# Patient Record
Sex: Male | Born: 1980 | Race: White | Hispanic: No | Marital: Married | Smoking: Never smoker
Health system: Southern US, Community
[De-identification: ages and names within clinical notes are randomized; demographics above are authoritative.]

## PROBLEM LIST (undated history)

## (undated) DIAGNOSIS — E079 Disorder of thyroid, unspecified: Secondary | ICD-10-CM

## (undated) DIAGNOSIS — C9201 Acute myeloblastic leukemia, in remission: Secondary | ICD-10-CM

## (undated) DIAGNOSIS — F419 Anxiety disorder, unspecified: Secondary | ICD-10-CM

## (undated) DIAGNOSIS — Z5189 Encounter for other specified aftercare: Secondary | ICD-10-CM

## (undated) DIAGNOSIS — E039 Hypothyroidism, unspecified: Secondary | ICD-10-CM

## (undated) HISTORY — DX: Acute myeloblastic leukemia, in remission: C92.01

## (undated) HISTORY — DX: Anxiety disorder, unspecified: F41.9

## (undated) HISTORY — PX: PORT-A-CATH REMOVAL: SHX5289

## (undated) HISTORY — DX: Disorder of thyroid, unspecified: E07.9

## (undated) HISTORY — PX: WISDOM TOOTH EXTRACTION: SHX21

## (undated) HISTORY — DX: Encounter for other specified aftercare: Z51.89

## (undated) HISTORY — PX: CYSTECTOMY: SUR359

---

## 2008-11-25 DIAGNOSIS — Z5189 Encounter for other specified aftercare: Secondary | ICD-10-CM

## 2008-11-25 DIAGNOSIS — C9201 Acute myeloblastic leukemia, in remission: Secondary | ICD-10-CM

## 2008-11-25 HISTORY — DX: Encounter for other specified aftercare: Z51.89

## 2008-11-25 HISTORY — DX: Acute myeloblastic leukemia, in remission: C92.01

## 2011-10-14 DIAGNOSIS — E538 Deficiency of other specified B group vitamins: Secondary | ICD-10-CM | POA: Insufficient documentation

## 2015-05-16 DIAGNOSIS — E039 Hypothyroidism, unspecified: Secondary | ICD-10-CM | POA: Insufficient documentation

## 2015-05-16 DIAGNOSIS — C9201 Acute myeloblastic leukemia, in remission: Secondary | ICD-10-CM | POA: Insufficient documentation

## 2015-05-16 LAB — BASIC METABOLIC PANEL
BUN: 161 — AB (ref 4–21)
GLUCOSE: 85
POTASSIUM: 4.4 (ref 3.4–5.3)
SODIUM: 140 (ref 137–147)

## 2015-05-16 LAB — CBC AND DIFFERENTIAL
HCT: 51 (ref 41–53)
Hemoglobin: 16.7 (ref 13.5–17.5)
Neutrophils Absolute: 3
PLATELETS: 178 (ref 150–399)
WBC: 6.4

## 2015-05-16 LAB — HEPATIC FUNCTION PANEL
ALT: 18 (ref 10–40)
AST: 19 (ref 14–40)
Alkaline Phosphatase: 59 (ref 25–125)
Bilirubin, Total: 0.6

## 2015-05-16 LAB — TSH: TSH: 5.11 (ref 0.41–5.90)

## 2015-06-05 LAB — VITAMIN D 25 HYDROXY (VIT D DEFICIENCY, FRACTURES): VIT D 25 HYDROXY: 24.1

## 2016-12-02 LAB — VITAMIN B12: VITAMIN B 12: 739

## 2016-12-02 LAB — CBC AND DIFFERENTIAL
HCT: 48 (ref 41–53)
HEMOGLOBIN: 16.8 (ref 13.5–17.5)
NEUTROS ABS: 2
PLATELETS: 189 (ref 150–399)
WBC: 5

## 2016-12-02 LAB — BASIC METABOLIC PANEL
BUN: 15 (ref 4–21)
Creatinine: 0.8 (ref 0.6–1.3)
POTASSIUM: 4.4 (ref 3.4–5.3)
Sodium: 145 (ref 137–147)

## 2016-12-02 LAB — HEPATIC FUNCTION PANEL
ALT: 26 (ref 10–40)
AST: 23 (ref 14–40)
Alkaline Phosphatase: 63 (ref 25–125)
Bilirubin, Total: 0.5

## 2016-12-02 LAB — TSH: TSH: 8.06 — AB (ref 0.41–5.90)

## 2017-05-22 ENCOUNTER — Ambulatory Visit (INDEPENDENT_AMBULATORY_CARE_PROVIDER_SITE_OTHER): Payer: BLUE CROSS/BLUE SHIELD | Admitting: Physician Assistant

## 2017-05-22 ENCOUNTER — Encounter: Payer: Self-pay | Admitting: Physician Assistant

## 2017-05-22 VITALS — BP 118/80 | HR 63 | Temp 98.4°F | Ht 67.5 in | Wt 206.2 lb

## 2017-05-22 DIAGNOSIS — G8929 Other chronic pain: Secondary | ICD-10-CM | POA: Diagnosis not present

## 2017-05-22 DIAGNOSIS — R1012 Left upper quadrant pain: Secondary | ICD-10-CM | POA: Diagnosis not present

## 2017-05-22 LAB — COMPREHENSIVE METABOLIC PANEL
ALT: 16 U/L (ref 0–53)
AST: 16 U/L (ref 0–37)
Albumin: 4.7 g/dL (ref 3.5–5.2)
Alkaline Phosphatase: 59 U/L (ref 39–117)
BUN: 15 mg/dL (ref 6–23)
CHLORIDE: 102 meq/L (ref 96–112)
CO2: 29 meq/L (ref 19–32)
Calcium: 10 mg/dL (ref 8.4–10.5)
Creatinine, Ser: 0.88 mg/dL (ref 0.40–1.50)
GFR: 104.74 mL/min (ref >60.00)
Glucose, Bld: 95 mg/dL (ref 70–99)
POTASSIUM: 3.7 meq/L (ref 3.5–5.1)
Sodium: 140 mEq/L (ref 135–145)
Total Bilirubin: 0.7 mg/dL (ref 0.2–1.2)
Total Protein: 7.2 g/dL (ref 6.0–8.3)

## 2017-05-22 LAB — CBC WITH DIFFERENTIAL/PLATELET
Basophils Absolute: 0 10*3/uL (ref 0.0–0.1)
Basophils Relative: 0.8 % (ref 0.0–3.0)
EOS PCT: 0.9 % (ref 0.0–5.0)
Eosinophils Absolute: 0.1 10*3/uL (ref 0.0–0.7)
HCT: 48.2 % (ref 39.0–52.0)
Hemoglobin: 16.8 g/dL (ref 13.0–17.0)
LYMPHS ABS: 2 10*3/uL (ref 0.7–4.0)
Lymphocytes Relative: 34.6 % (ref 12.0–46.0)
MCHC: 34.9 g/dL (ref 30.0–36.0)
MCV: 91.7 fl (ref 78.0–100.0)
MONOS PCT: 12.1 % — AB (ref 3.0–12.0)
Monocytes Absolute: 0.7 10*3/uL (ref 0.1–1.0)
NEUTROS ABS: 3 10*3/uL (ref 1.4–7.7)
NEUTROS PCT: 51.6 % (ref 43.0–77.0)
PLATELETS: 207 10*3/uL (ref 150.0–400.0)
RBC: 5.25 Mil/uL (ref 4.22–5.81)
RDW: 12.7 % (ref 11.5–15.5)
WBC: 5.7 10*3/uL (ref 4.0–10.5)

## 2017-05-22 LAB — SEDIMENTATION RATE: Sed Rate: 1 mm/hr (ref 0–15)

## 2017-05-22 LAB — C-REACTIVE PROTEIN: CRP: 0.4 mg/dL — ABNORMAL LOW (ref 0.5–20.0)

## 2017-05-22 NOTE — Progress Notes (Signed)
Zachary Little is a 36 y.o. male here to [place] and complaining of pain LLQ x 4 months.  I acted as a Education administrator for [employer], PA-C Anselmo Pickler, LPN   History of Present Illness:   Chief Complaint  Patient presents with  . Establish Care    BC/BS  . Pain LUQ    radiates to the back, x 4 months    [name] is here to establish care and discuss his LUQ pain. 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  Acute Concerns: LUQ pain -- states that his pain is "purely positional", he states that he if lays on his L side or reclines to a certain degree he has pain to his lower L rib cage and in his LUQ. The pain radiates to his back at times. Normal bowel movements, denies issues with constipation or diarrhea. No history of liver or gallbladder issues. No issues with eating or severe pain after meals, denies nausea. Denies any issues with urination, fevers or or night sweats. Current wt is 206 lb, has been jogging to lose weight and has lost about 11 lb so far this year -- getting married in September. He does not use alcohol or do any illicit drugs.  Of note, he has been seen by multiple providers for this including: Dr. Kennon Holter with [facility] and Lanier Prude PA-C at [facility]. He first noticed this pain when he was driving, and of note, he is L-handed. Denies numbness, tingling, bowel/bladder incontinence, saddle anesthesia. Work-up this far includes: negative CT of the abdomen and pelvis without contrast on 01/17/17. Thoracic spine x-rays on 03/27/17 was negative for acute issue, does show scoliosis at T5. He was ultimately diagnosed with thoracic radiculopathy and started on Meloxicam. He was told to follow-up in about 4 weeks from last visit which was on 04/15/17, and given that his pain was in a primarily dermatomal patterns, his other providers were considering an MRI of the thoracic spine.    He is here for another opinion. Most recent blood work per his report was in January 2018, prior to onset of pain.  Wt Readings from Last 3 Encounters:  05/22/17 206 lb 4 oz (93.6 kg)   Chronic Issues: Meralgia paresthetica of R side -- s/p treatment with right LFC block as well as PT. Has had both a cervical and lumbar MRI in August of 2017. 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 No stenosis or disc degeneration.   Health Maintenance: Weight -- Weight: 206 lb 4 oz (93.6 kg)   No flowsheet data found.  GAD 7 : Generalized Anxiety Score 05/22/2017  Nervous, Anxious, on Edge 0  Control/stop worrying 0  Worry too much - different things 0  Trouble relaxing 0  Restless 0  Easily annoyed or irritable 0  Afraid - awful might happen 0  Total GAD 7 Score 0  Anxiety Difficulty Not difficult at all   Other providers/specialists: Dr. Park Breed and Nani Skillern, PA-C -- [facility] Dr. Butch Penny -- [facility] Dr. Lucia Gaskins -- Oncology  Past Medical History:  Diagnosis Date  . Anxiety   . Blood transfusion without reported diagnosis 2010   During Chemo Treatments  . Leukemia, acute myeloid, in remission ([place]) 2010  . Thyroid disease      Social History   Social History  .  Marital status: Single    Spouse name: N/A  . Number of children: N/A  . Years of education: N/A   Occupational History  . Not on file.   Social History Main Topics  . Smoking status: Never Smoker  . Smokeless tobacco: Never Used  . Alcohol use No  . Drug use: No  . Sexual activity: Yes    Partners: Female   Other Topics Concern  . 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 No pertinent surgical history.  Family History  Problem Relation Age of Onset  . Diabetes Maternal Grandfather   . Diabetes Paternal  Grandfather     No Known Allergies   Current Medications:   Current Outpatient Prescriptions:  .  levothyroxine (SYNTHROID, LEVOTHROID) 200 MCG tablet, Take 200 mcg by mouth daily. , Disp: , Rfl:    Review of Systems:   Review of Systems  Constitutional: Negative for chills, fever, malaise/fatigue and weight loss.  HENT: Negative for hearing loss, sinus pain and sore throat.   Eyes: Negative for blurred vision.  Respiratory: Negative for cough and shortness of breath.   Cardiovascular: Negative for chest pain, palpitations and leg swelling.  Gastrointestinal: Negative for abdominal pain, constipation, diarrhea, heartburn, nausea and vomiting.  Genitourinary: Negative for dysuria, frequency and urgency.  Musculoskeletal: Positive for myalgias. Negative for back pain and neck pain.       LUQ radiates to the back, x 4 months.  Skin: Negative for itching and rash.  Neurological: Negative for dizziness, tingling, seizures, loss of consciousness and headaches.  Endo/Heme/Allergies: Negative for polydipsia.  Psychiatric/Behavioral: Negative for depression. The patient is not nervous/anxious.     Vitals:   Vitals:   05/22/17 1337  BP: 118/80  Pulse: 63  Temp: 98.4 F (36.9 C)  TempSrc: Oral  SpO2: 98%  Weight: 206 lb 4 oz (93.6 kg)  Height: 5' 7.5" (1.715 m)     Body mass index is 31.83 kg/m.  Physical Exam:   Physical Exam  Constitutional: He appears well-developed. He is cooperative.  Non-toxic appearance. He does not have a sickly appearance. He does not appear ill. No distress.  Cardiovascular: Normal rate, regular rhythm, S1 normal, S2 normal, normal heart sounds and normal pulses.   No LE edema  Pulmonary/Chest: Effort normal and breath sounds normal.  Abdominal: Normal appearance and bowel sounds are normal. There is no splenomegaly. There is no tenderness. There is no rigidity, no rebound, no guarding, no CVA tenderness and negative Murphy's sign.  Unable to  reproduce symptoms with deep palpation of LUQ  Musculoskeletal:       Thoracic back: He exhibits normal range of motion, no tenderness, no bony tenderness, no swelling, no edema, no deformity, no laceration, no pain and no spasm.  No rashes.  Neurological: He is alert.  Nursing note and vitals reviewed.  Results for orders placed or performed in visit on 05/22/17  CBC with Differential/Platelet  Result Value Ref Range   WBC 5.7 4.0 - 10.5 K/uL   RBC 5.25 4.22 - 5.81 Mil/uL   Hemoglobin 16.8 13.0 - 17.0 g/dL   HCT 48.2 39.0 - 52.0 %   MCV 91.7 78.0 - 100.0 fl   MCHC 34.9 30.0 - 36.0 g/dL   RDW 12.7 11.5 - 15.5 %   Platelets 207.0 150.0 - 400.0 K/uL  Neutrophils Relative % 51.6 43.0 - 77.0 %   Lymphocytes Relative 34.6 12.0 - 46.0 %   Monocytes Relative 12.1 (H) 3.0 - 12.0 %   Eosinophils Relative 0.9 0.0 - 5.0 %   Basophils Relative 0.8 0.0 - 3.0 %   Neutro Abs 3.0 1.4 - 7.7 K/uL   Lymphs Abs 2.0 0.7 - 4.0 K/uL   Monocytes Absolute 0.7 0.1 - 1.0 K/uL   Eosinophils Absolute 0.1 0.0 - 0.7 K/uL   Basophils Absolute 0.0 0.0 - 0.1 K/uL  Comprehensive metabolic panel  Result Value Ref Range   Sodium 140 135 - 145 mEq/L   Potassium 3.7 3.5 - 5.1 mEq/L   Chloride 102 96 - 112 mEq/L   CO2 29 19 - 32 mEq/L   Glucose, Bld 95 70 - 99 mg/dL   BUN 15 6 - 23 mg/dL   Creatinine, Ser 0.88 0.40 - 1.50 mg/dL   Total Bilirubin 0.7 0.2 - 1.2 mg/dL   Alkaline Phosphatase 59 39 - 117 U/L   AST 16 0 - 37 U/L   ALT 16 0 - 53 U/L   Total Protein 7.2 6.0 - 8.3 g/dL   Albumin 4.7 3.5 - 5.2 g/dL   Calcium 10.0 8.4 - 10.5 mg/dL   GFR 104.74 >60.00 mL/min  Sedimentation rate  Result Value Ref Range   Sed Rate 1 0 - 15 mm/hr  C-reactive protein  Result Value Ref Range   CRP 0.4 (L) 0.5 - 20.0 mg/dL    Assessment and Plan:    Thunder was seen today for establish care and pain luq.  Diagnoses and all orders for this visit:  Chronic LUQ pain Patient has been diagnosed by prior providers with  thoracic radiculopathy, however he is seeking a second opinion. I reviewed patient's records extensively and discussed case with Dr. Teresa Coombs in sports medicine here at our office. Given all of the imaging that has been completed so far, as well as patient requesting a referral to neurology for a second opinion on his meralgia paresthetica of his R side, I am going to send him to neurology for further evaluation. Most recent provider, Dr. [place] Desanctis, recommended the next step possibly be an MRI of thoracic spine. I will hold off on this for now and defer to neurology. I called patient after he left and after I reviewed his records to discuss this plan with patient, to which he is agreeable. I also reviewed his lab results with him and told him that they were normal. Patient appreciative of information and referral has been placed. -     CBC with Differential/Platelet -     Comprehensive metabolic panel -     Sedimentation rate -     C-reactive protein -     Pathologist smear review    . Reviewed expectations re: course of current medical issues. . Discussed self-management of symptoms. . Outlined signs and symptoms indicating need for more acute intervention. . Patient verbalized understanding and all questions were answered. . See orders for this visit as documented in the electronic medical record. . Patient received an After-Visit Summary.  CMA or LPN served as scribe during this visit. History, Physical, and Plan performed by medical provider. Documentation and orders reviewed and attested to.  [name], PA-C

## 2017-05-22 NOTE — Patient Instructions (Addendum)
It was great meeting you!  You will be contacted about your referral to neurology and to schedule your ultrasound. We will also contact you about your lab results.   Abdominal Pain, Adult Abdominal pain can be caused by many things. Often, abdominal pain is not serious and it gets better with no treatment or by being treated at home. However, sometimes abdominal pain is serious. Your health care provider will do a medical history and a physical exam to try to determine the cause of your abdominal pain. Follow these instructions at home:  Take over-the-counter and prescription medicines only as told by your health care provider. Do not take a laxative unless told by your health care provider.  Drink enough fluid to keep your urine clear or pale yellow.  Watch your condition for any changes.  Keep all follow-up visits as told by your health care provider. This is important. Contact a health care provider if:  Your abdominal pain changes or gets worse.  You are not hungry or you lose weight without trying.  You are constipated or have diarrhea for more than 2-3 days.  You have pain when you urinate or have a bowel movement.  Your abdominal pain wakes you up at night.  Your pain gets worse with meals, after eating, or with certain foods.  You are throwing up and cannot keep anything down.  You have a fever. Get help right away if:  Your pain does not go away as soon as your health care provider told you to expect.  You cannot stop throwing up.  Your pain is only in areas of the abdomen, such as the right side or the left lower portion of the abdomen.  You have bloody or black stools, or stools that look like tar.  You have severe pain, cramping, or bloating in your abdomen.  You have signs of dehydration, such as: ? Dark urine, very little urine, or no urine. ? Cracked lips. ? Dry mouth. ? Sunken eyes. ? Sleepiness. ? Weakness. This information is not intended to  replace advice given to you by your health care provider. Make sure you discuss any questions you have with your health care provider. Document Released: 08/21/2005 Document Revised: 05/31/2016 Document Reviewed: 04/24/2016 Elsevier Interactive Patient Education  2017 [employer].

## 2017-05-23 ENCOUNTER — Other Ambulatory Visit: Payer: Self-pay | Admitting: Physician Assistant

## 2017-05-23 DIAGNOSIS — G5711 Meralgia paresthetica, right lower limb: Secondary | ICD-10-CM | POA: Insufficient documentation

## 2017-05-23 DIAGNOSIS — M5414 Radiculopathy, thoracic region: Secondary | ICD-10-CM | POA: Insufficient documentation

## 2017-05-23 LAB — PATHOLOGIST SMEAR REVIEW

## 2017-05-27 ENCOUNTER — Encounter: Payer: Self-pay | Admitting: Neurology

## 2017-06-04 ENCOUNTER — Telehealth: Payer: Self-pay | Admitting: Family Medicine

## 2017-06-04 NOTE — Telephone Encounter (Signed)
ROI fax to [facility]

## 2017-06-23 ENCOUNTER — Encounter: Payer: Self-pay | Admitting: Physician Assistant

## 2017-06-23 LAB — SEDIMENTATION RATE
ANA by IFA: 1:160 {titer}
Sed Rate: 7
Total CK: 75 (ref ?–195.0)

## 2017-06-23 LAB — T3, FREE
FREE T3: 2.83
Free T4: 1.45

## 2017-07-31 ENCOUNTER — Ambulatory Visit (INDEPENDENT_AMBULATORY_CARE_PROVIDER_SITE_OTHER): Payer: BLUE CROSS/BLUE SHIELD | Admitting: Neurology

## 2017-07-31 ENCOUNTER — Encounter: Payer: Self-pay | Admitting: Neurology

## 2017-07-31 VITALS — BP 100/58 | HR 83 | Ht 67.5 in | Wt 204.0 lb

## 2017-07-31 DIAGNOSIS — G8929 Other chronic pain: Secondary | ICD-10-CM | POA: Diagnosis not present

## 2017-07-31 DIAGNOSIS — M546 Pain in thoracic spine: Secondary | ICD-10-CM

## 2017-07-31 DIAGNOSIS — G5711 Meralgia paresthetica, right lower limb: Secondary | ICD-10-CM

## 2017-07-31 MED ORDER — GABAPENTIN 300 MG PO CAPS: 300.0000 mg | ORAL_CAPSULE | Freq: Three times a day (TID) | ORAL | 2 refills | Status: DC

## 2017-07-31 NOTE — Addendum Note (Signed)
Addended by: Akiah Bauch R on: 07/31/2017 04:00 PM   Modules accepted: Level of Service

## 2017-07-31 NOTE — Patient Instructions (Signed)
1.  Restart gabapentin 300mg :  Take 1 capsule at bedtime for 7 days,  Then 1 capsule twice daily for 7 days  Then 1 capsule three times daily Contact me at end of prescription and we can either increase dose or try a different medication.  We can also consider ordering MRI of thoracic spine. 2.  Follow up in 3 months.

## 2017-07-31 NOTE — Progress Notes (Signed)
NEUROLOGY CONSULTATION NOTE  Zachary Little MRN: [id number] DOB: Nov 22, 1981  Referring provider: [name], PA Primary care provider: [name], PA  Reason for consult:  Thoracic radiculopathy  HISTORY OF PRESENT ILLNESS: Zachary Little is a 36 year old left-handed male with hypothyroidism and history of AML in remission who presents for evaluation of right meralgia paresthetica and left thoracic radiculopathy.  History supplemented by PCP and prior specialists' notes.   In 2016, he developed numbness and tingling in the lateral aspect of his right thigh.  It is constant but he particularly notices it when he lays supine in bed or is standing on uneven ground.  He denies low back pain or radicular pain down the leg.  He denies weakness of the extremity.  He denies symptoms in the left leg.  He denies bladder incontinence or retention, although he has increased urgency.   X-ray of right thigh/femur from 06/05/15 was unremarkable.  Lower extremity venous doppler from 06/05/15 was negative for DVT.  Lumbar spine Xrays from 02/06/16 were negative.  Labs from 03/27/16 included TSH 4.680, free T4 1.46, B12 291, CK 66, vitamin D 56.6, A NCV-EMG from 06/24/16 was reportedly normal.  MRI of lumbar spine without contrast from 07/09/16 was normal. He was diagnosed with right sided meralgia paresthetica and underwent a right lateral femoral cutaneous nerve block on 01/30/17, which was ineffective.  He denies wearing tight fitting belts or pants at the waist.    In February, he developed sudden onset left sided thoracic pain involving the left flank and rib.  It was spontaneous and not preceded by any injury or strenuous activity.  It occurs from the medial lower border of the scapula and radiates around to above the inguinal region.  It is a moderate pain, but it is not a sharp, stabbing, electric or burning pain.  There is no associated paresthesias or rash.  It is noticeable when sitting at his  desk or driving in the car.  When he is feeling the pain, it can be intensified if he palpates below his rib cage.  Otherwise, it is not reproducible.    CT of abdomen and pelvis from 01/17/17 was unremarkable.  A thoracic spine and bilateral ribs/chest X-rays from 03/27/17 were unremarkable.    He was diagnosed with possible left sided thoracic radiculopathy.  Labs from 05/22/17 were unremarkable, including CBC, CMP, Sed rate of 1 and CRP 0.4.  PAST MEDICAL HISTORY: Past Medical History:  Diagnosis Date  . Anxiety   . Blood transfusion without reported diagnosis 2010   During Chemo Treatments  . Leukemia, acute myeloid, in remission ([place]) 2010  . Thyroid disease     PAST SURGICAL HISTORY: History reviewed. No pertinent surgical history.  MEDICATIONS: Current Outpatient Prescriptions on File Prior to Visit  Medication Sig Dispense Refill  . levothyroxine (SYNTHROID, LEVOTHROID) 200 MCG tablet Take 200 mcg by mouth daily.      No current facility-administered medications on file prior to visit.     ALLERGIES: No Known Allergies  FAMILY HISTORY: Family History  Problem Relation Age of Onset  . Diabetes Maternal Grandfather   . Diabetes Paternal Grandfather     SOCIAL HISTORY: Social History   Social History  . Marital status: Single    Spouse name: N/A  . Number of children: N/A  . Years of education: N/A   Occupational History  . Not on file.   Social History Main Topics  . Smoking status: Never Smoker  .  Smokeless tobacco: Never Used  . Alcohol use No  . Drug use: No  . Sexual activity: Yes    Partners: Female   Other Topics Concern  . 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   Married in September, no kids   No pets    REVIEW OF SYSTEMS: Constitutional: No fevers, chills, or sweats, no generalized fatigue, change in appetite Eyes: No visual changes, double vision, eye pain Ear, nose and throat: No hearing loss, ear  pain, nasal congestion, sore throat Cardiovascular: No chest pain, palpitations Respiratory:  No shortness of breath at rest or with exertion, wheezes GastrointestinaI: No nausea, vomiting, diarrhea, abdominal pain, fecal incontinence Genitourinary:  No dysuria, urinary retention or frequency Musculoskeletal:  No neck pain, back pain Integumentary: No rash, pruritus, skin lesions Neurological: as above Psychiatric: No depression, insomnia, anxiety Endocrine: No palpitations, fatigue, diaphoresis, mood swings, change in appetite, change in weight, increased thirst Hematologic/Lymphatic:  No purpura, petechiae. Allergic/Immunologic: no itchy/runny eyes, nasal congestion, recent allergic reactions, rashes  PHYSICAL EXAM: Vitals:   07/31/17 1453  BP: (!) 100/58  Pulse: 83   General: No acute distress.  Patient appears well-groomed.  Head:  Normocephalic/atraumatic Eyes:  fundi examined but not visualized Neck: supple, no paraspinal tenderness, full range of motion Back: No paraspinal tenderness Heart: regular rate and rhythm Lungs: Clear to auscultation bilaterally. Vascular: No carotid bruits. Neurological Exam: Mental status: alert and oriented to person, place, and time, recent and remote memory intact, fund of knowledge intact, attention and concentration intact, speech fluent and not dysarthric, language intact. Cranial nerves: CN I: not tested CN II: pupils equal, round and reactive to light, visual fields intact CN III, IV, VI:  full range of motion, no nystagmus, no ptosis CN V: facial sensation intact CN VII: upper and lower face symmetric CN VIII: hearing intact CN IX, X: gag intact, uvula midline CN XI: sternocleidomastoid and trapezius muscles intact CN XII: tongue midline Bulk & Tone: normal, no fasciculations. Motor:  5/5 throughout  Sensation:  Decreased pinprick sensation along the lateral aspect of the right thigh; vibration sensation intact.  . Deep Tendon  Reflexes:  2+ throughout, toes downgoing.  Finger to nose testing:  Without dysmetria.  Heel to shin:  Without dysmetria.  Gait:  Normal station and stride.  Able to turn and tandem walk. Romberg negative.  IMPRESSION: 1.  Right-sided meralgia paresthetica.  No structural cause identified on imaging.  Symptoms and imaging not suggestive of lumbar radiculopathy.  Treatment is symptomatic management as there is no correctable cause found on imaging. 2.  Left sided thoracic pain.  Pain description is vague.  T6 radiculopathy is possible, however X-ray was unremarkable.  PLAN: 1.  He tried gabapentin once before (300mg  three times daily), which caused side effects so he stopped after 2 weeks.  We will restart it at a lower dose and titrate up slowly from once at bedtime to three times daily. 2.  In 4 weeks, he will contact us with update and we can either further titrate if needed or switch to a different agent (Lyrica) and consider ordering MRI of thoracic spine to evaluate for structural etiology. 3.  Follow up in 2 to 3 months.  Thank you for allowing me to take part in the care of this patient.  Metta Clines, DO  CC:  [name], PA

## 2017-08-22 ENCOUNTER — Ambulatory Visit (INDEPENDENT_AMBULATORY_CARE_PROVIDER_SITE_OTHER): Payer: 59 | Admitting: Physician Assistant

## 2017-08-22 ENCOUNTER — Encounter: Payer: Self-pay | Admitting: Physician Assistant

## 2017-08-22 VITALS — BP 98/68 | HR 72 | Temp 98.1°F | Wt 208.0 lb

## 2017-08-22 DIAGNOSIS — S161XXA Strain of muscle, fascia and tendon at neck level, initial encounter: Secondary | ICD-10-CM

## 2017-08-22 DIAGNOSIS — Z23 Encounter for immunization: Secondary | ICD-10-CM

## 2017-08-22 MED ORDER — MELOXICAM 15 MG PO TABS: 15.0000 mg | ORAL_TABLET | Freq: Every day | ORAL | 0 refills | Status: DC

## 2017-08-22 NOTE — Patient Instructions (Signed)
Work on exercises that I have provided you.  Consider dry needling with physical therapy or an appointment with Dr. Paulla Fore here at [facility] for further evaluation if symptoms do not improve.

## 2017-08-22 NOTE — Progress Notes (Signed)
Zachary Little is a 36 y.o. male here for a new problem.   History of Present Illness:   Chief Complaint  Patient presents with  . Neck Pain    X1week     HPI   Neck pain -- patient reports that for the past 1 week he has had stiffness and soreness around his shoulders, that has been radiating up to his neck and sometimes causing headaches. The pain is most present when driving and after waking up. He works on a Psychiatric nurse day and doesn't have reproducible symptoms when at his computer. Denies changes in vision, severe headaches, weakness along one side of the body.  Has tried ibuprofen, already taking gabapentin TID.   Past Medical History:  Diagnosis Date  . Anxiety   . Blood transfusion without reported diagnosis 2010   During Chemo Treatments  . Leukemia, acute myeloid, in remission ([place]) 2010  . Thyroid disease      Social History   Social History  . Marital status: Single    Spouse name: N/A  . Number of children: N/A  . Years of education: N/A   Occupational History  . Not on file.   Social History Main Topics  . Smoking status: Never Smoker  . Smokeless tobacco: Never Used  . Alcohol use No  . Drug use: No  . Sexual activity: Yes    Partners: Female   Other Topics Concern  . 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  Family History  Problem Relation Age of Onset  . Diabetes Maternal Grandfather   . Diabetes Paternal Grandfather     No Known Allergies  Current Medications:   Current Outpatient Prescriptions:  .  gabapentin (NEURONTIN) 300 MG capsule, Take 1 capsule (300 mg total) by mouth 3 (three) times daily., Disp: 90 capsule, Rfl: 2 .  levothyroxine (SYNTHROID, LEVOTHROID) 200 MCG tablet, Take 200 mcg by mouth daily. , Disp: , Rfl:  .  meloxicam (MOBIC) 15 MG tablet, Take 1 tablet (15 mg total) by mouth daily., Disp:  30 tablet, Rfl: 0   Review of Systems:   Review of Systems  Constitutional: Negative for chills, fever, malaise/fatigue and weight loss.  Respiratory: Negative for shortness of breath.   Cardiovascular: Negative for chest pain, orthopnea, claudication and leg swelling.  Gastrointestinal: Negative for heartburn, nausea and vomiting.  Musculoskeletal: Positive for neck pain.  Neurological: Negative for dizziness, tingling and headaches.    Vitals:   Vitals:   08/22/17 1132  BP: 98/68  Pulse: 72  Temp: 98.1 F (36.7 C)  TempSrc: Oral  SpO2: 98%  Weight: 208 lb (94.3 kg)     Body mass index is 32.1 kg/m.  Physical Exam:   Physical Exam  Constitutional: He appears well-developed. He is cooperative.  Non-toxic appearance. He does not have a sickly appearance. He does not appear ill. No distress.  Cardiovascular: Normal rate, regular rhythm, S1 normal, S2 normal, normal heart sounds and normal pulses.   No LE edema  Pulmonary/Chest: Effort normal and breath sounds normal.  Musculoskeletal:  Decreased ROM with lateral neck movements 2/2 pain. No decreased ROM with rotation or flexion/extension. No rashes present. Tenderness to deep palpation to bilateral paraspinal cervical muscles. No bony tenderness.   Neurological: He is alert. He has normal strength. No cranial  nerve deficit or sensory deficit. GCS eye subscore is 4. GCS verbal subscore is 5. GCS motor subscore is 6.  Skin: Skin is warm, dry and intact.  Psychiatric: He has a normal mood and affect. His speech is normal and behavior is normal.  Pleasant  Nursing note and vitals reviewed.   Assessment and Plan:    Zachary Little was seen today for neck pain.  Diagnoses and all orders for this visit:  Strain of neck muscle, initial encounter Suspect cervical muscle strain. No red flags on exam. We discussed use of muscle relaxants, however he reports that he has been on flexeril in the past and it made him very sleepy, so he would  like to avoid this at this time. Start Mobic daily. I have also given him cervical exercises for him to do at home. Follow-up with Dr. Teresa Coombs or consider dry needling in trapezius muscles. Patient agreeable to plan.   Need for influenza vaccination -     Flu Vaccine QUAD 6+ mos PF IM (Fluarix Quad PF)  Other orders -     meloxicam (MOBIC) 15 MG tablet; Take 1 tablet (15 mg total) by mouth daily.    . Reviewed expectations re: course of current medical issues. . Discussed self-management of symptoms. . Outlined signs and symptoms indicating need for more acute intervention. . Patient verbalized understanding and all questions were answered. . See orders for this visit as documented in the electronic medical record. . Patient received an After-Visit Summary.  CMA or LPN served as scribe during this visit. History, Physical, and Plan performed by medical provider. Documentation and orders reviewed and attested to.  [name], PA-C

## 2017-08-25 ENCOUNTER — Telehealth: Payer: Self-pay | Admitting: *Deleted

## 2017-08-25 ENCOUNTER — Telehealth: Payer: Self-pay | Admitting: Physician Assistant

## 2017-08-25 NOTE — Telephone Encounter (Signed)
Patient called to schedule an appt with [name]. Patient states he was experiencing some Depression. Patient states this was something new. Transferred pt to [facility] . Awaiting response. Thank you

## 2017-08-25 NOTE — Telephone Encounter (Signed)
Patient Name: Zachary Little  DOB: July 30, 1981    Initial Comment Caller states he has depression and the office wanted him to speak to a triage nurse so he can be seen sooner.    Nurse Assessment  Nurse: Joline Salt, RN, [name] Date/Time (Eastern Time): 08/25/2017 1:00:17 PM  Confirm and document reason for call. If symptomatic, describe symptoms. ---Caller states he has depression and the office wanted him to speak to a triage nurse so he can be seen sooner.  Does the patient have any new or worsening symptoms? ---Yes  Will a triage be completed? ---Yes  Related visit to physician within the last 2 weeks? ---N/A  Does the PT have any chronic conditions? (i.e. diabetes, asthma, etc.) ---No  Is this a behavioral health or substance abuse call? ---No     Guidelines    Guideline Title Affirmed Question Affirmed Notes  Depression Requesting to talk with a counselor (mental health worker, psychiatrist, etc.)    Final Disposition User   See PCP When Office is Open (within 3 days) Joline Salt, Therapist, sports, [name]    Comments  Warm transferred to MD office for appointment withing the next 3 days.   Referrals  REFERRED TO PCP OFFICE   Caller Disagree/Comply Comply  Caller Understands Yes  PreDisposition Call Doctor

## 2017-08-25 NOTE — Telephone Encounter (Signed)
Noted, pt scheduled tomorrow to see Aldona Bar.

## 2017-08-25 NOTE — Telephone Encounter (Signed)
Pt scheduled to see [facility] tomorrow.

## 2017-08-26 ENCOUNTER — Encounter: Payer: Self-pay | Admitting: Physician Assistant

## 2017-08-26 ENCOUNTER — Ambulatory Visit (INDEPENDENT_AMBULATORY_CARE_PROVIDER_SITE_OTHER): Payer: 59 | Admitting: Physician Assistant

## 2017-08-26 VITALS — BP 110/72 | HR 73 | Temp 98.2°F | Ht 67.5 in | Wt 209.0 lb

## 2017-08-26 DIAGNOSIS — N529 Male erectile dysfunction, unspecified: Secondary | ICD-10-CM | POA: Diagnosis not present

## 2017-08-26 DIAGNOSIS — E039 Hypothyroidism, unspecified: Secondary | ICD-10-CM

## 2017-08-26 DIAGNOSIS — F320 Major depressive disorder, single episode, mild: Secondary | ICD-10-CM

## 2017-08-26 DIAGNOSIS — D229 Melanocytic nevi, unspecified: Secondary | ICD-10-CM

## 2017-08-26 DIAGNOSIS — Z114 Encounter for screening for human immunodeficiency virus [HIV]: Secondary | ICD-10-CM | POA: Diagnosis not present

## 2017-08-26 LAB — CBC WITH DIFFERENTIAL/PLATELET
BASOS PCT: 0.9 % (ref 0.0–3.0)
Basophils Absolute: 0 10*3/uL (ref 0.0–0.1)
EOS PCT: 2.1 % (ref 0.0–5.0)
Eosinophils Absolute: 0.1 10*3/uL (ref 0.0–0.7)
HCT: 47.2 % (ref 39.0–52.0)
Hemoglobin: 16.2 g/dL (ref 13.0–17.0)
LYMPHS PCT: 38.4 % (ref 12.0–46.0)
Lymphs Abs: 1.8 10*3/uL (ref 0.7–4.0)
MCHC: 34.3 g/dL (ref 30.0–36.0)
MCV: 92.9 fl (ref 78.0–100.0)
MONOS PCT: 13.6 % — AB (ref 3.0–12.0)
Monocytes Absolute: 0.6 10*3/uL (ref 0.1–1.0)
NEUTROS PCT: 45 % (ref 43.0–77.0)
Neutro Abs: 2.1 10*3/uL (ref 1.4–7.7)
PLATELETS: 190 10*3/uL (ref 150.0–400.0)
RBC: 5.07 Mil/uL (ref 4.22–5.81)
RDW: 13.4 % (ref 11.5–15.5)
WBC: 4.6 10*3/uL (ref 4.0–10.5)

## 2017-08-26 LAB — TSH: TSH: 0.03 u[IU]/mL — AB (ref 0.35–4.50)

## 2017-08-26 LAB — T4, FREE: Free T4: 1.52 ng/dL (ref 0.60–1.60)

## 2017-08-26 MED ORDER — TADALAFIL 10 MG PO TABS: 10.0000 mg | ORAL_TABLET | Freq: Every day | ORAL | 1 refills | Status: DC | PRN

## 2017-08-26 MED ORDER — TADALAFIL 10 MG PO TABS: 10.0000 mg | ORAL_TABLET | Freq: Every day | ORAL | 0 refills | Status: DC | PRN

## 2017-08-26 NOTE — Patient Instructions (Signed)
It was great to see you!  Please contact [facility] to request therapy.  Trey Paula is here in our office if you would like to see her.  We will contact you with your lab results.  You will be contacted about your referral to dermatology.  If you ever develop any thoughts of suicide, please tell someone immediately and go to the hospital.

## 2017-08-26 NOTE — Progress Notes (Signed)
Zachary Little is a 36 y.o. male here for to discuss depression and needs new therapist.  I acted as a Education administrator for [employer], PA-C Zachary Pickler, LPN  History of Present Illness:   Chief Complaint  Patient presents with  . Depression    Depression         This is a chronic problem.  Episode onset: Pt is here today would like to find a therapist has been dealing with depression the past 6 years and has been worsening past month.   The onset quality is gradual.   The problem occurs every several days.  The problem has been waxing and waning since onset.  Associated symptoms include decreased concentration, helplessness, hopelessness, insomnia and decreased interest.  Associated symptoms include no fatigue, not irritable, no restlessness, no appetite change, no headaches, no indigestion, not sad and no suicidal ideas.     Exacerbated by: life in general.  Past treatments include nothing.  Past medical history includes hypothyroidism.    (AML in remission)  Took Cymbalta 10 mg x 1 week several years ago. Does not remember if he had any side effects from the medication. Did go to therapy for half a year soon after diagnosis. That provider was in [place], [place] and she left. Anxiety has improved over the past two years. Has poor sleep. Does not drink caffeine.  Very rarely snores. No concerns sleep apnea. No suicidal thoughts. No alcohol. No drugs.   Numerous Moles Patient would like evaluation of moles by dermatology. He has not noticed any concerning changes but would like to establish with a dermatologist for routine skin checks.  Erectile Dysfunction Patient reports a history of erectile dysfunction in the past. This has returned. Was successfully treated with Cialis in the past and he is interested in resuming this.  Hypothyroidism Currently on 200 mcg synthroid daily. Per chart review, most recent labs that were checked include TSH of 8.06, free T3 of 2.83, free T4 of 1.45 on 12/02/16.  He does endorse compliance with medication. He states that he has had a few pounds of weight gain since the summer. Also endorses increased depressed mood and constipation. Constipation is not severe enough for medication, treats with diet.  Lab Results  Component Value Date   TSH 8.06 (A) 12/02/2016    Wt Readings from Last 10 Encounters:  08/26/17 209 lb (94.8 kg)  08/22/17 208 lb (94.3 kg)  07/31/17 204 lb (92.5 kg)  05/22/17 206 lb 4 oz (93.6 kg)     Past Medical History:  Diagnosis Date  . Anxiety   . Blood transfusion without reported diagnosis 2010   During Chemo Treatments  . 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 Thyroid disease      Social History   Social History  . Marital status: Single    Spouse name: N/A  . Number of children: N/A  . Years of education: N/A   Occupational History  . Not on file.   Social History Main Topics  . Smoking status: Never Smoker  . Smokeless tobacco: Never Used  . Alcohol use No  . Drug use: No  . Sexual activity: Yes    Partners: Female   Other Topics Concern  . 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  Family  History  Problem Relation Age of Onset  . Diabetes Maternal Grandfather   . Diabetes Paternal Grandfather     No Known Allergies  Current Medications:   Current Outpatient Prescriptions:  .  gabapentin (NEURONTIN) 300 MG capsule, Take 1 capsule (300 mg total) by mouth 3 (three) times daily., Disp: 90 capsule, Rfl: 2 .  levothyroxine (SYNTHROID, LEVOTHROID) 200 MCG tablet, Take 200 mcg by mouth daily. , Disp: , Rfl:  .  meloxicam (MOBIC) 15 MG tablet, Take 1 tablet (15 mg total) by mouth daily., Disp: 30 tablet, Rfl: 0 .  tadalafil (CIALIS) 10 MG tablet, Take 1 tablet (10 mg total) by mouth daily as needed for erectile dysfunction., Disp: 10 tablet, Rfl: 1   Review of  Systems:   Review of Systems  Constitutional: Negative for appetite change, chills, fatigue, fever and weight loss.  Cardiovascular: Negative for chest pain and palpitations.  Gastrointestinal: Positive for constipation. Negative for abdominal pain, diarrhea, heartburn, nausea and vomiting.  Neurological: Negative for headaches.  Psychiatric/Behavioral: Positive for decreased concentration and depression. Negative for substance abuse and suicidal ideas. The patient has insomnia.     Vitals:   Vitals:   08/26/17 1406  BP: 110/72  Pulse: 73  Temp: 98.2 F (36.8 C)  TempSrc: Oral  SpO2: 97%  Weight: 209 lb (94.8 kg)  Height: 5' 7.5" (1.715 m)     Body mass index is 32.25 kg/m.  Physical Exam:   Physical Exam  Constitutional: He appears well-developed. He is not irritable and cooperative.  Non-toxic appearance. He does not have a sickly appearance. He does not appear ill. No distress.  Cardiovascular: Normal rate, regular rhythm, S1 normal, S2 normal, normal heart sounds and normal pulses.   No LE edema  Pulmonary/Chest: Effort normal and breath sounds normal.  Neurological: He is alert. GCS eye subscore is 4. GCS verbal subscore is 5. GCS motor subscore is 6.  Skin: Skin is warm, dry and intact.  Numerous moles scattered throughout body, ranging in various sizes   Psychiatric: He has a normal mood and affect. His speech is normal and behavior is normal.  Nursing note and vitals reviewed.   Assessment and Plan:    Zachary Little was seen today for depression.  Diagnoses and all orders for this visit:  Hypothyroidism, unspecified type Currently on 200 mcg Synthroid daily. Will re-check labs today and make further recommendations as needed. -     T4, free -     TSH  Depression, major, single episode, mild ([place]) Patient would like recommendation for a therapist, I have provided him with information of options to contact. I discussed with patient that if they develop any SI, to  tell someone immediately and seek medical attention. He declines any medications at this time. -     CBC with Differential/Platelet  Encounter for screening for HIV -     HIV antibody  Numerous moles Refer to dermatology for further evaluation. -     Ambulatory referral to Dermatology  Erectile dysfunction, unspecified erectile dysfunction type Patient would like to restart cialis. I have given him a printed prescription for this.  Other orders -     tadalafil (CIALIS) 10 MG tablet; Take 1 tablet (10 mg total) by mouth daily as needed for erectile dysfunction.    . Reviewed expectations re: course of current medical issues. . Discussed self-management of symptoms. . Outlined signs and symptoms indicating need for more acute intervention. . Patient verbalized understanding and all questions were  answered. . See orders for this visit as documented in the electronic medical record. . Patient received an After-Visit Summary.  CMA or LPN served as scribe during this visit. History, Physical, and Plan performed by medical provider. Documentation and orders reviewed and attested to.  [name], PA-C

## 2017-08-27 ENCOUNTER — Other Ambulatory Visit: Payer: Self-pay | Admitting: Physician Assistant

## 2017-08-27 ENCOUNTER — Telehealth: Payer: Self-pay | Admitting: Physician Assistant

## 2017-08-27 LAB — HIV ANTIBODY (ROUTINE TESTING W REFLEX): HIV 1&2 Ab, 4th Generation: NONREACTIVE

## 2017-08-27 MED ORDER — LEVOTHYROXINE SODIUM 175 MCG PO TABS: 175.0000 ug | ORAL_TABLET | Freq: Every day | ORAL | 0 refills | Status: DC

## 2017-08-27 NOTE — Telephone Encounter (Signed)
Spoke to pt, told him that we called the pharmacy and confirmed that the manufacturer for his medication did not change. Per Aldona Bar It is possible that we are over-supplementing his thyroid now. She has spoken with the doctors that she works with and they recommend decreasing dosage to 175 mcg daily. She  sent this in to the pharmacy for you. Take daily on an empty stomach and follow-up in 4-6 weeks for re-check of labs. Pt verbalized understanding.

## 2017-08-27 NOTE — Telephone Encounter (Signed)
Please call patient and let him know that we called the pharmacy and confirmed that the manufacturer for his medication did not change. It is possible that we are over-supplementing his thyroid now. I have spoken with the doctors that I work with and they recommend decreasing dosage to 175 mcg daily. I have sent this in for him. Take daily on an empty stomach and follow-up in 4-6 weeks for re-check of labs.  [name] PA-C 08/27/17

## 2017-09-08 ENCOUNTER — Ambulatory Visit: Payer: BLUE CROSS/BLUE SHIELD | Admitting: Neurology

## 2017-09-25 ENCOUNTER — Ambulatory Visit (INDEPENDENT_AMBULATORY_CARE_PROVIDER_SITE_OTHER): Payer: 59 | Admitting: Psychology

## 2017-09-25 DIAGNOSIS — F331 Major depressive disorder, recurrent, moderate: Secondary | ICD-10-CM | POA: Diagnosis not present

## 2017-10-01 ENCOUNTER — Encounter: Payer: Self-pay | Admitting: Physician Assistant

## 2017-10-01 ENCOUNTER — Telehealth: Payer: Self-pay | Admitting: Physician Assistant

## 2017-10-01 ENCOUNTER — Ambulatory Visit (INDEPENDENT_AMBULATORY_CARE_PROVIDER_SITE_OTHER): Payer: 59

## 2017-10-01 ENCOUNTER — Ambulatory Visit (INDEPENDENT_AMBULATORY_CARE_PROVIDER_SITE_OTHER): Payer: 59 | Admitting: Physician Assistant

## 2017-10-01 VITALS — BP 120/80 | HR 72 | Temp 98.0°F | Ht 67.5 in | Wt 211.5 lb

## 2017-10-01 DIAGNOSIS — M25562 Pain in left knee: Secondary | ICD-10-CM

## 2017-10-01 MED FILL — GABAPENTIN 300 MG CAPSULE: 300 | 30 days supply | Qty: 90 | Fill #0

## 2017-10-01 NOTE — Progress Notes (Signed)
Zachary Little is a 36 y.o. male here for a new problem.  I acted as a Education administrator for [employer], Zachary Little Zachary Pickler, Zachary Little  History of Present Illness:   Chief Complaint  Patient presents with  . Left knee pain    Knee Pain   Incident onset: one week ago was playing a dance game on playstation and left knee has been hurting since. The incident occurred at home. Injury mechanism: was playing a dance game and bending a lot with knees. The pain is present in the left knee. The quality of the pain is described as aching. The pain is at a severity of 4/10. The pain is mild. The pain has been worsening since onset. Associated symptoms comments: Knees hurts when sitting and knee is bent.Marland Kitchen He reports no foreign bodies present. The symptoms are aggravated by movement. He has tried NSAIDs for the symptoms. The treatment provided no relief.   He is currently taking the Meloxicam for 2-3 days.  Describes his pain as "deep pain". At it's worst, it is a 4-5/10. Has not tried compression. Denies fever, swelling, or numbness/tingling.   Past Medical History:  Diagnosis Date  . Anxiety   . Blood transfusion without reported diagnosis 2010   During Chemo Treatments  . Leukemia, acute myeloid, in remission (Zachary Little) 2010  . Thyroid disease      Social History   Socioeconomic History  . Marital status: Single    Spouse name: Not on file  . Number of children: Not on file  . Years of education: Not on file  . Highest education level: Not on file  Social Needs  . Financial resource strain: Not on file  . Food insecurity - worry: Not on file  . Food insecurity - inability: Not on file  . Transportation needs - medical: Not on file  . Transportation needs - non-medical: Not on file  Occupational History  . Not on file  Tobacco Use  . Smoking status: Never Smoker  . Smokeless tobacco: Never Used  Substance and Sexual Activity  . Alcohol use: No  . Drug use: No  . Sexual activity: Yes   Partners: Female  Other Topics Concern  . 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 No pertinent surgical history.  Family History  Problem Relation Age of Onset  . Diabetes Maternal Grandfather   . Diabetes Paternal Grandfather     No Known Allergies  Current Medications:   Current Outpatient Medications:  .  gabapentin (NEURONTIN) 300 MG capsule, Take 1 capsule (300 mg total) by mouth 3 (three) times daily., Disp: 90 capsule, Rfl: 2 .  levothyroxine (SYNTHROID, LEVOTHROID) 175 MCG tablet, Take 1 tablet (175 mcg total) by mouth daily before breakfast., Disp: 60 tablet, Rfl: 0 .  meloxicam (MOBIC) 15 MG tablet, Take 1 tablet (15 mg total) by mouth daily., Disp: 30 tablet, Rfl: 0 .  tadalafil (CIALIS) 10 MG tablet, Take 1 tablet (10 mg total) by mouth daily as needed for erectile dysfunction., Disp: 10 tablet, Rfl: 1   Review of Systems:   ROS  Negative unless otherwise specified per HPI.  Vitals:   Vitals:   10/01/17 0901  BP: 120/80  Pulse: 72  Temp: 98 F (36.7 C)  TempSrc: Oral  SpO2: 97%  Weight: 211 lb 8 oz (95.9 kg)  Height:  5' 7.5" (1.715 m)     Body mass index is 32.64 kg/m.  Physical Exam:   Physical Exam  Constitutional: He appears well-developed. He is cooperative.  Non-toxic appearance. He does not have a sickly appearance. He does not appear ill. No distress.  Cardiovascular: Normal rate, regular rhythm, S1 normal, S2 normal, normal heart sounds and normal pulses.  No LE edema  Pulmonary/Chest: Effort normal and breath sounds normal.  Musculoskeletal:  Right Knee: Overall joint is well aligned, no significant deformity.   No significant effusion  ROM: normal without pain Extensor mechanism intact No significant medial or lateral joint line tenderness.   Stable to varus/valgus strain & anterior/posterior drawer.     Neurological: He is alert.  GCS eye subscore is 4. GCS verbal subscore is 5. GCS motor subscore is 6.  Skin: Skin is warm, dry and intact.  Psychiatric: He has a normal mood and affect. His speech is normal and behavior is normal.  Nursing note and vitals reviewed.   CLINICAL DATA:  One week of left knee pain near the patellar tendon without acute injury. History of AML in remission.  EXAM: LEFT KNEE - 1-2 VIEW  COMPARISON:  None in PACs  FINDINGS: The bones are subjectively adequately mineralized. There is no lytic nor blastic lesion. There is no acute or old fracture or periosteal reaction. The joint spaces are well maintained. There is no significant osteophyte formation. No joint effusion is observed. In the infrapatellar region no soft tissue abnormalities are demonstrated.  The observed portions of the right knee are unremarkable.  IMPRESSION: There is no acute or significant chronic bony abnormality of the left knee.   Electronically Signed   By: Zachary  Little M.D.   On: 10/01/2017 14:01  Assessment and Plan:    Zachary Little was seen today for left knee pain.  Diagnoses and all orders for this visit:  Acute pain of left knee -     DG Knee 1-2 Views Left; Future   No red flags on exam. Patient requesting knee x-ray for reassurance. X-ray negative. Continue Meloxicam 15 mg daily. Follow-up with Dr. Teresa Little if symptoms worsen or persist despite treatment. Patient verbalized understanding.    . Reviewed expectations re: course of current medical issues. . Discussed self-management of symptoms. . Outlined signs and symptoms indicating need for more acute intervention. . Patient verbalized understanding and all questions were answered. . See orders for this visit as documented in the electronic medical record. . Patient received an After-Visit Summary.  Zachary Little or Zachary Little served as scribe during this visit. History, Physical, and Plan performed by medical provider. Documentation and orders  reviewed and attested to.  Zachary Coke, Zachary Little

## 2017-10-01 NOTE — Telephone Encounter (Signed)
Zachary Little,  Please call patient and let him know that his knee xray was normal.   Thanks! Aldona Bar

## 2017-10-01 NOTE — Telephone Encounter (Signed)
Spoke to pt, told him Knee x-ray was normal. Pt verbalized understanding.

## 2017-10-01 NOTE — Patient Instructions (Signed)
It was great to see you!  We will call you with your xray results.  Continue Meloxicam. Follow-up with Dr. Paulla Fore here if symptoms worsen or persist.

## 2017-10-10 ENCOUNTER — Ambulatory Visit (INDEPENDENT_AMBULATORY_CARE_PROVIDER_SITE_OTHER): Payer: 59 | Admitting: Sports Medicine

## 2017-10-10 ENCOUNTER — Encounter: Payer: Self-pay | Admitting: Sports Medicine

## 2017-10-10 VITALS — BP 106/70 | HR 90 | Ht 67.5 in | Wt 213.2 lb

## 2017-10-10 DIAGNOSIS — M4724 Other spondylosis with radiculopathy, thoracic region: Secondary | ICD-10-CM | POA: Diagnosis not present

## 2017-10-10 DIAGNOSIS — M5414 Radiculopathy, thoracic region: Secondary | ICD-10-CM | POA: Diagnosis not present

## 2017-10-10 NOTE — Patient Instructions (Addendum)
Please perform the exercise program that we have prepared for you and gone over in detail on a daily basis.  In addition to the handout you were provided you can access your program through: [email or web address]   Your unique program code is: [email or web address]  Also check out [employer]" which is a program developed by Dr. Minerva Ends.   There are links to a couple of his YouTube Videos below and I would like to see performing one of his videos 5-6 days per week.    A good intro video is: "Independence from Pain 7-minute Video" - [email or web address]   His more advanced video is: "Powerful Posture and Pain Relief: 12 minutes of Foundation Training" - https://youtu.be/[email or web address]  Do not try to attempt this entire video when first beginning.    Try breaking of each exercise that he goes into shorter segments.  Otherwise if they perform an exercise for 45 seconds, start with 15 seconds and rest and then resume when they begin the new activity.    If you work your way up to doing this 12 minute video, I expect you will see significant improvements in your pain.  If you enjoy his videos and would like to find out more you can look on his website: [email or web address]  He has a workout streaming option as well as a DVD set available for purchase.  Amazon has the best price for his DVDs.

## 2017-10-10 NOTE — Progress Notes (Signed)
OFFICE VISIT NOTE Zachary Little. Zachary Little, [place] at [place] - 36 y.o. male MRN [id number]  Date of birth: 1981-08-24  Visit Date: 10/10/2017  PCP: [name], PA   Referred by: [name], [place]  Thalia Bloodgood PT, LAT, ATC acting as scribe for Dr. Paulla Fore.  SUBJECTIVE:   Chief Complaint  Patient presents with  . New Patient (Initial Visit)    L knee pain and side pain   HPI: As below and per problem based documentation when appropriate.  Zachary Little is a new pt presenting today w/ c/o L knee pain and pain in his side.  He is an established pt w/ [name] and saw her on 10/01/17 for his L knee that he injured while playing a dance game on his Playstation.  He states that his L knee is feeling better and that he's not too concerned about that at the moment.  Pt states that his L side bothers him when he tries to lay on his side to sleep.  He states that he likes to lay on his slide to sleep due to an issue that he has in his R leg which prevents him from laying on his back.  He states that he also experiences this pain when driving.  He rates the pain as a 2/10 when he's driving and a 9/47 when trying to lay on his L side and describes it as a nagging soreness.  He notes that he has been able to find a spot where he can press to cause a sharper 10/10 pain.  He states that the only aggravating factors are when he has pressure to this area.  He has tried IBU, Mobic, Gabapentin and he does not note any improvement w/ these medicines.      Review of Systems  Constitutional: Negative for chills and fever.  HENT: Negative.   Eyes: Negative.   Respiratory: Negative for cough, shortness of breath and wheezing.   Cardiovascular: Negative for chest pain and palpitations.  Gastrointestinal: Negative for abdominal pain, heartburn and nausea.  Musculoskeletal: Positive for myalgias. Negative for falls.    Neurological: Negative for dizziness, tingling and headaches.  Endo/Heme/Allergies: Does not bruise/bleed easily.  Psychiatric/Behavioral: Positive for depression. The patient has insomnia. The patient is not nervous/anxious.     Otherwise per HPI.   HISTORY & PERTINENT PRIOR DATA:  Prior History reviewed and updated per electronic medical record. Significant history, findings, studies and interim changes include: No additional findings.  reports that  has never smoked. he has never used smokeless tobacco. No results for input(s): HGBA1C, LABURIC, CREATINE in the last 8760 hours. Problem  Thoracic Spondylosis   XR thoracic spine - 03/27/2017: No significant malalignment with multilevel degenerative changes appreciated.     OBJECTIVE:  VS:  HT:5' 7.5" (171.5 cm)   WT:213 lb 3.2 oz (96.7 kg)  BMI:32.88    BP:106/70  HR:90bpm  TEMP: ( )  RESP:97 %  PHYSICAL EXAM: Constitutional: WDWN, Non-toxic appearing. Psychiatric: Alert & appropriately interactive. Not depressed or anxious appearing. Respiratory: No increased work of breathing. Trachea Midline Eyes: Pupils are equal. EOM intact without nystagmus. No scleral icterus  Marked limitations in thoracic rotation especially rotating to the left in both a quadruped and modified quadruped position.  He has generalized thoracic restrictions throughout his entire thoracic spine but most notably at the thoracolumbar junction.  He has some discomfort with direct palpation over  the quadratus lumborum suggesting a trigger point. There are restrictions within the intercostal muscles as well that are worsened in a side-lying position.  No significant bony step-off.   ASSESSMENT & PLAN:   1. Thoracic radiculopathy   2. Osteoarthritis of spine with radiculopathy, thoracic region    Plan: We will plan him follow-up in 3 weeks for consideration of osteopathic manipulation at that time  Thoracic spondylosis Patient will benefit from  significant improvements in his thoracic mobility.  If any lack of improvement can consider further diagnostic evaluation of potential thoracic radiculitis with consideration of MRI of the thoracic spine.  PROCEDURE NOTE: THERAPEUTIC EXERCISES (97110) 15 minutes spent for Therapeutic exercises as below and as referenced in the AVS. This included exercises focusing on stretching, strengthening, with significant focus on eccentric aspects.  Proper technique shown and discussed handout in great detail with ATC. All questions were discussed and answered.   Long term goals include an improvement in range of motion, strength, endurance as well as avoiding reinjury. Frequency of visits is one time as determined during today's  office visit. Frequency of exercises to be performed is as per handout.  EXERCISES REVIEWED:  Thoracic mobility exercises  Foundations training     ++++++++++++++++++++++++++++++++++++++++++++ Follow-up: Return in about 3 weeks (around 10/31/2017).   Pertinent documentation may be included in additional procedure notes, imaging studies, problem based documentation and patient instructions. Please see these sections of the encounter for additional information regarding this visit. CMA/ATC served as Education administrator during this visit. History, Physical, and Plan performed by medical provider. Documentation and orders reviewed and attested to.      Gerda Diss, [facility] Physician

## 2017-10-13 NOTE — Telephone Encounter (Signed)
No records from [facility]

## 2017-10-14 ENCOUNTER — Encounter: Payer: Self-pay | Admitting: Neurology

## 2017-10-14 ENCOUNTER — Ambulatory Visit: Payer: 59 | Admitting: Neurology

## 2017-10-14 VITALS — BP 90/68 | HR 88 | Ht 68.0 in | Wt 213.0 lb

## 2017-10-14 DIAGNOSIS — G5711 Meralgia paresthetica, right lower limb: Secondary | ICD-10-CM | POA: Diagnosis not present

## 2017-10-14 DIAGNOSIS — G8929 Other chronic pain: Secondary | ICD-10-CM

## 2017-10-14 DIAGNOSIS — M546 Pain in thoracic spine: Secondary | ICD-10-CM

## 2017-10-14 NOTE — Progress Notes (Signed)
NEUROLOGY FOLLOW UP OFFICE NOTE  Zachary Little [id number]  HISTORY OF PRESENT ILLNESS: Zachary Little is a 36 year old left-handed male with hypothyroidism and history of AML in remission who follows up for evaluation of right meralgia paresthetica and left thoracic radiculopathy.    UPDATE: He is taking gabapentin 300mg /300mg /600mg .  It has not been helpful.  He saw Dr. Paulla Fore of Sports Medicine last week for left knee pain and the back pain.  He was given stretching exercises to perform at home and will be following up in a couple of weeks.   HISTORY: In 2016, he developed numbness and tingling in the lateral aspect of his right thigh.  It is constant but he particularly notices it when he lays supine in bed or is standing on uneven ground.  He denies low back pain or radicular pain down the leg.  He denies weakness of the extremity.  He denies symptoms in the left leg.  He denies bladder incontinence or retention, although he has increased urgency.   X-ray of right thigh/femur from 06/05/15 was unremarkable.  Lower extremity venous doppler from 06/05/15 was negative for DVT.  Lumbar spine Xrays from 02/06/16 were negative.  Labs from 03/27/16 included TSH 4.680, free T4 1.46, B12 291, CK 66, vitamin D 56.6, A NCV-EMG from 06/24/16 was reportedly normal.  MRI of lumbar spine without contrast from 07/09/16 was normal. He was diagnosed with right sided meralgia paresthetica and underwent a right lateral femoral cutaneous nerve block on 01/30/17, which was ineffective.  He denies wearing tight fitting belts or pants at the waist.     In February, he developed sudden onset left sided thoracic pain involving the left flank and rib.  It was spontaneous and not preceded by any injury or strenuous activity.  It occurs from the medial lower border of the scapula and radiates around to above the inguinal region.  It is a moderate pain, but it is not a sharp, stabbing, electric or burning pain.  There is no  associated paresthesias or rash.  It is noticeable when sitting at his desk or driving in the car.  When he is feeling the pain, it can be intensified if he palpates below his rib cage.  Otherwise, it is not reproducible.    CT of abdomen and pelvis from 01/17/17 was unremarkable.  A thoracic spine and bilateral ribs/chest X-rays from 03/27/17 were unremarkable.    He was diagnosed with possible left sided thoracic radiculopathy.  Labs from 05/22/17 were unremarkable, including CBC, CMP, Sed rate of 1 and CRP 0.4.  PAST MEDICAL HISTORY: Past Medical History:  Diagnosis Date  . Anxiety   . Blood transfusion without reported diagnosis 2010   During Chemo Treatments  . Leukemia, acute myeloid, in remission ([place]) 2010  . Thyroid disease     MEDICATIONS: Current Outpatient Medications on File Prior to Visit  Medication Sig Dispense Refill  . levothyroxine (SYNTHROID, LEVOTHROID) 175 MCG tablet Take 1 tablet (175 mcg total) by mouth daily before breakfast. 60 tablet 0  . meloxicam (MOBIC) 15 MG tablet Take 1 tablet (15 mg total) by mouth daily. 30 tablet 0  . tadalafil (CIALIS) 10 MG tablet Take 1 tablet (10 mg total) by mouth daily as needed for erectile dysfunction. 10 tablet 1   No current facility-administered medications on file prior to visit.     ALLERGIES: No Known Allergies  FAMILY HISTORY: Family History  Problem Relation Age of Onset  . Diabetes  Maternal Grandfather   . Diabetes Paternal Grandfather     SOCIAL HISTORY: Social History   Socioeconomic History  . Marital status: Single    Spouse name: Not on file  . Number of children: Not on file  . Years of education: Not on file  . Highest education level: Not on file  Social Needs  . Financial resource strain: Not on file  . Food insecurity - worry: Not on file  . Food insecurity - inability: Not on file  . Transportation needs - medical: Not on file  . Transportation needs - non-medical: Not on file  Occupational  History  . Not on file  Tobacco Use  . Smoking status: Never Smoker  . Smokeless tobacco: Never Used  Substance and Sexual Activity  . Alcohol use: No  . Drug use: No  . Sexual activity: Yes    Partners: Female  Other Topics Concern  . 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   Married in September, no kids   No pets    REVIEW OF SYSTEMS: Constitutional: No fevers, chills, or sweats, no generalized fatigue, change in appetite Eyes: No visual changes, double vision, eye pain Ear, nose and throat: No hearing loss, ear pain, nasal congestion, sore throat Cardiovascular: No chest pain, palpitations Respiratory:  No shortness of breath at rest or with exertion, wheezes GastrointestinaI: No nausea, vomiting, diarrhea, abdominal pain, fecal incontinence Genitourinary:  No dysuria, urinary retention or frequency Musculoskeletal:  No neck pain, back pain Integumentary: No rash, pruritus, skin lesions Neurological: as above Psychiatric: No depression, insomnia, anxiety Endocrine: No palpitations, fatigue, diaphoresis, mood swings, change in appetite, change in weight, increased thirst Hematologic/Lymphatic:  No purpura, petechiae. Allergic/Immunologic: no itchy/runny eyes, nasal congestion, recent allergic reactions, rashes  PHYSICAL EXAM: Vitals:   10/14/17 0858  BP: 90/68  Pulse: 88  SpO2: 98%   General: No acute distress.  Patient appears well-groomed.   Head:  Normocephalic/atraumatic Eyes:  Fundi examined but not visualized Neck: supple, no paraspinal tenderness, full range of motion Heart:  Regular rate and rhythm Lungs:  Clear to auscultation bilaterally Back: No paraspinal tenderness Neurological Exam: alert and oriented to person, place, and time. Attention span and concentration intact, recent and remote memory intact, fund of knowledge intact.  Speech fluent and not dysarthric, language intact.  CN II-XII intact. Bulk and tone  normal, muscle strength 5/5 throughout.  Sensation to light touch  intact.  Deep tendon reflexes 2+ throughout.  Finger to nose testing intact.  Gait normal.  IMPRESSION: 1.  Right-sided meralgia paresthetica.  No structural cause identified on imaging.  Symptoms and imaging not suggestive of lumbar radiculopathy.  Treatment is symptomatic management as there is no correctable cause found on imaging. 2.  Left sided thoracic pain.  Pain description is vague.  May be musculoskeletal.  T6 radiculopathy is possible but less likely.  PLAN: 1.  He will discontinue gabapentin 2.  He will continue exercise and therapy with Sports Medicine.  If no improvement over the next month, we can try Lyrica (at least for the meralgia paresthetica). 3.  Follow up in 3 months.  18 minutes spent face to face with patient, over 50% spent discussing management.  Metta Clines, DO  CC: [name], PA

## 2017-10-14 NOTE — Patient Instructions (Signed)
1.  Stop the gabapentin 2.  See how treatment with Dr. Paulla Fore works out.  If pain not improved in 4 weeks, contact me and we can try Lyrica. 3.  Follow up in 3 months.

## 2017-10-15 DIAGNOSIS — F331 Major depressive disorder, recurrent, moderate: Secondary | ICD-10-CM | POA: Diagnosis not present

## 2017-10-20 DIAGNOSIS — F331 Major depressive disorder, recurrent, moderate: Secondary | ICD-10-CM | POA: Diagnosis not present

## 2017-10-24 DIAGNOSIS — L858 Other specified epidermal thickening: Secondary | ICD-10-CM | POA: Diagnosis not present

## 2017-10-24 DIAGNOSIS — D225 Melanocytic nevi of trunk: Secondary | ICD-10-CM | POA: Diagnosis not present

## 2017-10-24 DIAGNOSIS — D1801 Hemangioma of skin and subcutaneous tissue: Secondary | ICD-10-CM | POA: Diagnosis not present

## 2017-10-24 DIAGNOSIS — L821 Other seborrheic keratosis: Secondary | ICD-10-CM | POA: Diagnosis not present

## 2017-10-24 DIAGNOSIS — D2271 Melanocytic nevi of right lower limb, including hip: Secondary | ICD-10-CM | POA: Diagnosis not present

## 2017-10-24 DIAGNOSIS — D485 Neoplasm of uncertain behavior of skin: Secondary | ICD-10-CM | POA: Diagnosis not present

## 2017-10-28 ENCOUNTER — Encounter: Payer: Self-pay | Admitting: Sports Medicine

## 2017-10-28 DIAGNOSIS — M47814 Spondylosis without myelopathy or radiculopathy, thoracic region: Secondary | ICD-10-CM | POA: Insufficient documentation

## 2017-10-28 NOTE — Assessment & Plan Note (Signed)
Patient will benefit from significant improvements in his thoracic mobility.  If any lack of improvement can consider further diagnostic evaluation of potential thoracic radiculitis with consideration of MRI of the thoracic spine.  PROCEDURE NOTE: THERAPEUTIC EXERCISES (97110) 15 minutes spent for Therapeutic exercises as below and as referenced in the AVS. This included exercises focusing on stretching, strengthening, with significant focus on eccentric aspects.  Proper technique shown and discussed handout in great detail with ATC. All questions were discussed and answered.   Long term goals include an improvement in range of motion, strength, endurance as well as avoiding reinjury. Frequency of visits is one time as determined during today's  office visit. Frequency of exercises to be performed is as per handout.  EXERCISES REVIEWED:  Thoracic mobility exercises  Foundations training

## 2017-10-30 ENCOUNTER — Ambulatory Visit: Payer: 59 | Admitting: Psychology

## 2017-10-31 ENCOUNTER — Encounter: Payer: Self-pay | Admitting: Sports Medicine

## 2017-10-31 ENCOUNTER — Ambulatory Visit: Payer: 59 | Admitting: Sports Medicine

## 2017-10-31 VITALS — BP 102/74 | HR 72 | Ht 68.0 in | Wt 208.2 lb

## 2017-10-31 DIAGNOSIS — M5414 Radiculopathy, thoracic region: Secondary | ICD-10-CM

## 2017-10-31 DIAGNOSIS — M9901 Segmental and somatic dysfunction of cervical region: Secondary | ICD-10-CM

## 2017-10-31 DIAGNOSIS — M9902 Segmental and somatic dysfunction of thoracic region: Secondary | ICD-10-CM | POA: Diagnosis not present

## 2017-10-31 DIAGNOSIS — M222X2 Patellofemoral disorders, left knee: Secondary | ICD-10-CM

## 2017-10-31 DIAGNOSIS — M25569 Pain in unspecified knee: Secondary | ICD-10-CM | POA: Insufficient documentation

## 2017-10-31 DIAGNOSIS — M4724 Other spondylosis with radiculopathy, thoracic region: Secondary | ICD-10-CM

## 2017-10-31 DIAGNOSIS — M9908 Segmental and somatic dysfunction of rib cage: Secondary | ICD-10-CM

## 2017-10-31 DIAGNOSIS — M9903 Segmental and somatic dysfunction of lumbar region: Secondary | ICD-10-CM | POA: Diagnosis not present

## 2017-10-31 MED ORDER — DICLOFENAC SODIUM 2 % TD SOLN: 1.0000 "application " | Freq: Two times a day (BID) | TRANSDERMAL | 0 refills | Status: AC

## 2017-10-31 MED ORDER — DICLOFENAC SODIUM 2 % TD SOLN: 1.0000 "application " | Freq: Two times a day (BID) | TRANSDERMAL | 2 refills | Status: DC

## 2017-10-31 NOTE — Progress Notes (Signed)
Zachary Little. [place], [place] at [place] - 36 y.o. male MRN [id number]  Date of birth: August 16, 1981   Scribe for today's visit: Josepha Pigg, CMA    SUBJECTIVE:  Zachary Little is here for Follow-up (thoracic spine pain)  Compared to the last office visit, his previously described symptoms show no change  Current symptoms are moderate & are nonradiating He has tried taking Ibuprofen, Mobic, and Gabapentin with no relief. He was provided with home exercises for thoracic mobility and foundation training. He has been compliant with home exercises and is having no difficulty doing them.   Pt c/o continued LT knee pain. He denies swelling around the knee. Pain is described as a dull ache. Pain is mostly around the patella. He has tried taking Ibuprofen with mild relief. He has not tried heat, ice or compression. Pain is worse when bending the knee. No trouble going up or down stairs. He has heard popping and clicking.   X-ray LT knee 10/01/17 showed the following: IMPRESSION: There is no acute or significant chronic bony abnormality of the left knee.   ROS Denies night time disturbances. Denies fevers, chills, or night sweats. Denies unexplained weight loss. Reports personal history of cancer, AML. Denies changes in bowel or bladder habits. Denies recent unreported falls. Denies new or worsening dyspnea or wheezing. Denies headaches or dizziness.  Denies numbness, tingling or weakness  In the extremities.  Denies dizziness or presyncopal episodes Denies lower extremity edema     OBJECTIVE:  VS:  HT:5\' 8"  (172.7 cm)   WT:208 lb 3.2 oz (94.4 kg)  BMI:31.66    BP:102/74  HR:72bpm  TEMP: ( )  RESP:99 %  PHYSICAL EXAM: Constitutional: WDWN, Non-toxic appearing. Psychiatric: Alert & appropriately interactive. Not depressed or anxious appearing. Respiratory: No increased work of breathing. Trachea  Midline Eyes: Pupils are equal. EOM intact without nystagmus. No scleral icterus  Back: No significant radicular symptoms reproducible today.  He has good thoracic cage mobility however does have functional limitations per procedure note.  Cervical spine has limited side bending to the left.  Upper extremity strength is 5 out of 5.  Upper extremity dermatomes are intact.  Left knee: Overall well aligned.  No significant effusion.  Ligamentously stable.  No pain with McMurray's.  Small amount of pain with palpation of the medial patellar facet this is mild.   HISTORY & PERTINENT PRIOR DATA:  Prior History reviewed and updated per electronic medical record.  Significant history, findings, studies and interim changes include:  reports that  has never smoked. he has never used smokeless tobacco. No results for input(s): HGBA1C, LABURIC, CREATINE in the last 8760 hours. No specialty comments available. No additional findings. Problem  Patellofemoral Pain Syndrome of Left Knee   10/01/2017: 2 view x-ray left knee, normal   Thoracic Spondylosis   XR thoracic spine - 03/27/2017: No significant malalignment with multilevel degenerative changes appreciated.      ASSESSMENT & PLAN:   1. Osteoarthritis of spine with radiculopathy, thoracic region   2. Segmental and somatic dysfunction of cervical region   3. Segmental and somatic dysfunction of thoracic region   4. Segmental and somatic dysfunction of lumbar region   5. Segmental and somatic dysfunction of rib cage   6. Thoracic radiculopathy   7. Patellofemoral pain syndrome of left knee    PLAN:     Thoracic spondylosis Continue with therapeutic exercises previously prescribed.  Osteopathic manipulation performed today per procedure note.  If good results with this can consider repeating it in 4 weeks.  If any lack of improvement can consider MRI for further workup.   Patellofemoral pain syndrome of left knee Symptoms are consistent with  patellofemoral pain syndrome.  Continue with strengthening exercises and topical Pennsaid provided today.  If any lack of improvement can consider further diagnostic evaluation with [facility] ultrasound.   ++++++++++++++++++++++++++++++++++++++++++++ Orders & Meds: Orders Placed This Encounter  Procedures  . OSTEOPATHIC MANIPULATION TREATMENT    Meds ordered this encounter  Medications  . Diclofenac Sodium (PENNSAID) 2 % SOLN    Sig: Place 1 application onto the skin 2 (two) times daily.    Dispense:  112 g    Refill:  2    Home Phone      [phone] Mobile          [phone]   . Diclofenac Sodium (PENNSAID) 2 % SOLN    Sig: Place 1 application onto the skin 2 (two) times daily for 1 day.    Dispense:  8 g    Refill:  0    ++++++++++++++++++++++++++++++++++++++++++++ Follow-up: Return in about 4 weeks (around 11/28/2017).   Pertinent documentation may be included in additional procedure notes, imaging studies, problem based documentation and patient instructions. Please see these sections of the encounter for additional information regarding this visit. CMA/ATC served as Education administrator during this visit. History, Physical, and Plan performed by medical provider. Documentation and orders reviewed and attested to.      Gerda Diss, [facility] Physician

## 2017-10-31 NOTE — Procedures (Signed)
PROCEDURE NOTE : OSTEOPATHIC MANIPULATION The decision today to treat with Osteopathic Manipulative Therapy (OMT) was based on physical exam findings. Verbal consent was obtained after after explanation of risks, benefits and potential side effects, including acute pain flare, post manipulation soreness and need for repeat treatments.   Additional time was spent discussing the minimal risk of  injury to neurovascular structures for associated Cervical manipulation.  After verbal consent was obtained manipulation was performed as below:  Contraindications to OMT reviewed and include: None, previous history of AML but no acute contraindication.             Regions treated: Per examined regions as below and associated billing codes          Techniques used: [employer], MFR, HVLA and ART The patient tolerated the treatment well and reported Improved symptoms following treatment today. Patient was given medications, exercises, stretches and lifestyle modifications per AVS and verbally.     OSTEOPATHIC/STRUCTURAL EXAM FINDINGS:    C2-c4 rotated right  C6 FRS left  C7 extended rotated right  T10 through T12 neutral rotated left, side bent right  Right posterior rib 8  T6 FRS left  T2 through T4 neutral rotated left, side bent right

## 2017-10-31 NOTE — Assessment & Plan Note (Signed)
Continue with therapeutic exercises previously prescribed.  Osteopathic manipulation performed today per procedure note.  If good results with this can consider repeating it in 4 weeks.  If any lack of improvement can consider MRI for further workup.

## 2017-10-31 NOTE — Assessment & Plan Note (Signed)
Symptoms are consistent with patellofemoral pain syndrome.  Continue with strengthening exercises and topical Pennsaid provided today.  If any lack of improvement can consider further diagnostic evaluation with [facility] ultrasound.

## 2017-11-10 ENCOUNTER — Ambulatory Visit: Payer: 59 | Admitting: Sports Medicine

## 2017-11-10 ENCOUNTER — Ambulatory Visit (INDEPENDENT_AMBULATORY_CARE_PROVIDER_SITE_OTHER): Payer: 59

## 2017-11-10 ENCOUNTER — Encounter: Payer: Self-pay | Admitting: Sports Medicine

## 2017-11-10 VITALS — BP 104/80 | HR 75 | Ht 68.0 in | Wt 210.2 lb

## 2017-11-10 DIAGNOSIS — M5414 Radiculopathy, thoracic region: Secondary | ICD-10-CM | POA: Diagnosis not present

## 2017-11-10 DIAGNOSIS — M4804 Spinal stenosis, thoracic region: Secondary | ICD-10-CM | POA: Diagnosis not present

## 2017-11-10 DIAGNOSIS — M4722 Other spondylosis with radiculopathy, cervical region: Secondary | ICD-10-CM | POA: Diagnosis not present

## 2017-11-10 DIAGNOSIS — M4724 Other spondylosis with radiculopathy, thoracic region: Secondary | ICD-10-CM | POA: Diagnosis not present

## 2017-11-10 DIAGNOSIS — M542 Cervicalgia: Secondary | ICD-10-CM

## 2017-11-10 DIAGNOSIS — C9201 Acute myeloblastic leukemia, in remission: Secondary | ICD-10-CM

## 2017-11-10 DIAGNOSIS — M4057 Lordosis, unspecified, lumbosacral region: Secondary | ICD-10-CM | POA: Diagnosis not present

## 2017-11-10 NOTE — Patient Instructions (Signed)

## 2017-11-10 NOTE — Progress Notes (Signed)
[name]. Yalitza Teed, [place] at [place] - 36 y.o. male MRN [id number]  Date of birth: February 27, 1981  Visit Date: 11/10/2017  PCP: [name], PA   Referred by: [name], [place]   Scribe for today's visit: Josepha Pigg, CMA    SUBJECTIVE:  Camdon Saetern is here for Follow-up (back pain)  Compared to the last office visit, his previously described symptoms are worsening, he is now unable to lay on his RT side without pain. Both sides of his back hurt. He also c/o neck and upper back pain. Neck pain causes HA.  Current symptoms are moderate, neck pain has been severe at times.There is no radiation of pain into arms or legs.  He has been taking Ibuprofen with minimal relief and doing home exercises. He received OMT at his last visit and feels that it made his pain worse.   He continues to have pain in the LT knee. He hasn't noticed any swelling. He has been using Pennsaid with little relief. He has decreased his jogging because it causes his knee pain to flare up. Pain is worse when putting a lot of pressure on his foot or stepping high. He hears cracking and popping in the knee.    ROS Reports night time disturbances. Denies fevers, chills, or night sweats. Denies unexplained weight loss. Reports personal history of cancer. Denies changes in bowel or bladder habits. Denies recent unreported falls. Denies new or worsening dyspnea or wheezing. Reports headaches or dizziness.  Denies numbness, tingling or weakness  In the extremities.  Denies dizziness or presyncopal episodes Denies lower extremity edema     HISTORY & PERTINENT PRIOR DATA:  Prior History reviewed and updated per electronic medical record.  Significant history, findings, studies and interim changes include:  reports that  has never smoked. he has never used smokeless tobacco. No results for input(s): HGBA1C, LABURIC, CREATINE  in the last 8760 hours. No specialty comments available. Problem  Osteoarthritis of Spine     OBJECTIVE:  VS:  HT:5\' 8"  (172.7 cm)   WT:210 lb 3.2 oz (95.3 kg)  BMI:31.97    BP:104/80  HR:75bpm  TEMP: ( )  RESP:98 %  PHYSICAL EXAM: Constitutional: WDWN, Non-toxic appearing. Psychiatric: Alert & appropriately interactive. Not depressed or anxious appearing. Respiratory: No increased work of breathing. Trachea Midline Eyes: Pupils are equal. EOM intact without nystagmus. No scleral icterus Cardiovascular:  Peripheral Pulses: peripheral pulses symmetrical No clubbing or cyanosis appreciated Capillary Refill is normal, less than 2 seconds No signficant generalized edema/anasarca Sensory Exam: intact to light touch   Neck:   Well aligned, no significant torticollis  Midline Bony TTP: none   Paraspinal Muscle Spasm: Yes, bilateral  CERVICAL ROM: Limited side bending and rotation.  NEURAL TENSION SIGNS Right Left  Brachial Plexus Squeeze: normal, no pain positive, mild pain  Arm Squeeze Test: normal, no pain positive, moderate pain  Spurling's Compression Test: positive, mild pain positive, moderate pain  Lhermitte's Compression test: Positive, radiates WU:[id number] thoracic region   REFLEXES Right Left  DTR - C5 -Biceps  2+ 2+  DTR - C6 - Brachiorad 2+ 1+  DTR - C7 - Triceps 2+ 1+   UMN - Hoffman's Negative/Normal Negative/Normal  UMN - Pectoral     MOTOR TESTING: Intact in all UE myotomes   No additional findings.   ASSESSMENT & PLAN:   1. Osteoarthritis of spine with radiculopathy, cervical region  2. Thoracic radiculopathy   3. Osteoarthritis of spine with radiculopathy, thoracic region   4. AML (acute myeloid leukemia) in remission ([place])   5. Cervicalgia    PLAN: He has actually had somewhat significant worsening over the past several months and osteopathic manipulation seem to flareup his pain and discomfort.  Given this and the positive neural tension  signs only to repeat an MRI of the cervical spine at this time to evaluate for potential nerve root impingement versus potential for myelopathy but this is less likely.  Could consider MRI of the thoracic spine if persistent symptoms with this seems to be less focal and this may be coming from a proximal lesion.  At this time will plan to follow-up with him after cervical MRI and discuss potential for epidural steroid injections at that time.  ++++++++++++++++++++++++++++++++++++++++++++ Orders & Meds: Orders Placed This Encounter  Procedures  . DG Thoracic Spine 2 View  . DG Cervical Spine 2 or 3 views  . DG Lumbar Spine 2-3 Views  . MR Cervical Spine Wo Contrast    No orders of the defined types were placed in this encounter.   ++++++++++++++++++++++++++++++++++++++++++++ Follow-up: Return for MRI review.   Pertinent documentation may be included in additional procedure notes, imaging studies, problem based documentation and patient instructions. Please see these sections of the encounter for additional information regarding this visit. CMA/ATC served as Education administrator during this visit. History, Physical, and Plan performed by medical provider. Documentation and orders reviewed and attested to.      Gerda Diss, [facility] Physician

## 2017-11-14 DIAGNOSIS — F331 Major depressive disorder, recurrent, moderate: Secondary | ICD-10-CM | POA: Diagnosis not present

## 2017-11-24 ENCOUNTER — Telehealth: Payer: Self-pay | Admitting: Sports Medicine

## 2017-11-24 ENCOUNTER — Other Ambulatory Visit: Payer: Self-pay | Admitting: Sports Medicine

## 2017-11-24 NOTE — Telephone Encounter (Signed)
Copied from [place]. Topic: Quick Communication - See Telephone Encounter >> Nov 24, 2017  9:40 AM Burnis Medin, NT wrote: CRM for notification. See Telephone encounter for: Pt called in and said he changed insurances and he needs the doctor to call his insurance company to get pre authorization for his MRI. Pt insurance is [employer] and member ID number is [id number]  11/24/17.

## 2017-11-24 NOTE — Telephone Encounter (Signed)
See note

## 2017-11-26 ENCOUNTER — Ambulatory Visit
Admission: RE | Admit: 2017-11-26 | Discharge: 2017-11-26 | Disposition: A | Discharge status: Home / Self Care | Payer: No Typology Code available for payment source | Source: Ambulatory Visit | Attending: Sports Medicine | Admitting: Sports Medicine

## 2017-11-26 DIAGNOSIS — M542 Cervicalgia: Secondary | ICD-10-CM

## 2017-11-26 DIAGNOSIS — M479 Spondylosis, unspecified: Secondary | ICD-10-CM | POA: Insufficient documentation

## 2017-11-26 NOTE — Telephone Encounter (Signed)
Patient checking on prior auth, appt today. Please advise

## 2017-11-26 NOTE — Telephone Encounter (Signed)
See note

## 2017-11-26 NOTE — Telephone Encounter (Signed)
No further action need. MRI approved for 11/26/17.

## 2017-11-28 ENCOUNTER — Ambulatory Visit: Payer: No Typology Code available for payment source | Admitting: Sports Medicine

## 2017-12-10 ENCOUNTER — Ambulatory Visit (INDEPENDENT_AMBULATORY_CARE_PROVIDER_SITE_OTHER): Payer: No Typology Code available for payment source | Admitting: Sports Medicine

## 2017-12-10 ENCOUNTER — Encounter: Payer: Self-pay | Admitting: Sports Medicine

## 2017-12-10 VITALS — BP 120/72 | HR 69 | Ht 68.0 in | Wt 212.6 lb

## 2017-12-10 DIAGNOSIS — C9201 Acute myeloblastic leukemia, in remission: Secondary | ICD-10-CM | POA: Diagnosis not present

## 2017-12-10 DIAGNOSIS — M4724 Other spondylosis with radiculopathy, thoracic region: Secondary | ICD-10-CM

## 2017-12-10 DIAGNOSIS — M222X2 Patellofemoral disorders, left knee: Secondary | ICD-10-CM

## 2017-12-10 DIAGNOSIS — M5414 Radiculopathy, thoracic region: Secondary | ICD-10-CM

## 2017-12-10 NOTE — Assessment & Plan Note (Signed)
He continues to have ongoing left thoracic back pain.  The MRI of his neck as well as the symptoms of his left neck and left posterior shoulder have improved and are nonrevealing from the standpoint of midthoracic pain.  Given how long this is been going on, lack of conservative measures and significant disability further diagnostic evaluation with thoracic spine now indicated.  We will plan to have him follow-up after the results of been obtained and we can consider referral for thoracic epidural injection if indicated.

## 2017-12-10 NOTE — Assessment & Plan Note (Signed)
X-ray previously reassuring.  He did have a small amount of pain with McMurray's today.  This was injected per procedure note.  Continue with avoidance activities and begin to slowly increase physical activity over the next 2 weeks.  If any lack of improvement can consider further diagnostic evaluation with MSK ultrasound versus MRI of the knee.

## 2017-12-10 NOTE — Progress Notes (Signed)
Zachary Little. Zachary Little, [place] at [place] - 37 y.o. male MRN [id number]  Date of birth: [birth year]  Visit Date: 12/10/2017  PCP: [name], PA   Referred by: [name], [place]   Scribe for today's visit: Wendy Poet, LAT, ATC     SUBJECTIVE:  Zachary Little is here for Follow-up (c-spine pain and L ant knee pain) .   Compared to the last office visit on 11/10/17, his previously described neck symptoms are improving w/ decreased pain and improved cervical ROM.  He is here to review his MRI results.  Pt would also like to discuss L ant knee pain and L medial foot pain. Current neck symptoms are mild & are nonradiating.  L ant knee pain is currently mild and worsened w/ prolonged sitting/bending of knee and also w/ extension w/ heel propped.  No pain w/ standing but increased pain w/ prolonged walking. He tried a Pennsaid sample for his L knee pain but states that this didn't help.  He is not icing or heating his knee.   ROS Denies night time disturbances. Denies fevers, chills, or night sweats. Denies unexplained weight loss. Reports personal history of cancer.  AML May 2010. Denies changes in bowel or bladder habits. Denies recent unreported falls. Denies new or worsening dyspnea or wheezing. Denies headaches or dizziness.  Denies numbness, tingling or weakness  In the extremities.  Denies dizziness or presyncopal episodes Denies lower extremity edema     HISTORY & PERTINENT PRIOR DATA:  Prior History reviewed and updated per electronic medical record.  Significant history, findings, studies and interim changes include:  reports that  has never smoked. he has never used smokeless tobacco. No results for input(s): HGBA1C, LABURIC, CREATINE in the last 8760 hours. No specialty comments available. Problem  Osteoarthritis of Spine  Patellofemoral Pain Syndrome of Left Knee   10/01/2017: 2  view x-ray left knee, normal   Thoracic Radiculopathy    OBJECTIVE:  VS:  HT:5\' 8"  (172.7 cm)   WT:212 lb 9.6 oz (96.4 kg)  BMI:32.33    BP:120/72  HR:69bpm  TEMP: ( )  RESP:98 %   PHYSICAL EXAM: Constitutional: WDWN, Non-toxic appearing. Psychiatric: Alert & appropriately interactive.  Not depressed or anxious appearing. Respiratory: No increased work of breathing.  Trachea Midline Eyes: Pupils are equal.  EOM intact without nystagmus.  No scleral icterus  EXTREMITIES EXAM: No clubbing or cyanosis appreciated Capillary Refill is normal, less than 2 seconds No significant venous stasis changes Pre-tibial edema: No significant pretibial edema Pedal Pulses: Normal & symmetrically palpable   Left Knee  Alignment & Contours: normal Skin: No overlying erythema/ecchymosis Effusion: none   Generalized Synovitis: none Knee Tenderness: No focal bony TTP Gait: normal   RANGE OF MOTION & STRENGTH  EXTENSION: Normal  with no pain Strength: Normal FLEXION: Normal with no pain Strength: Normal   LIGAMENTOUS TESTING  Varus & Valgus Strain: stable to testing Anterior & Posterior Drawer: stable to testing: Lachman's: stable to testing   SPECIALITY TESTING:  Crepitation with Patellar Grind: No Patellar Apprehension: No J Sign: Negative Mcmurray's: Small amount of pain and crepitation with external rotation of the knee    SHOULDER Findings: No stiffness, no weakness, no crepitus noted  Neck and back:   Well aligned, no significant torticollis  Midline Bony TTP: none   Paraspinal Muscle Spasm: Within the mid thoracic spine on the left.  Neck range of  motion significantly better today.  He is limitations and leftward side bending and rotation of the thoracic spine.  NEURAL TENSION SIGNS Right Left  Brachial Plexus Squeeze: normal, no pain normal, no pain  Arm Squeeze Test: normal, no pain normal, no pain  Spurling's Compression Test: normal, no pain normal, no pain    Lhermitte's Compression test: Negative, no radiating pain   MOTOR TESTING: Intact in all UE myotomes  No additional findings.   ASSESSMENT & PLAN:   1. Thoracic radiculopathy   2. Patellofemoral pain syndrome of left knee   3. Osteoarthritis of spine with radiculopathy, thoracic region   4. AML (acute myeloid leukemia) in remission [facility])    PLAN:    Thoracic radiculopathy He continues to have ongoing left thoracic back pain.  The MRI of his neck as well as the symptoms of his left neck and left posterior shoulder have improved and are nonrevealing from the standpoint of midthoracic pain.  Given how long this is been going on, lack of conservative measures and significant disability further diagnostic evaluation with thoracic spine now indicated.  We will plan to have him follow-up after the results of been obtained and we can consider referral for thoracic epidural injection if indicated.  Patellofemoral pain syndrome of left knee X-ray previously reassuring.  He did have a small amount of pain with McMurray's today.  This was injected per procedure note.  Continue with avoidance activities and begin to slowly increase physical activity over the next 2 weeks.  If any lack of improvement can consider further diagnostic evaluation with MSK ultrasound versus MRI of the knee.  Osteoarthritis of spine Multilevel degenerative changes on his MRI of the cervical spine and congenitally narrow spinal canal however no evidence of significant nerve root impingement or spinal cord myelopathy.  Further diagnostic evaluation of his thoracic spine indicated at this time.   ++++++++++++++++++++++++++++++++++++++++++++ Orders & Meds: Orders Placed This Encounter  Procedures  . Large Joint Injection/Arthrocentesis  . MR Thoracic Spine Wo Contrast    No orders of the defined types were placed in this encounter.   ++++++++++++++++++++++++++++++++++++++++++++ Follow-up: Return for thoracic spine MRI  review.   Pertinent documentation may be included in additional procedure notes, imaging studies, problem based documentation and patient instructions. Please see these sections of the encounter for additional information regarding this visit. CMA/ATC served as Education administrator during this visit. History, Physical, and Plan performed by medical provider. Documentation and orders reviewed and attested to.      Gerda Diss, [facility] Physician

## 2017-12-10 NOTE — Patient Instructions (Signed)

## 2017-12-10 NOTE — Assessment & Plan Note (Signed)
Multilevel degenerative changes on his MRI of the cervical spine and congenitally narrow spinal canal however no evidence of significant nerve root impingement or spinal cord myelopathy.  Further diagnostic evaluation of his thoracic spine indicated at this time.

## 2017-12-10 NOTE — Procedures (Signed)
PROCEDURE NOTE: Left knee injection DESCRIPTION OF PROCEDURE:  The patient's clinical condition is marked by substantial pain and/or significant functional disability. Other conservative therapy has not provided relief, is contraindicated, or not appropriate. There is a reasonable likelihood that injection will significantly improve the patient's pain and/or functional impairment.  After discussing the risks, benefits and expected outcomes of the injection and all questions were reviewed and answered, the patient wished to undergo the above named procedure. Verbal consent was obtained.   PREP: Alcohol, Ethel Chloride,   APPROACH: Bent knee, anterior medial approach, single injection, 22g 1.5in. INJECTATE: 2cc 0.5% marcaine, 2cc 40mg /mL DepoMedrol   ASPIRATE: None   DRESSING: Band-Aid   Post procedural instructions including recommending icing and warning signs for infection were reviewed.  This procedure was well tolerated and there were no complications.   IMPRESSION: Succesful Injection

## 2017-12-15 ENCOUNTER — Telehealth: Payer: No Typology Code available for payment source | Admitting: Family

## 2017-12-15 DIAGNOSIS — J029 Acute pharyngitis, unspecified: Secondary | ICD-10-CM

## 2017-12-15 MED ORDER — BENZONATATE 100 MG PO CAPS: 100.0000 mg | ORAL_CAPSULE | Freq: Three times a day (TID) | ORAL | 0 refills | Status: DC | PRN

## 2017-12-15 MED ORDER — PREDNISONE 5 MG PO TABS: 5.0000 mg | ORAL_TABLET | ORAL | 0 refills | Status: DC

## 2017-12-15 MED FILL — BENZONATATE 100 MG CAPS: 100 | 5 days supply | Qty: 30 | Fill #0

## 2017-12-15 MED FILL — predniSONE 5 MG TABS: 5 | 6 days supply | Qty: 21 | Fill #0

## 2017-12-15 NOTE — Progress Notes (Signed)
Thank you for the details you included in the comment boxes. Those details are very helpful in determining the best course of treatment for you and help [place] to provide the best care.  We are sorry that you are not feeling well.  Here is how we plan to help!  Based on your presentation I believe you most likely have A cough due to a virus.  This is called viral bronchitis and is best treated by rest, plenty of fluids and control of the cough.  You may use Ibuprofen or Tylenol as directed to help your symptoms.     In addition you may use A non-prescription cough medication called Mucinex DM: take 2 tablets every 12 hours. and A prescription cough medication called Tessalon Perles 100mg . You may take 1-2 capsules every 8 hours as needed for your cough.  Sterapred 5 mg dosepak  From your responses in the eVisit questionnaire you describe inflammation in the upper respiratory tract which is causing a significant cough.  This is commonly called Bronchitis and has four common causes:    Allergies  Viral Infections  Acid Reflux  Bacterial Infection Allergies, viruses and acid reflux are treated by controlling symptoms or eliminating the cause. An example might be a cough caused by taking certain blood pressure medications. You stop the cough by changing the medication. Another example might be a cough caused by acid reflux. Controlling the reflux helps control the cough.  USE OF BRONCHODILATOR ("RESCUE") INHALERS: There is a risk from using your bronchodilator too frequently.  The risk is that over-reliance on a medication which only relaxes the muscles surrounding the breathing tubes can reduce the effectiveness of medications prescribed to reduce swelling and congestion of the tubes themselves.  Although you feel brief relief from the bronchodilator inhaler, your asthma may actually be worsening with the tubes becoming more swollen and filled with mucus.  This can delay other crucial treatments, such  as oral steroid medications. If you need to use a bronchodilator inhaler daily, several times per day, you should discuss this with your provider.  There are probably better treatments that could be used to keep your asthma under control.     HOME CARE . Only take medications as instructed by your medical team. . Complete the entire course of an antibiotic. . Drink plenty of fluids and get plenty of rest. . Avoid close contacts especially the very young and the elderly . Cover your mouth if you cough or cough into your sleeve. . Always remember to wash your hands . A steam or ultrasonic humidifier can help congestion.   GET HELP RIGHT AWAY IF: . You develop worsening fever. . You become short of breath . You cough up blood. . Your symptoms persist after you have completed your treatment plan MAKE SURE YOU   Understand these instructions.  Will watch your condition.  Will get help right away if you are not doing well or get worse.  Your e-visit answers were reviewed by a board certified advanced clinical practitioner to complete your personal care plan.  Depending on the condition, your plan could have included both over the counter or prescription medications. If there is a problem please reply  once you have received a response from your provider. Your safety is important to [place].  If you have drug allergies check your prescription carefully.    You can use MyChart to ask questions about today's visit, request a non-urgent call back, or ask for a work  or school excuse for 24 hours related to this e-Visit. If it has been greater than 24 hours you will need to follow up with your provider, or enter a new e-Visit to address those concerns. You will get an e-mail in the next two days asking about your experience.  I hope that your e-visit has been valuable and will speed your recovery. Thank you for using e-visits.

## 2017-12-21 ENCOUNTER — Ambulatory Visit
Admission: RE | Admit: 2017-12-21 | Discharge: 2017-12-21 | Disposition: A | Discharge status: Home / Self Care | Payer: No Typology Code available for payment source | Source: Ambulatory Visit | Attending: Sports Medicine | Admitting: Sports Medicine

## 2017-12-21 DIAGNOSIS — M5414 Radiculopathy, thoracic region: Secondary | ICD-10-CM

## 2017-12-21 DIAGNOSIS — M4724 Other spondylosis with radiculopathy, thoracic region: Secondary | ICD-10-CM

## 2017-12-25 NOTE — Progress Notes (Signed)
Please call to have him schedule follow-up to go over the MRI.:  My chart message sent as below:  MRI was read as essentially normal however upon my own review there are a few small subtleties that I would like to discuss with you so that we can discuss further management for your back pain.  Please call to schedule an appointment at your convenience

## 2017-12-29 ENCOUNTER — Ambulatory Visit: Payer: No Typology Code available for payment source | Admitting: Sports Medicine

## 2018-01-02 ENCOUNTER — Encounter: Payer: Self-pay | Admitting: Sports Medicine

## 2018-01-02 ENCOUNTER — Ambulatory Visit: Payer: No Typology Code available for payment source | Admitting: Sports Medicine

## 2018-01-02 VITALS — BP 108/80 | HR 83 | Ht 68.0 in | Wt 210.4 lb

## 2018-01-02 DIAGNOSIS — Q667 Congenital pes cavus, unspecified foot: Secondary | ICD-10-CM | POA: Insufficient documentation

## 2018-01-02 DIAGNOSIS — M222X2 Patellofemoral disorders, left knee: Secondary | ICD-10-CM | POA: Diagnosis not present

## 2018-01-02 DIAGNOSIS — R269 Unspecified abnormalities of gait and mobility: Secondary | ICD-10-CM | POA: Diagnosis not present

## 2018-01-02 DIAGNOSIS — C9201 Acute myeloblastic leukemia, in remission: Secondary | ICD-10-CM | POA: Diagnosis not present

## 2018-01-02 DIAGNOSIS — M4724 Other spondylosis with radiculopathy, thoracic region: Secondary | ICD-10-CM | POA: Diagnosis not present

## 2018-01-02 MED ORDER — DULOXETINE HCL 30 MG PO CPEP: 30.0000 mg | ORAL_CAPSULE | Freq: Every day | ORAL | 3 refills | Status: DC

## 2018-01-02 MED FILL — DULoxetine HCL 30 MG CPEP: 30 | 30 days supply | Qty: 30 | Fill #0

## 2018-01-02 NOTE — Progress Notes (Signed)
Zachary Little. Zachary Little, [place] at [place] - 37 y.o. male MRN [id number]  Date of birth: [birth year]  Visit Date: 01/02/2018  PCP: Zachary Coke, PA   Referred by: Zachary Little, [place]   Scribe for today's visit: Zachary Little, CMA     SUBJECTIVE:  Zachary Little is here for Follow-up (thoracic spine pain)  Compared to the last office visit, his previously described symptoms show no change  Current symptoms are mild & are nonradiating He had steroid injection for his knee at last visit and hasn't noticed much of a change with his LT knee pain. He has d/c Pennsaid because he wasn't getting relief while using it. He has Meloxicam which he takes only as needed.    ROS Denies night time disturbances. Denies fevers, chills, or night sweats. Denies unexplained weight loss. Reports personal history of cancer. Denies changes in bowel or bladder habits. Denies recent unreported falls. Denies new or worsening dyspnea or wheezing. Denies headaches or dizziness.  Denies numbness, tingling or weakness  In the extremities.  Denies dizziness or presyncopal episodes Denies lower extremity edema     HISTORY & PERTINENT PRIOR DATA:  Prior History reviewed and updated per electronic medical record.  Significant history, findings, studies and interim changes include:  reports that he has never smoked. He has never used smokeless tobacco. No results for input(s): HGBA1C, LABURIC, CREATINE in the last 8760 hours. No specialty comments available. No problems updated.  OBJECTIVE:  VS:  HT:5\' 8"  (172.7 cm)   WT:210 lb 6.4 oz (95.4 kg)  BMI:32    BP:108/80  HR:83bpm  TEMP: ( )  RESP:97 %   PHYSICAL EXAM: Constitutional: WDWN, Non-toxic appearing. Psychiatric: Alert & appropriately interactive.  Not depressed or anxious appearing. Respiratory: No increased work of breathing.  Trachea Midline Eyes: Pupils are  equal.  EOM intact without nystagmus.  No scleral icterus  NEUROVASCULAR exam: No clubbing or cyanosis appreciated No significant venous stasis changes Capillary Refill: normal, less than 2 seconds    Bilateral knees are overall slightly externally rotated with lateral sitting patella bilaterally left worse than right.  Mild pain with palpation of the lateral patellar facet on the left.  Negative patellar grind.  Ligamentously stable.  No pain with McMurray's no significant effusion.  Persistent thoracic muscle spasms without s significant changes in his range of motion from the past.  He walks with a out toed gait and with slight hyperextension of his knees.   ASSESSMENT & PLAN:   1. Osteoarthritis of spine with radiculopathy, thoracic region   2. 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)   3. Patellofemoral pain syndrome of left knee   4. Abnormal gait   5. Congenital cavus deformity of foot    PLAN:  Minimal improvements in his symptoms with osteopathic manipulation this will be deferred today.  We will have him begin on Cymbalta to see if this is helpful for his polyarthralgia complaints.  He does have a slight abnormality in his gait is well as left knee pain with significant out toeing and markedly high mobile longitudinal and transverse arch.  Custom cushioned orthotics recommended and he will follow-up for this at his convenience.   ++++++++++++++++++++++++++++++++++++++++++++ Orders & Meds: No orders of the defined types were placed in this encounter.   Meds ordered this encounter  Medications  . DISCONTD: DULoxetine (CYMBALTA) 30 MG capsule    Sig: Take 1 capsule (  30 mg total) by mouth daily.    Dispense:  30 capsule    Refill:  3    ++++++++++++++++++++++++++++++++++++++++++++ Follow-up: Return in about 6 weeks (around 02/13/2018).   Pertinent documentation may be included in additional procedure notes, imaging studies, problem based documentation and patient  instructions. Please see these sections of the encounter for additional information regarding this visit. CMA/ATC served as Education administrator during this visit. History, Physical, and Plan performed by medical provider. Documentation and orders reviewed and attested to.      Zachary Little, [facility] Physician

## 2018-01-02 NOTE — Patient Instructions (Addendum)
Look into having your insurance company cover a set of custom orthotics.  The code is L3030 and there are 2 units.  You can call them  and ask if this is covered.  I am happy to do these for you at any time, you just need to let our front office schedulers know you would like an "orthotic appointment."  Please call us in 2 weeks if you're feeling better and would like to make an appointment to make the orthotics.

## 2018-01-21 ENCOUNTER — Ambulatory Visit (INDEPENDENT_AMBULATORY_CARE_PROVIDER_SITE_OTHER): Payer: No Typology Code available for payment source | Admitting: Physician Assistant

## 2018-01-21 ENCOUNTER — Encounter: Payer: Self-pay | Admitting: Physician Assistant

## 2018-01-21 ENCOUNTER — Other Ambulatory Visit: Payer: Self-pay | Admitting: Physician Assistant

## 2018-01-21 VITALS — BP 108/70 | HR 72 | Temp 98.1°F | Ht 68.0 in | Wt 209.0 lb

## 2018-01-21 DIAGNOSIS — E039 Hypothyroidism, unspecified: Secondary | ICD-10-CM

## 2018-01-21 LAB — T4, FREE: Free T4: 1.25 ng/dL (ref 0.60–1.60)

## 2018-01-21 LAB — TSH: TSH: 0.06 u[IU]/mL — AB (ref 0.35–4.50)

## 2018-01-21 MED ORDER — LEVOTHYROXINE SODIUM 150 MCG PO TABS: 150.0000 ug | ORAL_TABLET | Freq: Every day | ORAL | 1 refills | Status: DC

## 2018-01-21 NOTE — Patient Instructions (Signed)
We will call you with your lab results and refill your medication at that time.  Time of follow-up will be dependent on whether or not we change your dosage.

## 2018-01-21 NOTE — Progress Notes (Signed)
Zachary Little is a 37 y.o. male is here to discuss: Hypothyroidism  I acted as a Education administrator for [employer], PA-C Zachary Pickler, LPN  History of Present Illness:   Chief Complaint  Patient presents with  . Hypothyroidism    Thyroid Problem  Presents for follow-up visit. Patient reports no anxiety, cold intolerance, constipation, depressed mood, diaphoresis, diarrhea, dry skin, fatigue, hair loss, heat intolerance, hoarse voice, leg swelling, nail problem, palpitations, tremors, visual change, weight gain or weight loss. (Pt here for follow up Thyroid medication decreased in October due to over supplement. Pt was put on Levothyroxine 175 mcg and was supposed to repeat labs in 4-6 weeks but did not schedule follow-up visit. Pt is tolerating medication well. No adverse affects. ) The symptoms have been stable.   Lab Results  Component Value Date   TSH 0.03 (L) 08/26/2017   Was decreased from 200 mcg to 175 mcg at last visit.  There are no preventive care reminders to display for this patient.  Past Medical History:  Diagnosis Date  . Anxiety   . Blood transfusion without reported diagnosis 2010   During Chemo Treatments  . Leukemia, acute myeloid, in remission ([place]) 2010  . Thyroid disease      Social History   Socioeconomic History  . Marital status: Single    Spouse name: Not on file  . Number of children: Not on file  . Years of education: Not on file  . Highest education level: Not on file  Social Needs  . Financial resource strain: Not on file  . Food insecurity - worry: Not on file  . Food insecurity - inability: Not on file  . Transportation needs - medical: Not on file  . Transportation needs - non-medical: Not on file  Occupational History  . Not on file  Tobacco Use  . Smoking status: Never Smoker  . Smokeless tobacco: Never Used  Substance and Sexual Activity  . Alcohol use: No  . Drug use: No  . Sexual activity: Yes    Partners: Female  Other  Topics Concern  . 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 No pertinent surgical history.  Family History  Problem Relation Age of Onset  . Diabetes Maternal Grandfather   . Diabetes Paternal Grandfather     PMHx, SurgHx, SocialHx, FamHx, Medications, and Allergies were reviewed in the Visit Navigator and updated as appropriate.   Patient Active Problem List   Diagnosis Date Noted  . Abnormal gait 01/02/2018  . 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 Osteoarthritis of spine 11/26/2017  . Patellofemoral pain syndrome of left knee 10/31/2017  . Thoracic spondylosis 10/28/2017  . Thoracic radiculopathy 05/23/2017  . Meralgia paresthetica of right side 05/23/2017  . AML (acute myeloid leukemia) in remission ([place]) 05/16/2015  . Hypothyroidism (acquired) 05/16/2015  . 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 Smoking status: Never Smoker  . Smokeless tobacco: Never Used  Substance Use Topics  . Alcohol use: No  . Drug use: No    Current Medications and Allergies:    Current Outpatient Medications:  .  levothyroxine (SYNTHROID, LEVOTHROID) 175 MCG tablet, Take 1 tablet (175 mcg total) by mouth daily before breakfast., Disp: 60 tablet, Rfl: 0 .  tadalafil (CIALIS)  10 MG tablet, Take 1 tablet (10 mg total) by mouth daily as needed for erectile dysfunction. (Patient not taking: Reported on 01/21/2018), Disp: 10 tablet, Rfl: 1  No Known Allergies  Review of Systems   Review of Systems  Constitutional: Negative for diaphoresis, fatigue, weight gain and weight loss.  HENT: Negative for hoarse voice.   Cardiovascular: Negative for palpitations.  Gastrointestinal: Negative for constipation and diarrhea.  Neurological: Negative for tremors.  Endo/Heme/Allergies: Negative for cold intolerance and heat intolerance.    Psychiatric/Behavioral: The patient is not nervous/anxious.     Vitals:   Vitals:   01/21/18 1300  BP: 108/70  Pulse: 72  Temp: 98.1 F (36.7 C)  TempSrc: Oral  SpO2: 98%  Weight: 209 lb (94.8 kg)  Height: 5\' 8"  (1.727 m)     Body mass index is 31.78 kg/m.   Physical Exam:    Physical Exam  Constitutional: He appears well-developed. He is cooperative.  Non-toxic appearance. He does not have a sickly appearance. He does not appear ill. No distress.  Cardiovascular: Normal rate, regular rhythm, S1 normal, S2 normal, normal heart sounds and normal pulses.  No LE edema  Pulmonary/Chest: Effort normal and breath sounds normal.  Neurological: He is alert. GCS eye subscore is 4. GCS verbal subscore is 5. GCS motor subscore is 6.  Skin: Skin is warm, dry and intact.  Psychiatric: He has a normal mood and affect. His speech is normal and behavior is normal.  Nursing note and vitals reviewed.      Assessment and Plan:    Zachary Little was seen today for hypothyroidism.  Diagnoses and all orders for this visit:  Hypothyroidism, unspecified type -     T4, free -     TSH   Currently on 175 mcg synthroid. Will re-check today and adjust dosage prn. Patient verbalized understanding. If stable dose, f/u in 6 months. If need to change dose, f/u in 1-2 months.    . Reviewed expectations re: course of current medical issues. . Discussed self-management of symptoms. . Outlined signs and symptoms indicating need for more acute intervention. . Patient verbalized understanding and all questions were answered. . See orders for this visit as documented in the electronic medical record. . Patient received an After Visit Summary.  CMA or LPN served as scribe during this visit. History, Physical, and Plan performed by medical provider. Documentation and orders reviewed and attested to.  Zachary Coke, PA-C [place] [place] [facility] 01/21/2018  Follow-up: No Follow-up on file.

## 2018-01-22 ENCOUNTER — Encounter: Payer: Self-pay | Admitting: Physician Assistant

## 2018-01-22 MED FILL — LEVOTHYROXINE 150 MCG TAB: 150 | 30 days supply | Qty: 30 | Fill #0

## 2018-01-26 ENCOUNTER — Other Ambulatory Visit: Payer: Self-pay | Admitting: Physician Assistant

## 2018-01-26 DIAGNOSIS — M25569 Pain in unspecified knee: Secondary | ICD-10-CM

## 2018-01-30 ENCOUNTER — Ambulatory Visit (INDEPENDENT_AMBULATORY_CARE_PROVIDER_SITE_OTHER): Payer: No Typology Code available for payment source | Admitting: Physician Assistant

## 2018-01-30 ENCOUNTER — Ambulatory Visit: Payer: No Typology Code available for payment source | Admitting: Neurology

## 2018-01-30 ENCOUNTER — Encounter: Payer: Self-pay | Admitting: Physician Assistant

## 2018-01-30 ENCOUNTER — Encounter: Payer: Self-pay | Admitting: Neurology

## 2018-01-30 VITALS — BP 104/70 | HR 87 | Temp 98.4°F | Ht 68.0 in | Wt 211.0 lb

## 2018-01-30 VITALS — BP 90/62 | HR 87 | Ht 68.0 in | Wt 205.0 lb

## 2018-01-30 DIAGNOSIS — G5711 Meralgia paresthetica, right lower limb: Secondary | ICD-10-CM | POA: Diagnosis not present

## 2018-01-30 DIAGNOSIS — M546 Pain in thoracic spine: Secondary | ICD-10-CM | POA: Diagnosis not present

## 2018-01-30 DIAGNOSIS — J069 Acute upper respiratory infection, unspecified: Secondary | ICD-10-CM

## 2018-01-30 DIAGNOSIS — G8929 Other chronic pain: Secondary | ICD-10-CM

## 2018-01-30 LAB — POC INFLUENZA A&B (BINAX/QUICKVUE)
INFLUENZA A, POC: NEGATIVE
Influenza B, POC: NEGATIVE

## 2018-01-30 NOTE — Patient Instructions (Signed)
It was great to see you!  You have a viral upper respiratory infection. Antibiotics are not needed for this.  Viral infections usually take 7-10 days to resolve.  The cough can last a few weeks to go away.   Push fluids and get plenty of rest. Please return if you are not improving as expected, or if you have high fevers (>101.5) or difficulty swallowing or worsening productive cough.  Call clinic with questions.  I hope you start feeling better soon!  

## 2018-01-30 NOTE — Patient Instructions (Signed)
Try the Lyrica 75mg  twice daily.  If effective, you can follow up with your PCP.  I would check with your PCP about other possible causes of pain in the torso.

## 2018-01-30 NOTE — Progress Notes (Signed)
Zachary Little is a 37 y.o. male here for a new problem.  I acted as a Education administrator for [employer], PA-C Zachary Pickler, LPN  History of Present Illness:   Chief Complaint  Patient presents with  . Sore Throat    Sore Throat   This is a new problem. The current episode started yesterday. The problem has been gradually worsening. Neither side of throat is experiencing more pain than the other. There has been no fever. The pain is at a severity of 5/10. The pain is moderate. Associated symptoms include coughing and headaches. Pertinent negatives include no abdominal pain, congestion, diarrhea, drooling, ear discharge, ear pain, hoarse voice, plugged ear sensation, neck pain, shortness of breath, swollen glands, trouble swallowing or vomiting. Associated symptoms comments: Body aches. Treatments tried: Dayquil. The treatment provided moderate relief.   His wife is a Marine scientist and he suspects that he was possibly exposed to something via her.  His appetite is good.   Past Medical History:  Diagnosis Date  . Anxiety   . Blood transfusion without reported diagnosis 2010   During Chemo Treatments  . 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 Thyroid disease      Social History   Socioeconomic History  . Marital status: Single    Spouse name: Not on file  . Number of children: Not on file  . Years of education: Not on file  . Highest education level: Not on file  Social Needs  . Financial resource strain: Not on file  . Food insecurity - worry: Not on file  . Food insecurity - inability: Not on file  . Transportation needs - medical: Not on file  . Transportation needs - non-medical: Not on file  Occupational History  . Not on file  Tobacco Use  . Smoking status: Never Smoker  . Smokeless tobacco: Never Used  Substance and Sexual Activity  . Alcohol use: No  . Drug use: No  . Sexual activity: Yes    Partners: Female  Other Topics Concern  . 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 No pertinent surgical history.  Family History  Problem Relation Age of Onset  . Diabetes Maternal Grandfather   . Diabetes Paternal Grandfather     No Known Allergies  Current Medications:   Current Outpatient Medications:  .  levothyroxine (SYNTHROID, LEVOTHROID) 150 MCG tablet, Take 1 tablet (150 mcg total) by mouth daily., Disp: 30 tablet, Rfl: 1 .  tadalafil (CIALIS) 10 MG tablet, Take 1 tablet (10 mg total) by mouth daily as needed for erectile dysfunction., Disp: 10 tablet, Rfl: 1   Review of Systems:   Review of Systems  HENT: Negative for congestion, drooling, ear discharge, ear pain, hoarse voice and trouble swallowing.   Respiratory: Positive for cough. Negative for shortness of breath.   Gastrointestinal: Negative for abdominal pain, diarrhea and vomiting.  Musculoskeletal: Negative for neck pain.  Neurological: Positive for headaches.    Vitals:   Vitals:   01/30/18 1423  BP: 104/70  Pulse: 87  Temp: 98.4 F (36.9 C)  TempSrc: Oral  SpO2: 97%  Weight: 211 lb (95.7 kg)  Height: 5\' 8"  (1.727 m)     Body mass index is 32.08 kg/m.  Physical Exam:   Physical Exam  Constitutional: He appears well-developed. He is cooperative.  Non-toxic appearance. He does not have a sickly appearance. He does not appear ill. No distress.  HENT:  Head: Normocephalic and atraumatic.  Right Ear: Tympanic membrane, external ear and ear canal normal. Tympanic membrane is not erythematous, not retracted and not bulging.  Left Ear: Tympanic membrane, external ear and ear canal normal. Tympanic membrane is not erythematous, not retracted and not bulging.  Nose: Nose normal. Right sinus exhibits no maxillary sinus tenderness and no frontal sinus tenderness. Left sinus exhibits no maxillary sinus tenderness and no frontal sinus tenderness.  Mouth/Throat:  Uvula is midline and mucous membranes are normal. No posterior oropharyngeal edema or posterior oropharyngeal erythema. Tonsils are 1+ on the right. Tonsils are 1+ on the left. No tonsillar exudate.  Eyes: Conjunctivae and lids are normal.  Neck: Trachea normal.  Cardiovascular: Normal rate, regular rhythm, S1 normal, S2 normal and normal heart sounds.  Pulmonary/Chest: Effort normal and breath sounds normal. He has no decreased breath sounds. He has no wheezes. He has no rhonchi. He has no rales.  Lymphadenopathy:    He has no cervical adenopathy.  Neurological: He is alert.  Skin: Skin is warm, dry and intact.  Psychiatric: He has a normal mood and affect. His speech is normal and behavior is normal.  Nursing note and vitals reviewed.   Results for orders placed or performed in visit on 01/30/18  POC Influenza A&B(BINAX/QUICKVUE)  Result Value Ref Range   Influenza A, POC Negative Negative   Influenza B, POC Negative Negative    Results for orders placed or performed in visit on 01/30/18  POC Influenza A&B(BINAX/QUICKVUE)  Result Value Ref Range   Influenza A, POC Negative Negative   Influenza B, POC Negative Negative     Assessment and Plan:    Zachary Little was seen today for sore throat.  Diagnoses and all orders for this visit:  Upper respiratory tract infection, unspecified type -     POC Influenza A&B(BINAX/QUICKVUE)   No red flags on exam.  Flu negative. No indication for antibiotics at this time. Discussed Mucinex, Delsym, and Cepacol. Discussed taking medications as prescribed. Reviewed return precautions including worsening fever, SOB, worsening cough or other concerns. Push fluids and rest. I recommend that patient follow-up if symptoms worsen or persist despite treatment x 7-10 days, sooner if needed.  . Reviewed expectations re: course of current medical issues. . Discussed self-management of symptoms. . Outlined signs and symptoms indicating need for more acute  intervention. . Patient verbalized understanding and all questions were answered. . See orders for this visit as documented in the electronic medical record. . Patient received an After-Visit Summary.  CMA or LPN served as scribe during this visit. History, Physical, and Plan performed by medical provider. Documentation and orders reviewed and attested to.  [name], PA-C

## 2018-01-30 NOTE — Progress Notes (Signed)
NEUROLOGY FOLLOW UP OFFICE NOTE  Zachary Little [id number]  HISTORY OF PRESENT ILLNESS: Zachary Little is a 37 year old left-handed male with hypothyroidism and history of AML in remission who follows up for evaluation of right meralgia paresthetica and left thoracic radiculopathy.     UPDATE: He has been treated by Dr. Paulla Fore of Sports Medicine with [facility].  MRI of the thoracic spine was performed on 12/21/17, which was personally reviewed and was normal.  He also endorsed weakness in the arms when he holds them up, such as while shaving.  MRI of cervical spine from 11/26/17 was personally reviewed and revealed small central disc bulge at C5-6 causing no significant stenosis, otherwise unremarkable.   HISTORY: In 2016, he developed numbness and tingling in the lateral aspect of his right thigh.  It is constant but he particularly notices it when he lays supine in bed or is standing on uneven ground.  He denies low back pain or radicular pain down the leg.  He denies weakness of the extremity.  He denies symptoms in the left leg.  He denies bladder incontinence or retention, although he has increased urgency.   X-ray of right thigh/femur from 06/05/15 was unremarkable.  Lower extremity venous doppler from 06/05/15 was negative for DVT.  Lumbar spine Xrays from 02/06/16 were negative.  Labs from 03/27/16 included TSH 4.680, free T4 1.46, B12 291, CK 66, vitamin D 56.6, A NCV-EMG from 06/24/16 was reportedly normal.  MRI of lumbar spine without contrast from 07/09/16 was normal. He was diagnosed with right sided meralgia paresthetica and underwent a right lateral femoral cutaneous nerve block on 01/30/17, which was ineffective.  He denies wearing tight fitting belts or pants at the waist.     In February 2018, he developed sudden onset left sided thoracic pain involving the left flank and rib.  It was spontaneous and not preceded by any injury or strenuous activity.  It occurs from the medial lower border of  the scapula and radiates around to above the inguinal region.  It is a moderate pain, but it is not a sharp, stabbing, electric or burning pain.  There is no associated paresthesias or rash.  It is noticeable when sitting at his desk or driving in the car.  When he is feeling the pain, it can be intensified if he palpates below his rib cage.  Otherwise, it is not reproducible.  He denies abdominal cramps, constipation or diarrhea.  CT of abdomen and pelvis from 01/17/17 was unremarkable.  A thoracic spine and bilateral ribs/chest X-rays from 03/27/17 were unremarkable.    He was diagnosed with possible left sided thoracic radiculopathy.  Labs from 05/22/17 were unremarkable, including CBC, CMP, Sed rate of 1 and CRP 0.4.  Gabapentin and Cymbalta were ineffective.  PAST MEDICAL HISTORY: Past Medical History:  Diagnosis Date  . Anxiety   . Blood transfusion without reported diagnosis 2010   During Chemo Treatments  . 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 Thyroid disease     MEDICATIONS: Current Outpatient Medications on File Prior to Visit  Medication Sig Dispense Refill  . levothyroxine (SYNTHROID, LEVOTHROID) 150 MCG tablet Take 1 tablet (150 mcg total) by mouth daily. 30 tablet 1  . tadalafil (CIALIS) 10 MG tablet Take 1 tablet (10 mg total) by mouth daily as needed for erectile dysfunction. 10 tablet 1   No current facility-administered medications on file prior to visit.     ALLERGIES: No Known Allergies  FAMILY HISTORY: Family History  Problem Relation Age of Onset  . Diabetes Maternal Grandfather   . Diabetes Paternal Grandfather     SOCIAL HISTORY: Social History   Socioeconomic History  . Marital status: Single    Spouse name: Not on file  . Number of children: Not on file  . Years of education: Not on file  . Highest education level: Not on file  Social Needs  . Financial resource strain: Not on file  . Food insecurity - worry: Not on file  . Food  insecurity - inability: Not on file  . Transportation needs - medical: Not on file  . Transportation needs - non-medical: Not on file  Occupational History  . Not on file  Tobacco Use  . Smoking status: Never Smoker  . Smokeless tobacco: Never Used  Substance and Sexual Activity  . Alcohol use: No  . Drug use: No  . Sexual activity: Yes    Partners: Female  Other Topics Concern  . 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   Married in September, no kids   No pets    REVIEW OF SYSTEMS: Constitutional: No fevers, chills, or sweats, no generalized fatigue, change in appetite Eyes: No visual changes, double vision, eye pain Ear, nose and throat: No hearing loss, ear pain, nasal congestion, sore throat Cardiovascular: No chest pain, palpitations Respiratory:  No shortness of breath at rest or with exertion, wheezes GastrointestinaI: No nausea, vomiting, diarrhea, abdominal pain, fecal incontinence Genitourinary:  No dysuria, urinary retention or frequency Musculoskeletal:  Pain in torso Integumentary: No rash, pruritus, skin lesions Neurological: as above Psychiatric: No depression, insomnia, anxiety Endocrine: No palpitations, fatigue, diaphoresis, mood swings, change in appetite, change in weight, increased thirst Hematologic/Lymphatic:  No purpura, petechiae. Allergic/Immunologic: no itchy/runny eyes, nasal congestion, recent allergic reactions, rashes  PHYSICAL EXAM: Vitals:   01/30/18 1120  BP: 90/62  Pulse: 87   General: No acute distress.  Patient appears well-groomed.   Head:  Normocephalic/atraumatic Eyes:  Fundi examined but not visualized Neck: supple, no paraspinal tenderness, full range of motion Heart:  Regular rate and rhythm Lungs:  Clear to auscultation bilaterally Back: No paraspinal tenderness Neurological Exam: alert and oriented to person, place, and time. Attention span and concentration intact, recent and remote  memory intact, fund of knowledge intact.  Speech fluent and not dysarthric, language intact.  CN II-XII intact. Bulk and tone normal, muscle strength 5/5 throughout.  Sensation to light touch  intact.  Deep tendon reflexes 2+ throughout.  Finger to nose testing intact.  Gait normal  IMPRESSION: 1.  Right-sided meralgia paresthetica.  No structural cause identified on imaging.  Symptoms and imaging not suggestive of lumbar radiculopathy.  Treatment is symptomatic management as there is no correctable cause found on imaging. 2.  Left sided thoracic pain.  Pain description is vague.  MRI of thoracic spine reveals no associated disc protrusion or stenosis.  I do not suspect neuralgia at this point. 3.  He mentions proximal arm weakness.  Strength is intact on exam.  MRI of cervical spine is unrevealing.  PLAN: I provided him samples of Lyrica 75mg  twice daily to see if it helps with the meralgia paresthetica discomfort (and possibly the thoracic pain).  He should follow up with his PCP.  17 minutes spent face to face with patient, over 50% spent discussing possible diagnoses and management.  Metta Clines, DO  CC:  [name],  PA-C

## 2018-02-06 ENCOUNTER — Encounter: Payer: Self-pay | Admitting: Sports Medicine

## 2018-02-06 ENCOUNTER — Ambulatory Visit: Payer: No Typology Code available for payment source | Admitting: Sports Medicine

## 2018-02-06 VITALS — BP 94/76 | HR 84 | Ht 68.0 in | Wt 208.8 lb

## 2018-02-06 DIAGNOSIS — M25562 Pain in left knee: Secondary | ICD-10-CM | POA: Diagnosis not present

## 2018-02-06 DIAGNOSIS — M4724 Other spondylosis with radiculopathy, thoracic region: Secondary | ICD-10-CM | POA: Diagnosis not present

## 2018-02-06 DIAGNOSIS — G8929 Other chronic pain: Secondary | ICD-10-CM | POA: Diagnosis not present

## 2018-02-06 DIAGNOSIS — R269 Unspecified abnormalities of gait and mobility: Secondary | ICD-10-CM

## 2018-02-06 NOTE — Assessment & Plan Note (Signed)
Medial. Likely degenerative medial meniscal tear vs PFS. Will try DJO Reaction Brace If any lack of improvement will order MRI.  Given no reproducible mechanical symptoms surgical intervention can be delayed.

## 2018-02-06 NOTE — Progress Notes (Signed)
  Zachary Bond. [place], [place] [facility] - 37 y.o. male MRN [id number]  Date of birth: 1981-03-06  Visit Date: 02/06/2018  PCP: [name], PA   Referred by: [name], [place]   Scribe for today's visit: Josepha Pigg, CMA     SUBJECTIVE:  Zachary Little is here for Follow-up (thoracic spine pain, L knee pain)  Compared to the last office visit, his previously described symptoms show no change. He is still having pain when sitting with his knee bent. He doesn't have pain with walking or running. He denies swelling around the knee. Pain is mostly medial. He denies clicking and popping.  Current symptoms are mild-moderate (4/10) & are radiating to L thigh and L foot when sitting with his knee to the side.  He has been taking Meloxicam prn, rarely, with minimal relief. He had steroid injection in the past and got little relief.    Compared to the last office visit, his previously described symptoms show no change. He was seen by a neurologist and they released him and didn't feel like they were able to help him, was told to f/u with Dr. Paulla Fore.  Current symptoms are moderate & are nonradiating. Pain is worse when lying down.  He has gotten some relief from the pain when his wife massages his back.    ROS Denies night time disturbances. Denies fevers, chills, or night sweats. Denies unexplained weight loss. Reports personal history of cancer. Denies changes in bowel or bladder habits. Denies recent unreported falls. Denies new or worsening dyspnea or wheezing. Denies headaches or dizziness.  Denies numbness, tingling or weakness  In the extremities.  Denies dizziness or presyncopal episodes Denies lower extremity edema    HISTORY & PERTINENT PRIOR DATA:  Prior History reviewed and updated per electronic medical record.  Significant history, findings, studies and interim changes include:  reports that  has never smoked. he has never used smokeless tobacco. No results for input(s): HGBA1C, LABURIC, CREATINE in the last 8760 hours. No specialty comments available. Problem  Knee Pain   10/01/2017: 2 view x-ray left knee, normal   Thoracic Spondylosis   XR thoracic spine - 03/27/2017: No significant malalignment with multilevel degenerative changes appreciated. MRI T-spine 12/21/17 - Normal examination. No abnormality seen to explain back or flank pain. MRI c-spine  11/26/17 - IMPRESSION: Newly seen central disc bulge at C5-6 that narrows the ventral subarachnoid space but does not compress the cord or show foraminal extension. Other levels are normal.         Please see additional documentation for Objective, Assessment and Plan sections. Pertinent additional documentation may be included in corresponding procedure notes, imaging studies, problem based documentation and patient instructions. Please see these sections of the encounter for additional information regarding this visit.  CMA/ATC served as Education administrator during this visit. History, Physical, and Plan performed by medical provider. Documentation and orders reviewed and attested to.      Gerda Diss, [facility] Physician

## 2018-02-06 NOTE — Assessment & Plan Note (Signed)
This is unchanged but did seem to be relieved with massage suggesting this is likely somewhat myofascial versus muscular in nature.  I agree okay to wean off of neuro modulating medications.  He is yet to take the Lyrica previously prescribed with an acute illness but is going to try this in the next several days.  Could consider Piezowave Therapy if persistent lack of improvement.

## 2018-02-06 NOTE — Progress Notes (Signed)
  OBJECTIVE:  VS:  HT:5\' 8"  (172.7 cm)   WT:208 lb 12.8 oz (94.7 kg)  BMI:31.76    BP:94/76  HR:84bpm  TEMP: ( )  RESP:98 %   PHYSICAL EXAM: WDWN, Non-toxic appearing. Psychiatric: Alert & appropriately interactive.  Not depressed or anxious appearing. Respiratory: No increased work of breathing.  Trachea Midline Eyes: Pupils are equal.  EOM intact without nystagmus.  No scleral icterus  NEUROVASCULAR exam: No clubbing or cyanosis appreciated No significant venous stasis changes normal, less than 2 seconds    Right Knee  Alignment & Contours: normal Skin: No overlying erythema/ecchymosis Effusion: none   Generalized Synovitis: none Knee Tenderness: Medial joint line Gait: normal   RANGE OF MOTION & STRENGTH  EXTENSION: Normal  with no pain Strength: Normal FLEXION: Normal with no pain Strength: Normal Recruitment pattern: TFL predominant   LIGAMENTOUS TESTING  Varus & Valgus Strain: stable to testing Anterior & Posterior Drawer: stable to testing: Lachman's: stable to testing   SPECIALITY TESTING:  Crepitation with Patellar Grind: minimal Patellar Apprehension: No J Sign: Negative Mcmurray's: Negative Thessaly: Negative       ASSESSMENT & PLAN:   1. Chronic pain of left knee   2. Osteoarthritis of spine with radiculopathy, thoracic region   3. Abnormal gait    Orders & Meds: No orders of the defined types were placed in this encounter.  No orders of the defined types were placed in this encounter.   PLAN:  Send a Therapist, music to let us know if your knee pain isn't better.  We can order an MRI at that time if you are still having severe symptoms. Knee pain Medial. Likely degenerative medial meniscal tear vs PFS. Will try DJO Reaction Brace If any lack of improvement will order MRI.  Given no reproducible mechanical symptoms surgical intervention can be delayed.  Thoracic spondylosis This is unchanged but did seem to be relieved with massage  suggesting this is likely somewhat myofascial versus muscular in nature.  I agree okay to wean off of neuro modulating medications.  He is yet to take the Lyrica previously prescribed with an acute illness but is going to try this in the next several days.  Could consider Piezowave Therapy if persistent lack of improvement.   Follow-up: Return if symptoms worsen or fail to improve.

## 2018-02-13 ENCOUNTER — Ambulatory Visit: Payer: No Typology Code available for payment source | Admitting: Sports Medicine

## 2018-02-18 MED FILL — LEVOTHYROXINE 150 MCG TAB: 150 | 30 days supply | Qty: 30 | Fill #1

## 2018-02-21 ENCOUNTER — Encounter: Payer: Self-pay | Admitting: Sports Medicine

## 2018-02-23 ENCOUNTER — Encounter: Payer: Self-pay | Admitting: Sports Medicine

## 2018-02-24 ENCOUNTER — Other Ambulatory Visit: Payer: Self-pay

## 2018-02-24 DIAGNOSIS — M25562 Pain in left knee: Secondary | ICD-10-CM

## 2018-02-25 ENCOUNTER — Telehealth: Payer: Self-pay

## 2018-02-25 NOTE — Telephone Encounter (Signed)
Called pt and LM for him to call back and schedule an appt for orthotics and noted that anyone can schedule that pt for him at his convenience.

## 2018-02-25 NOTE — Telephone Encounter (Signed)
Correction to last message - anyone can schedule that appt for him at his convenience

## 2018-02-25 NOTE — Telephone Encounter (Signed)
Copied from [place]. Topic: General - Other >> Feb 25, 2018 10:42 AM Margot Ables wrote: Reason for CRM: pt is wanting to schedule fitting for custom orthotics. Please call to schedule.

## 2018-02-25 NOTE — Telephone Encounter (Signed)
Pt has been sch for 02-26-18

## 2018-02-26 ENCOUNTER — Encounter: Payer: Self-pay | Admitting: Sports Medicine

## 2018-02-26 ENCOUNTER — Ambulatory Visit (INDEPENDENT_AMBULATORY_CARE_PROVIDER_SITE_OTHER): Payer: No Typology Code available for payment source | Admitting: Sports Medicine

## 2018-02-26 DIAGNOSIS — M222X2 Patellofemoral disorders, left knee: Secondary | ICD-10-CM | POA: Diagnosis not present

## 2018-02-26 DIAGNOSIS — G8929 Other chronic pain: Secondary | ICD-10-CM | POA: Diagnosis not present

## 2018-02-26 DIAGNOSIS — Q667 Congenital pes cavus, unspecified foot: Secondary | ICD-10-CM

## 2018-02-26 DIAGNOSIS — M25562 Pain in left knee: Secondary | ICD-10-CM | POA: Diagnosis not present

## 2018-02-26 DIAGNOSIS — R269 Unspecified abnormalities of gait and mobility: Secondary | ICD-10-CM | POA: Diagnosis not present

## 2018-02-26 NOTE — Progress Notes (Signed)
Read is pending for a procedure only visit for custom cushion orthotics.  These fabricated as below.  Please see previous office notes for further documentation.  PLAN: Custom orthotics fabricated today as below  PROCEDURE: CUSTOM ORTHOTIC FABRICATION Patient's underlying musculoskeletal conditions are directly related to poor biomechanics and will benefit from a functional custom orthotic.  There are no significant foot deformities that complicate the use of a custom orthotic.  The patient was fitted for a standard, cushioned, semi-rigid orthotic. The orthotic was heated & placed on the orthotic stand. The patient was positioned in subtalar neutral position and 10 of ankle dorsiflexion and weight bearing stance on the heated orthotic blank. After completion of the molding a base was applied to the orthotic blank. The orthotic was ground to a stable position for weightbearing. The patient ambulated in these and reported they were comfortable without pressure spots.              BLANK:  Size 8 - Standard Cushioned                 BASE:  Blue EVA      POSTINGS:  n/a   Follow-up: as previously scheduled  Pertinent documentation may be included in additional procedure notes, imaging studies, problem based documentation and patient instructions. Please see these sections of the encounter for additional information regarding this visit.     Gerda Diss, [facility] Physician

## 2018-03-02 ENCOUNTER — Encounter: Payer: Self-pay | Admitting: Sports Medicine

## 2018-03-08 ENCOUNTER — Ambulatory Visit
Admission: RE | Admit: 2018-03-08 | Discharge: 2018-03-08 | Disposition: A | Discharge status: Home / Self Care | Payer: No Typology Code available for payment source | Source: Ambulatory Visit | Attending: Sports Medicine | Admitting: Sports Medicine

## 2018-03-08 DIAGNOSIS — M25562 Pain in left knee: Secondary | ICD-10-CM

## 2018-03-09 ENCOUNTER — Ambulatory Visit (INDEPENDENT_AMBULATORY_CARE_PROVIDER_SITE_OTHER): Payer: No Typology Code available for payment source | Admitting: Physician Assistant

## 2018-03-09 ENCOUNTER — Encounter: Payer: Self-pay | Admitting: Physician Assistant

## 2018-03-09 ENCOUNTER — Other Ambulatory Visit: Payer: Self-pay

## 2018-03-09 ENCOUNTER — Other Ambulatory Visit: Payer: Self-pay | Admitting: Physician Assistant

## 2018-03-09 VITALS — BP 116/72 | HR 70 | Temp 97.6°F | Ht 68.0 in | Wt 210.6 lb

## 2018-03-09 DIAGNOSIS — E039 Hypothyroidism, unspecified: Secondary | ICD-10-CM

## 2018-03-09 DIAGNOSIS — Z13220 Encounter for screening for lipoid disorders: Secondary | ICD-10-CM | POA: Diagnosis not present

## 2018-03-09 DIAGNOSIS — C9201 Acute myeloblastic leukemia, in remission: Secondary | ICD-10-CM | POA: Diagnosis not present

## 2018-03-09 DIAGNOSIS — E669 Obesity, unspecified: Secondary | ICD-10-CM | POA: Insufficient documentation

## 2018-03-09 DIAGNOSIS — F325 Major depressive disorder, single episode, in full remission: Secondary | ICD-10-CM | POA: Diagnosis not present

## 2018-03-09 DIAGNOSIS — Z0001 Encounter for general adult medical examination with abnormal findings: Secondary | ICD-10-CM

## 2018-03-09 DIAGNOSIS — N529 Male erectile dysfunction, unspecified: Secondary | ICD-10-CM | POA: Diagnosis not present

## 2018-03-09 DIAGNOSIS — Z136 Encounter for screening for cardiovascular disorders: Secondary | ICD-10-CM | POA: Diagnosis not present

## 2018-03-09 DIAGNOSIS — E538 Deficiency of other specified B group vitamins: Secondary | ICD-10-CM

## 2018-03-09 DIAGNOSIS — M2550 Pain in unspecified joint: Secondary | ICD-10-CM

## 2018-03-09 LAB — LIPID PANEL
CHOLESTEROL: 127 mg/dL (ref 0–200)
HDL: 40.4 mg/dL (ref >39.00)
LDL Cholesterol: 59 mg/dL (ref 0–99)
NonHDL: 86.94
Total CHOL/HDL Ratio: 3
Triglycerides: 139 mg/dL (ref 0.0–149.0)
VLDL: 27.8 mg/dL (ref 0.0–40.0)

## 2018-03-09 LAB — COMPREHENSIVE METABOLIC PANEL
ALK PHOS: 57 U/L (ref 39–117)
ALT: 15 U/L (ref 0–53)
AST: 11 U/L (ref 0–37)
Albumin: 4.4 g/dL (ref 3.5–5.2)
BUN: 13 mg/dL (ref 6–23)
CO2: 27 mEq/L (ref 19–32)
Calcium: 9.6 mg/dL (ref 8.4–10.5)
Chloride: 104 mEq/L (ref 96–112)
Creatinine, Ser: 0.8 mg/dL (ref 0.40–1.50)
GFR: 116.38 mL/min (ref >60.00)
Glucose, Bld: 90 mg/dL (ref 70–99)
POTASSIUM: 4.3 meq/L (ref 3.5–5.1)
SODIUM: 140 meq/L (ref 135–145)
Total Bilirubin: 0.9 mg/dL (ref 0.2–1.2)
Total Protein: 6.8 g/dL (ref 6.0–8.3)

## 2018-03-09 LAB — CBC WITH DIFFERENTIAL/PLATELET
Basophils Absolute: 0 10*3/uL (ref 0.0–0.1)
Basophils Relative: 0.6 % (ref 0.0–3.0)
EOS PCT: 1.6 % (ref 0.0–5.0)
Eosinophils Absolute: 0.1 10*3/uL (ref 0.0–0.7)
HCT: 48.7 % (ref 39.0–52.0)
Hemoglobin: 16.9 g/dL (ref 13.0–17.0)
LYMPHS PCT: 28.1 % (ref 12.0–46.0)
Lymphs Abs: 1.8 10*3/uL (ref 0.7–4.0)
MCHC: 34.7 g/dL (ref 30.0–36.0)
MCV: 93.3 fl (ref 78.0–100.0)
MONO ABS: 0.7 10*3/uL (ref 0.1–1.0)
MONOS PCT: 11.1 % (ref 3.0–12.0)
Neutro Abs: 3.8 10*3/uL (ref 1.4–7.7)
Neutrophils Relative %: 58.6 % (ref 43.0–77.0)
Platelets: 195 10*3/uL (ref 150.0–400.0)
RBC: 5.22 Mil/uL (ref 4.22–5.81)
RDW: 13.4 % (ref 11.5–15.5)
WBC: 6.5 10*3/uL (ref 4.0–10.5)

## 2018-03-09 LAB — VITAMIN B12: VITAMIN B 12: 434 pg/mL (ref 211–911)

## 2018-03-09 LAB — T4, FREE: FREE T4: 1.23 ng/dL (ref 0.60–1.60)

## 2018-03-09 LAB — TSH: TSH: 0.67 u[IU]/mL (ref 0.35–4.50)

## 2018-03-09 MED ORDER — DIAZEPAM 5 MG PO TABS: 5.0000 mg | ORAL_TABLET | Freq: Two times a day (BID) | ORAL | 0 refills | Status: DC | PRN

## 2018-03-09 MED ORDER — LEVOTHYROXINE SODIUM 150 MCG PO TABS: 150.0000 ug | ORAL_TABLET | Freq: Every day | ORAL | 1 refills | Status: DC

## 2018-03-09 MED FILL — diazePAM 5 MG TABS: 5 | 15 days supply | Qty: 30 | Fill #0

## 2018-03-09 NOTE — Assessment & Plan Note (Signed)
Currently does not need refill. He will let us know if he needs this.

## 2018-03-09 NOTE — Assessment & Plan Note (Signed)
Currently in therapy, sees therapist q 2 weeks. Follow-up if needed.

## 2018-03-09 NOTE — Assessment & Plan Note (Signed)
Recheck today. 

## 2018-03-09 NOTE — Assessment & Plan Note (Signed)
Recheck CBC today. 

## 2018-03-09 NOTE — Progress Notes (Signed)
Subjective:    Zachary Little is a 37 y.o. male and is here for a comprehensive physical exam.  HPI  There are no preventive care reminders to display for this patient.  Acute Concerns: Arthralgias -- continues to get ongoing treatment with Dr. Teresa Coombs. Was briefly seeing Dr. Tomi Likens, but after last visit was told to trial Lyrica and follow-up with [place] for further evaluation of ongoing pain. Did try the Lyrica for two weeks but didn't have relief.  He reports because of his pain, he is not sleeping well.  He does state that he has had a positive ANA in the past.  I found this on [facility], 06/05/2015 --> +ANA with speckled pattern 1:160.  Reports that there was no further work-up of this.  Chronic Issues: Hypothyroidism -- currently on 150 mcg levothyroxine, was recently decreased from 175 mcg at end of February. Lab Results  Component Value Date   TSH 0.06 (L) 01/21/2018  Acute myeloid leukemia, in remission -- per oncologist, Dr. 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 His last bone marrow on 10/02/2010 continued to be without evidence of recurrent leukemia.  Depression -- took Cymbalta 10 mg x 1 week several years ago. Back in October 2018, he came into our office and wanted a recommendation for a therapist. Has been seeing a therapist, goes about every 2 weeks. Erectile dysfunction -- is not using the Cialis, doesn't need it at this time Vitamin B12 deficiency -- has been deficient in the past, unsure if whether or not he has had ever had injections   Health Maintenance: Immunizations -- UTD Colonoscopy -- n/a PSA -- n/a Diet -- frozen meals, soup, cereal, goes out to eat  occasionally Caffeine intake -- no caffeine Sleep habits -- terrible due to pain; takes "hours" to get to sleep Exercise -- 4 times a week, power walk and jog; weight loss meeting tonight Weight -- Weight: 210 lb 9.6 oz (95.5 kg) -- normal Mood -- good  Depression screen PHQ 2/9 03/09/2018  Decreased Interest 0  Down, Depressed, Hopeless 0  PHQ - 2 Score 0  Altered sleeping -  Tired, decreased energy -  Change in appetite -  Feeling bad or failure about yourself  -  Trouble concentrating -  Moving slowly or fidgety/restless -  Suicidal thoughts -  PHQ-9 Score -  Difficult doing work/chores -    Other providers/specialists: Dentist  Eye doctor  PMHx, SurgHx, SocialHx, Medications, and Allergies were reviewed in the Visit Navigator and updated as appropriate.   Past Medical History:  Diagnosis Date  . Anxiety   . Blood transfusion without reported diagnosis 2010   During Chemo Treatments  . Leukemia, acute myeloid, in remission ([place]) 2010  . Thyroid disease     History reviewed. No pertinent surgical history.   Family History  Problem Relation Age of Onset  . Diabetes Maternal Grandfather   . Diabetes Paternal Grandfather   . Prostate cancer Neg Hx   . Colon cancer Neg Hx     Social History   Tobacco Use  . Smoking status: Never Smoker  . Smokeless tobacco: Never Used  Substance Use Topics  . Alcohol use: No  . Drug use: No  Review of Systems:   Review of Systems  Constitutional: Negative for chills, fever, malaise/fatigue and weight loss.  HENT: Negative for hearing loss, sinus pain and sore throat.   Respiratory: Negative for cough and hemoptysis.   Cardiovascular: Negative for chest pain, palpitations, leg swelling and PND.  Gastrointestinal: Negative for abdominal pain, constipation, diarrhea, heartburn, nausea and vomiting.  Genitourinary: Negative for dysuria, frequency and urgency.  Musculoskeletal: Positive for back pain. Negative for myalgias  and neck pain.  Skin: Negative for itching and rash.  Neurological: Negative for dizziness, tingling, seizures and headaches.  Endo/Heme/Allergies: Negative for polydipsia.  Psychiatric/Behavioral: Negative for depression. The patient is not nervous/anxious.     Objective:   Vitals:   03/09/18 0856  BP: 116/72  Pulse: 70  Temp: 97.6 F (36.4 C)  SpO2: 97%   Body mass index is 32.02 kg/m.  General Appearance:  Alert, cooperative, no distress, appears stated age  Head:  Normocephalic, without obvious abnormality, atraumatic  Eyes:  PERRL, conjunctiva/corneas clear, EOM's intact, fundi benign, both eyes       Ears:  Normal TM's and external ear canals, both ears  Nose: Nares normal, septum midline, mucosa normal, no drainage    or sinus tenderness  Throat: Lips, mucosa, and tongue normal; teeth and gums normal  Neck: Supple, symmetrical, trachea midline, no adenopathy; thyroid:  No enlargement/tenderness/nodules; no carotit bruit or JVD  Back:   Symmetric, no curvature, ROM normal, no CVA tenderness; point tenderness with palpation to left lower scapular region; no bony tenderness; no decreased range of motion, no ecchymosis/erythema  Lungs:   Clear to auscultation bilaterally, respirations unlabored  Chest wall:  No tenderness or deformity  Heart:  Regular rate and rhythm, S1 and S2 normal, no murmur, rub   or gallop  Abdomen:   Soft, non-tender, bowel sounds active all four quadrants, no masses, no organomegaly  Extremities: Extremities normal, atraumatic, no cyanosis or edema  Prostate: Not done.   Skin: Skin color, texture, turgor normal, no rashes or lesions  Lymph nodes: Cervical, supraclavicular, and axillary nodes normal  Neurologic: CNII-XII grossly intact. Normal strength, sensation and reflexes throughout    Assessment/Plan:   Problem List Items Addressed This Visit      Endocrine   Hypothyroidism (acquired)    Re-check labs today, f/u based on results (if normal  see in 6 months, if abnormal, see in 1-2 months.)  Currently without any signs or symptoms of uncontrolled thyroid disease.      Relevant Orders   TSH   T4, free     Genitourinary   Erectile dysfunction    Currently does not need refill. He will let us know if he needs this.        Other   AML (acute myeloid leukemia) in remission ([place])    Re-check CBC today.      Relevant Medications   diazepam (VALIUM) 5 MG tablet   Vitamin B12 deficiency    Re-check today.      Relevant Orders   Vitamin B12   Depression, major, single episode, complete remission ([place])    Currently in therapy, sees therapist q 2 weeks. Follow-up if needed.      Relevant Medications   diazepam (VALIUM) 5 MG tablet   Other Relevant Orders   CBC with Differential/Platelet   Comprehensive metabolic panel    Other Visit Diagnoses    Encounter for general adult medical examination with abnormal findings    -  Primary Today patient counseled  on age appropriate routine health concerns for screening and prevention, each reviewed and up to date or declined. Immunizations reviewed and up to date or declined. Labs ordered and reviewed. Risk factors for depression reviewed and negative. Hearing function and visual acuity are intact. ADLs screened and addressed as needed. Functional ability and level of safety reviewed and appropriate. Education, counseling and referrals performed based on assessed risks today. Patient provided with a copy of personalized plan for preventive services.    Encounter for lipid screening for cardiovascular disease       Relevant Orders   Lipid panel   Obesity Continue diet and exercise as able. He continues to have knee pain, which he is going to pursue a second opinion about.  Arthralgia, unspecified joint     I am going to refer him to rheumatology due to his history of positive ANA in the past. Briefly entertained obtaining rheumatology labs today, however I will defer to  rheumatology at this time.  We discussed this with Dr. Teresa Coombs who was in agreement as well.   Relevant Orders   Ambulatory referral to Rheumatology       Well Adult Exam: Labs ordered: Yes. Patient counseling was done. See below for items discussed. Discussed the patient's BMI.  The BMI BMI is not in the acceptable range; BMI management plan is completed Follow up in 6 months.  Patient Counseling: [x]   Nutrition: Stressed importance of moderation in sodium/caffeine intake, saturated fat and cholesterol, caloric balance, sufficient intake of fresh fruits, vegetables, and fiber.  [x]   Stressed the importance of regular exercise.   []   Substance Abuse: Discussed cessation/primary prevention of tobacco, alcohol, or other drug use; driving or other dangerous activities under the influence; availability of treatment for abuse.   [x]   Injury prevention: Discussed safety belts, safety helmets, smoke detector, smoking near bedding or upholstery.   []   Sexuality: Discussed sexually transmitted diseases, partner selection, use of condoms, avoidance of unintended pregnancy  and contraceptive alternatives.   [x]   Dental health: Discussed importance of regular tooth brushing, flossing, and dental visits.  [x]   Health maintenance and immunizations reviewed. Please refer to Health maintenance section.     [name], PA-C [place]

## 2018-03-09 NOTE — Patient Instructions (Signed)
It was great to see you!  You will be contacted about your rheumatology referral.   Health Maintenance, Male A healthy lifestyle and preventive care is important for your health and wellness. Ask your health care provider about what schedule of regular examinations is right for you. What should I know about weight and diet? Eat a Healthy Diet  Eat plenty of vegetables, fruits, whole grains, low-fat dairy products, and lean protein.  Do not eat a lot of foods high in solid fats, added sugars, or salt.  Maintain a Healthy Weight Regular exercise can help you achieve or maintain a healthy weight. You should:  Do at least 150 minutes of exercise each week. The exercise should increase your heart rate and make you sweat (moderate-intensity exercise).  Do strength-training exercises at least twice a week.  Watch Your Levels of Cholesterol and Blood Lipids  Have your blood tested for lipids and cholesterol every 5 years starting at 36 years of age. If you are at high risk for heart disease, you should start having your blood tested when you are 36 years old. You may need to have your cholesterol levels checked more often if: ? Your lipid or cholesterol levels are high. ? You are older than 36 years of age. ? You are at high risk for heart disease.  What should I know about cancer screening? Many types of cancers can be detected early and may often be prevented. Lung Cancer  You should be screened every year for lung cancer if: ? You are a current smoker who has smoked for at least 30 years. ? You are a former smoker who has quit within the past 15 years.  Talk to your health care provider about your screening options, when you should start screening, and how often you should be screened.  Colorectal Cancer  Routine colorectal cancer screening usually begins at 36 years of age and should be repeated every 5-10 years until you are 36 years old. You may need to be screened more often if  early forms of precancerous polyps or small growths are found. Your health care provider may recommend screening at an earlier age if you have risk factors for colon cancer.  Your health care provider may recommend using home test kits to check for hidden blood in the stool.  A small camera at the end of a tube can be used to examine your colon (sigmoidoscopy or colonoscopy). This checks for the earliest forms of colorectal cancer.  Prostate and Testicular Cancer  Depending on your age and overall health, your health care provider may do certain tests to screen for prostate and testicular cancer.  Talk to your health care provider about any symptoms or concerns you have about testicular or prostate cancer.  Skin Cancer  Check your skin from head to toe regularly.  Tell your health care provider about any new moles or changes in moles, especially if: ? There is a change in a mole's size, shape, or color. ? You have a mole that is larger than a pencil eraser.  Always use sunscreen. Apply sunscreen liberally and repeat throughout the day.  Protect yourself by wearing long sleeves, pants, a wide-brimmed hat, and sunglasses when outside.  What should I know about heart disease, diabetes, and high blood pressure?  If you are 54-61 years of age, have your blood pressure checked every 3-5 years. If you are 96 years of age or older, have your blood pressure checked every year. You should  have your blood pressure measured twice-once when you are at a hospital or clinic, and once when you are not at a hospital or clinic. Record the average of the two measurements. To check your blood pressure when you are not at a hospital or clinic, you can use: ? An automated blood pressure machine at a pharmacy. ? A home blood pressure monitor.  Talk to your health care provider about your target blood pressure.  If you are between 88-24 years old, ask your health care provider if you should take aspirin to  prevent heart disease.  Have regular diabetes screenings by checking your fasting blood sugar level. ? If you are at a normal weight and have a low risk for diabetes, have this test once every three years after the age of 42. ? If you are overweight and have a high risk for diabetes, consider being tested at a younger age or more often.  A one-time screening for abdominal aortic aneurysm (AAA) by ultrasound is recommended for men aged 26-75 years who are current or former smokers. What should I know about preventing infection? Hepatitis B If you have a higher risk for hepatitis B, you should be screened for this virus. Talk with your health care provider to find out if you are at risk for hepatitis B infection. Hepatitis C Blood testing is recommended for:  Everyone born from [birth year] through 1964.  Anyone with known risk factors for hepatitis C.  Sexually Transmitted Diseases (STDs)  You should be screened each year for STDs including gonorrhea and chlamydia if: ? You are sexually active and are younger than 37 years of age. ? You are older than 36 years of age and your health care provider tells you that you are at risk for this type of infection. ? Your sexual activity has changed since you were last screened and you are at an increased risk for chlamydia or gonorrhea. Ask your health care provider if you are at risk.  Talk with your health care provider about whether you are at high risk of being infected with HIV. Your health care provider may recommend a prescription medicine to help prevent HIV infection.  What else can I do?  Schedule regular health, dental, and eye exams.  Stay current with your vaccines (immunizations).  Do not use any tobacco products, such as cigarettes, chewing tobacco, and e-cigarettes. If you need help quitting, ask your health care provider.  Limit alcohol intake to no more than 2 drinks per day. One drink equals 12 ounces of beer, 5 ounces of wine, or  1 ounces of hard liquor.  Do not use street drugs.  Do not share needles.  Ask your health care provider for help if you need support or information about quitting drugs.  Tell your health care provider if you often feel depressed.  Tell your health care provider if you have ever been abused or do not feel safe at home. This information is not intended to replace advice given to you by your health care provider. Make sure you discuss any questions you have with your health care provider. Document Released: 05/09/2008 Document Revised: 07/10/2016 Document Reviewed: 08/15/2015 Elsevier Interactive Patient Education  [employer].

## 2018-03-09 NOTE — Assessment & Plan Note (Signed)
Re-check labs today, f/u based on results (if normal see in 6 months, if abnormal, see in 1-2 months.)  Currently without any signs or symptoms of uncontrolled thyroid disease.

## 2018-03-09 NOTE — Assessment & Plan Note (Addendum)
Continue diet and exercise as able. He continues to have knee pain, which he is going to pursue a second opinion about.

## 2018-03-11 ENCOUNTER — Ambulatory Visit (INDEPENDENT_AMBULATORY_CARE_PROVIDER_SITE_OTHER): Payer: Self-pay | Admitting: Sports Medicine

## 2018-03-11 ENCOUNTER — Encounter: Payer: Self-pay | Admitting: Sports Medicine

## 2018-03-11 DIAGNOSIS — M2550 Pain in unspecified joint: Secondary | ICD-10-CM

## 2018-03-11 DIAGNOSIS — R269 Unspecified abnormalities of gait and mobility: Secondary | ICD-10-CM

## 2018-03-11 NOTE — Progress Notes (Signed)
  Zachary Little. Zachary Little, [place] [facility] [place] - 37 y.o. male MRN [id number]  Date of birth: [birth year]  Visit Date: 03/11/2018  PCP: [name], PA   Referred by: [name], [place]  Scribe for today's visit: Wendy Poet, LAT, ATC     SUBJECTIVE:  Zachary Little is here for No chief complaint on file. Marland Kitchen   01/02/18: Compared to the last office visit, his previously described symptoms show no change  Current symptoms are mild & are nonradiating He had steroid injection for his knee at last visit and hasn't noticed much of a change with his LT knee pain. He has d/c Pennsaid because he wasn't getting relief while using it. He has Meloxicam which he takes only as needed.   Compared to the last office visit, his previously described symptoms show no change. He is still having pain when sitting with his knee bent. He doesn't have pain with walking or running. He denies swelling around the knee. Pain is mostly medial. He denies clicking and popping.  Current symptoms are mild-moderate (4/10) & are radiating to L thigh and L foot when sitting with his knee to the side.  He has been taking Meloxicam prn, rarely, with minimal relief. He had steroid injection in the past and got little relief.   02/06/18: Compared to the last office visit, his previously described symptoms show no change. He was seen by a neurologist and they released him and didn't feel like they were able to help him, was told to f/u with Dr. Paulla Fore.  Current symptoms are moderate & are nonradiating. Pain is worse when lying down.  He has gotten some relief from the pain when his wife massages his back.   03/11/18: Pt is here today to have another pair of orthotics fabricated due to feeling like his last pair was too small.  ROS Denies night time disturbances. Denies fevers, chills, or night sweats. Denies unexplained weight loss. Reports  personal history of cancer - AML Denies changes in bowel or bladder habits. Denies recent unreported falls. Denies new or worsening dyspnea or wheezing. Denies headaches or dizziness.  Denies numbness, tingling or weakness  In the extremities.  Denies dizziness or presyncopal episodes Denies lower extremity edema    PROCEDURE: CUSTOM ORTHOTIC FABRICATION Patient's underlying musculoskeletal conditions are directly related to poor biomechanics and will benefit from a functional custom orthotic.  There are no significant foot deformities that complicate the use of a custom orthotic.  The patient was fitted for a standard, cushioned, semi-rigid orthotic. The orthotic was heated & placed on the orthotic stand. The patient was positioned in subtalar neutral position and 10 of ankle dorsiflexion and weight bearing stance on the heated orthotic blank. After completion of the molding a base was applied to the orthotic blank. The orthotic was ground to a stable position for weightbearing. The patient ambulated in these and reported they were comfortable without pressure spots.              BLANK:  Size 9 - Standard Cushioned                 BASE:  Blue EVA      POSTINGS:  N/a   Ultimately this was a Sales executive of orthotics given the prior fabrication was too small.  No charge office visit and no charge for orthotics.

## 2018-03-17 ENCOUNTER — Ambulatory Visit (INDEPENDENT_AMBULATORY_CARE_PROVIDER_SITE_OTHER): Payer: No Typology Code available for payment source | Admitting: Physician Assistant

## 2018-03-17 ENCOUNTER — Encounter: Payer: Self-pay | Admitting: Physician Assistant

## 2018-03-17 VITALS — BP 122/80 | HR 65 | Temp 97.8°F | Wt 210.8 lb

## 2018-03-17 DIAGNOSIS — E669 Obesity, unspecified: Secondary | ICD-10-CM

## 2018-03-17 MED ORDER — LEVOTHYROXINE SODIUM 150 MCG PO TABS: 150.0000 ug | ORAL_TABLET | Freq: Every day | ORAL | 1 refills | Status: DC

## 2018-03-17 MED FILL — LEVOTHYROXINE 150 MCG TAB: 150 | 90 days supply | Qty: 90 | Fill #0

## 2018-03-17 NOTE — Patient Instructions (Addendum)
It was great to see you!  I have re-sent in your thyroid medication.  The rheumatologist is currently reviewing your chart for further evaluation prior to scheduling your visit.  You will be contacted about your referral to see a dietitian.

## 2018-03-17 NOTE — Progress Notes (Signed)
Zachary Little is a 37 y.o. male here for a new problem.  I acted as a Education administrator for [employer], PA-C Anselmo Pickler, LPN  History of Present Illness:   Chief Complaint  Patient presents with  . Weight Loss    Pt here today to discuss weight loss. About two years ago he weighed 235 lb. Through exercise mostly, he was able to get down to his current weight of around 210 lb. He exercises 4 times a week and jogs/walks 2.5-3.5 miles at a time. He has ran 5ks in the past but would like to run a competitive 5k. He would like to weigh around 180 lb. He does not vary his exercise, he does not do any strength training. He has tried to keep track of his foods in the past however he states that he has difficulty with figuring out how to track prepared foods and foods without labels.   He does have hypothyroidism and his most recent labs have been stable.   Wt Readings from Last 15 Encounters:  03/17/18 210 lb 12.8 oz (95.6 kg)  03/09/18 210 lb 9.6 oz (95.5 kg)  02/06/18 208 lb 12.8 oz (94.7 kg)  01/30/18 211 lb (95.7 kg)  01/30/18 205 lb (93 kg)  01/21/18 209 lb (94.8 kg)  01/02/18 210 lb 6.4 oz (95.4 kg)  12/10/17 212 lb 9.6 oz (96.4 kg)  11/10/17 210 lb 3.2 oz (95.3 kg)  10/31/17 208 lb 3.2 oz (94.4 kg)  10/14/17 213 lb (96.6 kg)  10/10/17 213 lb 3.2 oz (96.7 kg)  10/01/17 211 lb 8 oz (95.9 kg)  08/26/17 209 lb (94.8 kg)  08/22/17 208 lb (94.3 kg)      Past Medical History:  Diagnosis Date  . Anxiety   . Blood transfusion without reported diagnosis 2010   During Chemo Treatments  . 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 Thyroid disease      Social History   Socioeconomic History  . Marital status: Single    Spouse name: Not on file  . Number of children: Not on file  . Years of education: Not on file  . Highest education level: Not on file  Occupational History  . Not on file  Social Needs  . Financial resource strain: Not on file  . Food  insecurity:    Worry: Not on file    Inability: Not on file  . Transportation needs:    Medical: Not on file    Non-medical: Not on file  Tobacco Use  . Smoking status: Never Smoker  . Smokeless tobacco: Never Used  Substance and Sexual Activity  . Alcohol use: No  . Drug use: No  . Sexual activity: Yes    Partners: Female  Lifestyle  . Physical activity:    Days per week: Not on file    Minutes per session: Not on file  . Stress: Not on file  Relationships  . Social connections:    Talks on phone: Not on file    Gets together: Not on file    Attends religious service: Not on file    Active member of club or organization: Not on file    Attends meetings of clubs or organizations: Not on file    Relationship status: Not on file  . Intimate partner violence:    Fear of current or ex partner: Not on file    Emotionally abused: Not on file    Physically abused: Not on file  Forced sexual activity: Not on file  Other Topics Concern  . 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 No pertinent surgical history.  Family History  Problem Relation Age of Onset  . Diabetes Maternal Grandfather   . Diabetes Paternal Grandfather   . Prostate cancer Neg Hx   . Colon cancer Neg Hx     No Known Allergies  Current Medications:   Current Outpatient Medications:  .  diazepam (VALIUM) 5 MG tablet, Take 1 tablet (5 mg total) by mouth every 12 (twelve) hours as needed for muscle spasms., Disp: 30 tablet, Rfl: 0 .  levothyroxine (SYNTHROID, LEVOTHROID) 150 MCG tablet, Take 1 tablet (150 mcg total) by mouth daily., Disp: 90 tablet, Rfl: 1   Review of Systems:   ROS  Negative unless otherwise specified per HPI.  Vitals:   Vitals:   03/17/18 1420  BP: 122/80  Pulse: 65  Temp: 97.8 F (36.6 C)  Weight: 210 lb 12.8 oz (95.6 kg)     Body mass index is 32.05  kg/m.  Physical Exam:   Physical Exam  Constitutional: He is oriented to person, place, and time. He appears well-developed and well-nourished.  HENT:  Head: Normocephalic.  Eyes: Pupils are equal, round, and reactive to light.  Neck: Normal range of motion.  Pulmonary/Chest: Effort normal.  Abdominal: Soft.  Musculoskeletal: Normal range of motion.  Neurological: He is alert and oriented to person, place, and time.  Skin: Skin is warm and dry.  Nursing note and vitals reviewed.    Assessment and Plan:    [name] was seen today for weight loss.  Diagnoses and all orders for this visit:  Obesity, unspecified classification, unspecified obesity type, unspecified whether serious comorbidity present -     Referral to Nutrition and Diabetes Services  Other orders -     levothyroxine (SYNTHROID, LEVOTHROID) 150 MCG tablet; Take 1 tablet (150 mcg total) by mouth daily.   Patient would like to see an outpatient dietitian. I did offer to review patient's diet today in our office however he declined. We also briefly discussed the utility of weight loss medications but discussed deferring them at this time, as he would like to work on diet and exercise.  . Reviewed expectations re: course of current medical issues. . Discussed self-management of symptoms. . Outlined signs and symptoms indicating need for more acute intervention. . Patient verbalized understanding and all questions were answered. . See orders for this visit as documented in the electronic medical record. . Patient received an After-Visit Summary.  CMA or LPN served as scribe during this visit. History, Physical, and Plan performed by medical provider. Documentation and orders reviewed and attested to.  [name], PA-C

## 2018-04-01 ENCOUNTER — Encounter: Payer: Self-pay | Admitting: Skilled Nursing Facility1

## 2018-04-01 ENCOUNTER — Encounter
Payer: No Typology Code available for payment source | Attending: Physician Assistant | Admitting: Skilled Nursing Facility1

## 2018-04-01 DIAGNOSIS — Z6831 Body mass index (BMI) 31.0-31.9, adult: Secondary | ICD-10-CM | POA: Diagnosis not present

## 2018-04-01 DIAGNOSIS — E669 Obesity, unspecified: Secondary | ICD-10-CM | POA: Diagnosis not present

## 2018-04-01 DIAGNOSIS — Z713 Dietary counseling and surveillance: Secondary | ICD-10-CM | POA: Insufficient documentation

## 2018-04-01 NOTE — Progress Notes (Signed)
  Assessment:  Primary concerns today: obesity.  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  Pt states he wants to be 170-180 pounds. Pt states he has Been at his current weight for about 2 years. Pt states he Was at 230 pounds about 3 years ago but them made lifestyle changes but has stayed the same weight. Pt states he is a picky eater. Pt states he sometimes eats cereal when waiting for his wife to get home and then they have dinner. Pt states he sleeps 8 hours of mostly restful sleep. Pt states his wife is also trying to lose weight so he decided to talk with a dietitian because she would not. Pt states he really came here to find a  Support group for weight loss: dietitian stated she was unaware of such a support group other than weight watchers which he did not like or finding online support that he could possible find a thread to in person groups.    MEDICATIONS: See List   DIETARY INTAKE:  Usual eating pattern includes 3 meals and 1 snacks per day.  Everyday foods include frozen.  Avoided foods include all nonstarchy vegetables.    24-hr recall: eating out 2 days a week  B ( AM): cereal and [place] sausage (400 calories) Snk ( AM):  L ( 12-1PM): bagel bites or frozen meals or potato wedges from grocery store Snk ( PM): pretzel sticks or croutons  D ( PM): cereal Snk ( PM): freezer or hot dogs or spagetti Beverages: water  Usual physical activity: 11 minute miles 4-5 days a week  Estimated energy needs: 1600 calories 180 g carbohydrates 120 g protein 44 g fat  Progress Towards Goal(s):  In progress.   Nutritional Diagnosis:  NB-1.1 Food and nutrition-related knowledge deficit As related to no prior nutrition education from a nutrition professional.  As evidenced by no consumption of nonstarchy vegetables .    Intervention:  Nutrition counseling for weight loss. Goals: -Eat 1.5-2 cups of nonstarchy vegetables 2 times a day 7 days a week -Drink 80 ounces of water -Do not over  consume calories at restaurants -consistently stay aware of food volume for dinner -Add in different cardio workouts and 2 days of resistance workouts   Teaching Method Utilized:  Visual Auditory Hands on  Handouts given during visit include:  Meal ideas  Snack ideas  Barriers to learning/adherence to lifestyle change: none stated  Demonstrated degree of understanding via:  Teach Back   Monitoring/Evaluation:  Dietary intake, exercise, and body weight prn.

## 2018-04-02 ENCOUNTER — Encounter: Payer: Self-pay | Admitting: Sports Medicine

## 2018-04-03 ENCOUNTER — Encounter: Payer: Self-pay | Admitting: Sports Medicine

## 2018-04-17 NOTE — Progress Notes (Deleted)
Office Visit Note  Patient: Zachary Little             Date of Birth: 11/05/1981           MRN: [id number]             PCP: [name], PA Referring: [name], PA Visit Date: 04/30/2018 Occupation: @GUAROCC @    Subjective:  No chief complaint on file.   History of Present Illness: Zachary Little is a 37 y.o. male ***   Activities of Daily Living:  Patient reports morning stiffness for *** {minute/hour:19697}.   Patient {ACTIONS;DENIES/REPORTS:21021675::"Denies"} nocturnal pain.  Difficulty dressing/grooming: {ACTIONS;DENIES/REPORTS:21021675::"Denies"} Difficulty climbing stairs: {ACTIONS;DENIES/REPORTS:21021675::"Denies"} Difficulty getting out of chair: {ACTIONS;DENIES/REPORTS:21021675::"Denies"} Difficulty using hands for taps, buttons, cutlery, and/or writing: {ACTIONS;DENIES/REPORTS:21021675::"Denies"}   No Rheumatology ROS completed.   PMFS History:  Patient Active Problem List   Diagnosis Date Noted  . Depression, major, single episode, complete remission ([place]) 03/09/2018  . Erectile dysfunction 03/09/2018  . Obesity 03/09/2018  . Abnormal gait 01/02/2018  . 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 Osteoarthritis of spine 11/26/2017  . Knee pain 10/31/2017  . Thoracic spondylosis 10/28/2017  . Thoracic radiculopathy 05/23/2017  . Meralgia paresthetica of right side 05/23/2017  . AML (acute myeloid leukemia) in remission ([place]) 05/16/2015  . Hypothyroidism (acquired) 05/16/2015  . Vitamin B12 deficiency 10/14/2011    Past Medical History:  Diagnosis Date  . Anxiety   . Blood transfusion without reported diagnosis 2010   During Chemo Treatments  . 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 Thyroid disease     Family History  Problem Relation Age of Onset  . Diabetes Maternal Grandfather   . Diabetes Paternal Grandfather   . Prostate cancer Neg Hx   . Colon cancer Neg Hx    No past surgical history on file. 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   Physical Exam   Musculoskeletal Exam: ***  CDAI Exam: No CDAI exam completed.    Investigation: No additional findings.  Component     Latest Ref Rng & Units 05/22/2017  Sed Rate     0 - 15 mm/hr 1  CRP     0.5 - 20.0 mg/dL 0.4 (L)   CBC Latest Ref Rng & Units 03/09/2018 08/26/2017 05/22/2017  WBC 4.0 - 10.5 K/uL 6.5 4.6 5.7  Hemoglobin 13.0 - 17.0 g/dL 16.9 16.2 16.8  Hematocrit 39.0 - 52.0 % 48.7 47.2 48.2  Platelets 150.0 - 400.0 K/uL 195.0 190.0 207.0   CMP Latest Ref Rng & Units 03/09/2018 05/22/2017 12/02/2016  Glucose 70 - 99 mg/dL 90 95 -  BUN 6 - 23 mg/dL 13 15 15   Creatinine 0.40 - 1.50 mg/dL 0.80 0.88 0.8  Sodium 135 - 145 mEq/L 140 140 145  Potassium 3.5 - 5.1 mEq/L 4.3 3.7 4.4  Chloride 96 - 112 mEq/L 104 102 -  CO2 19 - 32 mEq/L 27 29 -  Calcium 8.4 - 10.5 mg/dL 9.6 10.0 -  Total Protein 6.0 - 8.3 g/dL 6.8 7.2 -  Total Bilirubin 0.2 - 1.2 mg/dL 0.9 0.7 -  Alkaline Phos 39 - 117 U/L 57 59 63  AST 0 - 37 U/L 11 16 23   ALT 0 - 53 U/L 15 16 26  Imaging: No results found.  Speciality Comments: No specialty comments available.    Procedures:  No procedures performed Allergies: Patient has no known allergies.   Assessment / Plan:     Visit Diagnoses: Polyarthralgia  Positive ANA (antinuclear antibody)  Meralgia paresthetica of right side  Thoracic radiculopathy  Hypothyroidism (acquired)  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)  Congenital cavus deformity of foot  Vitamin B12 deficiency  Anxiety and depression    Orders: No orders of the defined types were placed in this encounter.  No orders of the defined types were placed in this encounter.   Face-to-face time spent with patient was *** minutes. 50% of time was spent in  counseling and coordination of care.  Follow-Up Instructions: No follow-ups on file.   Ofilia Neas, PA-C  Note - This record has been created using Dragon software.  Chart creation errors have been sought, but may not always  have been located. Such creation errors do not reflect on  the standard of medical care.

## 2018-04-30 ENCOUNTER — Ambulatory Visit: Payer: Self-pay | Admitting: Rheumatology

## 2018-06-08 ENCOUNTER — Encounter: Payer: Self-pay | Admitting: Physician Assistant

## 2018-06-08 ENCOUNTER — Ambulatory Visit (INDEPENDENT_AMBULATORY_CARE_PROVIDER_SITE_OTHER): Payer: No Typology Code available for payment source | Admitting: Physician Assistant

## 2018-06-08 VITALS — BP 110/80 | HR 74 | Temp 97.8°F | Ht 68.0 in | Wt 199.5 lb

## 2018-06-08 DIAGNOSIS — R229 Localized swelling, mass and lump, unspecified: Secondary | ICD-10-CM

## 2018-06-08 DIAGNOSIS — R1032 Left lower quadrant pain: Secondary | ICD-10-CM | POA: Diagnosis not present

## 2018-06-08 NOTE — Patient Instructions (Signed)
It was great to see you!  If symptoms persist, please follow-up with Dr. Paulla Fore.  Let us know if/when you would like a testicular ultrasound.

## 2018-06-08 NOTE — Progress Notes (Signed)
Zachary Little is a 37 y.o. male here for a new problem.  I acted as a Education administrator for [employer], PA-C Anselmo Pickler, LPN  History of Present Illness:   Chief Complaint  Patient presents with  . Left groin pain    Groin Pain  The patient's primary symptoms include testicular pain (Left). The patient's pertinent negatives include no genital itching, genital lesions, penile discharge, penile pain or scrotal swelling. Primary symptoms comment: Left groin pain. This is a new problem. Episode onset: Started 2 weeks ago. The problem occurs intermittently. The problem has been unchanged. The pain is mild. Pertinent negatives include no abdominal pain, chills, discolored urine, dysuria, fever, flank pain, frequency, hematuria, nausea, painful intercourse, urgency or vomiting. The testicular pain affects the left testicle. Testicle swelling: None. The color of the testicles is normal. The symptoms are aggravated by activity (sitting and lying down at night). He has tried nothing for the symptoms. He is sexually active. He never uses condoms. No, his partner does not have an STD. His past medical history is significant for erectile dysfunction. There is no history of chlamydia, gonorrhea, an inguinal hernia or kidney stones.   Denies prior history of hernia.  He does have a cyst on his left scrotum, reports that this has been there for at least 10 years, and has been seen by a urologist, but he has not been given a diagnosis.  He states that this is has increased in size over the past 10 years.  The cyst does not have any tenderness.    Past Medical History:  Diagnosis Date  . Anxiety   . Blood transfusion without reported diagnosis 2010   During Chemo Treatments  . 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 Thyroid disease      Social History   Socioeconomic History  . Marital status: Single    Spouse name: Not on file  . Number of children: Not on file  . Years of education:  Not on file  . Highest education level: Not on file  Occupational History  . Not on file  Social Needs  . Financial resource strain: Not on file  . Food insecurity:    Worry: Not on file    Inability: Not on file  . Transportation needs:    Medical: Not on file    Non-medical: Not on file  Tobacco Use  . Smoking status: Never Smoker  . Smokeless tobacco: Never Used  Substance and Sexual Activity  . Alcohol use: No  . Drug use: No  . Sexual activity: Yes    Partners: Female  Lifestyle  . Physical activity:    Days per week: Not on file    Minutes per session: Not on file  . Stress: Not on file  Relationships  . Social connections:    Talks on phone: Not on file    Gets together: Not on file    Attends religious service: Not on file    Active member of club or organization: Not on file    Attends meetings of clubs or organizations: Not on file    Relationship status: Not on file  . Intimate partner violence:    Fear of current or ex partner: Not on file    Emotionally abused: Not on file    Physically abused: Not on file    Forced sexual activity: Not on file  Other Topics Concern  . 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 No pertinent surgical history.  Family History  Problem Relation Age of Onset  . Diabetes Maternal Grandfather   . Diabetes Paternal Grandfather   . Prostate cancer Neg Hx   . Colon cancer Neg Hx     No Known Allergies  Current Medications:   Current Outpatient Medications:  .  diazepam (VALIUM) 5 MG tablet, Take 1 tablet (5 mg total) by mouth every 12 (twelve) hours as needed for muscle spasms., Disp: 30 tablet, Rfl: 0 .  levothyroxine (SYNTHROID, LEVOTHROID) 150 MCG tablet, Take 1 tablet (150 mcg total) by mouth daily., Disp: 90 tablet, Rfl: 1   Review of Systems:   Review of Systems  Constitutional: Negative for chills and fever.   Gastrointestinal: Negative for abdominal pain, nausea and vomiting.  Genitourinary: Positive for testicular pain (Left). Negative for discharge, dysuria, flank pain, frequency, penile pain, scrotal swelling and urgency.    Vitals:   Vitals:   06/08/18 1000  BP: 110/80  Pulse: 74  Temp: 97.8 F (36.6 C)  TempSrc: Oral  SpO2: 97%  Weight: 199 lb 8 oz (90.5 kg)  Height: 5\' 8"  (1.727 m)     Body mass index is 30.33 kg/m.  Physical Exam:   Physical Exam  Constitutional: He appears well-developed. He is cooperative.  Non-toxic appearance. He does not have a sickly appearance. He does not appear ill. No distress.  Cardiovascular: Normal rate, regular rhythm, S1 normal, S2 normal, normal heart sounds and normal pulses.  No LE edema  Pulmonary/Chest: Effort normal and breath sounds normal.  Abdominal: Normal appearance and bowel sounds are normal. There is no tenderness.  Genitourinary: Penis normal.     Neurological: He is alert. GCS eye subscore is 4. GCS verbal subscore is 5. GCS motor subscore is 6.  Skin: Skin is warm, dry and intact.  Psychiatric: He has a normal mood and affect. His speech is normal and behavior is normal.  Nursing note and vitals reviewed.   Assessment and Plan:    Primus was seen today for left groin pain.  Diagnoses and all orders for this visit:  Left groin pain No red flags on exam.  Patient also evaluated by Dr. Briscoe Deutscher.  Suspect groin strain versus very mild hernia.  No indication for imaging at this time.  Did discuss with patient that if symptoms do not improve or if they worsen, he may need to see Dr. Paulla Fore or may need a CT scan to further image area.  Discussed ice and anti-inflammatories.  I did provide him with a list of exercises regarding groin strain.  He is agreeable to plan.  Skin nodule Scrotum nodule has been present for at least 10 years.  It has increased in size slowly, but not ever evaluated in terms of imaging.  Discussed  imaging does not need to be performed today, but should be done at some point in the future.  Patient would like to hold off on scheduling this for for now.   . Reviewed expectations re: course of current medical issues. . Discussed self-management of symptoms. . Outlined signs and symptoms indicating need for more acute intervention. . Patient verbalized understanding and all questions were answered. . See orders for this visit as documented in the electronic medical record. . Patient received an After-Visit Summary.  CMA or LPN served as Education administrator during this  visit. History, Physical, and Plan performed by medical provider. Documentation and orders reviewed and attested to.   [name], PA-C

## 2018-06-10 ENCOUNTER — Ambulatory Visit: Payer: Self-pay | Admitting: Rheumatology

## 2018-06-19 MED FILL — LEVOTHYROXINE 150 MCG TAB: 150 | 90 days supply | Qty: 90 | Fill #1

## 2018-07-02 ENCOUNTER — Encounter: Payer: Self-pay | Admitting: Physician Assistant

## 2018-07-02 ENCOUNTER — Ambulatory Visit (INDEPENDENT_AMBULATORY_CARE_PROVIDER_SITE_OTHER): Payer: No Typology Code available for payment source | Admitting: Physician Assistant

## 2018-07-02 VITALS — BP 110/76 | HR 86 | Ht 68.0 in | Wt 204.2 lb

## 2018-07-02 DIAGNOSIS — M542 Cervicalgia: Secondary | ICD-10-CM

## 2018-07-02 DIAGNOSIS — M25511 Pain in right shoulder: Secondary | ICD-10-CM

## 2018-07-02 MED ORDER — METHYLPREDNISOLONE 4 MG PO TBPK: ORAL_TABLET | ORAL | 0 refills | Status: DC

## 2018-07-02 MED FILL — METHYLPREDNISOLONE 4 MG TAB: 4 | 6 days supply | Qty: 21 | Fill #0

## 2018-07-02 NOTE — Progress Notes (Signed)
Zachary Little is a 37 y.o. male here for a new problem.  History of Present Illness:   Chief Complaint  Patient presents with  . neck, back, R shoulder pain    pain x 2 weeks, radiating to the R shoulder and arm. He denies weakness, n/t in R arm. Pain is described as aching and throbbing. Worse first thing in the morning, improves throughout the day. He has tried Tylenol, IBU, and heat with minimal relief. Upper back pain is constant, arm pain comes and goes.     HPI   Neck, upper back and R shoulder pain Patient reports that he slept on his neck the wrong way two weeks ago and since then he has had pain x 2 weeks, radiating to the R shoulder and arm. He denies weakness, numbness/tingling in R arm. Pain is described as aching and throbbing. Worse first thing in the morning, improves throughout the day. He has tried tylenol, ibuprogren, and heat with minimal relief. Upper back pain is constant, arm pain comes and goes  Past Medical History:  Diagnosis Date  . Anxiety   . Blood transfusion without reported diagnosis 2010   During Chemo Treatments  . 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 Thyroid disease      Social History   Socioeconomic History  . Marital status: Single    Spouse name: Not on file  . Number of children: Not on file  . Years of education: Not on file  . Highest education level: Not on file  Occupational History  . Not on file  Social Needs  . Financial resource strain: Not on file  . Food insecurity:    Worry: Not on file    Inability: Not on file  . Transportation needs:    Medical: Not on file    Non-medical: Not on file  Tobacco Use  . Smoking status: Never Smoker  . Smokeless tobacco: Never Used  Substance and Sexual Activity  . Alcohol use: No  . Drug use: No  . Sexual activity: Yes    Partners: Female  Lifestyle  . Physical activity:    Days per week: Not on file    Minutes per session: Not on file  . Stress: Not on file   Relationships  . Social connections:    Talks on phone: Not on file    Gets together: Not on file    Attends religious service: Not on file    Active member of club or organization: Not on file    Attends meetings of clubs or organizations: Not on file    Relationship status: Not on file  . Intimate partner violence:    Fear of current or ex partner: Not on file    Emotionally abused: Not on file    Physically abused: Not on file    Forced sexual activity: Not on file  Other Topics Concern  . Not on file  Social History Narrative   Lives in [place]   Stay at home    Married in September, no kids   No pets    No past surgical history on file.  Family History  Problem Relation Age of Onset  . Diabetes Maternal Grandfather   . Diabetes Paternal Grandfather   . Prostate cancer Neg Hx   . Colon cancer Neg Hx     No Known Allergies  Current Medications:   Current Outpatient Medications:  .  levothyroxine (SYNTHROID, LEVOTHROID) 150 MCG tablet, Take  1 tablet (150 mcg total) by mouth daily., Disp: 90 tablet, Rfl: 1 .  methylPREDNISolone (MEDROL DOSEPAK) 4 MG TBPK tablet, 6-5-4-3-2-1 then off, Disp: 21 tablet, Rfl: 0   Review of Systems:   ROS  Negative unless otherwise specified per HPI.  Vitals:   Vitals:   07/02/18 1118  BP: 110/76  Pulse: 86  SpO2: 98%  Weight: 204 lb 3.2 oz (92.6 kg)  Height: 5\' 8"  (1.727 m)     Body mass index is 31.05 kg/m.  Physical Exam:   Physical Exam  Constitutional: He appears well-developed. He is cooperative.  Non-toxic appearance. He does not have a sickly appearance. He does not appear ill. No distress.  Cardiovascular: Normal rate, regular rhythm, S1 normal, S2 normal, normal heart sounds and normal pulses.  Pulses:      Radial pulses are 2+ on the right side, and 2+ on the left side.  No LE edema  Pulmonary/Chest: Effort normal and breath sounds normal.  Musculoskeletal:  No decreased ROM 2/2 pain with  flexion/extension, lateral side bends, or rotation. Reproducible tenderness with deep palpation to bilateral paraspinal cervical muscles. No bony tenderness. No evidence of erythema, rash or ecchymosis.   R shoulder without decreased ROM or endorsement of pain with resisted ROM.   Neurological: He is alert. GCS eye subscore is 4. GCS verbal subscore is 5. GCS motor subscore is 6.  Skin: Skin is warm, dry and intact.  Psychiatric: He has a normal mood and affect. His speech is normal and behavior is normal.  Nursing note and vitals reviewed.   Assessment and Plan:    Zachary Little was seen today for neck, back, r shoulder pain.  Diagnoses and all orders for this visit:  Neck pain and Acute pain of right shoulder Suspect strain. Symptoms not severe at this time. Discussed imaging vs treatment and wait and see. Patient opted for medrol dose pack. We also discussed possibility of dry needling and he would like to explore this -- I have put consult in with Lyndee Hensen, PT in our office. Follow-up with [place] or Paulla Fore if other symptoms change or would like pursue imaging. -     Ambulatory referral to Physical Therapy  Other orders -     methylPREDNISolone (MEDROL DOSEPAK) 4 MG TBPK tablet; 6-5-4-3-2-1 then off   . Reviewed expectations re: course of current medical issues. . Discussed self-management of symptoms. . Outlined signs and symptoms indicating need for more acute intervention. . Patient verbalized understanding and all questions were answered. . See orders for this visit as documented in the electronic medical record. . Patient received an After-Visit Summary.  CMA or LPN served as scribe during this visit. History, Physical, and Plan performed by medical provider. Documentation and orders reviewed and attested to.  [name], PA-C

## 2018-07-02 NOTE — Patient Instructions (Signed)
It was great to see you!  Start medrol dose pack, take as directed. Please follow-up with me or Paulla Fore if needed.  I have put the referral in for you to set-up an appointment with Lyndee Hensen our PT here for dry needling, schedule at your convenience.  Take care,  [name] PA-C

## 2018-07-03 ENCOUNTER — Encounter: Payer: Self-pay | Admitting: Physician Assistant

## 2018-07-09 ENCOUNTER — Ambulatory Visit (INDEPENDENT_AMBULATORY_CARE_PROVIDER_SITE_OTHER): Payer: No Typology Code available for payment source | Admitting: Physical Therapy

## 2018-07-09 ENCOUNTER — Ambulatory Visit: Payer: Self-pay | Admitting: Physical Therapy

## 2018-07-09 DIAGNOSIS — M542 Cervicalgia: Secondary | ICD-10-CM

## 2018-07-09 NOTE — Therapy (Signed)
[facility] [place], [place], [address] Phone: [phone]   Fax:  [phone]  Physical Therapy Note/ No Visit  Patient Details  Name: Zachary Little MRN: [id number] Date of Birth: [birth year] No data recorded  Encounter Date: 07/09/2018    Past Medical History:  Diagnosis Date  . Anxiety   . Blood transfusion without reported diagnosis 2010   During Chemo Treatments  . 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 Thyroid disease     No past surgical history on file.  There were no vitals filed for this visit.   Subjective Assessment - 07/09/18 1551    Subjective  Pt arrived to appt today, states that he is no longer having any neck or shoulder issues, and that the medication has helped that. He does state pain in L thoracic region. He has had several PT visits at another clinic, with no resolution of pain, and has also seen MD several times for this, as well as negative imaging. Referral for PT was placed for shoulder/neck pain, which pt is not in need of. With out discussion, it appears that the PT he had was appropriate, but pt is still having sympoms with compression of L Low/mid ribs, as well as when sleeping on L side. I discussed the need to follow up with MD again for this, if symptoms are not improving. I will discuss findings with MD and inform pt of the recommended action, of seeing MD vs continuing PT. Pt will not be seen for PT for this dx at this clinic today. No Evaluation done today/ no charge.                     Objective measurements completed on examination: See above findings.                             Patient will benefit from skilled therapeutic intervention in order to improve the following deficits and impairments:     Visit Diagnosis: Neck pain     Problem List Patient Active Problem List   Diagnosis Date Noted  . Depression, major,  single episode, complete remission ([place]) 03/09/2018  . Erectile dysfunction 03/09/2018  . Obesity 03/09/2018  . Abnormal gait 01/02/2018  . 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 Osteoarthritis of spine 11/26/2017  . Knee pain 10/31/2017  . Thoracic spondylosis 10/28/2017  . Thoracic radiculopathy 05/23/2017  . Meralgia paresthetica of right side 05/23/2017  . AML (acute myeloid leukemia) in remission ([place]) 05/16/2015  . Hypothyroidism (acquired) 05/16/2015  . Vitamin B12 deficiency 10/14/2011    Lyndee Hensen, PT, DPT 3:57 PM  07/09/18    [place], [place], [address] Phone: [phone]   Fax:  [phone]  Name: Zachary Little MRN: [id number] Date of Birth: 26-Jul-1981

## 2018-07-09 NOTE — Therapy (Deleted)
[facility] [address] [place], [place], [address] Phone: [phone]   Fax:  [phone]  Patient Details  Name: Dontavian Marchi MRN: [id number] Date of Birth: 05-24-1981 Referring Provider:  [name], PA  Encounter Date: 07/09/2018   Lyndee Hensen 07/09/2018, 3:56 PM  [facility] [address] [place], [place], [address] Phone: [phone]   Fax:  [phone]

## 2018-07-15 ENCOUNTER — Encounter: Payer: Self-pay | Admitting: Physical Therapy

## 2018-07-30 ENCOUNTER — Ambulatory Visit (INDEPENDENT_AMBULATORY_CARE_PROVIDER_SITE_OTHER): Payer: No Typology Code available for payment source | Admitting: Physical Therapy

## 2018-07-30 ENCOUNTER — Encounter: Payer: Self-pay | Admitting: Physical Therapy

## 2018-07-30 DIAGNOSIS — M542 Cervicalgia: Secondary | ICD-10-CM | POA: Diagnosis not present

## 2018-07-30 DIAGNOSIS — M62830 Muscle spasm of back: Secondary | ICD-10-CM

## 2018-07-30 NOTE — Patient Instructions (Signed)
Access Code: [email or web address]  URL: [email or web address]  Date: 07/30/2018  Prepared by: Lyndee Hensen   Exercises  Seated Cervical Retraction - 10 reps - 2 sets - 2x daily  Seated Cervical Rotation AROM - 10 reps - 2 sets - 2x daily  Seated Shoulder Rolls - 10 reps - 2 sets - 2x daily  Seated Scapular Retraction - 10 reps - 2 sets - 2x daily  Doorway Pec Stretch at 60 Degrees Abduction - 3 sets - 30 hold - 2x daily  Seated Cervical Sidebending Stretch - 3 sets - 30 hold - 2x daily  Seated Levator Scapulae Stretch - 3 reps - 30 hold - 2x daily

## 2018-07-30 NOTE — Addendum Note (Signed)
Addended by: Lyndee Hensen on: 07/30/2018 12:31 PM   Modules accepted: Orders

## 2018-07-30 NOTE — Therapy (Signed)
[facility] [address] [place], [place], [address] Phone: [phone]   Fax:  [phone]  Physical Therapy Evaluation  Patient Details  Name: Zachary Little MRN: [id number] Date of Birth: [birth year] Referring Provider: [name], PA   Encounter Date: 07/30/2018  PT End of Session - 07/30/18 1215    Visit Number  1    Number of Visits  12    Date for PT Re-Evaluation  09/10/18    Authorization Type  FOCUS    PT Start Time  0845    PT Stop Time  0923    PT Time Calculation (min)  38 min    Activity Tolerance  Patient tolerated treatment well    Behavior During Therapy  [facility] for tasks assessed/performed       Past Medical History:  Diagnosis Date  . Anxiety   . Blood transfusion without reported diagnosis 2010   During Chemo Treatments  . 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 Thyroid disease     History reviewed. No pertinent surgical history.  There were no vitals filed for this visit.   Subjective Assessment - 07/30/18 0843    Subjective  Pt states ongoing pain in bilateral upper trap and neck region, for about 2 months now. He reports stiffness in neck , worse in AM, and at end of the day. He has desk job/ stand up desk option. Denies headaches or numbness in UE.  He is L handed. Has burning pain into R shoulder at times.     Limitations  Sitting;Reading;Lifting;House hold activities    Patient Stated Goals  Decreased pain.     Currently in Pain?  Yes    Pain Score  4     Pain Orientation  Right;Left    Pain Descriptors / Indicators  Aching;Tightness    Pain Type  Acute pain    Pain Onset  More than a month ago    Pain Frequency  Intermittent    Aggravating Factors   Cervical ROM, desk work.     Pain Relieving Factors  none         [facility] PT Assessment - 07/30/18 0001      Assessment   Medical Diagnosis  Neck Pain    Referring Provider  [name], PA    Hand Dominance  Left    Prior Therapy  None      Precautions   Precautions  None      Balance Screen   Has the patient fallen in the past 6 months  No      Prior Function   Level of Independence  Independent      Cognition   Overall Cognitive Status  Within Functional Limits for tasks assessed      ROM / Strength   AROM / PROM / Strength  AROM;Strength      AROM   AROM Assessment Site  Cervical    Cervical Flexion  min limitation    Cervical Extension  mild/mod limitation    Cervical - Right Side Bend  mild limitation    Cervical - Left Side Bend  mild limitation    Cervical - Right Rotation  mild limitation    Cervical - Left Rotation  mild limitation      Strength   Overall Strength Comments  Shoulder: 4/5;  Scapular: 4-/5;       Palpation   Palpation comment  Tightness, tenderness, and trigger points in bil upper  traps and levator region.       Special Tests   Other special tests  No ULTT, negative cervical tests ; No pain in c-spine                 Objective measurements completed on examination: See above findings.      [facility] Adult PT Treatment/Exercise - 07/30/18 0001      Exercises   Exercises  Neck      Neck Exercises: Seated   Neck Retraction  20 reps    Cervical Rotation  10 reps    Shoulder Rolls  20 reps    Other Seated Exercise  Scap squeeze x20;       Neck Exercises: Stretches   Upper Trapezius Stretch  2 reps;30 seconds    Levator Stretch  2 reps;30 seconds    Corner Stretch  30 seconds;2 reps    Corner Stretch Limitations  at 60 and 90 deg;              PT Education - 07/30/18 1215    Education Details  PT POC, HEP    Person(s) Educated  Patient    Methods  Explanation;Demonstration;Verbal cues;Handout    Comprehension  Verbalized understanding;Need further instruction       PT Short Term Goals - 07/30/18 1224      PT SHORT TERM GOAL #1   Title  Pt to be independent with initial HEP     Time  2    Period  Weeks    Status  New    Target  Date  08/13/18      PT SHORT TERM GOAL #2   Title  Pt to report decreased pain in upper back/trap region to 0-3/10     Time  2    Period  Weeks    Status  New    Target Date  08/13/18        PT Long Term Goals - 07/30/18 1224      PT LONG TERM GOAL #1   Title  Pt to report decreased pain to 0-2/10 , to improve ability for work duties.     Time  6    Period  Weeks    Status  New    Target Date  09/10/18      PT LONG TERM GOAL #2   Title  Pt to demo improved cervical ROM, to be WNL and pain free, to improve ability for head turns while driving.     Time  6    Period  Weeks    Status  New    Target Date  09/10/18      PT LONG TERM GOAL #3   Title  Pt to demo imrpoved strength of Shoulders and scapular muscles, to improve stability and pain.     Time  6    Period  Weeks    Status  New    Target Date  09/10/18      PT LONG TERM GOAL #4   Title  Pt to be indepndent with final HEP for postural strengtening.     Time  6    Period  Weeks    Status  New    Target Date  09/10/18             Plan - 07/30/18 1217    Clinical Impression Statement  Pt presents with primary complaint of increased pain in neck, shoulders, and upper back. He has mild decrease  in cervical ROM. He has poor seated posture, with rounded shoulders and thoracic kyphosis, and weakness in postural and scapular muscles. He has painful trigger points in Bil upper traps, and levator region. He has decreased ability for full functoinal activities, due to pain. Pt to benefit from skilled PT to improve symptoms.     Clinical Presentation  Stable    Clinical Decision Making  Low    Rehab Potential  Good    PT Frequency  2x / week    PT Duration  6 weeks    PT Treatment/Interventions  ADLs/Self Care Home Management;Cryotherapy;Electrical Stimulation;Iontophoresis 4mg /ml Dexamethasone;Moist Heat;Therapeutic activities;Functional mobility training;Ultrasound;Therapeutic exercise;Neuromuscular  re-education;Patient/family education;Dry needling;Passive range of motion;Manual techniques;Taping    Consulted and Agree with Plan of Care  Patient       Patient will benefit from skilled therapeutic intervention in order to improve the following deficits and impairments:  Hypomobility, Decreased activity tolerance, Decreased strength, Pain, Increased muscle spasms, Decreased mobility, Decreased range of motion, Improper body mechanics, Impaired flexibility  Visit Diagnosis: Neck pain  Muscle spasm of back     Problem List Patient Active Problem List   Diagnosis Date Noted  . Depression, major, single episode, complete remission ([place]) 03/09/2018  . Erectile dysfunction 03/09/2018  . Obesity 03/09/2018  . Abnormal gait 01/02/2018  . 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 Osteoarthritis of spine 11/26/2017  . Knee pain 10/31/2017  . Thoracic spondylosis 10/28/2017  . Thoracic radiculopathy 05/23/2017  . Meralgia paresthetica of right side 05/23/2017  . AML (acute myeloid leukemia) in remission ([place]) 05/16/2015  . Hypothyroidism (acquired) 05/16/2015  . Vitamin B12 deficiency 10/14/2011   Lyndee Hensen, PT, DPT 12:29 PM  07/30/18     [facility] [place], [place], [address] Phone: [phone]   Fax:  [phone]  Name: Zachary Little MRN: [id number] Date of Birth: September 09, 1981

## 2018-08-03 ENCOUNTER — Ambulatory Visit (INDEPENDENT_AMBULATORY_CARE_PROVIDER_SITE_OTHER): Payer: No Typology Code available for payment source | Admitting: Physical Therapy

## 2018-08-03 ENCOUNTER — Encounter: Payer: Self-pay | Admitting: Physical Therapy

## 2018-08-03 DIAGNOSIS — M542 Cervicalgia: Secondary | ICD-10-CM

## 2018-08-03 DIAGNOSIS — M62830 Muscle spasm of back: Secondary | ICD-10-CM | POA: Diagnosis not present

## 2018-08-03 NOTE — Therapy (Signed)
[facility] [address] [place], [place], [address] Phone: [phone]   Fax:  [phone]  Physical Therapy Treatment  Patient Details  Name: Zachary Little MRN: [id number] Date of Birth: 21-Aug-1981 Referring Provider: [name], PA   Encounter Date: 08/03/2018  PT End of Session - 08/03/18 0933    Visit Number  2    Number of Visits  12    Date for PT Re-Evaluation  09/10/18    Authorization Type  FOCUS    PT Start Time  [phone]    PT Stop Time  0930    PT Time Calculation (min)  48 min    Activity Tolerance  Patient tolerated treatment well    Behavior During Therapy  [facility] for tasks assessed/performed       Past Medical History:  Diagnosis Date  . Anxiety   . Blood transfusion without reported diagnosis 2010   During Chemo Treatments  . 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 Thyroid disease     History reviewed. No pertinent surgical history.  There were no vitals filed for this visit.  Subjective Assessment - 08/03/18 0932    Subjective  patient wants to review pec stretch - is feeling the stretch in Biceps; otherwise good compliance with HEP    Limitations  Sitting;Reading;Lifting;House hold activities    Patient Stated Goals  Decreased pain.     Currently in Pain?  Yes    Pain Score  3     Pain Location  Shoulder   and neck   Pain Orientation  Right;Left    Pain Descriptors / Indicators  Aching;Tightness    Pain Type  Acute pain                       [facility] Adult PT Treatment/Exercise - 08/03/18 0910      Exercises   Exercises  Neck      Neck Exercises: Theraband   Shoulder Extension  15 reps;Green    Rows  15 reps;Green    Other Theraband Exercises  B horizontal abd + scap squeeze - red tband x 12 reps    Other Theraband Exercises  B ER + scap squeeze - red tband x 12 reps      Neck Exercises: Standing   Wall Push Ups Limitations  12 reps; 12 reps push up plus       Neck Exercises: Prone   Rows  15 reps;Weights    Rows Weights (lbs)  2    Rows Limitations  single UE to work on unilateral stabilization      Manual Therapy   Manual Therapy  Soft tissue mobilization;Myofascial release;Passive ROM;Manual Traction    Manual therapy comments  patient supine    Soft tissue mobilization  STM to B suboccipital mm, B UT, B LS, B infra    Myofascial Release  manual trigger point release B UT    Passive ROM  PROM C-spine into all planes with light overpressure    Manual Traction  B UT stretch 2 x 30      Neck Exercises: Stretches   Corner Stretch  30 seconds;2 reps    Corner Stretch Limitations  at 90 deg - modified to single UE to prevent pain/compensations               PT Short Term Goals - 07/30/18 1224      PT SHORT TERM GOAL #1   Title  Pt to be independent with initial HEP     Time  2    Period  Weeks    Status  New    Target Date  08/13/18      PT SHORT TERM GOAL #2   Title  Pt to report decreased pain in upper back/trap region to 0-3/10     Time  2    Period  Weeks    Status  New    Target Date  08/13/18        PT Long Term Goals - 07/30/18 1224      PT LONG TERM GOAL #1   Title  Pt to report decreased pain to 0-2/10 , to improve ability for work duties.     Time  6    Period  Weeks    Status  New    Target Date  09/10/18      PT LONG TERM GOAL #2   Title  Pt to demo improved cervical ROM, to be WNL and pain free, to improve ability for head turns while driving.     Time  6    Period  Weeks    Status  New    Target Date  09/10/18      PT LONG TERM GOAL #3   Title  Pt to demo imrpoved strength of Shoulders and scapular muscles, to improve stability and pain.     Time  6    Period  Weeks    Status  New    Target Date  09/10/18      PT LONG TERM GOAL #4   Title  Pt to be indepndent with final HEP for postural strengtening.     Time  6    Period  Weeks    Status  New    Target Date  09/10/18             Plan - 08/03/18 0933    Clinical Impression Statement  Patient doing well today - reports good compliance with HEP. Review of doorway pec stretch with needed modification to perform unilaterally to prevent discomfort/compensations with good tolerance. Progression of upperback/periscapular strengthening today with need for cueing to reduced UT involvement throughout session. Updated HEP. Will continue to progress.     Rehab Potential  Good    PT Frequency  2x / week    PT Duration  6 weeks    PT Treatment/Interventions  ADLs/Self Care Home Management;Cryotherapy;Electrical Stimulation;Iontophoresis 4mg /ml Dexamethasone;Moist Heat;Therapeutic activities;Functional mobility training;Ultrasound;Therapeutic exercise;Neuromuscular re-education;Patient/family education;Dry needling;Passive range of motion;Manual techniques;Taping    Consulted and Agree with Plan of Care  Patient       Patient will benefit from skilled therapeutic intervention in order to improve the following deficits and impairments:  Hypomobility, Decreased activity tolerance, Decreased strength, Pain, Increased muscle spasms, Decreased mobility, Decreased range of motion, Improper body mechanics, Impaired flexibility  Visit Diagnosis: Neck pain  Muscle spasm of back     Problem List Patient Active Problem List   Diagnosis Date Noted  . Depression, major, single episode, complete remission ([place]) 03/09/2018  . Erectile dysfunction 03/09/2018  . Obesity 03/09/2018  . Abnormal gait 01/02/2018  . 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 Osteoarthritis of spine 11/26/2017  . Knee pain 10/31/2017  . Thoracic spondylosis 10/28/2017  . Thoracic radiculopathy 05/23/2017  . Meralgia paresthetica of right side 05/23/2017  . AML (acute myeloid leukemia) in remission ([place]) 05/16/2015  . Hypothyroidism (acquired) 05/16/2015  . Vitamin B12 deficiency  10/14/2011   Lanney Gins, PT, DPT 08/03/18 9:36  AM Pager: [phone]  [facility] [place], [place], [address] Phone: [phone]   Fax:  [phone]  Name: Rodolfo Gaster MRN: [id number] Date of Birth: 1981/04/18

## 2018-08-06 ENCOUNTER — Encounter: Payer: Self-pay | Admitting: Physical Therapy

## 2018-08-06 ENCOUNTER — Ambulatory Visit (INDEPENDENT_AMBULATORY_CARE_PROVIDER_SITE_OTHER): Payer: No Typology Code available for payment source | Admitting: Physical Therapy

## 2018-08-06 DIAGNOSIS — M62830 Muscle spasm of back: Secondary | ICD-10-CM

## 2018-08-06 DIAGNOSIS — M542 Cervicalgia: Secondary | ICD-10-CM

## 2018-08-06 NOTE — Therapy (Signed)
[facility] [address] [place], [place], [address] Phone: [phone]   Fax:  [phone]  Physical Therapy Treatment  Patient Details  Name: Zachary Little MRN: [id number] Date of Birth: 06/24/1981 Referring Provider: [name], PA   Encounter Date: 08/06/2018  PT End of Session - 08/06/18 1453    Visit Number  3    Number of Visits  12    Date for PT Re-Evaluation  09/10/18    Authorization Type  FOCUS    PT Start Time  1216    PT Stop Time  1258    PT Time Calculation (min)  42 min    Activity Tolerance  Patient tolerated treatment well    Behavior During Therapy  [facility] for tasks assessed/performed       Past Medical History:  Diagnosis Date  . Anxiety   . Blood transfusion without reported diagnosis 2010   During Chemo Treatments  . 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 Thyroid disease     History reviewed. No pertinent surgical history.  There were no vitals filed for this visit.  Subjective Assessment - 08/06/18 1450    Subjective  Pt states that he feels he is improving, less pain, and is doing HEP.     Limitations  Sitting;Reading;House hold activities;Lifting    Patient Stated Goals  Decreased pain.     Currently in Pain?  Yes    Pain Score  2     Pain Location  Shoulder    Pain Orientation  Right;Left    Pain Descriptors / Indicators  Aching;Tightness    Pain Type  Acute pain    Pain Onset  More than a month ago    Pain Frequency  Intermittent                       [facility] Adult PT Treatment/Exercise - 08/06/18 1213      Exercises   Exercises  Neck      Neck Exercises: Theraband   Shoulder Extension  Green;20 reps    Rows  Green;20 reps    Other Theraband Exercises  B horizontal abd + scap squeeze - red tband 2x10  reps    Other Theraband Exercises  B ER + scap squeeze - small Bl tband x15;       Neck Exercises: Standing   Wall Push Ups Limitations  --      Neck Exercises: Seated   Neck Retraction  20 reps      Neck Exercises: Supine   Other Supine Exercise  AAROM with cane / shoulder x20;       Neck Exercises: Prone   Rows  --    Rows Weights (lbs)  --    Other Prone Exercise  Horizontal Abd (T's) x15;       Manual Therapy   Manual Therapy  Soft tissue mobilization;Myofascial release;Passive ROM;Manual Traction    Manual therapy comments  patient supine    Soft tissue mobilization  STM/DTM to R UT, levator, rhomboid    Myofascial Release  manual trigger point release R UT    Passive ROM  PROM C-spine into all planes with light overpressure; Manual levator stretch;     Manual Traction  --      Neck Exercises: Stretches   Levator Stretch  2 reps;30 seconds    Corner Stretch  --    Warehouse manager Limitations  --  PT Short Term Goals - 07/30/18 1224      PT SHORT TERM GOAL #1   Title  Pt to be independent with initial HEP     Time  2    Period  Weeks    Status  New    Target Date  08/13/18      PT SHORT TERM GOAL #2   Title  Pt to report decreased pain in upper back/trap region to 0-3/10     Time  2    Period  Weeks    Status  New    Target Date  08/13/18        PT Long Term Goals - 07/30/18 1224      PT LONG TERM GOAL #1   Title  Pt to report decreased pain to 0-2/10 , to improve ability for work duties.     Time  6    Period  Weeks    Status  New    Target Date  09/10/18      PT LONG TERM GOAL #2   Title  Pt to demo improved cervical ROM, to be WNL and pain free, to improve ability for head turns while driving.     Time  6    Period  Weeks    Status  New    Target Date  09/10/18      PT LONG TERM GOAL #3   Title  Pt to demo imrpoved strength of Shoulders and scapular muscles, to improve stability and pain.     Time  6    Period  Weeks    Status  New    Target Date  09/10/18      PT LONG TERM GOAL #4   Title  Pt to be indepndent with final HEP for postural strengtening.     Time  6     Period  Weeks    Status  New    Target Date  09/10/18            Plan - 08/06/18 1455    Clinical Impression Statement  Pt with decreasing pain levels. He does have tightness and trigger points present in R upper trap and rhomboid region, manual therapy done for this. Pt benefitting from ongoing education on upper body posture, at rest and with ther ex. Plan to progress as tolerated.     Rehab Potential  Good    PT Frequency  2x / week    PT Duration  6 weeks    PT Treatment/Interventions  ADLs/Self Care Home Management;Cryotherapy;Electrical Stimulation;Iontophoresis 4mg /ml Dexamethasone;Moist Heat;Therapeutic activities;Functional mobility training;Ultrasound;Therapeutic exercise;Neuromuscular re-education;Patient/family education;Dry needling;Passive range of motion;Manual techniques;Taping    Consulted and Agree with Plan of Care  Patient       Patient will benefit from skilled therapeutic intervention in order to improve the following deficits and impairments:  Hypomobility, Decreased activity tolerance, Decreased strength, Pain, Increased muscle spasms, Decreased mobility, Decreased range of motion, Improper body mechanics, Impaired flexibility  Visit Diagnosis: Neck pain  Muscle spasm of back     Problem List Patient Active Problem List   Diagnosis Date Noted  . Depression, major, single episode, complete remission ([place]) 03/09/2018  . Erectile dysfunction 03/09/2018  . Obesity 03/09/2018  . Abnormal gait 01/02/2018  . 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 Osteoarthritis of spine 11/26/2017  . Knee pain 10/31/2017  . Thoracic spondylosis 10/28/2017  . Thoracic radiculopathy 05/23/2017  . Meralgia paresthetica of right side 05/23/2017  .  AML (acute myeloid leukemia) in remission ([place]) 05/16/2015  . Hypothyroidism (acquired) 05/16/2015  . Vitamin B12 deficiency 10/14/2011    Lyndee Hensen, PT, DPT 2:57 PM  08/06/18    [facility] [place], [place], [address] Phone: [phone]   Fax:  [phone]  Name: Zachary Little MRN: [id number] Date of Birth: 1981-03-28

## 2018-08-10 ENCOUNTER — Ambulatory Visit (INDEPENDENT_AMBULATORY_CARE_PROVIDER_SITE_OTHER): Payer: No Typology Code available for payment source | Admitting: Physical Therapy

## 2018-08-10 ENCOUNTER — Encounter: Payer: Self-pay | Admitting: Physical Therapy

## 2018-08-10 DIAGNOSIS — M62830 Muscle spasm of back: Secondary | ICD-10-CM

## 2018-08-10 DIAGNOSIS — M542 Cervicalgia: Secondary | ICD-10-CM

## 2018-08-10 NOTE — Therapy (Signed)
[facility] [address] [place], [place], [address] Phone: [phone]   Fax:  [phone]  Physical Therapy Treatment  Patient Details  Name: Zachary Little MRN: [id number] Date of Birth: Apr 28, 1981 Referring Provider: [name], PA   Encounter Date: 08/10/2018  PT End of Session - 08/10/18 1022    Visit Number  4    Number of Visits  12    Date for PT Re-Evaluation  09/10/18    Authorization Type  FOCUS    PT Start Time  0847    PT Stop Time  0930    PT Time Calculation (min)  43 min    Activity Tolerance  Patient tolerated treatment well    Behavior During Therapy  [facility] for tasks assessed/performed       Past Medical History:  Diagnosis Date  . Anxiety   . Blood transfusion without reported diagnosis 2010   During Chemo Treatments  . 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 Thyroid disease     History reviewed. No pertinent surgical history.  There were no vitals filed for this visit.  Subjective Assessment - 08/10/18 1022    Subjective  Pt states that he has most pain in AM, most painful spots seem to be in bil UT/levator area.     Currently in Pain?  Yes    Pain Score  2     Pain Location  Shoulder    Pain Orientation  Right;Left    Pain Descriptors / Indicators  Aching;Tightness    Pain Type  Acute pain    Pain Onset  More than a month ago    Pain Frequency  Intermittent                       [facility] Adult PT Treatment/Exercise - 08/10/18 0850      Exercises   Exercises  Neck      Neck Exercises: Theraband   Shoulder Extension  Green;20 reps    Rows  Green;20 reps    Other Theraband Exercises  B horizontal abd + scap squeeze - red tband 2x10  reps    Other Theraband Exercises  B ER + scap squeeze - small Bl tband x15;       Neck Exercises: Standing   Other Standing Exercises  Shoulder scaption 2x10; Abd to 90 deg  2x10;  at wall for posture;       Neck Exercises:  Seated   Neck Retraction  20 reps    Cervical Rotation  5 reps    Other Seated Exercise  cervical exension x5;       Neck Exercises: Supine   Other Supine Exercise  --      Neck Exercises: Sidelying   Other Sidelying Exercise  shoulder abd x10 bil; Shoulder horizontal abd x15 bil;       Neck Exercises: Prone   Other Prone Exercise  Horizontal Abd (T's) x15;       Manual Therapy   Manual Therapy  Soft tissue mobilization;Myofascial release;Passive ROM;Manual Traction    Manual therapy comments  patient prone    Soft tissue mobilization  STM/DTM to bil UT, levator, rhomboid    Myofascial Release  manual trigger point release R UT    Passive ROM  --      Neck Exercises: Stretches   Levator Stretch  2 reps;30 seconds    Other Neck Stretches  Fwd flexion upper back stretch 30  sec x2;                PT Short Term Goals - 07/30/18 1224      PT SHORT TERM GOAL #1   Title  Pt to be independent with initial HEP     Time  2    Period  Weeks    Status  New    Target Date  08/13/18      PT SHORT TERM GOAL #2   Title  Pt to report decreased pain in upper back/trap region to 0-3/10     Time  2    Period  Weeks    Status  New    Target Date  08/13/18        PT Long Term Goals - 07/30/18 1224      PT LONG TERM GOAL #1   Title  Pt to report decreased pain to 0-2/10 , to improve ability for work duties.     Time  6    Period  Weeks    Status  New    Target Date  09/10/18      PT LONG TERM GOAL #2   Title  Pt to demo improved cervical ROM, to be WNL and pain free, to improve ability for head turns while driving.     Time  6    Period  Weeks    Status  New    Target Date  09/10/18      PT LONG TERM GOAL #3   Title  Pt to demo imrpoved strength of Shoulders and scapular muscles, to improve stability and pain.     Time  6    Period  Weeks    Status  New    Target Date  09/10/18      PT LONG TERM GOAL #4   Title  Pt to be indepndent with final HEP for postural  strengtening.     Time  6    Period  Weeks    Status  New    Target Date  09/10/18            Plan - 08/10/18 1024    Clinical Impression Statement  Pt with tightness in R upper trap and levator region with DTM and TPR. He states no pain with DTM to these areas, but does feel pain with certain UE and cervical movements. Pt seems to be improving, improving aiblity for ther ex and postural strengthening. Will benefit from continued strengthening. Pt with difficulty verbalizing if pain is better or still bothersome to him.     Rehab Potential  Good    PT Frequency  2x / week    PT Duration  6 weeks    PT Treatment/Interventions  ADLs/Self Care Home Management;Cryotherapy;Electrical Stimulation;Iontophoresis 4mg /ml Dexamethasone;Moist Heat;Therapeutic activities;Functional mobility training;Ultrasound;Therapeutic exercise;Neuromuscular re-education;Patient/family education;Dry needling;Passive range of motion;Manual techniques;Taping    Consulted and Agree with Plan of Care  Patient       Patient will benefit from skilled therapeutic intervention in order to improve the following deficits and impairments:  Hypomobility, Decreased activity tolerance, Decreased strength, Pain, Increased muscle spasms, Decreased mobility, Decreased range of motion, Improper body mechanics, Impaired flexibility  Visit Diagnosis: Neck pain  Muscle spasm of back     Problem List Patient Active Problem List   Diagnosis Date Noted  . Depression, major, single episode, complete remission ([place]) 03/09/2018  . Erectile dysfunction 03/09/2018  . Obesity 03/09/2018  . Abnormal gait 01/02/2018  . Congenital  cavus deformity of foot 01/02/2018  . Osteoarthritis of spine 11/26/2017  . Knee pain 10/31/2017  . Thoracic spondylosis 10/28/2017  . Thoracic radiculopathy 05/23/2017  . Meralgia paresthetica of right side 05/23/2017  . AML (acute myeloid leukemia) in remission ([place]) 05/16/2015  . Hypothyroidism  (acquired) 05/16/2015  . Vitamin B12 deficiency 10/14/2011    Lyndee Hensen, PT, DPT 10:26 AM  08/10/18    [facility] [place], [place], [address] Phone: [phone]   Fax:  [phone]  Name: Zachary Little MRN: [id number] Date of Birth: Feb 12, 1981

## 2018-08-13 ENCOUNTER — Encounter: Payer: Self-pay | Admitting: Physical Therapy

## 2018-08-13 ENCOUNTER — Ambulatory Visit (INDEPENDENT_AMBULATORY_CARE_PROVIDER_SITE_OTHER): Payer: No Typology Code available for payment source | Admitting: Physical Therapy

## 2018-08-13 DIAGNOSIS — M542 Cervicalgia: Secondary | ICD-10-CM

## 2018-08-13 DIAGNOSIS — M62830 Muscle spasm of back: Secondary | ICD-10-CM | POA: Diagnosis not present

## 2018-08-13 NOTE — Therapy (Signed)
[facility] [address] [place], [place], [address] Phone: [phone]   Fax:  [phone]  Physical Therapy Treatment  Patient Details  Name: Zachary Little MRN: [id number] Date of Birth: 04-Oct-1981 Referring Provider: [name], PA   Encounter Date: 08/13/2018  PT End of Session - 08/13/18 0852    Visit Number  4    Number of Visits  12    Date for PT Re-Evaluation  09/10/18    Authorization Type  FOCUS    PT Start Time  0849    PT Stop Time  0924    PT Time Calculation (min)  35 min    Activity Tolerance  Patient tolerated treatment well    Behavior During Therapy  [facility] for tasks assessed/performed       Past Medical History:  Diagnosis Date  . Anxiety   . Blood transfusion without reported diagnosis 2010   During Chemo Treatments  . 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 Thyroid disease     History reviewed. No pertinent surgical history.  There were no vitals filed for this visit.  Subjective Assessment - 08/13/18 0851    Subjective  Pt states he had soreness yesterday in R side of neck, for about an hour, but is improved today.     Patient Stated Goals  Decreased pain.     Currently in Pain?  Yes    Pain Score  2     Pain Orientation  Right    Pain Descriptors / Indicators  Aching;Tightness    Pain Type  Acute pain    Pain Onset  More than a month ago    Pain Frequency  Intermittent    Aggravating Factors   cervical flexion                       [facility] Adult PT Treatment/Exercise - 08/13/18 0852      Exercises   Exercises  Neck      Neck Exercises: Theraband   Shoulder Extension  --    Rows  Green;20 reps    Other Theraband Exercises  --    Other Theraband Exercises  Shoulder ER GTB x20 bil;      Neck Exercises: Standing   Other Standing Exercises  Shoulder flex and abd/ full ROM 2 lb x10 each;       Neck Exercises: Seated   Neck Retraction  20 reps    Cervical  Rotation  --    Other Seated Exercise  --      Neck Exercises: Sidelying   Other Sidelying Exercise  Shoulder horizontal abd x15 bil;       Neck Exercises: Prone   Other Prone Exercise  Prone "T'" x15 bil;     Other Prone Exercise  Prone 'I" x15 bil;       Manual Therapy   Manual Therapy  Soft tissue mobilization;Myofascial release;Passive ROM;Manual Traction    Manual therapy comments  patient prone    Soft tissue mobilization  DTM/ IASTM to R levator and UT    Myofascial Release  manual trigger point release R levator      Neck Exercises: Stretches   Levator Stretch  2 reps;30 seconds    Other Neck Stretches  Fwd flexion upper back stretch 30 sec x2;                PT Short Term Goals - 07/30/18 1224  PT SHORT TERM GOAL #1   Title  Pt to be independent with initial HEP     Time  2    Period  Weeks    Status  New    Target Date  08/13/18      PT SHORT TERM GOAL #2   Title  Pt to report decreased pain in upper back/trap region to 0-3/10     Time  2    Period  Weeks    Status  New    Target Date  08/13/18        PT Long Term Goals - 07/30/18 1224      PT LONG TERM GOAL #1   Title  Pt to report decreased pain to 0-2/10 , to improve ability for work duties.     Time  6    Period  Weeks    Status  New    Target Date  09/10/18      PT LONG TERM GOAL #2   Title  Pt to demo improved cervical ROM, to be WNL and pain free, to improve ability for head turns while driving.     Time  6    Period  Weeks    Status  New    Target Date  09/10/18      PT LONG TERM GOAL #3   Title  Pt to demo imrpoved strength of Shoulders and scapular muscles, to improve stability and pain.     Time  6    Period  Weeks    Status  New    Target Date  09/10/18      PT LONG TERM GOAL #4   Title  Pt to be indepndent with final HEP for postural strengtening.     Time  6    Period  Weeks    Status  New    Target Date  09/10/18            Plan - 08/13/18 1202     Clinical Impression Statement  Pt with painful trigger point at R levator, addressed with DTM, IASTM, and TPR today. Pt declines dry needling today for trigger point release. Pt improving with ability for ther ex, and with UE strength, but does still require min/mod cueing for posture and mechanics. Pt making good improvements, Plan to progress as tolerated.     Rehab Potential  Good    PT Frequency  2x / week    PT Duration  6 weeks    PT Treatment/Interventions  ADLs/Self Care Home Management;Cryotherapy;Electrical Stimulation;Iontophoresis 4mg /ml Dexamethasone;Moist Heat;Therapeutic activities;Functional mobility training;Ultrasound;Therapeutic exercise;Neuromuscular re-education;Patient/family education;Dry needling;Passive range of motion;Manual techniques;Taping    Consulted and Agree with Plan of Care  Patient       Patient will benefit from skilled therapeutic intervention in order to improve the following deficits and impairments:  Hypomobility, Decreased activity tolerance, Decreased strength, Pain, Increased muscle spasms, Decreased mobility, Decreased range of motion, Improper body mechanics, Impaired flexibility  Visit Diagnosis: Neck pain  Muscle spasm of back     Problem List Patient Active Problem List   Diagnosis Date Noted  . Depression, major, single episode, complete remission ([place]) 03/09/2018  . Erectile dysfunction 03/09/2018  . Obesity 03/09/2018  . Abnormal gait 01/02/2018  . 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 Osteoarthritis of spine 11/26/2017  . Knee pain 10/31/2017  . Thoracic spondylosis 10/28/2017  . Thoracic radiculopathy 05/23/2017  . Meralgia paresthetica of right side 05/23/2017  . AML (acute myeloid  leukemia) in remission ([place]) 05/16/2015  . Hypothyroidism (acquired) 05/16/2015  . Vitamin B12 deficiency 10/14/2011    Lyndee Hensen, PT, DPT 12:06 PM  08/13/18    [place], [place], [address] Phone: [phone]   Fax:  [phone]  Name: Earlie Schank MRN: [id number] Date of Birth: [birth year]

## 2018-08-17 ENCOUNTER — Encounter: Payer: No Typology Code available for payment source | Admitting: Physical Therapy

## 2018-08-20 ENCOUNTER — Ambulatory Visit (INDEPENDENT_AMBULATORY_CARE_PROVIDER_SITE_OTHER): Payer: No Typology Code available for payment source | Admitting: Physical Therapy

## 2018-08-20 ENCOUNTER — Encounter: Payer: Self-pay | Admitting: Physical Therapy

## 2018-08-20 DIAGNOSIS — M542 Cervicalgia: Secondary | ICD-10-CM | POA: Diagnosis not present

## 2018-08-20 DIAGNOSIS — M62830 Muscle spasm of back: Secondary | ICD-10-CM

## 2018-08-20 NOTE — Therapy (Signed)
[facility] [address] [place], [place], [address] Phone: [phone]   Fax:  [phone]  Physical Therapy Treatment  Patient Details  Name: Zachary Little MRN: [id number] Date of Birth: [birth year] Referring Provider: [name], PA   Encounter Date: 08/20/2018  PT End of Session - 08/20/18 1057    Visit Number  6    Number of Visits  12    Date for PT Re-Evaluation  09/10/18    Authorization Type  FOCUS    PT Start Time  1012    PT Stop Time  1053    PT Time Calculation (min)  41 min    Activity Tolerance  Patient tolerated treatment well    Behavior During Therapy  [facility] for tasks assessed/performed       Past Medical History:  Diagnosis Date  . Anxiety   . Blood transfusion without reported diagnosis 2010   During Chemo Treatments  . 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 Thyroid disease     History reviewed. No pertinent surgical history.  There were no vitals filed for this visit.  Subjective Assessment - 08/20/18 1055    Subjective  Pt states soreness in R levator region has continued, but other pain and tightness is improved.     Patient Stated Goals  Decreased pain.     Currently in Pain?  Yes    Pain Score  2     Pain Location  Shoulder    Pain Orientation  Right    Pain Descriptors / Indicators  Aching;Tightness    Pain Type  Acute pain    Pain Onset  More than a month ago    Pain Frequency  Intermittent                       [facility] Adult PT Treatment/Exercise - 08/20/18 1009      Exercises   Exercises  Neck      Neck Exercises: Theraband   Rows  Green;20 reps    Other Theraband Exercises  B horizontal abd + scap squeeze - GT x20;     Other Theraband Exercises  Shoulder ER GTB x20 bil;      Neck Exercises: Standing   Other Standing Exercises  Shoulder flex and abd/ full ROM x10 each;       Neck Exercises: Seated   Neck Retraction  --      Neck Exercises:  Sidelying   Other Sidelying Exercise  --      Neck Exercises: Prone   Other Prone Exercise  Prone "T'" 2x10 1 lb    Other Prone Exercise  Prone 'I" 2x10 1 lb      Manual Therapy   Manual Therapy  Soft tissue mobilization;Myofascial release;Passive ROM;Manual Traction    Manual therapy comments  skilled palpation and monitoring of soft tissue during dry neeling.     Soft tissue mobilization  DTM/ IASTM to R levator and UT    Myofascial Release  --      Neck Exercises: Stretches   Levator Stretch  2 reps;30 seconds    Other Neck Stretches  Fwd flexion upper back stretch 30 sec x2;        Trigger Point Dry Needling - 08/20/18 1055    Consent Given?  Yes    Education Handout Provided  Yes    Muscles Treated Upper Body  Upper trapezius;Levator scapulae    Upper Trapezius Response  Twitch reponse elicited;Palpable increased muscle length    Levator Scapulae Response  Twitch response elicited;Palpable increased muscle length           PT Education - 08/20/18 1056    Education Details  Dry Needling, PT Plan    Person(s) Educated  Patient    Methods  Explanation;Handout    Comprehension  Verbalized understanding       PT Short Term Goals - 07/30/18 1224      PT SHORT TERM GOAL #1   Title  Pt to be independent with initial HEP     Time  2    Period  Weeks    Status  New    Target Date  08/13/18      PT SHORT TERM GOAL #2   Title  Pt to report decreased pain in upper back/trap region to 0-3/10     Time  2    Period  Weeks    Status  New    Target Date  08/13/18        PT Long Term Goals - 07/30/18 1224      PT LONG TERM GOAL #1   Title  Pt to report decreased pain to 0-2/10 , to improve ability for work duties.     Time  6    Period  Weeks    Status  New    Target Date  09/10/18      PT LONG TERM GOAL #2   Title  Pt to demo improved cervical ROM, to be WNL and pain free, to improve ability for head turns while driving.     Time  6    Period  Weeks     Status  New    Target Date  09/10/18      PT LONG TERM GOAL #3   Title  Pt to demo imrpoved strength of Shoulders and scapular muscles, to improve stability and pain.     Time  6    Period  Weeks    Status  New    Target Date  09/10/18      PT LONG TERM GOAL #4   Title  Pt to be indepndent with final HEP for postural strengtening.     Time  6    Period  Weeks    Status  New    Target Date  09/10/18            Plan - 08/20/18 1058    Clinical Impression Statement  Pt agreable to dry needling today. Pt with good response, twitch, and reports decreased/no pain with cervical motion after treatment today. Will continue to assess effects next visit. Plan to work towards d/c to HEP as pts pain subsides. Pt requiring less cueing for posture with ther ex, but still has tendency for rounded shoulder on R > L. Patient Consent: After explanation of Trigger Point Dry Needling (TDN)  rationale, procedures, outcomes, and potential side effects, patient verbalized consent to TDN treatment. Post treatment instructions: Patient instructed to expect mild to moderate muscle soreness this evening and tomorrow. Pt instructed to continue prescribed home exercise program. If dry needling over lung field, patient instructed of signs and symptoms of pneumothorax. Although unlikely, patient educated on seeking immediate medical attention, should symptoms occur. Pt verbalized understanding of these instructions.     Rehab Potential  Good    PT Frequency  2x / week    PT Duration  6 weeks    PT Treatment/Interventions  ADLs/Self Care Home Management;Cryotherapy;Electrical Stimulation;Iontophoresis 4mg /ml Dexamethasone;Moist Heat;Therapeutic activities;Functional mobility training;Ultrasound;Therapeutic exercise;Neuromuscular re-education;Patient/family education;Dry needling;Passive range of motion;Manual techniques;Taping    Consulted and Agree with Plan of Care  Patient       Patient will benefit from skilled  therapeutic intervention in order to improve the following deficits and impairments:  Hypomobility, Decreased activity tolerance, Decreased strength, Pain, Increased muscle spasms, Decreased mobility, Decreased range of motion, Improper body mechanics, Impaired flexibility  Visit Diagnosis: Neck pain  Muscle spasm of back     Problem List Patient Active Problem List   Diagnosis Date Noted  . Depression, major, single episode, complete remission ([place]) 03/09/2018  . Erectile dysfunction 03/09/2018  . Obesity 03/09/2018  . Abnormal gait 01/02/2018  . 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 Osteoarthritis of spine 11/26/2017  . Knee pain 10/31/2017  . Thoracic spondylosis 10/28/2017  . Thoracic radiculopathy 05/23/2017  . Meralgia paresthetica of right side 05/23/2017  . AML (acute myeloid leukemia) in remission ([place]) 05/16/2015  . Hypothyroidism (acquired) 05/16/2015  . Vitamin B12 deficiency 10/14/2011    Lyndee Hensen, PT, DPT 11:00 AM  08/20/18    [facility] [place], [place], [address] Phone: [phone]   Fax:  [phone]  Name: Zachary Little MRN: [id number] Date of Birth: [birth year]

## 2018-08-25 ENCOUNTER — Ambulatory Visit (INDEPENDENT_AMBULATORY_CARE_PROVIDER_SITE_OTHER): Payer: No Typology Code available for payment source | Admitting: Physical Therapy

## 2018-08-25 ENCOUNTER — Encounter: Payer: Self-pay | Admitting: Physical Therapy

## 2018-08-25 DIAGNOSIS — M62830 Muscle spasm of back: Secondary | ICD-10-CM

## 2018-08-25 DIAGNOSIS — M542 Cervicalgia: Secondary | ICD-10-CM

## 2018-08-25 NOTE — Therapy (Signed)
[facility] [address] [place], [place], [address] Phone: [phone]   Fax:  [phone]  Physical Therapy Treatment  Patient Details  Name: Zachary Little MRN: [id number] Date of Birth: May 06, 1981 Referring Provider (PT): [name], [place]   Encounter Date: 08/25/2018  PT End of Session - 08/25/18 1330    Visit Number  7    Number of Visits  12    Date for PT Re-Evaluation  09/10/18    Authorization Type  FOCUS    PT Start Time  0931    PT Stop Time  1013    PT Time Calculation (min)  42 min    Activity Tolerance  Patient tolerated treatment well    Behavior During Therapy  [facility] for tasks assessed/performed       Past Medical History:  Diagnosis Date  . Anxiety   . Blood transfusion without reported diagnosis 2010   During Chemo Treatments  . Leukemia, acute myeloid, in remission ([place]) 2010  . Thyroid disease     History reviewed. No pertinent surgical history.  There were no vitals filed for this visit.  Subjective Assessment - 08/25/18 1322    Subjective  Pt states continued soreness at R upper back, increased with neck flexion and rotation. He states most other pain areas improved in neck, but this continues to feel painful.     Limitations  Writing;House hold activities;Reading;Sitting    Patient Stated Goals  Decreased pain.     Currently in Pain?  Yes    Pain Score  2     Pain Location  Shoulder    Pain Orientation  Right    Pain Descriptors / Indicators  Aching;Tightness    Pain Type  Acute pain    Pain Onset  More than a month ago    Pain Frequency  Intermittent                       [facility] Adult PT Treatment/Exercise - 08/25/18 0925      Exercises   Exercises  Neck      Neck Exercises: Theraband   Shoulder Extension  Green;20 reps    Rows  Green;20 reps    Other Theraband Exercises  B horizontal abd + scap squeeze - GT x20; (at tower)     Other Theraband Exercises  Shoulder ER  GTB x20 bil;      Neck Exercises: Standing   Other Standing Exercises  Shoulder flex and abd/ full ROM x10 each;     Other Standing Exercises  Overhead press 3lb x20;       Neck Exercises: Seated   Other Seated Exercise  Lat pull down BL TB x20;       Neck Exercises: Prone   Other Prone Exercise  Prone "T'" 2x10 1 lb    Other Prone Exercise  Prone 'I" 2x10 1 lb      Manual Therapy   Manual Therapy  Soft tissue mobilization;Myofascial release;Passive ROM;Manual Traction;Joint mobilization    Manual therapy comments  skilled palpation and monitoring of soft tissue during dry neeling.     Joint Mobilization  PA mobs for c-spine and t-spine;     Soft tissue mobilization  DTM/ IASTM to R levator and UT      Neck Exercises: Stretches   Levator Stretch  2 reps;30 seconds    Other Neck Stretches  --       Trigger Point Dry Needling -  08/25/18 1321    Consent Given?  Yes    Muscles Treated Upper Body  Upper trapezius;Levator scapulae    Upper Trapezius Response  Twitch reponse elicited;Palpable increased muscle length    Levator Scapulae Response  Palpable increased muscle length             PT Short Term Goals - 07/30/18 1224      PT SHORT TERM GOAL #1   Title  Pt to be independent with initial HEP     Time  2    Period  Weeks    Status  New    Target Date  08/13/18      PT SHORT TERM GOAL #2   Title  Pt to report decreased pain in upper back/trap region to 0-3/10     Time  2    Period  Weeks    Status  New    Target Date  08/13/18        PT Long Term Goals - 07/30/18 1224      PT LONG TERM GOAL #1   Title  Pt to report decreased pain to 0-2/10 , to improve ability for work duties.     Time  6    Period  Weeks    Status  New    Target Date  09/10/18      PT LONG TERM GOAL #2   Title  Pt to demo improved cervical ROM, to be WNL and pain free, to improve ability for head turns while driving.     Time  6    Period  Weeks    Status  New    Target Date   09/10/18      PT LONG TERM GOAL #3   Title  Pt to demo imrpoved strength of Shoulders and scapular muscles, to improve stability and pain.     Time  6    Period  Weeks    Status  New    Target Date  09/10/18      PT LONG TERM GOAL #4   Title  Pt to be indepndent with final HEP for postural strengtening.     Time  6    Period  Weeks    Status  New    Target Date  09/10/18            Plan - 08/25/18 1333    Clinical Impression Statement  Pt with minimal pain to palpate R levator after manual therapy and dry needling today. Pt states decreased pain after session, but continues to have pain return in AM and during the day with work duties. Pt showing improvements with posture and strengthening with ther ex today.     Rehab Potential  Good    PT Frequency  2x / week    PT Duration  6 weeks    PT Treatment/Interventions  ADLs/Self Care Home Management;Cryotherapy;Electrical Stimulation;Iontophoresis 4mg /ml Dexamethasone;Moist Heat;Therapeutic activities;Functional mobility training;Ultrasound;Therapeutic exercise;Neuromuscular re-education;Patient/family education;Dry needling;Passive range of motion;Manual techniques;Taping    Consulted and Agree with Plan of Care  Patient       Patient will benefit from skilled therapeutic intervention in order to improve the following deficits and impairments:  Hypomobility, Decreased activity tolerance, Decreased strength, Pain, Increased muscle spasms, Decreased mobility, Decreased range of motion, Improper body mechanics, Impaired flexibility  Visit Diagnosis: Neck pain  Muscle spasm of back     Problem List Patient Active Problem List   Diagnosis Date Noted  . Depression, major, single  episode, complete remission ([place]) 03/09/2018  . Erectile dysfunction 03/09/2018  . Obesity 03/09/2018  . Abnormal gait 01/02/2018  . 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 Osteoarthritis of spine 11/26/2017  . Knee pain 10/31/2017  .  Thoracic spondylosis 10/28/2017  . Thoracic radiculopathy 05/23/2017  . Meralgia paresthetica of right side 05/23/2017  . AML (acute myeloid leukemia) in remission ([place]) 05/16/2015  . Hypothyroidism (acquired) 05/16/2015  . Vitamin B12 deficiency 10/14/2011   Lyndee Hensen, PT, DPT 1:35 PM  08/25/18    [facility] [place], [place], [address] Phone: [phone]   Fax:  [phone]  Name: Zachary Little MRN: [id number] Date of Birth: Oct 18, 1981

## 2018-09-01 ENCOUNTER — Ambulatory Visit (INDEPENDENT_AMBULATORY_CARE_PROVIDER_SITE_OTHER): Payer: No Typology Code available for payment source | Admitting: Physical Therapy

## 2018-09-01 ENCOUNTER — Encounter: Payer: Self-pay | Admitting: Physical Therapy

## 2018-09-01 DIAGNOSIS — M542 Cervicalgia: Secondary | ICD-10-CM

## 2018-09-01 DIAGNOSIS — M62830 Muscle spasm of back: Secondary | ICD-10-CM | POA: Diagnosis not present

## 2018-09-01 NOTE — Therapy (Addendum)
[facility] [address] [place], [place], [address] Phone: [phone]   Fax:  [phone]  Physical Therapy Treatment/DIscharge  Patient Details  Name: Zachary Little MRN: [id number] Date of Birth: May 24, 1981 Referring Provider (PT): [name], [place]   Encounter Date: 09/01/2018  PT End of Session - 09/01/18 1231    Visit Number  8    Number of Visits  12    Date for PT Re-Evaluation  09/10/18    Authorization Type  FOCUS    PT Start Time  1220    PT Stop Time  1255    PT Time Calculation (min)  35 min    Activity Tolerance  Patient tolerated treatment well    Behavior During Therapy  [facility] for tasks assessed/performed       Past Medical History:  Diagnosis Date  . Anxiety   . Blood transfusion without reported diagnosis 2010   During Chemo Treatments  . Leukemia, acute myeloid, in remission ([place]) 2010  . Thyroid disease     History reviewed. No pertinent surgical history.  There were no vitals filed for this visit.  Subjective Assessment - 09/01/18 1230    Subjective  Pt states continued pain in upper back, he states it is "no better than when i started". He has reported improvements in posture, and improved activity level, doing HEP. He is able to get the pain to go away (with stretching, HEP and posture), but then pain returns, if he is sitting/working for 30 min- 1 hour.      Limitations  Writing;House hold activities;Reading;Sitting    Patient Stated Goals  Decreased pain.     Currently in Pain?  Yes    Pain Score  3     Pain Location  Shoulder    Pain Orientation  Right    Pain Descriptors / Indicators  Aching;Tightness    Pain Type  Chronic pain;Acute pain    Pain Onset  More than a month ago    Pain Frequency  Intermittent    Aggravating Factors   seated computer work.     Pain Relieving Factors  HEP, stretching                       [facility] Adult PT Treatment/Exercise - 09/01/18 1232       Exercises   Exercises  Neck      Neck Exercises: Theraband   Shoulder Extension  Green;20 reps    Rows  Green;20 reps    Other Theraband Exercises  B horizontal abd + scap squeeze - GT x20; (at tower)     Other Theraband Exercises  Shoulder ER GTB x20 bil;      Neck Exercises: Standing   Other Standing Exercises  Shoulder flex and abd/ full ROM x10 each;     Other Standing Exercises  Overhead press 3lb x20;       Neck Exercises: Seated   Other Seated Exercise  --      Neck Exercises: Prone   Rows  20 reps    Rows Weights (lbs)  2    Other Prone Exercise  Prone "T'" 2x10 2 lb    Other Prone Exercise  --      Manual Therapy   Manual Therapy  Soft tissue mobilization;Myofascial release;Passive ROM;Manual Traction;Joint mobilization    Manual therapy comments  --    Joint Mobilization  --    Soft tissue mobilization  --  Manual Traction  10 sec x10      Neck Exercises: Stretches   Levator Stretch  --             PT Education - 09/01/18 1341    Education Details  HEP, recommended home use of cervical traction if pain does not subside.     Person(s) Educated  Patient    Methods  Explanation;Handout    Comprehension  Verbalized understanding       PT Short Term Goals - 09/01/18 1343      PT SHORT TERM GOAL #1   Title  Pt to be independent with initial HEP     Time  2    Period  Weeks    Status  Achieved      PT SHORT TERM GOAL #2   Title  Pt to report decreased pain in upper back/trap region to 0-3/10     Time  2    Period  Weeks    Status  Achieved        PT Long Term Goals - 09/01/18 1344      PT LONG TERM GOAL #1   Title  Pt to report decreased pain to 0-2/10 , to improve ability for work duties.     Time  6    Period  Weeks    Status  Not Met      PT LONG TERM GOAL #2   Title  Pt to demo improved cervical ROM, to be WNL and pain free, to improve ability for head turns while driving.     Time  6    Period  Weeks    Status  Partially Met       PT LONG TERM GOAL #3   Title  Pt to demo imrpoved strength of Shoulders and scapular muscles, to improve stability and pain.     Time  6    Period  Weeks    Status  Achieved      PT LONG TERM GOAL #4   Title  Pt to be indepndent with final HEP for postural strengtening.     Time  6    Period  Weeks    Status  Achieved            Plan - 09/01/18 1345    Clinical Impression Statement  Pt with improved strength, and improved pain, and pain with cervcial ROM. He has had several times, at end of session, that he has demonstrated full cervical ROM without pain. But pt continues to report soreness today with full R cervical rotation. He has no pain to palpate levator region, and has had good results from DTM and dry needling. Pt reports continued, variable soreness, that comes and goes based on how long he is sitting/working for, and is able to get relief with stretching. Pt is concerned because pain has not fully resolved 100%. Pain unable to be elicited during session. Pt denies numbness, tingling, or radicular pain. Based on presentation and MRI from January, it is unlikely that pt has cervical nerve involvement, but did recommend that he try cervical traction home unit, if pain does not subside. He reports increased pain in upper back when sittingup against something with head pushed forward slightly. Also recommended that pt continue HEP and postural training, as this is likely the cause of him feeling muscle pull in levator region.  Pt has no further functional deficits that require continued care. Pt will be discharged at this time,   due to no deficits, but continued reports of unchanged pain. Also recommended pt follow up with MD if pain does not improve. Pt in agreement with plan.     Rehab Potential  Good    PT Frequency  2x / week    PT Duration  6 weeks    PT Treatment/Interventions  ADLs/Self Care Home Management;Cryotherapy;Electrical Stimulation;Iontophoresis 4mg/ml  Dexamethasone;Moist Heat;Therapeutic activities;Functional mobility training;Ultrasound;Therapeutic exercise;Neuromuscular re-education;Patient/family education;Dry needling;Passive range of motion;Manual techniques;Taping    Consulted and Agree with Plan of Care  Patient       Patient will benefit from skilled therapeutic intervention in order to improve the following deficits and impairments:  Hypomobility, Decreased activity tolerance, Decreased strength, Pain, Increased muscle spasms, Decreased mobility, Decreased range of motion, Improper body mechanics, Impaired flexibility  Visit Diagnosis: Neck pain  Muscle spasm of back     Problem List Patient Active Problem List   Diagnosis Date Noted  . Depression, major, single episode, 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  . Erectile dysfunction 03/09/2018  . Obesity 03/09/2018  . Abnormal gait 01/02/2018  . 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 Osteoarthritis of spine 11/26/2017  . Knee pain 10/31/2017  . Thoracic spondylosis 10/28/2017  . Thoracic radiculopathy 05/23/2017  . Meralgia paresthetica of right side 05/23/2017  . 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) 05/16/2015  . Hypothyroidism (acquired) 05/16/2015  . Vitamin B12 deficiency 10/14/2011      09/01/2018, 1:52 PM  [facility] [address] [place], [place], [address] Phone: [phone]   Fax:  [phone]  Name: Zachary Little MRN: [id number] Date of Birth: 08/10/1981   PHYSICAL THERAPY DISCHARGE SUMMARY  Visits from Start of Care: 8  Plan: Patient agrees to discharge.  Patient goals were partially met. Patient is being discharged due to                                                     ?????  See above assessment     , PT, DPT 1:33 PM  09/22/18   

## 2018-09-16 ENCOUNTER — Ambulatory Visit (INDEPENDENT_AMBULATORY_CARE_PROVIDER_SITE_OTHER): Payer: No Typology Code available for payment source | Admitting: Physician Assistant

## 2018-09-16 ENCOUNTER — Other Ambulatory Visit: Payer: Self-pay | Admitting: Physician Assistant

## 2018-09-16 ENCOUNTER — Encounter: Payer: Self-pay | Admitting: Physician Assistant

## 2018-09-16 VITALS — BP 114/62 | HR 90 | Temp 98.1°F | Resp 16 | Wt 198.0 lb

## 2018-09-16 DIAGNOSIS — M2550 Pain in unspecified joint: Secondary | ICD-10-CM | POA: Diagnosis not present

## 2018-09-16 DIAGNOSIS — R768 Other specified abnormal immunological findings in serum: Secondary | ICD-10-CM

## 2018-09-16 DIAGNOSIS — E039 Hypothyroidism, unspecified: Secondary | ICD-10-CM

## 2018-09-16 LAB — CBC WITH DIFFERENTIAL/PLATELET
Basophils Absolute: 0 10*3/uL (ref 0.0–0.1)
Basophils Relative: 0.7 % (ref 0.0–3.0)
EOS PCT: 1.5 % (ref 0.0–5.0)
Eosinophils Absolute: 0.1 10*3/uL (ref 0.0–0.7)
HEMATOCRIT: 48.2 % (ref 39.0–52.0)
HEMOGLOBIN: 16.8 g/dL (ref 13.0–17.0)
Lymphocytes Relative: 31.7 % (ref 12.0–46.0)
Lymphs Abs: 1.9 10*3/uL (ref 0.7–4.0)
MCHC: 34.8 g/dL (ref 30.0–36.0)
MCV: 92.7 fl (ref 78.0–100.0)
MONOS PCT: 12.3 % — AB (ref 3.0–12.0)
Monocytes Absolute: 0.7 10*3/uL (ref 0.1–1.0)
Neutro Abs: 3.2 10*3/uL (ref 1.4–7.7)
Neutrophils Relative %: 53.8 % (ref 43.0–77.0)
Platelets: 186 10*3/uL (ref 150.0–400.0)
RBC: 5.2 Mil/uL (ref 4.22–5.81)
RDW: 12.8 % (ref 11.5–15.5)
WBC: 6 10*3/uL (ref 4.0–10.5)

## 2018-09-16 LAB — TSH: TSH: 1.08 u[IU]/mL (ref 0.35–4.50)

## 2018-09-16 LAB — T4, FREE: Free T4: 1.25 ng/dL (ref 0.60–1.60)

## 2018-09-16 MED ORDER — LEVOTHYROXINE SODIUM 150 MCG PO TABS: 150.0000 ug | ORAL_TABLET | Freq: Every day | ORAL | 1 refills | Status: DC

## 2018-09-16 MED FILL — LEVOTHYROXINE 150 MCG TAB: 150 | 90 days supply | Qty: 90 | Fill #0

## 2018-09-16 NOTE — Progress Notes (Signed)
Zachary Little is a 37 y.o. male here for a new problem.  History of Present Illness:   Chief Complaint  Patient presents with  . Follow-up    HPI   Hypothyroidism -- last TSH was normal 6 months ago. At last check, was maintained on 150 mcg levothyroxine. Denies any: constipation, dry skin, weight changes, palpitations, lack of energy. He is compliant with medication.  Wt Readings from Last 5 Encounters:  09/16/18 198 lb (89.8 kg)  07/02/18 204 lb 3.2 oz (92.6 kg)  06/08/18 199 lb 8 oz (90.5 kg)  04/01/18 209 lb 14.4 oz (95.2 kg)  03/17/18 210 lb 12.8 oz (95.6 kg)   Arthralgia -- he continues to have ongoing L rib/side pain. States that he is concerned that he has a pulled muscle, inflamed nerve, or worst case scenario return of cancer. He denies significant anxiety. He has been seen by multiple providers including: PT, orthopedists, sports medicine provider, neurology. All of his imaging, including MR of thoracic spine, has been normal. Medications tried include: valium, cymbalta, pennsaid, neurontin -- all without relief. Pain does not radiate but he states it is no occurring more frequently.  We had recently put in a referral to rheumatology (April 2019) but he cancelled appointment, stating that he decided to no longer pursue that. He has a history of  Positive ANA on 06/04/18, 1:160 ratio, speckled pattern -- done through [facility].  He is now interested in pursuing work-up for this.  Past Medical History:  Diagnosis Date  . Anxiety   . Blood transfusion without reported diagnosis 2010   During Chemo Treatments  . 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 Thyroid disease      Social History   Socioeconomic History  . Marital status: Single    Spouse name: Not on file  . Number of children: Not on file  . Years of education: Not on file  . Highest education level: Not on file  Occupational History  . Not on file  Social Needs  . Financial resource  strain: Not on file  . Food insecurity:    Worry: Not on file    Inability: Not on file  . Transportation needs:    Medical: Not on file    Non-medical: Not on file  Tobacco Use  . Smoking status: Never Smoker  . Smokeless tobacco: Never Used  Substance and Sexual Activity  . Alcohol use: No  . Drug use: No  . Sexual activity: Yes    Partners: Female  Lifestyle  . Physical activity:    Days per week: Not on file    Minutes per session: Not on file  . Stress: Not on file  Relationships  . Social connections:    Talks on phone: Not on file    Gets together: Not on file    Attends religious service: Not on file    Active member of club or organization: Not on file    Attends meetings of clubs or organizations: Not on file    Relationship status: Not on file  . Intimate partner violence:    Fear of current or ex partner: Not on file    Emotionally abused: Not on file    Physically abused: Not on file    Forced sexual activity: Not on file  Other Topics Concern  . Not on file  Social History Narrative   Lives in [place]   Stay at home    Married in  September, no kids   No pets    No past surgical history on file.  Family History  Problem Relation Age of Onset  . Diabetes Maternal Grandfather   . Diabetes Paternal Grandfather   . Prostate cancer Neg Hx   . Colon cancer Neg Hx     No Known Allergies  Current Medications:   Current Outpatient Medications:  .  levothyroxine (SYNTHROID, LEVOTHROID) 150 MCG tablet, Take 1 tablet (150 mcg total) by mouth daily., Disp: 90 tablet, Rfl: 1   Review of Systems:   ROS  Negative unless otherwise specified per HPI.  Vitals:   Vitals:   09/16/18 1117  BP: 114/62  Pulse: 90  Resp: 16  Temp: 98.1 F (36.7 C)  TempSrc: Oral  SpO2: 98%  Weight: 198 lb (89.8 kg)     Body mass index is 30.11 kg/m.  Physical Exam:   Physical Exam  Constitutional: He appears well-developed. He is cooperative.  Non-toxic  appearance. He does not have a sickly appearance. He does not appear ill. No distress.  Cardiovascular: Normal rate, regular rhythm, S1 normal, S2 normal, normal heart sounds and normal pulses.  No LE edema  Pulmonary/Chest: Effort normal and breath sounds normal.  Pain with deep palpation at the posterior edge of lower L bottom rib; no deformities palpated, no ecchymosis or erythema  Neurological: He is alert. GCS eye subscore is 4. GCS verbal subscore is 5. GCS motor subscore is 6.  Skin: Skin is warm, dry and intact.  Psychiatric: He has a normal mood and affect. His speech is normal and behavior is normal.  Nursing note and vitals reviewed.   Assessment and Plan:    Zachary Little was seen today for follow-up.  Diagnoses and all orders for this visit:  Hypothyroidism (acquired) Re-check today. -     TSH -     T4, free  Arthralgia, unspecified joint; hx positive ANA Refer to rheumatology for further work-up. -     CBC with Differential/Platelet  . Reviewed expectations re: course of current medical issues. . Discussed self-management of symptoms. . Outlined signs and symptoms indicating need for more acute intervention. . Patient verbalized understanding and all questions were answered. . See orders for this visit as documented in the electronic medical record. . Patient received an After-Visit Summary.    [name], PA-C

## 2018-09-16 NOTE — Patient Instructions (Signed)
It was great to see you!  You will be contacted about your rheumatology referral as well as your lab results.   Take care,  [name] PA-C

## 2018-11-02 ENCOUNTER — Encounter: Payer: Self-pay | Admitting: Physician Assistant

## 2018-11-02 ENCOUNTER — Ambulatory Visit (INDEPENDENT_AMBULATORY_CARE_PROVIDER_SITE_OTHER): Payer: No Typology Code available for payment source | Admitting: Physician Assistant

## 2018-11-02 ENCOUNTER — Ambulatory Visit (INDEPENDENT_AMBULATORY_CARE_PROVIDER_SITE_OTHER): Payer: No Typology Code available for payment source

## 2018-11-02 VITALS — BP 110/72 | HR 80 | Temp 97.9°F | Ht 68.0 in | Wt 200.0 lb

## 2018-11-02 DIAGNOSIS — M79675 Pain in left toe(s): Secondary | ICD-10-CM

## 2018-11-02 DIAGNOSIS — Z23 Encounter for immunization: Secondary | ICD-10-CM | POA: Diagnosis not present

## 2018-11-02 NOTE — Patient Instructions (Addendum)
It was great to see you!  We will be in touch as soon as your results are in.  Take care,  [name] PA-C

## 2018-11-02 NOTE — Progress Notes (Signed)
Zachary Little is a 37 y.o. male here for a new problem.  I acted as a Education administrator for [employer], PA-C Anselmo Pickler, LPN  History of Present Illness:   Chief Complaint  Patient presents with  . Pain left pinky toe    Toe Pain   Incident onset: Pt c/o pain left pinky toe, started 2 months ago. The incident occurred at home. The injury mechanism was a direct blow (hit toe again bedpost x 2). Pain location: Left pinky toe. The quality of the pain is described as aching. The pain is at a severity of 6/10. The pain is moderate. The pain has been fluctuating since onset. Associated symptoms include an inability to bear weight. Pertinent negatives include no numbness or tingling. He reports no foreign bodies present. The symptoms are aggravated by movement and weight bearing. He has tried ice for the symptoms. The treatment provided no relief.   He has pain with walking, only after very long periods of time.  Past Medical History:  Diagnosis Date  . Anxiety   . Blood transfusion without reported diagnosis 2010   During Chemo Treatments  . 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 Thyroid disease      Social History   Socioeconomic History  . Marital status: Single    Spouse name: Not on file  . Number of children: Not on file  . Years of education: Not on file  . Highest education level: Not on file  Occupational History  . Not on file  Social Needs  . Financial resource strain: Not on file  . Food insecurity:    Worry: Not on file    Inability: Not on file  . Transportation needs:    Medical: Not on file    Non-medical: Not on file  Tobacco Use  . Smoking status: Never Smoker  . Smokeless tobacco: Never Used  Substance and Sexual Activity  . Alcohol use: No  . Drug use: No  . Sexual activity: Yes    Partners: Female  Lifestyle  . Physical activity:    Days per week: Not on file    Minutes per session: Not on file  . Stress: Not on file  Relationships   . Social connections:    Talks on phone: Not on file    Gets together: Not on file    Attends religious service: Not on file    Active member of club or organization: Not on file    Attends meetings of clubs or organizations: Not on file    Relationship status: Not on file  . Intimate partner violence:    Fear of current or ex partner: Not on file    Emotionally abused: Not on file    Physically abused: Not on file    Forced sexual activity: Not on file  Other Topics Concern  . Not on file  Social History Narrative   Lives in [place]   Stay at home    Married in September, no kids   No pets    History reviewed. No pertinent surgical history.  Family History  Problem Relation Age of Onset  . Diabetes Maternal Grandfather   . Diabetes Paternal Grandfather   . Prostate cancer Neg Hx   . Colon cancer Neg Hx     No Known Allergies  Current Medications:   Current Outpatient Medications:  .  levothyroxine (SYNTHROID, LEVOTHROID) 150 MCG tablet, Take 1 tablet (150 mcg total) by mouth daily., Disp:  90 tablet, Rfl: 1   Review of Systems:   Review of Systems  Neurological: Negative for tingling and numbness.  Negative unless otherwise specified per HPI.   Vitals:   Vitals:   11/02/18 0847  BP: 110/72  Pulse: 80  Temp: 97.9 F (36.6 C)  TempSrc: Oral  SpO2: 97%  Weight: 200 lb (90.7 kg)  Height: 5\' 8"  (1.727 m)     Body mass index is 30.41 kg/m.  Physical Exam:   Physical Exam  Constitutional: He is oriented to person, place, and time. He appears well-developed and well-nourished.  HENT:  Head: Normocephalic.  Eyes: Pupils are equal, round, and reactive to light. Conjunctivae and EOM are normal.  Neck: Normal range of motion.  Pulmonary/Chest: Effort normal.  Musculoskeletal: Normal range of motion.  Pain elicited when left fifth toe is extended and flexed.  No pain with palpation of any part of left fifth toe.  No abnormalities seen, no gross  deformities, no erythema or ecchymosis.  Neurological: He is alert and oriented to person, place, and time.  Skin: Skin is warm and dry.  Psychiatric: He has a normal mood and affect. His behavior is normal. Judgment and thought content normal.  Nursing note and vitals reviewed.  L fifth toe xray: No obvious deformities on my read however awaiting official radiology read  Assessment and Plan:   Isaih was seen today for pain left pinky toe.  Diagnoses and all orders for this visit:  Pain of toe of left foot No red flags on my exam today.  X-ray pending.  Limit activity when symptoms become severe, will make recommendations after official x-ray is back. -     DG Toe 5th Left; Future  Need for prophylactic vaccination and inoculation against influenza -     Flu Vaccine QUAD 36+ mos IM  . Reviewed expectations re: course of current medical issues. . Discussed self-management of symptoms. . Outlined signs and symptoms indicating need for more acute intervention. . Patient verbalized understanding and all questions were answered. . See orders for this visit as documented in the electronic medical record. . Patient received an After-Visit Summary.  CMA or LPN served as scribe during this visit. History, Physical, and Plan performed by medical provider. The above documentation has been reviewed and is accurate and complete.  [name], PA-C

## 2018-11-24 ENCOUNTER — Ambulatory Visit (INDEPENDENT_AMBULATORY_CARE_PROVIDER_SITE_OTHER): Payer: No Typology Code available for payment source | Admitting: Sports Medicine

## 2018-11-24 ENCOUNTER — Encounter: Payer: Self-pay | Admitting: Sports Medicine

## 2018-11-24 VITALS — BP 102/80 | HR 75 | Ht 68.0 in | Wt 206.4 lb

## 2018-11-24 DIAGNOSIS — M24552 Contracture, left hip: Secondary | ICD-10-CM

## 2018-11-24 DIAGNOSIS — M25562 Pain in left knee: Secondary | ICD-10-CM

## 2018-11-24 DIAGNOSIS — M9903 Segmental and somatic dysfunction of lumbar region: Secondary | ICD-10-CM

## 2018-11-24 DIAGNOSIS — M9901 Segmental and somatic dysfunction of cervical region: Secondary | ICD-10-CM

## 2018-11-24 DIAGNOSIS — R29898 Other symptoms and signs involving the musculoskeletal system: Secondary | ICD-10-CM

## 2018-11-24 DIAGNOSIS — M9908 Segmental and somatic dysfunction of rib cage: Secondary | ICD-10-CM

## 2018-11-24 DIAGNOSIS — M546 Pain in thoracic spine: Secondary | ICD-10-CM | POA: Diagnosis not present

## 2018-11-24 DIAGNOSIS — M5414 Radiculopathy, thoracic region: Secondary | ICD-10-CM

## 2018-11-24 DIAGNOSIS — M47814 Spondylosis without myelopathy or radiculopathy, thoracic region: Secondary | ICD-10-CM

## 2018-11-24 DIAGNOSIS — M9905 Segmental and somatic dysfunction of pelvic region: Secondary | ICD-10-CM

## 2018-11-24 DIAGNOSIS — G8929 Other chronic pain: Secondary | ICD-10-CM

## 2018-11-24 DIAGNOSIS — M9902 Segmental and somatic dysfunction of thoracic region: Secondary | ICD-10-CM

## 2018-11-24 NOTE — Progress Notes (Signed)
PROCEDURE NOTE : OSTEOPATHIC MANIPULATION The decision today to treat with Osteopathic Manipulative Therapy (OMT) was based on physical exam findings. Verbal consent was obtained following a discussion with the patient regarding the of risks, benefits and potential side effects, including an acute pain flare,post manipulation soreness and need for repeat treatments.     Contraindications to OMT: NONE  Manipulation was performed as below: [facility] Treated & Osteopathic Exam Findings  C3 FRS right (Flexed, Rotated & Sidebent) C6 FRS left (Flexed, Rotated & Sidebent) T6 - 10 Neutral, Rotated LEFT, Sidebent RIGHT Rib 10 Left  Inhalation dysfunction (depressed/exhaled) L2 FRS right (Flexed, Rotated & Sidebent) L4 FRS left (Flexed, Rotated & Sidebent) Left psoas spasm Left anterior innonimate   OMT Techniques Used  HVLA muscle energy myofascial release soft tissue facilitated positional release    The patient tolerated the treatment well and reported Improved symptoms following treatment today. Patient was given medications, exercises, stretches and lifestyle modifications per AVS and verbally.

## 2018-11-24 NOTE — Patient Instructions (Addendum)

## 2018-11-24 NOTE — Progress Notes (Signed)
Zachary Little. Zachary Little, [facility] [place] - 37 y.o. male MRN [id number]  Date of birth: [birth year]  Visit Date:   PCP: [name], PA   Referred by: [name], [place]   SUBJECTIVE:  Chief Complaint  Patient presents with  . f/u t-spine    MRI T-spine 12/21/17, MRI C-spine 11/26/17, XR C-spine, T-spine, L-spine 11/11/17. PT x 19 visits for C-spine and T-spine pain. Orthotics made 11/10/17.   . Left-sided rib pain    Pain x several years but has worsened over the past month. Aching pain, worse when back isn't straight or when sleeping on side.     HPI: Patient comes in today for persistent and now worsening left-sided lower thoracic and upper lumbar spine pain.  It is worse while sleeping on his side.  It is improved with stretching heat and ice.  He is tried taking ibuprofen and Tylenol with minimal improvements.  He has had physical therapy for his neck and thoracic spine 19 times in the past year and reports some improvement with this but has not been diligent with doing home therapeutic exercises in the past several weeks.  He has noticed that sleeping on a hard bed is beneficial.  Pain is described as a moderate aching.  Exaggerated lumbar lordosis does help alleviate his pain especially while driving.  REVIEW OF SYSTEMS: He does have pain that awakens him at night due to this issue.  He does have a personal history of cancer.  He denies any fevers, chills or night sweats.  No unexplained weight loss.  Otherwise full 12 point review of systems performed is negative.  HISTORY:  Prior history reviewed and updated per electronic medical record.  Social History   Occupational History  . Not on file  Tobacco Use  . Smoking status: Never Smoker  . Smokeless tobacco: Never Used  Substance and Sexual Activity  . Alcohol use: No  . Drug use: No  . Sexual activity: Yes    Partners: Female   Social  History   Social History Narrative   Lives in [place]   Stay at home    Married in September, no kids   No pets    Past Medical History:  Diagnosis Date  . Anxiety   . Blood transfusion without reported diagnosis 2010   During Chemo Treatments  . 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 Thyroid disease    History reviewed. No pertinent surgical history. family history includes Diabetes in his maternal grandfather and paternal grandfather. There is no history of Prostate cancer or Colon cancer.  DATA OBTAINED & REVIEWED:  Recent Labs    01/21/18 1308 03/09/18 0933 09/16/18 1139  CALCIUM  --  9.6  --   AST  --  11  --   ALT  --  15  --   TSH 0.06* 0.67 1.08   Problem  Knee Pain   10/01/2017: 2 view x-ray left knee, normal    No specialty comments available.  OBJECTIVE:  VS:  HT:5\' 8"  (172.7 cm)   WT:206 lb 6.4 oz (93.6 kg)  BMI:31.39    BP:102/80  HR:75bpm  TEMP: ( )  RESP:97 %   PHYSICAL EXAM: Adult male.  No acute distress.  Alert and appropriate.  Has a markedly tight anterior chain in 6 and a forward flexed position from the waist.  He has poor thoracic rotation that is worse  to the left than the right but is diminished bilaterally.  He does have reproduction of the pain with left-sided sidebending and rotation with the thoracic spine.  He has straight leg raise and negative femoral stretch test.  Otherwise findings per osteopathic note..   ASSESSMENT   1. Somatic dysfunction of cervical region   2. Chronic left-sided thoracic back pain   3. Somatic dysfunction of thoracic region   4. Somatic dysfunction of lumbar region   5. Weakness of left hip   6. Somatic dysfunction of pelvis region   7. Somatic dysfunction of rib cage region   8. Thoracic radiculopathy   9. Chronic pain of left knee   10. Left hip flexor tightness   11. Thoracic spondylosis     PLAN:  Pertinent additional documentation may be included in corresponding procedure  notes, imaging studies, problem based documentation and patient instructions.  Procedures:  Osteopathic manipulation was performed today based on physical exam findings.  Please see procedure note for further information including Osteopathic Exam findings  Medications:  No orders of the defined types were placed in this encounter.   Discussion/Instructions: Knee pain Ultimately his left knee pain is likely secondary to tightness and imbalance associated with the left-sided pain he has in general.  He does have tight hip flexors as we have discussed in the past and this was reiterated again today and the recommendation for daily home therapeutic exercises to be continued on an indefinite basis was discussed.  [employer] subscription service discussed as a likely good option for him to be performing on an almost daily basis as well as strengthening exercises and yoga at the gym he has recently joined.  THERAPEUTIC EXERCISE: Discussed the foundation of treatment for this condition is physical therapy and/or daily (5-6 days/week) therapeutic exercises, focusing on core strengthening, coordination, neuromuscular control/reeducation.  Continue previously prescribed home exercise program.  Links to [employer] provided today per Patient Instructions.  These exercises were developed by Minerva Ends, DC with a strong emphasis on core neuromuscular reducation and postural realignment through body-weight exercises. Discussed red flag symptoms that warrant earlier emergent evaluation and patient voices understanding. Activity modifications and the importance of avoiding exacerbating activities (limiting pain to no more than a 4 / 10 during or following activity) recommended and discussed.   At follow up will plan: to consider repeat osteopathic manipulation Return if symptoms worsen or fail to improve.          Gerda Diss, [place] [facility] Physician

## 2018-11-24 NOTE — Assessment & Plan Note (Addendum)
Ultimately his left knee pain is likely secondary to tightness and imbalance associated with the left-sided pain he has in general.  He does have tight hip flexors as we have discussed in the past and this was reiterated again today and the recommendation for daily home therapeutic exercises to be continued on an indefinite basis was discussed.  [employer] subscription service discussed as a likely good option for him to be performing on an almost daily basis as well as strengthening exercises and yoga at the gym he has recently joined.

## 2018-12-18 MED FILL — LEVOTHYROXINE 150 MCG TAB: 150 | 90 days supply | Qty: 90 | Fill #1

## 2019-02-04 ENCOUNTER — Telehealth: Payer: No Typology Code available for payment source | Admitting: Family

## 2019-02-04 DIAGNOSIS — B009 Herpesviral infection, unspecified: Secondary | ICD-10-CM

## 2019-02-04 MED ORDER — VALACYCLOVIR HCL 1 G PO TABS: 2000.0000 mg | ORAL_TABLET | Freq: Two times a day (BID) | ORAL | 0 refills | Status: DC

## 2019-02-04 MED FILL — valACYclovir HCL 1 GM TABS: 1 | 5 days supply | Qty: 20 | Fill #0

## 2019-02-04 NOTE — Progress Notes (Signed)
Greater than 5 minutes, yet less than 10 minutes of time have been spent researching, coordinating, and implementing care for this patient today.  Thank you for the details you included in the comment boxes. Those details are very helpful in determining the best course of treatment for you and help us to provide the best care.  We are sorry that you are not feeling well.  Here is how we plan to help!  Based on what you have shared with me it does look like you have a viral infection.    Most cold sores or fever blisters are small fluid filled blisters around the mouth caused by herpes simplex virus.  The most common strain of the virus causing cold sores is herpes simplex virus 1.  It can be spread by skin contact, sharing eating utensils, or even sharing towels.  Cold sores are contagious to other people until dry. (Approximately 5-7 days).  Wash your hands. You can spread the virus to your eyes through handling your contact lenses after touching the lesions.  Most people experience pain at the sight or tingling sensations in their lips that may begin before the ulcers erupt.  Herpes simplex is treatable but not curable.  It may lie dormant for a long time and then reappear due to stress or prolonged sun exposure.  Many patients have success in treating their cold sores with an over the counter topical called Abreva.  You may apply the cream up to 5 times daily (maximum 10 days) until healing occurs.  If you would like to use an oral antiviral medication to speed the healing of your cold sore, I have sent a prescription to your local pharmacy Valacyclovir 2 gm twice daily for 1 day    HOME CARE:   Wash your hands frequently.  Do not pick at or rub the sore.  Don't open the blisters.  Avoid kissing other people during this time.  Avoid sharing drinking glasses, eating utensils, or razors.  Do not handle contact lenses unless you have thoroughly washed your hands with soap and warm  water!  Avoid oral sex during this time.  Herpes from sores on your mouth can spread to your partner's genital area.  Avoid contact with anyone who has eczema or a weakened immune system.  Cold sores are often triggered by exposure to intense sunlight, use a lip balm containing a sunscreen (SPF 30 or higher).  GET HELP RIGHT AWAY IF:   Blisters look infected.  Blisters occur near or in the eye.  Symptoms last longer than 10 days.  Your symptoms become worse.  MAKE SURE YOU:   Understand these instructions.  Will watch your condition.  Will get help right away if you are not doing well or get worse.    Your e-visit answers were reviewed by a board certified advanced clinical practitioner to complete your personal care plan.  Depending upon the condition, your plan could have  Included both over the counter or prescription medications.    Please review your pharmacy choice.  Be sure that the pharmacy you have chosen is open so that you can pick up your prescription now.  If there is a problem you can message your provider in MyChart to have the prescription routed to another pharmacy.    Your safety is important to us.  If you have drug allergies check our prescription carefully.  For the next 24 hours you can use MyChart to ask questions about today's visit, request   a non-urgent call back, or ask for a work or school excuse from your e-visit provider.  You will get an email in the next two days asking about your experience.  I hope that your e-visit has been valuable and will speed your recovery.  

## 2019-02-19 MED FILL — LEVOTHYROXINE 150 MCG TAB: 150 | 90 days supply | Qty: 90 | Fill #0

## 2019-04-27 ENCOUNTER — Encounter: Payer: Self-pay | Admitting: Physician Assistant

## 2019-04-28 ENCOUNTER — Other Ambulatory Visit: Payer: Self-pay | Admitting: Physician Assistant

## 2019-04-28 DIAGNOSIS — M79675 Pain in left toe(s): Secondary | ICD-10-CM

## 2019-05-03 MED FILL — MELOXICAM 15 MG TABLET: 15 | 30 days supply | Qty: 30 | Fill #0

## 2019-06-02 ENCOUNTER — Ambulatory Visit (INDEPENDENT_AMBULATORY_CARE_PROVIDER_SITE_OTHER): Payer: No Typology Code available for payment source | Admitting: Physician Assistant

## 2019-06-02 ENCOUNTER — Other Ambulatory Visit: Payer: Self-pay

## 2019-06-02 ENCOUNTER — Encounter: Payer: Self-pay | Admitting: Physician Assistant

## 2019-06-02 VITALS — BP 102/70 | HR 63 | Temp 98.0°F | Ht 68.0 in | Wt 192.0 lb

## 2019-06-02 DIAGNOSIS — G5711 Meralgia paresthetica, right lower limb: Secondary | ICD-10-CM

## 2019-06-02 DIAGNOSIS — R42 Dizziness and giddiness: Secondary | ICD-10-CM

## 2019-06-02 DIAGNOSIS — C9201 Acute myeloblastic leukemia, in remission: Secondary | ICD-10-CM | POA: Diagnosis not present

## 2019-06-02 DIAGNOSIS — N492 Inflammatory disorders of scrotum: Secondary | ICD-10-CM | POA: Diagnosis not present

## 2019-06-02 DIAGNOSIS — E039 Hypothyroidism, unspecified: Secondary | ICD-10-CM | POA: Diagnosis not present

## 2019-06-02 DIAGNOSIS — R5383 Other fatigue: Secondary | ICD-10-CM | POA: Diagnosis not present

## 2019-06-02 LAB — CBC WITH DIFFERENTIAL/PLATELET
Basophils Absolute: 0 10*3/uL (ref 0.0–0.1)
Basophils Relative: 0.8 % (ref 0.0–3.0)
Eosinophils Absolute: 0.2 10*3/uL (ref 0.0–0.7)
Eosinophils Relative: 3.2 % (ref 0.0–5.0)
HCT: 47.9 % (ref 39.0–52.0)
Hemoglobin: 16.2 g/dL (ref 13.0–17.0)
Lymphocytes Relative: 31.3 % (ref 12.0–46.0)
Lymphs Abs: 1.6 10*3/uL (ref 0.7–4.0)
MCHC: 34 g/dL (ref 30.0–36.0)
MCV: 93.5 fl (ref 78.0–100.0)
Monocytes Absolute: 0.6 10*3/uL (ref 0.1–1.0)
Monocytes Relative: 12.3 % — ABNORMAL HIGH (ref 3.0–12.0)
Neutro Abs: 2.7 10*3/uL (ref 1.4–7.7)
Neutrophils Relative %: 52.4 % (ref 43.0–77.0)
Platelets: 174 10*3/uL (ref 150.0–400.0)
RBC: 5.12 Mil/uL (ref 4.22–5.81)
RDW: 13.3 % (ref 11.5–15.5)
WBC: 5.1 10*3/uL (ref 4.0–10.5)

## 2019-06-02 LAB — COMPREHENSIVE METABOLIC PANEL
ALT: 19 U/L (ref 0–53)
AST: 15 U/L (ref 0–37)
Albumin: 4.5 g/dL (ref 3.5–5.2)
Alkaline Phosphatase: 70 U/L (ref 39–117)
BUN: 18 mg/dL (ref 6–23)
CO2: 27 mEq/L (ref 19–32)
Calcium: 9.3 mg/dL (ref 8.4–10.5)
Chloride: 103 mEq/L (ref 96–112)
Creatinine, Ser: 0.94 mg/dL (ref 0.40–1.50)
GFR: 90.28 mL/min (ref >60.00)
Glucose, Bld: 93 mg/dL (ref 70–99)
Potassium: 4.2 mEq/L (ref 3.5–5.1)
Sodium: 138 mEq/L (ref 135–145)
Total Bilirubin: 0.8 mg/dL (ref 0.2–1.2)
Total Protein: 6.7 g/dL (ref 6.0–8.3)

## 2019-06-02 LAB — T4, FREE: Free T4: 1.22 ng/dL (ref 0.60–1.60)

## 2019-06-02 LAB — TSH: TSH: 0.47 u[IU]/mL (ref 0.35–4.50)

## 2019-06-02 NOTE — Patient Instructions (Signed)
It was great to see you!  I will be in touch with your labs and soon as they return.  Order for ultrasound of scrotum and new neurology referral placed. Someone will be in touch regarding scheduling these appointments.  Continue to push fluids and eat regularly throughout the day, including a good source of protein.  Take care,  [name] PA-C

## 2019-06-02 NOTE — Progress Notes (Signed)
Zachary Little is a 38 y.o. male here for a new problem.  I acted as a Education administrator for [employer], PA-C Anselmo Pickler, LPN  History of Present Illness:   Chief Complaint  Patient presents with  . Hypothyroidism  . Fatigue    HPI   Hypothyroidism Pt following up today on thyroid issue. last TSH was normal 8 months ago. At last check, was maintained on 150 mcg levothyroxine. Denies any: constipation, dry skin, weight changes, palpitations. He is compliant with medication. He is c/o fatigue for the past 10 days, and lightheadedness when he stands up. Denies blurred vision, chest pain, SOB, unusual headaches, weakness on one side of the body. He has intentionally lost 14 lb with exercise and eating better. He is tracking calories, eating about 1700 calories a day. 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  Scrotum nodule Has cyst on L scrotum. Has been present for at least 10 years, was seen at one point by a urologist but he was never given a diagnosis. It has very slowly increased with time. Denies tenderness. Was previously evaluated by me on 06/08/18 and declined work-up at that time. He is now interested in u/s.  Meralgia paresthetica of R side Has been dealing with this for about 3 years. Frustrated that it hasn't improved with intentional weight loss. Would like referral to neurologist for second opinion.   Past Medical History:  Diagnosis Date  . Anxiety   . Blood transfusion without reported diagnosis 2010   During Chemo Treatments  . 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 Thyroid disease      Social History   Socioeconomic History  . Marital status: Single    Spouse name: Not on file  . Number of children: Not on file  . Years of education: Not on file  . Highest education level: Not on file  Occupational History  . Not on file  Social Needs  . Financial resource strain: Not on file  . Food insecurity    Worry: Not on file    Inability: Not on file  .  Transportation needs    Medical: Not on file    Non-medical: Not on file  Tobacco Use  . Smoking status: Never Smoker  . Smokeless tobacco: Never Used  Substance and Sexual Activity  . Alcohol use: No  . Drug use: No  . Sexual activity: Yes    Partners: Female  Lifestyle  . Physical activity    Days per week: Not on file    Minutes per session: Not on file  . Stress: Not on file  Relationships  . Social Herbalist on phone: Not on file    Gets together: Not on file    Attends religious service: Not on file    Active member of club or organization: Not on file    Attends meetings of clubs or organizations: Not on file    Relationship status: Not on file  . Intimate partner violence    Fear of current or ex partner: Not on file    Emotionally abused: Not on file    Physically abused: Not on file    Forced sexual activity: Not on file  Other Topics Concern  . Not on file  Social History Narrative   Lives in [place]   Stay at home    Married in September, no kids   No pets    History reviewed. No pertinent surgical history.  Family History  Problem Relation Age of Onset  . Diabetes Maternal Grandfather   . Diabetes Paternal Grandfather   . Prostate cancer Neg Hx   . Colon cancer Neg Hx     No Known Allergies  Current Medications:   Current Outpatient Medications:  .  levothyroxine (SYNTHROID, LEVOTHROID) 150 MCG tablet, Take 1 tablet (150 mcg total) by mouth daily., Disp: 90 tablet, Rfl: 1   Review of Systems:   ROS Negative unless otherwise specified per HPI.  Vitals:   Vitals:   06/02/19 0759  BP: 102/70  Pulse: 63  Temp: 98 F (36.7 C)  TempSrc: Oral  SpO2: 97%  Weight: 192 lb (87.1 kg)  Height: 5\' 8"  (1.727 m)     Body mass index is 29.19 kg/m.  Orthostatic VS for the past 24 hrs (Last 3 readings):  BP- Lying Pulse- Lying BP- Sitting Pulse- Sitting BP- Standing at 0 minutes Pulse- Standing at 0 minutes  06/02/19 0825 110/80 61  110/78 64 108/80 66    Physical Exam:   Physical Exam Vitals signs and nursing note reviewed.  Constitutional:      General: He is not in acute distress.    Appearance: He is well-developed. He is not ill-appearing or toxic-appearing.  Cardiovascular:     Rate and Rhythm: Normal rate and regular rhythm.     Pulses: Normal pulses.     Heart sounds: Normal heart sounds, S1 normal and S2 normal.     Comments: No LE edema Pulmonary:     Effort: Pulmonary effort is normal.     Breath sounds: Normal breath sounds.  Skin:    General: Skin is warm and dry.  Neurological:     General: No focal deficit present.     Mental Status: He is alert.     GCS: GCS eye subscore is 4. GCS verbal subscore is 5. GCS motor subscore is 6.     Cranial Nerves: Cranial nerves are intact.     Sensory: Sensation is intact.     Motor: Motor function is intact.     Coordination: Coordination is intact.  Psychiatric:        Speech: Speech normal.        Behavior: Behavior normal. Behavior is cooperative.      Assessment and Plan:   Bradlee was seen today for hypothyroidism and fatigue.  Diagnoses and all orders for this visit:  Fatigue, unspecified type; Hypothyroidism (acquired); Postural lightheadedness; AML (acute myeloid leukemia) in remission ([place]) Unclear etiology. Did ask him to review any concerns for sleep apnea with wife and to let us know. May need to consider adding in electrolyte replacement throughout the day, increase water and add in more protein. Mood stable per patient. Will check labs, given health hx of AML and hypoTSH. Orthostatics, neuro and cardiac exam negative. Did offer EKG but he declined. -     Comprehensive metabolic panel -     TSH -     T4, free -     CBC with Differential/Platelet -     Pathologist smear review  Nodule of scrotum U/S ordered. -     US Scrotum; Future  Meralgia paresthetica of right side Neurology referral ordered. -     Ambulatory referral to  Neurology  . Reviewed expectations re: course of current medical issues. . Discussed self-management of symptoms. . Outlined signs and symptoms indicating need for more acute intervention. . Patient verbalized understanding and all questions were answered. . See orders for this  visit as documented in the electronic medical record. . Patient received an After-Visit Summary.  CMA or LPN served as scribe during this visit. History, Physical, and Plan performed by medical provider. The above documentation has been reviewed and is accurate and complete.   [name], PA-C

## 2019-06-03 LAB — PATHOLOGIST SMEAR REVIEW

## 2019-06-18 ENCOUNTER — Other Ambulatory Visit: Payer: Self-pay | Admitting: Physician Assistant

## 2019-06-18 DIAGNOSIS — N492 Inflammatory disorders of scrotum: Secondary | ICD-10-CM

## 2019-06-18 MED FILL — LEVOTHYROXINE 150 MCG TAB: 150 | 90 days supply | Qty: 90 | Fill #0

## 2019-06-23 ENCOUNTER — Other Ambulatory Visit: Payer: Self-pay | Admitting: Physician Assistant

## 2019-06-23 DIAGNOSIS — N492 Inflammatory disorders of scrotum: Secondary | ICD-10-CM

## 2019-06-25 ENCOUNTER — Ambulatory Visit
Admission: RE | Admit: 2019-06-25 | Discharge: 2019-06-25 | Disposition: A | Discharge status: Home / Self Care | Payer: No Typology Code available for payment source | Source: Ambulatory Visit | Attending: Physician Assistant | Admitting: Physician Assistant

## 2019-06-25 DIAGNOSIS — N492 Inflammatory disorders of scrotum: Secondary | ICD-10-CM

## 2019-06-28 ENCOUNTER — Other Ambulatory Visit: Payer: Self-pay | Admitting: Physician Assistant

## 2019-06-28 ENCOUNTER — Encounter: Payer: Self-pay | Admitting: Physician Assistant

## 2019-06-28 DIAGNOSIS — N503 Cyst of epididymis: Secondary | ICD-10-CM

## 2019-06-29 MED FILL — TRIAMCINOLONE 0.1% CREAM: 0.1 | 25 days supply | Qty: 80 | Fill #0

## 2019-07-15 ENCOUNTER — Other Ambulatory Visit: Payer: Self-pay

## 2019-07-15 ENCOUNTER — Ambulatory Visit (INDEPENDENT_AMBULATORY_CARE_PROVIDER_SITE_OTHER): Payer: No Typology Code available for payment source | Admitting: Neurology

## 2019-07-15 ENCOUNTER — Encounter: Payer: Self-pay | Admitting: Neurology

## 2019-07-15 VITALS — BP 102/69 | HR 62 | Temp 96.6°F | Ht 68.0 in | Wt 188.0 lb

## 2019-07-15 DIAGNOSIS — G5711 Meralgia paresthetica, right lower limb: Secondary | ICD-10-CM

## 2019-07-15 MED ORDER — NORTRIPTYLINE HCL 25 MG PO CAPS: 50.0000 mg | ORAL_CAPSULE | Freq: Every day | ORAL | 11 refills | Status: DC

## 2019-07-15 MED FILL — NORTRIPTYLINE HCL 25 MG CAP: 25 | 30 days supply | Qty: 60 | Fill #0

## 2019-07-15 NOTE — Progress Notes (Signed)
PATIENT: Zachary Little DOB: 01/26/1981  Chief Complaint  Patient presents with  . Right Side Meralgia    Reports numbness/burning in right thigh, worse when lying in the bed at night or with prolonged standing.  Also, discomfort in his bilateral sides (at waist level) when lying down.   Marland Kitchen PCP    [name], PA     HISTORICAL  Zachary Little is a 38 year old male, seen in request by his primary care PA [name], for evaluation of right lateral thigh numbness, initial evaluation was July 15, 2019.  I have reviewed and summarized the referring note from the referring physician.  He had a history of acute myeloid leukemia, status post chemotherapy in remission,  In 2017, he noticed intermittent numbness tingling at the right lateral thigh, most noticeable when he lies on his back, with leg external rotation, getting better if he rotated his thigh internally, over the years, he was seen by different neurologists including Dr. Forest Gleason, was diagnosed with right-sided meralgia paresthetica, I personally reviewed MRI of lumbar in March 2017 that was normal, MRI cervical spine January 2019 showed no significant abnormality, MRI of thoracic spine in January 2019 that was normal  He was treated with different medications, gabapentin, Lyrica, without significant benefit,   REVIEW OF SYSTEMS: Full 14 system review of systems performed and notable only for as above All other review of systems were negative.  ALLERGIES: No Known Allergies  HOME MEDICATIONS: Current Outpatient Medications  Medication Sig Dispense Refill  . levothyroxine (SYNTHROID) 150 MCG tablet TAKE 1 TABLET BY MOUTH DAILY. 90 tablet 1   No current facility-administered medications for this visit.     PAST MEDICAL HISTORY: Past Medical History:  Diagnosis Date  . Anxiety   . Blood transfusion without reported diagnosis 2010   During Chemo Treatments  . Leukemia, acute myeloid, in remission  ([place]) 2010  . Thyroid disease     PAST SURGICAL HISTORY: Past Surgical History:  Procedure Laterality Date  . PORT-A-CATH REMOVAL      FAMILY HISTORY: Family History  Problem Relation Age of Onset  . Healthy Mother   . Healthy Father   . Multiple sclerosis Maternal Grandmother   . Diabetes Maternal Grandfather   . Diabetes Paternal Grandfather   . Prostate cancer Neg Hx   . Colon cancer Neg Hx     SOCIAL HISTORY: Social History   Socioeconomic History  . Marital status: Single    Spouse name: Not on file  . Number of children: 0  . Years of education: college  . Highest education level: Not on file  Occupational History  . Occupation: Disabled  Social Needs  . Financial resource strain: Not on file  . Food insecurity    Worry: Not on file    Inability: Not on file  . Transportation needs    Medical: Not on file    Non-medical: Not on file  Tobacco Use  . Smoking status: Never Smoker  . Smokeless tobacco: Never Used  Substance and Sexual Activity  . Alcohol use: Yes    Comment: "maybe one drink every two years"  . Drug use: No  . Sexual activity: Yes    Partners: Female  Lifestyle  . Physical activity    Days per week: Not on file    Minutes per session: Not on file  . Stress: Not on file  Relationships  . Social connections    Talks on phone: Not on file  Gets together: Not on file    Attends religious service: Not on file    Active member of club or organization: Not on file    Attends meetings of clubs or organizations: Not on file    Relationship status: Not on file  . Intimate partner violence    Fear of current or ex partner: Not on file    Emotionally abused: Not on file    Physically abused: Not on file    Forced sexual activity: Not on file  Other Topics Concern  . Not on file  Social History Narrative   Lives at home with his wife.   Left-handed.   No daily caffeine use.      PHYSICAL EXAM   Vitals:   07/15/19 0803  BP:  102/69  Pulse: 62  Temp: (!) 96.6 F (35.9 C)  Weight: 188 lb (85.3 kg)  Height: 5\' 8"  (1.727 m)    Not recorded      Body mass index is 28.59 kg/m.  PHYSICAL EXAMNIATION:  Gen: NAD, conversant, well nourised, obese, well groomed                     Cardiovascular: Regular rate rhythm, no peripheral edema, warm, nontender. Eyes: Conjunctivae clear without exudates or hemorrhage Neck: Supple, no carotid bruits. Pulmonary: Clear to auscultation bilaterally   NEUROLOGICAL EXAM:  MENTAL STATUS: Speech:    Speech is normal; fluent and spontaneous with normal comprehension.  Cognition:     Orientation to time, place and person     Normal recent and remote memory     Normal Attention span and concentration     Normal Language, naming, repeating,spontaneous speech     Fund of knowledge   CRANIAL NERVES: CN II: Visual fields are full to confrontation pupils are round equal and briskly reactive to light. CN III, IV, VI: extraocular movement are normal. No ptosis. CN V: Facial sensation is intact to pinprick in all 3 divisions bilaterally. Corneal responses are intact.  CN VII: Face is symmetric with normal eye closure and smile. CN VIII: Hearing is normal to rubbing fingers CN IX, X: Palate elevates symmetrically. Phonation is normal. CN XI: Head turning and shoulder shrug are intact CN XII: Tongue is midline with normal movements and no atrophy.  MOTOR: There is no pronator drift of out-stretched arms. Muscle bulk and tone are normal. Muscle strength is normal.  REFLEXES: Reflexes are 2+ and symmetric at the biceps, triceps, knees, and ankles. Plantar responses are flexor.  SENSORY: Intact to light touch, pinprick, positional sensation and vibratory sensation are intact in fingers and toes.  With exception of mildly decreased light touch right lateral thigh COORDINATION: Rapid alternating movements and fine finger movements are intact. There is no dysmetria on  finger-to-nose and heel-knee-shin.    GAIT/STANCE: Posture is normal. Gait is steady with normal steps, base, arm swing, and turning. Heel and toe walking are normal. Tandem gait is normal.  Romberg is absent.   DIAGNOSTIC DATA (LABS, IMAGING, TESTING) - I reviewed patient records, labs, notes, testing and imaging myself where available.   ASSESSMENT AND PLAN  Davius Goudeau is a 38 y.o. male   Right meralgia paresthetica  Try nortriptyline 25 mg titrating to 50 mg daily  Avoid tight outfit, prolonged sitting  Marcial Pacas, M.D. Ph.D.  [facility] [address] [place], [place] [address] Ph: [phone] Fax: [phone]  CC: [name], [place]

## 2019-08-12 ENCOUNTER — Telehealth: Payer: Self-pay | Admitting: *Deleted

## 2019-08-12 DIAGNOSIS — M549 Dorsalgia, unspecified: Secondary | ICD-10-CM

## 2019-08-12 NOTE — Telephone Encounter (Signed)
Discuss with [facility] tomorrow morning.

## 2019-08-12 NOTE — Telephone Encounter (Signed)
Spoke to pt told him saw you were on the schedule tomorrow for back and wrist pain. Aldona Bar thought it be best for you to see Orthopedic for your back and wrist issue since we have seen you before for back pain issues and she would be sending you there to be treated. Pt verbalized understanding and said he was not sure if he should see Ortho due to last time he saw one they did not do anything. Pt asked if he could see a Sport Medicine provider. Told pt Aldona Bar is gone for the day but I will check with her first thing in the morning and then we can place the referral for him. Pt verbalized understanding.

## 2019-08-13 ENCOUNTER — Ambulatory Visit: Payer: No Typology Code available for payment source | Admitting: Physician Assistant

## 2019-08-13 NOTE — Addendum Note (Signed)
Addended by: Marian Sorrow on: 08/13/2019 07:52 AM   Modules accepted: Orders

## 2019-08-13 NOTE — Telephone Encounter (Signed)
Discussed pt with Aldona Bar okay to send to Dr. Junius Road[facility].    Called pt and told him we will be placing a referral for Sports Medicine with Dr. Junius Roads for him. Someone will contact you to schedule an appointment. Pt verbalized understanding. Referral placed.

## 2019-08-17 ENCOUNTER — Encounter: Payer: Self-pay | Admitting: Family Medicine

## 2019-08-17 ENCOUNTER — Ambulatory Visit (INDEPENDENT_AMBULATORY_CARE_PROVIDER_SITE_OTHER): Payer: No Typology Code available for payment source | Admitting: Family Medicine

## 2019-08-17 DIAGNOSIS — M542 Cervicalgia: Secondary | ICD-10-CM

## 2019-08-17 DIAGNOSIS — G8929 Other chronic pain: Secondary | ICD-10-CM

## 2019-08-17 DIAGNOSIS — M546 Pain in thoracic spine: Secondary | ICD-10-CM | POA: Diagnosis not present

## 2019-08-17 DIAGNOSIS — E559 Vitamin D deficiency, unspecified: Secondary | ICD-10-CM | POA: Diagnosis not present

## 2019-08-17 DIAGNOSIS — M25511 Pain in right shoulder: Secondary | ICD-10-CM

## 2019-08-17 MED ORDER — TIZANIDINE HCL 2 MG PO TABS: 2.0000 mg | ORAL_TABLET | Freq: Four times a day (QID) | ORAL | 1 refills | Status: DC | PRN

## 2019-08-17 MED ORDER — VITAMIN D-3 125 MCG (5000 UT) PO TABS: 1.0000 | ORAL_TABLET | Freq: Every day | ORAL | 3 refills | Status: DC

## 2019-08-17 MED FILL — tiZANidine HCL 2 MG TABS: 2 | 8 days supply | Qty: 60 | Fill #0

## 2019-08-17 NOTE — Progress Notes (Signed)
I saw and examined the patient with Dr. Mayer Masker and agree with assessment and plan as outlined.  Myofascial pain in the right rhomboid area.  Neurologic exam is nonfocal, Spurling's test is negative.  Also a tender area in the left lateral rib cage.  Etiology is uncertain.  I reviewed his previous cervical and thoracic MRI scans which were unrevealing.  He does have a history of vitamin D deficiency not currently treated.  We will treat vitamin D, also refer him to Dr. Owens Shark for chiropractic treatment in [place].  He will call me to let me know how he is doing.

## 2019-08-17 NOTE — Progress Notes (Signed)
Zachary Little - 38 y.o. male MRN [id number]  Date of birth: [birth year]  Office Visit Note: Visit Date: 08/17/2019 PCP: [name], PA Referred by: [name], [place]  Subjective: Chief Complaint  Patient presents with  . Middle Back - Pain    Pain in left side/rib   Right upper back pain/tightness   HPI: Zachary Little is a 38 y.o. male who comes in today with right upper back pain and left side pain for the past 1.5 years.  Upper back pain- worse when walking long distances or after jogging. He jogs 3-4 miles, 3-4 x a week.   Left side- Worse when lying on side at night - has been present 1.5 years. Has had MRI of spine and neck which were unrevealing.   Went to PT at [facility] 1 year ago. Had brief improvement in pain with exercises and dry needling.   No numbness/tingling down arms or legs. No shooting pains. No weakness.   ROS Otherwise per HPI.  Assessment & Plan: Visit Diagnoses:  1. Chronic right shoulder pain   2. Pain in thoracic spine   3. Vitamin D deficiency   4. Neck pain      Plan:  - will trial chiropractor - low vitamin D levels- will start vitamin D   Meds & Orders:  Meds ordered this encounter  Medications  . Cholecalciferol (VITAMIN D-3) 125 MCG (5000 UT) TABS    Sig: Take 1 tablet by mouth daily.    Dispense:  90 tablet    Refill:  3  . tiZANidine (ZANAFLEX) 2 MG tablet    Sig: Take 1-2 tablets (2-4 mg total) by mouth every 6 (six) hours as needed for muscle spasms.    Dispense:  60 tablet    Refill:  1    Orders Placed This Encounter  Procedures  . Ambulatory referral to Chiropractic    Follow-up: No follow-ups on file.   Procedures: No procedures performed  No notes on file   Clinical History: No specialty comments available.   He reports that he has never smoked. He has never used smokeless tobacco. No results for input(s): HGBA1C, LABURIC in the last 8760 hours.  Objective:  VS:  HT:    WT:   BMI:      BP:   HR: bpm  TEMP: ( )  RESP:  Physical Exam  PHYSICAL EXAM: Gen: NAD, alert, cooperative with exam, well-appearing HEENT: clear conjunctiva,  CV:  no edema, capillary refill brisk, normal rate Resp: non-labored Skin: no rashes, normal turgor  Neuro: no gross deficits.  Psych:  alert and oriented  Ortho Exam  Neck/Back: - Inspection: no gross deformity or asymmetry, swelling or ecchymosis - Palpation: No TTP spinous process, TTP along right rhomboid and trapezius with pressure point at mid scapula level - ROM: full active ROM of the cervical spine with neck extension, rotation, flexion  - Strength: 5/5 wrist flexion, extension, biceps flexion, triceps extension. OK sign, interosseus strength intact  - Neuro: sensation intact in the C5-C8 nerve root distribution b/l, 2+ C5-C7 reflexes - Special testing: pain in upper latissimus dorsi with spurling's maneuver  Imaging: No results found.  Past Medical/Family/Surgical/Social History: Medications & Allergies reviewed per EMR, new medications updated. Patient Active Problem List   Diagnosis Date Noted  . Depression, major, single episode, complete remission ([place]) 03/09/2018  . Erectile dysfunction 03/09/2018  . Obesity 03/09/2018  . Abnormal gait 01/02/2018  . 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 Osteoarthritis of spine 11/26/2017  . Knee pain 10/31/2017  . Thoracic spondylosis 10/28/2017  . Thoracic radiculopathy 05/23/2017  . Meralgia paresthetica of right side 05/23/2017  . AML (acute myeloid leukemia) in remission ([place]) 05/16/2015  . Hypothyroidism (acquired) 05/16/2015  . Vitamin B12 deficiency 10/14/2011   Past Medical History:  Diagnosis Date  . Anxiety   . Blood transfusion without reported diagnosis 2010   During Chemo Treatments  . 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 Thyroid disease    Family History  Problem Relation Age of Onset  . Healthy Mother   . Healthy Father   .  Multiple sclerosis Maternal Grandmother   . Diabetes Maternal Grandfather   . Diabetes Paternal Grandfather   . Prostate cancer Neg Hx   . Colon cancer Neg Hx    Past Surgical History:  Procedure Laterality Date  . PORT-A-CATH REMOVAL     Social History   Occupational History  . Occupation: Disabled  Tobacco Use  . Smoking status: Never Smoker  . Smokeless tobacco: Never Used  Substance and Sexual Activity  . Alcohol use: Yes    Comment: "maybe one drink every two years"  . Drug use: No  . Sexual activity: Yes    Partners: Female

## 2019-09-16 ENCOUNTER — Other Ambulatory Visit: Payer: Self-pay | Admitting: Physician Assistant

## 2019-09-16 MED FILL — LEVOTHYROXINE 150 MCG TAB: 150 | 90 days supply | Qty: 90 | Fill #0

## 2019-10-06 ENCOUNTER — Other Ambulatory Visit: Payer: Self-pay

## 2019-10-06 ENCOUNTER — Encounter: Payer: Self-pay | Admitting: Family Medicine

## 2019-10-06 ENCOUNTER — Ambulatory Visit (INDEPENDENT_AMBULATORY_CARE_PROVIDER_SITE_OTHER): Payer: No Typology Code available for payment source | Admitting: Family Medicine

## 2019-10-06 DIAGNOSIS — G2589 Other specified extrapyramidal and movement disorders: Secondary | ICD-10-CM

## 2019-10-06 DIAGNOSIS — S76112A Strain of left quadriceps muscle, fascia and tendon, initial encounter: Secondary | ICD-10-CM | POA: Insufficient documentation

## 2019-10-06 DIAGNOSIS — S43431S Superior glenoid labrum lesion of right shoulder, sequela: Secondary | ICD-10-CM | POA: Insufficient documentation

## 2019-10-06 DIAGNOSIS — G8929 Other chronic pain: Secondary | ICD-10-CM | POA: Insufficient documentation

## 2019-10-06 MED ORDER — TIZANIDINE HCL 4 MG PO CAPS: ORAL_CAPSULE | ORAL | 0 refills | Status: DC

## 2019-10-06 MED FILL — tiZANidine HCL 4 MG TABS: 4 | 30 days supply | Qty: 30 | Fill #0

## 2019-10-06 NOTE — Progress Notes (Signed)
[facility] [address], [place] [address] Phone: [phone] Subjective:   [name], am serving as a scribe for Zachary Little.  I'm seeing this patient by the request  of:  Zachary Little, [place]   CC: Upper back pain, left leg pain  [id number]  Zachary Little is a 38 y.o. male coming in with complaint of upper back and neck pain with rotation and with cervical flexion and extension. Patient use to wake up in the morning with a stiff neck. Patient stretches daily. Pain is exacerbated by running. Patient has been running 3x a week. Denies any radiating symptoms.   Patient is also having left quad pain. Patient has lost 40# over the past year or two. Is able to run despite the pain but has soreness afterwards that he has to try to massage out. Running outside makes his pain worse than treadmill running.     Past Medical History:  Diagnosis Date  . Anxiety   . Blood transfusion without reported diagnosis 2010   During Chemo Treatments  . 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 Thyroid disease    Past Surgical History:  Procedure Laterality Date  . PORT-A-CATH REMOVAL     Social History   Socioeconomic History  . Marital status: Single    Spouse name: Not on file  . Number of children: 0  . Years of education: college  . Highest education level: Not on file  Occupational History  . Occupation: Disabled  Social Needs  . Financial resource strain: Not on file  . Food insecurity    Worry: Not on file    Inability: Not on file  . Transportation needs    Medical: Not on file    Non-medical: Not on file  Tobacco Use  . Smoking status: Never Smoker  . Smokeless tobacco: Never Used  Substance and Sexual Activity  . Alcohol use: Yes    Comment: "maybe one drink every two years"  . Drug use: No  . Sexual activity: Yes    Partners: Female  Lifestyle  . Physical activity    Days per week: Not on file   Minutes per session: Not on file  . Stress: Not on file  Relationships  . Social Herbalist on phone: Not on file    Gets together: Not on file    Attends religious service: Not on file    Active member of club or organization: Not on file    Attends meetings of clubs or organizations: Not on file    Relationship status: Not on file  Other Topics Concern  . Not on file  Social History Narrative   Lives at home with his wife.   Left-handed.   No daily caffeine use.    No Known Allergies Family History  Problem Relation Age of Onset  . Healthy Mother   . Healthy Father   . Multiple sclerosis Maternal Grandmother   . Diabetes Maternal Grandfather   . Diabetes Paternal Grandfather   . Prostate cancer Neg Hx   . Colon cancer Neg Hx     Current Outpatient Medications (Endocrine & Metabolic):  .  levothyroxine (SYNTHROID) 150 MCG tablet, TAKE 1 TABLET BY MOUTH DAILY.      Current Outpatient Medications (Other):  Marland Kitchen  Cholecalciferol (VITAMIN D-3) 125 MCG (5000 UT) TABS, Take 1 tablet by mouth daily. Marland Kitchen  tiZANidine (ZANAFLEX) 2 MG tablet, Take 1-2  tablets (2-4 mg total) by mouth every 6 (six) hours as needed for muscle spasms. .  nortriptyline (PAMELOR) 25 MG capsule, Take 2 capsules (50 mg total) by mouth at bedtime. Marland Kitchen  tiZANidine (ZANAFLEX) 4 MG capsule, 1 tablet at night    Past medical history, social, surgical and family history all reviewed in electronic medical record.  No pertanent information unless stated regarding to the chief complaint.   Review of Systems:  No headache, visual changes, nausea, vomiting, diarrhea, constipation, dizziness, abdominal pain, skin rash, fevers, chills, night sweats, weight loss, swollen lymph nodes, body aches, joint swelling, muscle aches, chest pain, shortness of breath, mood changes.   Objective  Blood pressure 110/70, pulse 85, height 5\' 8"  (1.727 m), weight 186 lb (84.4 kg), SpO2 98 %.   General: No apparent distress  alert and oriented x3 mood and affect normal, dressed appropriately.  HEENT: Pupils equal, extraocular movements intact  Respiratory: Patient's speak in full sentences and does not appear short of breath  Cardiovascular: No lower extremity edema, non tender, no erythema  Skin: Warm dry intact with no signs of infection or rash on extremities or on axial skeleton.  Abdomen: Soft nontender  Neuro: Cranial nerves II through XII are intact, neurovascularly intact in all extremities with 2+ DTRs and 2+ pulses.  Lymph: No lymphadenopathy of posterior or anterior cervical chain or axillae bilaterally.  Gait normal with good balance and coordination.  MSK:  Non tender with full range of motion and good stability and symmetric strength and tone of , elbows, wrist, hip, and ankles bilaterally.  Patient does have right-sided scapular dyskinesis noted with some mild winging of the right scapula.  Patient does have some tenderness to palpation in the parascapular region.  Left leg has some mild tenderness to the quadricep but no mass palpated.  Patient does have tender to palpation in this area.  Mild pain with resisted extension of the knee in the quadricep.  Worsening pain with external rotation and elevation against source with pain near the sartorius.  [id number]; 15 additional minutes spent for Therapeutic exercises as stated in above notes.  This included exercises focusing on stretching, strengthening, with significant focus on eccentric aspects.   Long term goals include an improvement in range of motion, strength, endurance as well as avoiding reinjury. Patient's frequency would include in 1-2 times a day, 3-5 times a week for a duration of 6-12 weeks.  Shoulder Exercises that included:  Basic scapular stabilization to include adduction and depression of scapula Scaption, focusing on proper movement and good control Internal and External rotation utilizing a theraband, with elbow tucked at side entire time  Rows with theraband which was given   Proper technique shown and discussed handout in great detail with ATC.  All questions were discussed and answered.     Impression and Recommendations:     This case required medical decision making of moderate complexity. The above documentation has been reviewed and is accurate and complete Lyndal Pulley, DO       Note: This dictation was prepared with Dragon dictation along with smaller phrase technology. Any transcriptional errors that result from this process are unintentional.

## 2019-10-06 NOTE — Assessment & Plan Note (Signed)
Quadricep strain on the left side.  Discussed with patient.  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  Follow-up with me again in 4 to 6 weeks.  If no improvement consider physical therapy

## 2019-10-06 NOTE — Patient Instructions (Addendum)
Scapular and quad exercises Thigh compression sleeve Vitamin D 2000 IU daily  Turmeric 500mg  daily  Coop pillow zanaflex at night See me again in 4-5 weeks

## 2019-10-06 NOTE — Assessment & Plan Note (Signed)
Scapular dyskinesis.  Discussed with patient in great length.  Discussed posture and ergonomics, patient declined osteopathic manipulation, discussed and declined formal physical therapy.  Muscle relaxer prescribed today and see if this will respond.  Discussed icing regimen.  Patient will follow-up again in 4 to 6 weeks.

## 2019-10-17 NOTE — Progress Notes (Signed)
PATIENT: Zachary Little DOB: [birth year]  REASON FOR VISIT: follow up HISTORY FROM: patient  HISTORY OF PRESENT ILLNESS: Today 10/18/19  HISTORY  Zachary Little is a 38 year old male, seen in request by his primary care PA [name], for evaluation of right lateral thigh numbness, initial evaluation was July 15, 2019.  I have reviewed and summarized the referring note from the referring physician.  He had a history of acute myeloid leukemia, status post chemotherapy in remission,  In 2017, he noticed intermittent numbness tingling at the right lateral thigh, most noticeable when he lies on his back, with leg external rotation, getting better if he rotated his thigh internally, over the years, he was seen by different neurologists including Dr. Forest Gleason, was diagnosed with right-sided meralgia paresthetica, I personally reviewed MRI of lumbar in March 2017 that was normal, MRI cervical spine January 2019 showed no significant abnormality, MRI of thoracic spine in January 2019 that was normal  He was treated with different medications, gabapentin, Lyrica, without significant benefit,  Update October 18, 2019 SS: He reports continued pain/numbness/tingling to his right lateral thigh, worsened when standing, or lying down.  The nortriptyline was not helpful, made him feel anxious.  At night, he is able to relieve the pain by repositioning a pillow under his thighs.  He indicates the pain has been consistent since 2017, has not worsened.  The only relief he is able to obtain is positional.  He currently is not employed.  He denies any changes to his bowels or bladder.  He is not diabetic, does not wear tight waistband or belt, he exercises and is active.  He says overall in regards to life significance of this problem about 3 or 4/10.  He is not interested in pills.  He would like to discuss options for injection or surgery, if nothing else, would at least like to have  consultation. It isn't so much pain, but numbness.   REVIEW OF SYSTEMS: Out of a complete 14 system review of symptoms, the patient complains only of the following symptoms, and all other reviewed systems are negative.  Numbness  ALLERGIES: No Known Allergies  HOME MEDICATIONS: Outpatient Medications Prior to Visit  Medication Sig Dispense Refill  . Cholecalciferol (VITAMIN D-3) 125 MCG (5000 UT) TABS Take 1 tablet by mouth daily. 90 tablet 3  . levothyroxine (SYNTHROID) 150 MCG tablet TAKE 1 TABLET BY MOUTH DAILY. 90 tablet 1  . tiZANidine (ZANAFLEX) 2 MG tablet Take 1-2 tablets (2-4 mg total) by mouth every 6 (six) hours as needed for muscle spasms. 60 tablet 1  . nortriptyline (PAMELOR) 25 MG capsule Take 2 capsules (50 mg total) by mouth at bedtime. 60 capsule 11  . tiZANidine (ZANAFLEX) 4 MG capsule 1 tablet at night 30 capsule 0   No facility-administered medications prior to visit.     PAST MEDICAL HISTORY: Past Medical History:  Diagnosis Date  . Anxiety   . Blood transfusion without reported diagnosis 2010   During Chemo Treatments  . 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 Thyroid disease     PAST SURGICAL HISTORY: Past Surgical History:  Procedure Laterality Date  . PORT-A-CATH REMOVAL      FAMILY HISTORY: Family History  Problem Relation Age of Onset  . Healthy Mother   . Healthy Father   . Multiple sclerosis Maternal Grandmother   . Diabetes Maternal Grandfather   . Diabetes Paternal Grandfather   . Prostate cancer Neg Hx   .  Colon cancer Neg Hx     SOCIAL HISTORY: Social History   Socioeconomic History  . Marital status: Single    Spouse name: Not on file  . Number of children: 0  . Years of education: college  . Highest education level: Not on file  Occupational History  . Occupation: Disabled  Social Needs  . Financial resource strain: Not on file  . Food insecurity    Worry: Not on file    Inability: Not on file  .  Transportation needs    Medical: Not on file    Non-medical: Not on file  Tobacco Use  . Smoking status: Never Smoker  . Smokeless tobacco: Never Used  Substance and Sexual Activity  . Alcohol use: Yes    Comment: "maybe one drink every two years"  . Drug use: No  . Sexual activity: Yes    Partners: Female  Lifestyle  . Physical activity    Days per week: Not on file    Minutes per session: Not on file  . Stress: Not on file  Relationships  . Social Herbalist on phone: Not on file    Gets together: Not on file    Attends religious service: Not on file    Active member of club or organization: Not on file    Attends meetings of clubs or organizations: Not on file    Relationship status: Not on file  . Intimate partner violence    Fear of current or ex partner: Not on file    Emotionally abused: Not on file    Physically abused: Not on file    Forced sexual activity: Not on file  Other Topics Concern  . Not on file  Social History Narrative   Lives at home with his wife.   Left-handed.   No daily caffeine use.       PHYSICAL EXAM  Vitals:   10/18/19 0758  BP: 99/68  Pulse: 77  Temp: (!) 97 F (36.1 C)  Weight: 188 lb (85.3 kg)  Height: 5\' 8"  (1.727 m)   Body mass index is 28.59 kg/m.  Generalized: Well developed, in no acute distress   Neurological examination  Mentation: Alert oriented to time, place, history taking. Follows all commands speech and language fluent Cranial nerve II-XII: Pupils were equal round reactive to light. Extraocular movements were full, visual field were full on confrontational test. Facial sensation and strength were normal.  Head turning and shoulder shrug were normal and symmetric. Motor: The motor testing reveals 5 over 5 strength of all 4 extremities. Good symmetric motor tone is noted throughout.  Sensory: Sensory testing is intact to soft touch on all 4 extremities. No evidence of extinction is noted.  Coordination:  Cerebellar testing reveals good finger-nose-finger and heel-to-shin bilaterally.  Gait and station: Gait is normal. Tandem gait is normal. Romberg is negative. No drift is seen.  Reflexes: Deep tendon reflexes are symmetric and normal bilaterally.   DIAGNOSTIC DATA (LABS, IMAGING, TESTING) - I reviewed patient records, labs, notes, testing and imaging myself where available.  Lab Results  Component Value Date   WBC 5.1 06/02/2019   HGB 16.2 06/02/2019   HCT 47.9 06/02/2019   MCV 93.5 06/02/2019   PLT 174.0 06/02/2019      Component Value Date/Time   NA 138 06/02/2019 0834   NA 145 12/02/2016   K 4.2 06/02/2019 0834   CL 103 06/02/2019 0834   CO2 27 06/02/2019 0834  GLUCOSE 93 06/02/2019 0834   BUN 18 06/02/2019 0834   BUN 15 12/02/2016   CREATININE 0.94 06/02/2019 0834   CALCIUM 9.3 06/02/2019 0834   PROT 6.7 06/02/2019 0834   ALBUMIN 4.5 06/02/2019 0834   AST 15 06/02/2019 0834   ALT 19 06/02/2019 0834   ALKPHOS 70 06/02/2019 0834   BILITOT 0.8 06/02/2019 0834   Lab Results  Component Value Date   CHOL 127 03/09/2018   HDL 40.40 03/09/2018   LDLCALC 59 03/09/2018   TRIG 139.0 03/09/2018   CHOLHDL 3 03/09/2018   No results found for: HGBA1C Lab Results  Component Value Date   VITAMINB12 434 03/09/2018   Lab Results  Component Value Date   TSH 0.47 06/02/2019   ASSESSMENT AND PLAN 38 y.o. year old male  has a past medical history of Anxiety, Blood transfusion without reported diagnosis (2010), Leukemia, acute myeloid, in remission ([facility]) (2010), and Thyroid disease. here with:  1.  Right meralgia paresthetica -Continue chronic problems since 2017, has remained stable, exacerbated by lying down, or standing, described as numbness -Has not benefited from nortriptyline, Lyrica, Cymbalta, or gabapentin -MRI of lumbar spine in March 2017 was normal, MRI cervical spine January 2019 showed no significant abnormality, MRI of thoracic spine in January 2019 was normal  -He has previously been evaluated by Dr. Tomi Likens, no structural cause was identified on imaging, not suggestive of lumbar radiculopathy, treatment has been symptomatic as there has been no correctable cause found on imaging -He is interested in injection for his discomfort, will refer to pain clinic, [facility] in [place] -He will follow-up in 6 months or sooner if needed  I spent 15 minutes with the patient. 50% of this time was spent to this plan of care.  Butler Denmark, AGNP-C, DNP 10/18/2019, 8:07 AM [facility] [address], [place], [place] [address] [phone]

## 2019-10-18 ENCOUNTER — Encounter: Payer: Self-pay | Admitting: Neurology

## 2019-10-18 ENCOUNTER — Other Ambulatory Visit: Payer: Self-pay | Admitting: *Deleted

## 2019-10-18 ENCOUNTER — Other Ambulatory Visit: Payer: Self-pay

## 2019-10-18 ENCOUNTER — Ambulatory Visit: Payer: No Typology Code available for payment source | Admitting: Neurology

## 2019-10-18 VITALS — BP 99/68 | HR 77 | Temp 97.0°F | Ht 68.0 in | Wt 188.0 lb

## 2019-10-18 DIAGNOSIS — G5711 Meralgia paresthetica, right lower limb: Secondary | ICD-10-CM

## 2019-10-18 NOTE — Progress Notes (Signed)
Reordered per pts insurance network [facility].

## 2019-10-18 NOTE — Patient Instructions (Signed)
I will determine options available for you in regards to nerve block and let you know.

## 2019-11-16 ENCOUNTER — Ambulatory Visit: Payer: No Typology Code available for payment source | Admitting: Family Medicine

## 2019-11-16 ENCOUNTER — Other Ambulatory Visit: Payer: Self-pay

## 2019-11-16 ENCOUNTER — Encounter: Payer: Self-pay | Admitting: Family Medicine

## 2019-11-16 VITALS — BP 110/78 | HR 80 | Ht 68.0 in | Wt 189.0 lb

## 2019-11-16 DIAGNOSIS — G2589 Other specified extrapyramidal and movement disorders: Secondary | ICD-10-CM

## 2019-11-16 DIAGNOSIS — M999 Biomechanical lesion, unspecified: Secondary | ICD-10-CM | POA: Diagnosis not present

## 2019-11-16 DIAGNOSIS — M2550 Pain in unspecified joint: Secondary | ICD-10-CM | POA: Diagnosis not present

## 2019-11-16 DIAGNOSIS — M25511 Pain in right shoulder: Secondary | ICD-10-CM | POA: Insufficient documentation

## 2019-11-16 LAB — SEDIMENTATION RATE: Sed Rate: 2 mm/hr (ref 0–15)

## 2019-11-16 LAB — IBC PANEL
Iron: 142 ug/dL (ref 42–165)
Saturation Ratios: 49.5 % (ref 20.0–50.0)
Transferrin: 205 mg/dL — ABNORMAL LOW (ref 212.0–360.0)

## 2019-11-16 LAB — FERRITIN: Ferritin: 594.3 ng/mL — ABNORMAL HIGH (ref 22.0–322.0)

## 2019-11-16 NOTE — Assessment & Plan Note (Signed)
Attempted home exercises, which activities to do which wants to avoid.  Discussed posture and ergonomics, discussed avoiding certain activities.  Patient is to increase activity slowly.  Patient will get some laboratory work-up to make sure nothing else is contributing.  Follow-up with me again in 4 to 6 weeks

## 2019-11-16 NOTE — Progress Notes (Signed)
[facility] [address] [place], [place] [address] Phone: [phone] Subjective:   I Kandace Blitz am serving as a Education administrator for Dr. Hulan Saas.  This visit occurred during the SARS-CoV-2 public health emergency.  Safety protocols were in place, including screening questions prior to the visit, additional usage of staff PPE, and extensive cleaning of exam room while observing appropriate contact time as indicated for disinfecting solutions.    CC: Back pain, thigh pain  [id number]  Zachary Little is a 38 y.o. male coming in with complaint of neck and thigh pain. Patient states he feels about the same. Has tried the exercises.  Patient continues up on a chronic basis.  Patient unfortunately does not feel that anything has improved too much at the moment.  States that he can start the day and feels like his neck is relatively well and then seems to get much worse again.    Past Medical History:  Diagnosis Date  . Anxiety   . Blood transfusion without reported diagnosis 2010   During Chemo Treatments  . Leukemia, acute myeloid, in remission ([facility]) 2010  . Thyroid disease    Past Surgical History:  Procedure Laterality Date  . PORT-A-CATH REMOVAL     Social History   Socioeconomic History  . Marital status: Single    Spouse name: Not on file  . Number of children: 0  . Years of education: college  . Highest education level: Not on file  Occupational History  . Occupation: Disabled  Tobacco Use  . Smoking status: Never Smoker  . Smokeless tobacco: Never Used  Substance and Sexual Activity  . Alcohol use: Yes    Comment: "maybe one drink every two years"  . Drug use: No  . Sexual activity: Yes    Partners: Female  Other Topics Concern  . Not on file  Social History Narrative   Lives at home with his wife.   Left-handed.   No daily caffeine use.    Social Determinants of Health   Financial Resource Strain:   . Difficulty of  Paying Living Expenses: Not on file  Food Insecurity:   . Worried About Charity fundraiser in the Last Year: Not on file  . Ran Out of Food in the Last Year: Not on file  Transportation Needs:   . Lack of Transportation (Medical): Not on file  . Lack of Transportation (Non-Medical): Not on file  Physical Activity:   . Days of Exercise per Week: Not on file  . Minutes of Exercise per Session: Not on file  Stress:   . Feeling of Stress : Not on file  Social Connections:   . Frequency of Communication with Friends and Family: Not on file  . Frequency of Social Gatherings with Friends and Family: Not on file  . Attends Religious Services: Not on file  . Active Member of Clubs or Organizations: Not on file  . Attends Archivist Meetings: Not on file  . Marital Status: Not on file   No Known Allergies Family History  Problem Relation Age of Onset  . Healthy Mother   . Healthy Father   . Multiple sclerosis Maternal Grandmother   . Diabetes Maternal Grandfather   . Diabetes Paternal Grandfather   . Prostate cancer Neg Hx   . Colon cancer Neg Hx     Current Outpatient Medications (Endocrine & Metabolic):  .  levothyroxine (SYNTHROID) 150 MCG tablet, TAKE 1 TABLET BY  MOUTH DAILY.      Current Outpatient Medications (Other):  Marland Kitchen  Cholecalciferol (VITAMIN D-3) 125 MCG (5000 UT) TABS, Take 1 tablet by mouth daily. Marland Kitchen  tiZANidine (ZANAFLEX) 2 MG tablet, Take 1-2 tablets (2-4 mg total) by mouth every 6 (six) hours as needed for muscle spasms.    Past medical history, social, surgical and family history all reviewed in electronic medical record.  No pertanent information unless stated regarding to the chief complaint.   Review of Systems:  No headache, visual changes, nausea, vomiting, diarrhea, constipation, dizziness, abdominal pain, skin rash, fevers, chills, night sweats, weight loss, swollen lymph nodes, body aches, joint swelling, muscle aches, chest pain, shortness of  breath, mood changes.   Objective  There were no vitals taken for this visit. Systems examined below as of    General: No apparent distress alert and oriented x3 mood and affect normal, dressed appropriately.  HEENT: Pupils equal, extraocular movements intact  Respiratory: Patient's speak in full sentences and does not appear short of breath  Cardiovascular: No lower extremity edema, non tender, no erythema  Skin: Warm dry intact with no signs of infection or rash on extremities or on axial skeleton.  Abdomen: Soft nontender  Neuro: Cranial nerves II through XII are intact, neurovascularly intact in all extremities with 2+ DTRs and 2+ pulses.  Lymph: No lymphadenopathy of posterior or anterior cervical chain or axillae bilaterally.  Gait normal with good balance and coordination.  MSK:  Non tender with full range of motion and good stability and symmetric strength and tone of shoulders, elbows, wrist, hip, knee and ankles bilaterally.  Back exam shows the patient does have some tenderness noted. Neck exam does show some tightness in the parascapular region right greater than left.  Multiple trigger points noted in the area mostly in the rhomboid, trapezius, and scalenes.  Negative Spurling's of the neck noted.  5 out of 5 strength in the upper extremity in the lower extremities.  Osteopathic findings C2 flexed rotated and side bent right C4 flexed rotated and side bent left C6 flexed rotated and side bent left T3 extended rotated and side bent right inhaled third rib  After verbal consent patient was prepped with alcohol swabs and with a 25-gauge half inch needle injected in 3 distinct trigger points in 3 distinct muscles of the right shoulder region.  Total of 3 cc of 0.5% Marcaine was used in the rhomboid, trapezius, and scalene.  1 cc of Kenalog 40 mg/mL used.  No blood loss.  Band-Aid placed.  Postinjection instructions given.  Impression and Recommendations:     This case required  medical decision making of moderate complexity. The above documentation has been reviewed and is accurate and complete Lyndal Pulley, DO       Note: This dictation was prepared with Dragon dictation along with smaller phrase technology. Any transcriptional errors that result from this process are unintentional.

## 2019-11-16 NOTE — Patient Instructions (Addendum)
Good to see you See me again in 4-6 weeks 

## 2019-11-16 NOTE — Assessment & Plan Note (Signed)
Decision today to treat with OMT was based on Physical Exam  After verbal consent patient was treated with HVLA, ME, FPR techniques in cervical, thoracic, rib areas  Patient tolerated the procedure well with improvement in symptoms  Patient given exercises, stretches and lifestyle modifications  See medications in patient instructions if given  Patient will follow up in 4-6 weeks 

## 2019-11-16 NOTE — Assessment & Plan Note (Signed)
Injection given today, tolerated the procedure well, discussed which activities to do which wants to avoid.  Discussed home exercises and icing regimen, follow-up again in 4 to 8 weeks

## 2019-11-17 ENCOUNTER — Other Ambulatory Visit: Payer: Self-pay

## 2019-11-17 ENCOUNTER — Other Ambulatory Visit (INDEPENDENT_AMBULATORY_CARE_PROVIDER_SITE_OTHER): Payer: No Typology Code available for payment source

## 2019-11-17 DIAGNOSIS — M2550 Pain in unspecified joint: Secondary | ICD-10-CM

## 2019-11-17 LAB — COMPREHENSIVE METABOLIC PANEL
ALT: 22 U/L (ref 0–53)
AST: 17 U/L (ref 0–37)
Albumin: 4.5 g/dL (ref 3.5–5.2)
Alkaline Phosphatase: 61 U/L (ref 39–117)
BUN: 18 mg/dL (ref 6–23)
CO2: 24 mEq/L (ref 19–32)
Calcium: 9.7 mg/dL (ref 8.4–10.5)
Chloride: 103 mEq/L (ref 96–112)
Creatinine, Ser: 0.89 mg/dL (ref 0.40–1.50)
GFR: 95.92 mL/min (ref >60.00)
Glucose, Bld: 67 mg/dL — ABNORMAL LOW (ref 70–99)
Potassium: 4.3 mEq/L (ref 3.5–5.1)
Sodium: 142 mEq/L (ref 135–145)
Total Bilirubin: 0.6 mg/dL (ref 0.2–1.2)
Total Protein: 6.7 g/dL (ref 6.0–8.3)

## 2019-11-17 LAB — CBC WITH DIFFERENTIAL/PLATELET
Basophils Absolute: 0 10*3/uL (ref 0.0–0.1)
Basophils Relative: 0.9 % (ref 0.0–3.0)
Eosinophils Absolute: 0.2 10*3/uL (ref 0.0–0.7)
Eosinophils Relative: 3 % (ref 0.0–5.0)
HCT: 48.2 % (ref 39.0–52.0)
Hemoglobin: 16.1 g/dL (ref 13.0–17.0)
Lymphocytes Relative: 33 % (ref 12.0–46.0)
Lymphs Abs: 1.6 10*3/uL (ref 0.7–4.0)
MCHC: 33.5 g/dL (ref 30.0–36.0)
MCV: 94.7 fl (ref 78.0–100.0)
Monocytes Absolute: 0.6 10*3/uL (ref 0.1–1.0)
Monocytes Relative: 12.1 % — ABNORMAL HIGH (ref 3.0–12.0)
Neutro Abs: 2.5 10*3/uL (ref 1.4–7.7)
Neutrophils Relative %: 51 % (ref 43.0–77.0)
Platelets: 183 10*3/uL (ref 150.0–400.0)
RBC: 5.09 Mil/uL (ref 4.22–5.81)
RDW: 13.5 % (ref 11.5–15.5)
WBC: 4.9 10*3/uL (ref 4.0–10.5)

## 2019-11-18 LAB — TESTOSTERONE, FREE, TOTAL, SHBG
Sex Hormone Binding: 28.9 nmol/L (ref 16.5–55.9)
Testosterone, Free: 19 pg/mL (ref 8.7–25.1)
Testosterone: 382 ng/dL (ref 264–916)

## 2019-11-24 LAB — VITAMIN D 1,25 DIHYDROXY
Vitamin D 1, 25 (OH)2 Total: 69 pg/mL (ref 18–72)
Vitamin D2 1, 25 (OH)2: <8 pg/mL
Vitamin D3 1, 25 (OH)2: 69 pg/mL

## 2019-12-06 ENCOUNTER — Other Ambulatory Visit (INDEPENDENT_AMBULATORY_CARE_PROVIDER_SITE_OTHER): Payer: No Typology Code available for payment source

## 2019-12-06 ENCOUNTER — Other Ambulatory Visit: Payer: Self-pay

## 2019-12-06 DIAGNOSIS — M2550 Pain in unspecified joint: Secondary | ICD-10-CM

## 2019-12-07 LAB — FERRITIN: Ferritin: 481.1 ng/mL — ABNORMAL HIGH (ref 22.0–322.0)

## 2019-12-09 MED FILL — LEVOTHYROXINE SODIUM 150 MC: 150 | 90 days supply | Qty: 90 | Fill #1

## 2019-12-15 ENCOUNTER — Encounter: Payer: Self-pay | Admitting: Family Medicine

## 2019-12-15 ENCOUNTER — Ambulatory Visit: Payer: No Typology Code available for payment source | Admitting: Family Medicine

## 2019-12-15 ENCOUNTER — Ambulatory Visit (INDEPENDENT_AMBULATORY_CARE_PROVIDER_SITE_OTHER): Payer: No Typology Code available for payment source

## 2019-12-15 ENCOUNTER — Other Ambulatory Visit: Payer: Self-pay

## 2019-12-15 VITALS — BP 104/70 | HR 76 | Ht 68.0 in | Wt 187.0 lb

## 2019-12-15 DIAGNOSIS — M25552 Pain in left hip: Secondary | ICD-10-CM

## 2019-12-15 DIAGNOSIS — S76112D Strain of left quadriceps muscle, fascia and tendon, subsequent encounter: Secondary | ICD-10-CM

## 2019-12-15 DIAGNOSIS — M999 Biomechanical lesion, unspecified: Secondary | ICD-10-CM

## 2019-12-15 DIAGNOSIS — M47814 Spondylosis without myelopathy or radiculopathy, thoracic region: Secondary | ICD-10-CM | POA: Diagnosis not present

## 2019-12-15 DIAGNOSIS — S76112A Strain of left quadriceps muscle, fascia and tendon, initial encounter: Secondary | ICD-10-CM

## 2019-12-15 DIAGNOSIS — M542 Cervicalgia: Secondary | ICD-10-CM

## 2019-12-15 NOTE — Assessment & Plan Note (Signed)
Overall relatively well.  Discussed with patient that more like him to start with formal physical therapy for this and scapular dyskinesis.  Attempted osteopathic manipulation with minimal benefit again.  Discussed icing regimen.  Discussed ergonomics throughout the day that make a big difference.  Follow-up with me again in 6 weeks

## 2019-12-15 NOTE — Progress Notes (Signed)
[facility] Phone: [phone] Subjective:   [name], am serving as a scribe for Dr. Hulan Saas. This visit occurred during the SARS-CoV-2 public health emergency.  Safety protocols were in place, including screening questions prior to the visit, additional usage of staff PPE, and extensive cleaning of exam room while observing appropriate contact time as indicated for disinfecting solutions.   I'm seeing this patient by the request  of:  [name], [place]  CC: Neck pain, leg pain  [id number]  Zachary Little is a 39 y.o. male coming in with complaint of neck pain. Last seen on 11/16/2019 for OMT. Patient states that he is having neck tightness with rotation. Has been doing HEP but feels that the exercises do not help. Feels the best in the morning.   States that he has had quad pain since November 2020. Insidious onset. Pain occurs with standing, sitting and after exercising.     Past Medical History:  Diagnosis Date  . Anxiety   . Blood transfusion without reported diagnosis 2010   During Chemo Treatments  . 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 Thyroid disease    Past Surgical History:  Procedure Laterality Date  . PORT-A-CATH REMOVAL     Social History   Socioeconomic History  . Marital status: Single    Spouse name: Not on file  . Number of children: 0  . Years of education: college  . Highest education level: Not on file  Occupational History  . Occupation: Disabled  Tobacco Use  . Smoking status: Never Smoker  . Smokeless tobacco: Never Used  Substance and Sexual Activity  . Alcohol use: Yes    Comment: "maybe one drink every two years"  . Drug use: No  . Sexual activity: Yes    Partners: Female  Other Topics Concern  . Not on file  Social History Narrative   Lives at home with his wife.   Left-handed.   No daily caffeine use.    Social Determinants of  Health   Financial Resource Strain:   . Difficulty of Paying Living Expenses: Not on file  Food Insecurity:   . Worried About Charity fundraiser in the Last Year: Not on file  . Ran Out of Food in the Last Year: Not on file  Transportation Needs:   . Lack of Transportation (Medical): Not on file  . Lack of Transportation (Non-Medical): Not on file  Physical Activity:   . Days of Exercise per Week: Not on file  . Minutes of Exercise per Session: Not on file  Stress:   . Feeling of Stress : Not on file  Social Connections:   . Frequency of Communication with Friends and Family: Not on file  . Frequency of Social Gatherings with Friends and Family: Not on file  . Attends Religious Services: Not on file  . Active Member of Clubs or Organizations: Not on file  . Attends Archivist Meetings: Not on file  . Marital Status: Not on file   No Known Allergies Family History  Problem Relation Age of Onset  . Healthy Mother   . Healthy Father   . Multiple sclerosis Maternal Grandmother   . Diabetes Maternal Grandfather   . Diabetes Paternal Grandfather   . Prostate cancer Neg Hx   . Colon cancer Neg Hx     Current Outpatient Medications (Endocrine & Metabolic):  .  levothyroxine (  SYNTHROID) 150 MCG tablet, TAKE 1 TABLET BY MOUTH DAILY.      Current Outpatient Medications (Other):  Marland Kitchen  Cholecalciferol (VITAMIN D-3) 125 MCG (5000 UT) TABS, Take 1 tablet by mouth daily. Marland Kitchen  tiZANidine (ZANAFLEX) 2 MG tablet, Take 1-2 tablets (2-4 mg total) by mouth every 6 (six) hours as needed for muscle spasms.    Past medical history, social, surgical and family history all reviewed in electronic medical record.  No pertanent information unless stated regarding to the chief complaint.   Review of Systems:  No headache, visual changes, nausea, vomiting, diarrhea, constipation, dizziness, abdominal pain, skin rash, fevers, chills, night sweats, weight loss, swollen lymph nodes, body  aches, joint swelling, chest pain, shortness of breath, mood changes. POSITIVE muscle aches  Objective  Blood pressure 104/70, pulse 76, height 5\' 8"  (1.727 m), weight 187 lb (84.8 kg), SpO2 99 %.   General: No apparent distress alert and oriented x3 mood and affect normal, dressed appropriately.  HEENT: Pupils equal, extraocular movements intact  Respiratory: Patient's speak in full sentences and does not appear short of breath  Cardiovascular: No lower extremity edema, non tender, no erythema  Skin: Warm dry intact with no signs of infection or rash on extremities or on axial skeleton.  Abdomen: Soft nontender  Neuro: Cranial nerves II through XII are intact, neurovascularly intact in all extremities with 2+ DTRs and 2+ pulses.  Lymph: No lymphadenopathy of posterior or anterior cervical chain or axillae bilaterally.  Gait normal with good balance and coordination.  MSK:  tender with full range of motion and good stability and symmetric strength and tone of shoulders, elbows, wrist, hip and ankles bilaterally.  Neck exam does have some loss of lordosis.  Tender to palpation still in the scapular region bilaterally.  Patient still has some scapular dyskinesis noted.  Out of 5 strength of the upper extremities. Left quad does have some mild tenderness to palpation.  No bogginess no masses appreciated.  Mild pain with resisted extension but no pain with resisted flexion of the knee.  No pain with resisted flexion of the hip  Osteopathic findings C2 flexed rotated and side bent right C6 flexed rotated and side bent left T3 extended rotated and side bent right inhaled third rib T5 extended rotated and side bent left     Impression and Recommendations:     This case required medical decision making of moderate complexity. The above documentation has been reviewed and is accurate and complete Lyndal Pulley, DO       Note: This dictation was prepared with Dragon dictation along with  smaller phrase technology. Any transcriptional errors that result from this process are unintentional.

## 2019-12-15 NOTE — Assessment & Plan Note (Signed)
Potential quadricep strain.  Patient has some mild discomfort with resisted extension of the knee.  Patient has good range of motion but will get x-ray of the hip to make sure there is no impingement that is contributing.  Has muscle relaxers if needed.  Discussed thigh compression sleeve.

## 2019-12-15 NOTE — Patient Instructions (Signed)
PT HPC neck and quad strain Thigh compression sleeve Recovery sandals in house HOKA or OOFOS See me in 6 weeks

## 2019-12-15 NOTE — Assessment & Plan Note (Signed)
Decision today to treat with OMT was based on Physical Exam  After verbal consent patient was treated with HVLA, ME, FPR techniques in cervical, rib, thoracic, rib, areas  Patient tolerated the procedure well with improvement in symptoms  Patient given exercises, stretches and lifestyle modifications  See medications in patient instructions if given  Patient will follow up in 4-8 weeks

## 2019-12-22 ENCOUNTER — Ambulatory Visit: Payer: No Typology Code available for payment source | Admitting: Physical Therapy

## 2019-12-22 ENCOUNTER — Encounter: Payer: Self-pay | Admitting: Physical Therapy

## 2019-12-22 ENCOUNTER — Other Ambulatory Visit: Payer: Self-pay

## 2019-12-22 DIAGNOSIS — M542 Cervicalgia: Secondary | ICD-10-CM

## 2019-12-22 DIAGNOSIS — M79652 Pain in left thigh: Secondary | ICD-10-CM | POA: Diagnosis not present

## 2019-12-22 DIAGNOSIS — M62838 Other muscle spasm: Secondary | ICD-10-CM | POA: Diagnosis not present

## 2019-12-22 NOTE — Patient Instructions (Signed)
Access Code: [email or web address]  URL: [email or web address]  Date: 12/22/2019  Prepared by: Lyndee Hensen   Exercises Supine Single Knee to Chest - 3 reps - 30 hold - 2x daily Prone Quadriceps Stretch with Strap - 3 reps - 30 hold - 2x daily Seated Hamstring Stretch - 3 reps - 30 hold - 2x daily

## 2019-12-22 NOTE — Therapy (Signed)
[facility] [address] [place], [place], [address] Phone: [phone]   Fax:  [phone]  Physical Therapy Evaluation  Patient Details  Name: Zachary Little MRN: [id number] Date of Birth: 1981-09-22 Referring Provider (PT): Charlann Boxer   Encounter Date: 12/22/2019  PT End of Session - 12/22/19 1040    Visit Number  1    Number of Visits  12    Date for PT Re-Evaluation  02/02/20    Authorization Type  [facility]    PT Start Time  0936    PT Stop Time  1013    PT Time Calculation (min)  37 min    Activity Tolerance  Patient tolerated treatment well    Behavior During Therapy  [facility] for tasks assessed/performed       Past Medical History:  Diagnosis Date  . Anxiety   . Blood transfusion without reported diagnosis 2010   During Chemo Treatments  . 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 Thyroid disease     Past Surgical History:  Procedure Laterality Date  . PORT-A-CATH REMOVAL      There were no vitals filed for this visit.   Subjective Assessment - 12/22/19 0939    Subjective  Pt states pain in L quad and R side of neck. He has been increasing activity level in last several months, thinks quad started getting sore with increased walking and running. Most pain with increased activity, but also with sitting in chair for prolonged periods with work. Works from home. He also has R sided neck pain, mostly muscular into R rhomboid region. Increased with L and R cervical rotation, also states pain into R deltoid region at end range for rotation.    Limitations  Sitting;Lifting;House hold activities    Patient Stated Goals  Decreased pain,  return to running.    Currently in Pain?  Yes    Pain Score  4     Pain Location  Leg    Pain Orientation  Left    Pain Descriptors / Indicators  Tightness    Pain Type  Acute pain    Pain Radiating Towards  medial knee    Pain Onset  More than a month ago    Pain Frequency   Intermittent    Aggravating Factors   sitting in work chair, walking, exercising.    Multiple Pain Sites  Yes    Pain Score  4    Pain Location  Neck    Pain Orientation  Right    Pain Descriptors / Indicators  Tightness;Aching    Pain Radiating Towards  into R deltoid.    Pain Onset  More than a month ago    Pain Frequency  Intermittent    Aggravating Factors   L and R rotation         [facility] PT Assessment - 12/22/19 0001      Assessment   Medical Diagnosis  R Thigh pain, Neck Pain    Referring Provider (PT)  Charlann Boxer    Hand Dominance  Left    Prior Therapy  yes, few visits for neck      Balance Screen   Has the patient fallen in the past 6 months  No      Prior Function   Level of Independence  Independent      Cognition   Overall Cognitive Status  Within Functional Limits for tasks assessed      Posture/Postural  Control   Posture Comments  Flexible posture, rounded shoulders.       ROM / Strength   AROM / PROM / Strength  AROM;Strength      AROM   Overall AROM Comments  UE: WNL, LE: mild limitation for hip flexion and IR.  Cervical: WNL, pain at end range of L and R rotation      Strength   Overall Strength Comments  UE: Shoulder: 4/5, Scapular: 4-/5,  Hips: 4/5, Knee: 4/5.       Palpation   Palpation comment  Mild soreness to palpate L mid quad;  Tight HS bil;  Mild joint stiffness with hip flex and IR bil;  Pain in R rhomboid and levator region;       Special Tests   Other special tests  Neg cervical compression;  Neg Knee testing.                 Objective measurements completed on examination: See above findings.      [facility] PT Treatment/Exercise - 12/22/19 0001      Exercises   Exercises  Knee/Hip      Knee/Hip Exercises: Stretches   Active Hamstring Stretch  2 reps;30 seconds    Active Hamstring Stretch Limitations  seated    Quad Stretch  2 reps;30 seconds;Left    Quad Stretch Limitations  Prone with strap    Other Knee/Hip  Stretches  SKTC 30 sec x2 on L;              PT Education - 12/22/19 1038    Education Details  PT POC, HEP, Exam findings.    Person(s) Educated  Patient    Methods  Explanation;Demonstration;Verbal cues;Handout    Comprehension  Verbalized understanding;Returned demonstration;Need further instruction       PT Short Term Goals - 12/22/19 1042      PT SHORT TERM GOAL #1   Title  Pt to be independent with initial HEP     Time  2    Period  Weeks    Status  New    Target Date  01/05/20      PT SHORT TERM GOAL #2   Title  Pt to report decreased pain in neck region to 0-3/10    Time  2    Period  Weeks    Status  New    Target Date  01/05/20        PT Long Term Goals - 12/22/19 1043      PT LONG TERM GOAL #1   Title  Pt to report decreased pain to 0-1/10 in neck and quad    Time  6    Period  Weeks    Status  New    Target Date  02/02/20      PT LONG TERM GOAL #2   Title  Pt to demo improved cervical ROM, to be WNL and pain free, to improve ability for head turns while driving.     Time  6    Period  Weeks    Status  New    Target Date  02/02/20      PT LONG TERM GOAL #3   Title  Pt to demo ability for walking up to 1 mi, stairs, and squats without pain in L quad, to incease ability for exercise.    Time  6    Period  Weeks    Status  New    Target Date  02/02/20  PT LONG TERM GOAL #4   Title  Pt to be independent with final HEP , for postural strengthening and LE strenthenign.    Time  6    Period  Weeks    Status  New    Target Date  02/02/20             Plan - 12/22/19 1047    Clinical Impression Statement  Pt presents with primary complaint of pain in L quad and R side of neck. Pt with muscle pain in L quad, increased pain with activity, and lack of effective HEP for Dx. He also has increased muscle tightness and pain at R rhomboid and levator region, pain with L and R cervical rotation. Pt with decreased strength in hips and UE/postural  muscles, and will benefit from education on HEP for both. Pt will benefit from DN and  manual therapy for Muscle spasms and pain. PT to benefit from skillled PT to improve deficits and pain.    Examination-Activity Limitations  Locomotion Level;Sit;Squat;Stairs    Examination-Participation Restrictions  Meal Prep;Cleaning;Community Activity    Stability/Clinical Decision Making  Stable/Uncomplicated    Clinical Decision Making  Low    Rehab Potential  Good    PT Frequency  2x / week    PT Duration  6 weeks    PT Treatment/Interventions  ADLs/Self Care Home Management;Cryotherapy;Electrical Stimulation;Ultrasound;Traction;Moist Heat;Iontophoresis 4mg /ml Dexamethasone;Gait training;Stair training;Functional mobility training;Therapeutic activities;Therapeutic exercise;Balance training;Neuromuscular re-education;Manual techniques;Patient/family education;Passive range of motion;Dry needling;Joint Manipulations;Spinal Manipulations;Taping    Consulted and Agree with Plan of Care  Patient       Patient will benefit from skilled therapeutic intervention in order to improve the following deficits and impairments:  Increased muscle spasms, Decreased activity tolerance, Pain, Impaired flexibility, Decreased strength, Improper body mechanics  Visit Diagnosis: Cervicalgia  Other muscle spasm  Pain in left thigh     Problem List Patient Active Problem List   Diagnosis Date Noted  . Nonallopathic lesion of thoracic region 11/16/2019  . Nonallopathic lesion of cervical region 11/16/2019  . Nonallopathic lesion of rib cage 11/16/2019  . Trigger point of right shoulder region 11/16/2019  . Scapular dyskinesis 10/06/2019  . Quadriceps strain, left, initial encounter 10/06/2019  . Depression, major, single episode, complete remission ([place]) 03/09/2018  . Erectile dysfunction 03/09/2018  . Obesity 03/09/2018  . Abnormal gait 01/02/2018  . 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  Osteoarthritis of spine 11/26/2017  . Knee pain 10/31/2017  . Thoracic spondylosis 10/28/2017  . Thoracic radiculopathy 05/23/2017  . Meralgia paresthetica of right side 05/23/2017  . AML (acute myeloid leukemia) in remission ([place]) 05/16/2015  . Hypothyroidism (acquired) 05/16/2015  . Vitamin B12 deficiency 10/14/2011    Lyndee Hensen, PT, DPT 11:15 AM  12/22/19    [facility] [place], [place], [address] Phone: [phone]   Fax:  [phone]  Name: Zachary Little MRN: [id number] Date of Birth: 1981-02-14

## 2019-12-27 ENCOUNTER — Ambulatory Visit: Payer: No Typology Code available for payment source | Admitting: Physical Therapy

## 2019-12-27 ENCOUNTER — Encounter: Payer: Self-pay | Admitting: Physical Therapy

## 2019-12-27 ENCOUNTER — Other Ambulatory Visit: Payer: Self-pay

## 2019-12-27 DIAGNOSIS — M542 Cervicalgia: Secondary | ICD-10-CM

## 2019-12-27 DIAGNOSIS — M62838 Other muscle spasm: Secondary | ICD-10-CM | POA: Diagnosis not present

## 2019-12-27 DIAGNOSIS — M79652 Pain in left thigh: Secondary | ICD-10-CM

## 2019-12-27 NOTE — Therapy (Signed)
[facility] [address] [place], [place], [address] Phone: [phone]   Fax:  [phone]  Physical Therapy Treatment  Patient Details  Name: Zachary Little MRN: [id number] Date of Birth: August 28, 1981 Referring Provider (PT): Charlann Boxer   Encounter Date: 12/27/2019  PT End of Session - 12/27/19 0845    Visit Number  2    Number of Visits  12    Date for PT Re-Evaluation  02/02/20    Authorization Type  [facility]    PT Start Time  0802    PT Stop Time  0854    PT Time Calculation (min)  52 min    Activity Tolerance  Patient tolerated treatment well    Behavior During Therapy  [facility] for tasks assessed/performed       Past Medical History:  Diagnosis Date  . Anxiety   . Blood transfusion without reported diagnosis 2010   During Chemo Treatments  . 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 Thyroid disease     Past Surgical History:  Procedure Laterality Date  . PORT-A-CATH REMOVAL      There were no vitals filed for this visit.  Subjective Assessment - 12/27/19 1145    Subjective  Pt states minmal change in pain.    Currently in Pain?  Yes    Pain Score  4     Pain Location  Leg    Pain Orientation  Left    Pain Descriptors / Indicators  Tightness    Pain Type  Acute pain    Pain Score  4    Pain Location  Neck    Pain Orientation  Right    Pain Descriptors / Indicators  Aching;Tightness    Pain Onset  More than a month ago    Pain Frequency  Intermittent                       [facility] Adult PT Treatment/Exercise - 12/27/19 [id number]      Posture/Postural Control   Posture Comments  Flexible posture, rounded shoulders.       Exercises   Exercises  Knee/Hip      Knee/Hip Exercises: Stretches   Active Hamstring Stretch  --    Active Hamstring Stretch Limitations  --    Quad Stretch  2 reps;30 seconds;Left    Quad Stretch Limitations  Prone with strap    Other Knee/Hip Stretches  SKTC 30  sec x2 on L;       Knee/Hip Exercises: Supine   Bridges  20 reps    Straight Leg Raises  20 reps;Both      Modalities   Modalities  Moist Heat      Moist Heat Therapy   Number Minutes Moist Heat  10 Minutes    Moist Heat Location  Cervical      Manual Therapy   Manual Therapy  Joint mobilization;Soft tissue mobilization;Manual Traction    Manual therapy comments  skilled palpation and monitoring of soft tissue with dry needling.     Joint Mobilization  Post hip mobs on L, gr 3;     Soft tissue mobilization  DTM and IASTM to L quad and R levator, and mid traps    Manual Traction  C-spine 10 sec x10;        Trigger Point Dry Needling - 12/27/19 0001    Consent Given?  Yes    Education Handout Provided  Yes  Muscles Treated Head and Neck  Levator scapulae    Other Dry Needling  mid trap, increased muscle length: R;     Levator Scapulae Response  Twitch response elicited;Palpable increased muscle length   R            PT Short Term Goals - 12/22/19 1042      PT SHORT TERM GOAL #1   Title  Pt to be independent with initial HEP     Time  2    Period  Weeks    Status  New    Target Date  01/05/20      PT SHORT TERM GOAL #2   Title  Pt to report decreased pain in neck region to 0-3/10    Time  2    Period  Weeks    Status  New    Target Date  01/05/20        PT Long Term Goals - 12/22/19 1043      PT LONG TERM GOAL #1   Title  Pt to report decreased pain to 0-1/10 in neck and quad    Time  6    Period  Weeks    Status  New    Target Date  02/02/20      PT LONG TERM GOAL #2   Title  Pt to demo improved cervical ROM, to be WNL and pain free, to improve ability for head turns while driving.     Time  6    Period  Weeks    Status  New    Target Date  02/02/20      PT LONG TERM GOAL #3   Title  Pt to demo ability for walking up to 1 mi, stairs, and squats without pain in L quad, to incease ability for exercise.    Time  6    Period  Weeks    Status   New    Target Date  02/02/20      PT LONG TERM GOAL #4   Title  Pt to be independent with final HEP , for postural strengthening and LE strenthenign.    Time  6    Period  Weeks    Status  New    Target Date  02/02/20            Plan - 12/27/19 1146    Clinical Impression Statement  Good twitch response in levator with DN today, will assess effects next visit, and continue manual therapy. Pt may benefit from DN for quad as well, will wear looser clothing next visit to get to quad area. Light strengthening started for quad today.    Examination-Activity Limitations  Locomotion Level;Sit;Squat;Stairs    Examination-Participation Restrictions  Meal Prep;Cleaning;Community Activity    Stability/Clinical Decision Making  Stable/Uncomplicated    Rehab Potential  Good    PT Frequency  2x / week    PT Duration  6 weeks    PT Treatment/Interventions  ADLs/Self Care Home Management;Cryotherapy;Electrical Stimulation;Ultrasound;Traction;Moist Heat;Iontophoresis 4mg /ml Dexamethasone;Gait training;Stair training;Functional mobility training;Therapeutic activities;Therapeutic exercise;Balance training;Neuromuscular re-education;Manual techniques;Patient/family education;Passive range of motion;Dry needling;Joint Manipulations;Spinal Manipulations;Taping    Consulted and Agree with Plan of Care  Patient       Patient will benefit from skilled therapeutic intervention in order to improve the following deficits and impairments:  Increased muscle spasms, Decreased activity tolerance, Pain, Impaired flexibility, Decreased strength, Improper body mechanics  Visit Diagnosis: Cervicalgia  Other muscle spasm  Pain in left thigh  Problem List Patient Active Problem List   Diagnosis Date Noted  . Nonallopathic lesion of thoracic region 11/16/2019  . Nonallopathic lesion of cervical region 11/16/2019  . Nonallopathic lesion of rib cage 11/16/2019  . Trigger point of right shoulder region  11/16/2019  . Scapular dyskinesis 10/06/2019  . Quadriceps strain, left, initial encounter 10/06/2019  . Depression, major, single episode, complete remission ([place]) 03/09/2018  . Erectile dysfunction 03/09/2018  . Obesity 03/09/2018  . Abnormal gait 01/02/2018  . 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 Osteoarthritis of spine 11/26/2017  . Knee pain 10/31/2017  . Thoracic spondylosis 10/28/2017  . Thoracic radiculopathy 05/23/2017  . Meralgia paresthetica of right side 05/23/2017  . AML (acute myeloid leukemia) in remission ([place]) 05/16/2015  . Hypothyroidism (acquired) 05/16/2015  . Vitamin B12 deficiency 10/14/2011    Lyndee Hensen, PT, DPT 11:47 AM  12/27/19    [facility] [place], [place], [address] Phone: [phone]   Fax:  [phone]  Name: Zachary Little MRN: [id number] Date of Birth: 03/12/1981

## 2019-12-27 NOTE — Progress Notes (Signed)
Patient: Zachary Little [facility]  Service Category: E/M  Provider: Gaspar Cola, MD  DOB: 1980/12/07  DOS: 12/29/2019  Location: Office  MRN: [id number]  Setting: Ambulatory outpatient  Referring Provider: Suzzanne Cloud, NP  Type: New Patient  Specialty: Interventional Pain Management  PCP: [name], PA  Location: Remote location  Delivery: TeleHealth     Virtual Encounter - Pain Management PROVIDER NOTE: Information contained herein reflects review and annotations entered in association with encounter. Interpretation of such information and data should be left to medically-trained personnel. Information provided to patient can be located elsewhere in the medical record under "Patient Instructions". Document created using STT-dictation technology, any transcriptional errors that may result from process are unintentional.    Contact & Pharmacy Preferred: [phone] Home: [phone] (home) Mobile: [phone] (mobile) E-mail: [email or web address]  [place], [name] - [address] [facility]. [address] [name] Phone: [phone] Fax: [phone]   Pre-screening note:  Our staff contacted [name] and offered him an "in person", "face-to-face" appointment versus a telephone encounter. He indicated preferring the telephone encounter, at this time.  Primary Reason(s) for Visit: Tele-Encounter for initial evaluation of one or more chronic problems (new to examiner) potentially causing chronic pain, and posing a threat to normal musculoskeletal function. (Level of risk: High) CC: Leg Pain (right upper thigh)  I contacted Dwan Bolt on 12/29/2019 via telephone.      I clearly identified myself as 38/01/2019, MD. I verified that I was speaking with the correct person using two identifiers (Name: [name], and date of birth: Mar 30, 1981).  Advanced Informed Consent I sought verbal advanced consent from 38/03/1981 for virtual visit interactions. I informed [name] of possible security and privacy concerns, risks, and limitations associated with providing "not-in-person" medical evaluation and management services. I also informed [name] of the availability of "in-person" appointments. Finally, I informed him that there would be a charge for the virtual visit and that he could be  personally, fully or partially, financially responsible for it. [name] expressed understanding and agreed to proceed.   HPI  [name] is a 39 y.o. year old, male patient, contacted today for an initial evaluation of his chronic pain. He has AML (acute myeloid leukemia) in remission ([place]); Hypothyroidism (acquired); Thoracic radiculopathy; Meralgia paresthetica of right side; Vitamin B12 deficiency; Thoracic spondylosis; Knee pain; Osteoarthritis of spine; Abnormal gait; Congenital cavus deformity of foot; Depression, major, single episode, complete remission ([place]); Erectile dysfunction; Obesity; Scapular dyskinesis; Quadriceps strain, left, initial encounter; Nonallopathic lesion of thoracic region; Nonallopathic lesion of cervical region; Nonallopathic lesion of rib cage; Trigger point of right shoulder region; Chronic pain syndrome; Neuropathic pain of thigh, right; Lateral femoral cutaneous entrapment syndrome, right; Lateral femoral cutaneous neuropathy, right; and Neuropathic pain involving lateral femoral cutaneous nerve, right on their problem list.   Onset and Duration: Gradual and Present longer than 3 months Cause of pain: Meralgia Paresthetica Severity: No change since onset, NAS-11 at its worse: 5/10, NAS-11 at its best: 1/10, NAS-11 now: 0/10 and NAS-11 on the average: 4/10 Timing: Not influenced by the time of the day Aggravating Factors: lying down Alleviating Factors: getting up from lying down position Associated Problems: Numbness Quality of Pain: Constant, Dull and Numb Previous  Examinations or Tests: xrays, MRI, Neurosurgical Previous Treatments: medications  According to the patient the primary problem is that of right thigh pain and numbness that started approximately 3 years  ago and has not really gone away.  The pain and the numbness is typically present once he lays down to sleep and he has noticed that it occurs when he is right leg is rotated outward approximately 45 degrees and if he turns the leg back and to the point where his feet are pointing upwards than it tends to get better.  This would suggest that this is positional.  He has been seen by several neurologist including Dr. Park Breed, who is also a board-certified pain specialist.  According to his exam, the patient had a positive Tinel's sign over the lateral femoral cutaneous nerve suggesting a right meralgia paresthetica.  On 01/30/2017 Dr. Wilford Grist performed a diagnostic right lateral femoral cutaneous nerve block which provided the patient with 8 hours of complete relief of the pain.  On follow-up on 02/20/2017 the patient indicated that his pain had gone from being there all the time to essentially being present only when he laid down and rotated his right leg outward.  At that time he was given Flexeril and for some reason or another he did not see Dr. Wilford Grist anymore.  Today he comes in to see if there is anything that can be done for him.  He indicates that he has had this treated with medications, primarily membrane stabilizer such as they gabapentin, but none of them have provided him with any long-term benefit.  Today we talked about possible treatment alternatives.  Interestingly enough, there is evidence of the patient having had an EMG/PNCV done around 06/24/2016 by Dr. Jannifer Franklin which indicated that he did not have a right L4 or L5 radiculopathy/radiculitis.  The patient denies any type of injuries or surgeries in that area.  MRI of the lumbar spine was completely negative.  Plain films were reviewed  today and they were also found to be completely negative.  The patient denies any significant problems with his back.  He indicates the pattern of the pain and numbness to be over the area of his right thigh, in the anterior aspect of the thigh with no spillover towards the lateral portion of the thigh or any symptoms below the knee.  In fact he refers that the pain and numbness goals down to approximately 2 to 3 inches from his patella and in the top portion of the thigh he says that it starts about 5 inches below his waist.  The presentation of this meralgia paresthetica does not seem to me as being typical since it is not usual to see worsening of the numbness and pain with lateral rotation of the leg.  As seen below, the lateral femoral cutaneous nerve typically will be entrapped by the inguinal ligament where it attaches to the anterior superior iliac spine.    At this point, I took the opportunity to explain to the patient the results of the diagnostic right-sided lateral femoral cutaneous (LFC) nerve block performed by Dr. Park Breed.  I explained in detail the role of the local anesthetic and the steroids in the diagnosis of this condition.  From the results obtained from the nerve block, it would seem that a confirmed that we are in fact dealing with a right-sided meralgia paresthetica.  The next step would be to consider doing an MRI of the area to determine if the entrapment can be identified for the purpose of possibly surgically correcting it.  I spoke to the patient about the possibility of using an ultrasound but further review of the literature would suggest  that an MRI would be a better choice.  Next, in terms of treatment alternatives, and going from less invasive to more invasive, I talked to him about the alternative of a series of 3 lateral femoral continuous nerve blocks to see if this could improve his condition.  If the blocks proved to be diagnostic, but he does not obtain any  long-term benefit, then the next alternative would be to consider radiofrequency ablation.  Finally, the other 2 alternatives would be surgical.  One would be to identify and correct the entrapment, and the second one would be to do a trial and possible implant of a peripheral nerve stimulator for the long-term treatment of this pain.  The patient indicated having understood and he has agreed to get started with the series of lateral femoral cutaneous nerve blocks and to further investigate the possible entrapment with radiological imaging.  Historic Controlled Substance Pharmacotherapy Review  Current opioid analgesics: None Highest recorded MME/day: 0 mg/day MME/day: 0 mg/day   Historical Monitoring: The patient  reports no history of drug use. List of all UDS Test(s): No results found. List of other Serum/Urine Drug Screening Test(s):  No results found. Historical Background Evaluation: [place] PMP: PDMP reviewed during this encounter. Online review of the past 62-monthperiod conducted.             PMP NARX Score Report:  Narcotic: 010 Sedative: 030 Stimulant: 000 Risk Assessment Profile: PMP NARX Overdose Risk Score: 110  Pharmacologic Plan: Non-opioid analgesic therapy offered.            Initial impression: The patient indicated having no interest, at this time.  Meds   Current Outpatient Medications:  .  Cholecalciferol (VITAMIN D-3) 125 MCG (5000 UT) TABS, Take 1 tablet by mouth daily., Disp: 90 tablet, Rfl: 3 .  levothyroxine (SYNTHROID) 150 MCG tablet, TAKE 1 TABLET BY MOUTH DAILY., Disp: 90 tablet, Rfl: 1  ROS  Cardiovascular: No reported cardiovascular signs or symptoms such as High blood pressure, coronary artery disease, abnormal heart rate or rhythm, heart attack, blood thinner therapy or heart weakness and/or failure Pulmonary or Respiratory: No reported pulmonary signs or symptoms such as wheezing and difficulty taking a deep full breath (Asthma), difficulty blowing  air out (Emphysema), coughing up mucus (Bronchitis), persistent dry cough, or temporary stoppage of breathing during sleep Neurological: No reported neurological signs or symptoms such as seizures, abnormal skin sensations, urinary and/or fecal incontinence, being born with an abnormal open spine and/or a tethered spinal cord Psychological-Psychiatric: No reported psychological or psychiatric signs or symptoms such as difficulty sleeping, anxiety, depression, delusions or hallucinations (schizophrenial), mood swings (bipolar disorders) or suicidal ideations or attempts Gastrointestinal: No reported gastrointestinal signs or symptoms such as vomiting or evacuating blood, reflux, heartburn, alternating episodes of diarrhea and constipation, inflamed or scarred liver, or pancreas or irrregular and/or infrequent bowel movements Genitourinary: No reported renal or genitourinary signs or symptoms such as difficulty voiding or producing urine, peeing blood, non-functioning kidney, kidney stones, difficulty emptying the bladder, difficulty controlling the flow of urine, or chronic kidney disease Hematological: No reported hematological signs or symptoms such as prolonged bleeding, low or poor functioning platelets, bruising or bleeding easily, hereditary bleeding problems, low energy levels due to low hemoglobin or being anemic Endocrine: Slow thyroid Rheumatologic: No reported rheumatological signs and symptoms such as fatigue, joint pain, tenderness, swelling, redness, heat, stiffness, decreased range of motion, with or without associated rash Musculoskeletal: Negative for myasthenia gravis, muscular dystrophy, multiple sclerosis or malignant  hyperthermia Work History: Unemployed  Allergies  Mr. Inabinet has No Known Allergies.  Laboratory Chemistry Profile   Screening Lab Results  Component Value Date   HIV NON-REACTIVE 08/26/2017   Inflammation (CRP: Acute Phase) (ESR: Chronic Phase) Lab Results   Component Value Date   CRP 0.4 (L) 05/22/2017   ESRSEDRATE 2 11/16/2019                        Rheumatology No results found.  Renal Lab Results  Component Value Date   BUN 18 11/17/2019   CREATININE 0.89 11/17/2019   GFR 95.92 11/17/2019                            Hepatic Lab Results  Component Value Date   AST 17 11/17/2019   ALT 22 11/17/2019   ALBUMIN 4.5 11/17/2019   ALKPHOS 61 11/17/2019                       Electrolytes Lab Results  Component Value Date   NA 142 11/17/2019   K 4.3 11/17/2019   CL 103 11/17/2019   CALCIUM 9.7 11/17/2019                       Neuropathy Lab Results  Component Value Date   VITAMINB12 434 03/09/2018   HIV NON-REACTIVE 08/26/2017                       CNS No results found.  Bone Lab Results  Component Value Date   VD25OH 24.1 06/05/2015   VD125OH2TOT 69 11/16/2019   [id number] 69 11/16/2019   [id number] <8 11/16/2019   TESTOFREE 19.0 11/16/2019   TESTOSTERONE 382 11/16/2019                        Coagulation Lab Results  Component Value Date   PLT 183.0 11/17/2019                       Cardiovascular Lab Results  Component Value Date   CKTOTAL 75.0 06/05/2015   HGB 16.1 11/17/2019   HCT 48.2 11/17/2019                        ID Lab Results  Component Value Date   HIV NON-REACTIVE 08/26/2017   Cancer No results found.  Endocrine Lab Results  Component Value Date   TSH 0.47 06/02/2019   FREET4 1.22 06/02/2019   TESTOFREE 19.0 11/16/2019   TESTOSTERONE 382 11/16/2019   SHBG 28.9 11/16/2019                       Note: Lab results reviewed.  Imaging Review  Cervical Imaging: Cervical MR wo contrast:  Results for orders placed during the hospital encounter of 11/26/17  MR Cervical Spine Wo Contrast   Narrative CLINICAL DATA:  Left-sided neck and shoulder pain. History of leukemia 2010.  EXAM: MRI CERVICAL SPINE WITHOUT CONTRAST  TECHNIQUE: Multiplanar, multisequence MR imaging of the cervical  spine was performed. No intravenous contrast was administered.  COMPARISON:  Radiography 11/10/2017.  MRI 07/09/2016.  FINDINGS: Alignment: Normal  Vertebrae: Normal  Cord: No cord compression or primary cord lesion.  Posterior Fossa, vertebral arteries, paraspinal tissues: Normal  Disc levels:  No abnormality from the  foramen magnum through C4-5.  C5-6 shows a small central disc bulge that narrows the ventral subarachnoid space but does not compress the cord or show foraminal extension.  C6-7 and C7-T1 are normal.  IMPRESSION: Newly seen central disc bulge at C5-6 that narrows the ventral subarachnoid space but does not compress the cord or show foraminal extension. Other levels are normal.   Electronically Signed   By: Nelson Chimes M.D.   On: 11/26/2017 15:20    Cervical DG 2-3 views:  Results for orders placed in visit on 11/10/17  DG Cervical Spine 2 or 3 views   Narrative CLINICAL DATA:  39 year old male with bilateral neck and back pain since February with no known injury. Low back pain progresses at night when supine.  EXAM: CERVICAL SPINE - 2-3 VIEW  COMPARISON:  Cervical spine MRI 07/09/2016.  FINDINGS: Chronic straightening of cervical lordosis. Normal prevertebral soft tissue contour. Cervicothoracic junction alignment is within normal limits. Normal AP alignment. C1-C2 alignment and the odontoid appear normal. Stable disc spaces, preserved except for C5-C6 and C6-C7. No acute osseous abnormality identified. Negative lung apices.  IMPRESSION: Radiographic appearance of the cervical spine appears stable to the MRI in 2017. Mild C5-C6 and C6-C7 disc space loss. No acute osseous abnormality identified.   Electronically Signed   By: Genevie Ann M.D.   On: 11/11/2017 08:14    Thoracic Imaging: Thoracic MR wo contrast:  Results for orders placed during the hospital encounter of 12/21/17  MR Thoracic Spine Wo Contrast   Narrative CLINICAL DATA:   Left flank and back pain over the last year. History of leukemia.  EXAM: MRI THORACIC SPINE WITHOUT CONTRAST  TECHNIQUE: Multiplanar, multisequence MR imaging of the thoracic spine was performed. No intravenous contrast was administered.  COMPARISON:  Radiography 11/10/2017  FINDINGS: Alignment:  Normal  Vertebrae: Normal. No focal lesion or marrow space abnormality. Insignificant tiny hemangioma within the central T11 vertebral body.  Cord:  No abnormal cord finding.  Paraspinal and other soft tissues: Normal  Disc levels:  Normal. No degenerative disc disease. No stenosis. No foraminal abnormality.  IMPRESSION: Normal examination. No abnormality seen to explain back or flank pain.   Electronically Signed   By: Nelson Chimes M.D.   On: 12/21/2017 10:14    Thoracic DG 2-3 views:  Results for orders placed in visit on 11/10/17  DG Thoracic Spine 2 View   Narrative CLINICAL DATA:  Bilateral neck and upper back pain radiating into the lower back for the past 10 months with no known injury symptoms are greatest at night when in a supine position.  EXAM: THORACIC SPINE 2 VIEWS  COMPARISON:  Thoracic spine series dated Mar 27, 2017  FINDINGS: The thoracic vertebral bodies are preserved in height. There is chronic gentle dextrocurvature centered in the midthoracic spine. The pedicles are intact. There are no abnormal paravertebral soft tissue densities. There is mild multilevel degenerative disc space narrowing.  IMPRESSION: There is no acute bony abnormality of the thoracic spine. There is very mild degenerative disc disease at multiple levels.   Electronically Signed   By: David  Martinique M.D.   On: 11/11/2017 08:12    Lumbosacral Imaging: Lumbar DG 2-3 views:  Results for orders placed in visit on 11/10/17  DG Lumbar Spine 2-3 Views   Narrative CLINICAL DATA:  39 year old male with bilateral neck and back pain since February with no known injury. Low  back pain progresses at night when supine.  EXAM:  LUMBAR SPINE - 2-3 VIEW  COMPARISON:  MRI lumbar spine 07/09/2016. Lumbar radiographs 02/06/2016. Thoracic spine radiographs today reported separately.  FINDINGS: Normal thoracic spine segmentation demonstrated on the radiographs today. Normal lumbar segmentation. Slight levoconvex lumbar spine curvature is stable since 2017. Straightening of lumbar lordosis also is stable. Disc spaces appear relatively preserved and stable since the 2017 MRI. Minimal endplate spurring is chronic. Sacral ala and SI joints appear stable and normal. No acute osseous abnormality identified.  IMPRESSION: Stable since 2017. Straightening of lumbar lordosis with minimal levoconvex lumbar curvature. No acute osseous abnormality.   Electronically Signed   By: Genevie Ann M.D.   On: 11/11/2017 08:17    Hip Imaging: Hip-L DG 2-3 views:  Results for orders placed in visit on 12/15/19  DG HIP UNILAT WITH PELVIS 2-3 VIEWS LEFT   Narrative CLINICAL DATA:  Intermittent left hip pain  EXAM: DG HIP (WITH OR WITHOUT PELVIS) 2-3V LEFT  COMPARISON:  None.  FINDINGS: Alignment is anatomic. No acute fracture. No intrinsic osseous lesion. Joint space is preserved.  IMPRESSION: Negative.   Electronically Signed   By: Macy Mis M.D.   On: 12/15/2019 10:37    Knee Imaging:. Knee-L MR w contrast:  Results for orders placed during the hospital encounter of 03/08/18  MR Knee Left  Wo Contrast   Narrative CLINICAL DATA:  Medial left knee pain for the past 5 months. Injury in November 2018.  EXAM: MRI OF THE LEFT KNEE WITHOUT CONTRAST  TECHNIQUE: Multiplanar, multisequence MR imaging of the knee was performed. No intravenous contrast was administered.  COMPARISON:  Left knee x-rays dated October 01, 2017.  FINDINGS: MENISCI  Medial meniscus:  Intact.  Lateral meniscus:  Intact.  LIGAMENTS  Cruciates:  Intact ACL and PCL.  Collaterals:  Medial collateral ligament is intact. Lateral collateral ligament complex is intact.  CARTILAGE  Patellofemoral: Focal near full-thickness fissure over the trochlear groove.  Medial:  No chondral defect.  Lateral:  No chondral defect.  Joint:  No joint effusion. Normal Hoffa's fat. No plical thickening.  Popliteal Fossa:  No Baker cyst. Intact popliteus tendon.  Extensor Mechanism: Intact quadriceps tendon and patellar tendon. Intact medial and lateral patellar retinaculum. Intact MPFL.  Bones: No focal marrow signal abnormality. No fracture or dislocation.  Other: None.  IMPRESSION: 1. No acute abnormality.  No evidence of internal derangement.   Electronically Signed   By: Titus Dubin M.D.   On: 03/08/2018 16:27    Knee-L DG 1-2 views:  Results for orders placed in visit on 10/01/17  DG Knee 1-2 Views Left   Narrative CLINICAL DATA:  One week of left knee pain near the patellar tendon without acute injury. History of AML in remission.  EXAM: LEFT KNEE - 1-2 VIEW  COMPARISON:  None in PACs  FINDINGS: The bones are subjectively adequately mineralized. There is no lytic nor blastic lesion. There is no acute or old fracture or periosteal reaction. The joint spaces are well maintained. There is no significant osteophyte formation. No joint effusion is observed. In the infrapatellar region no soft tissue abnormalities are demonstrated.  The observed portions of the right knee are unremarkable.  IMPRESSION: There is no acute or significant chronic bony abnormality of the left knee.   Electronically Signed   By: David  Martinique M.D.   On: 10/01/2017 14:01    Complexity Note: Imaging results reviewed. Results shared with Mr. Dudzinski, using Layman's terms.  [place]  Drug: Mr. Mestre  reports no history of drug use. Alcohol:  reports current alcohol use. Tobacco:  reports that he has never smoked. He has never used smokeless  tobacco. Medical:  has a past medical history of Anxiety, Blood transfusion without reported diagnosis (2010), Leukemia, acute myeloid, in remission ([place]) (2010), and Thyroid disease. Family: family history includes Diabetes in his maternal grandfather and paternal grandfather; Healthy in his father and mother; Multiple sclerosis in his maternal grandmother.  Past Surgical History:  Procedure Laterality Date  . PORT-A-CATH REMOVAL     Active Ambulatory Problems    Diagnosis Date Noted  . AML (acute myeloid leukemia) in remission ([place]) 05/16/2015  . Hypothyroidism (acquired) 05/16/2015  . Thoracic radiculopathy 05/23/2017  . Meralgia paresthetica of right side 05/23/2017  . Vitamin B12 deficiency 10/14/2011  . Thoracic spondylosis 10/28/2017  . Knee pain 10/31/2017  . Osteoarthritis of spine 11/26/2017  . Abnormal gait 01/02/2018  . 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 Depression, major, single episode, complete remission ([place]) 03/09/2018  . Erectile dysfunction 03/09/2018  . Obesity 03/09/2018  . Scapular dyskinesis 10/06/2019  . Quadriceps strain, left, initial encounter 10/06/2019  . Nonallopathic lesion of thoracic region 11/16/2019  . Nonallopathic lesion of cervical region 11/16/2019  . Nonallopathic lesion of rib cage 11/16/2019  . Trigger point of right shoulder region 11/16/2019  . Chronic pain syndrome 12/29/2019  . Neuropathic pain of thigh, right 12/29/2019  . Lateral femoral cutaneous entrapment syndrome, right 12/29/2019  . 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 Neuropathic pain involving lateral femoral cutaneous nerve, right 12/29/2019   Resolved Ambulatory Problems    Diagnosis Date Noted  . No Resolved Ambulatory Problems   Past Medical History:  Diagnosis Date  . Anxiety   . Blood transfusion without reported diagnosis 2010  . Leukemia, acute myeloid, in remission ([place]) 2010  . Thyroid disease    Assessment  Primary  Diagnosis & Pertinent Problem List: The primary encounter diagnosis was Meralgia paresthetica of right side. Diagnoses of Neuropathic pain of thigh, right, Lateral femoral cutaneous entrapment syndrome, right, Lateral femoral cutaneous neuropathy, right, Neuropathic pain involving lateral femoral cutaneous nerve, right, Chronic pain syndrome, and Pain in scrotum were also pertinent to this visit.  Visit Diagnosis (New problems to examiner): 1. Meralgia paresthetica of right side   2. Neuropathic pain of thigh, right   3. Lateral femoral cutaneous entrapment syndrome, right   4. Lateral femoral cutaneous neuropathy, right   5. Neuropathic pain involving lateral femoral cutaneous nerve, right   6. Chronic pain syndrome   7. Pain in scrotum    Plan of Care (Initial workup plan)  Note: Mr. Chrisley was reminded that as per protocol, today's visit has been an evaluation only. We have not taken over the patient's controlled substance management.  Problem-specific plan: No problem-specific Assessment & Plan notes found for this encounter.  Lab Orders  No laboratory test(s) ordered today    Imaging Orders     MR PELVIS WO CONTRAST Referral Orders  No referral(s) requested today    Procedure Orders     Misc procedure Pharmacotherapy (current): Medications ordered:  No orders of the defined types were placed in this encounter.    Pharmacological management options:  Opioid Analgesics: The patient was informed that there is no guarantee that he would be a candidate for opioid analgesics. The decision will be made following CDC guidelines. This decision will be based on the results of diagnostic studies,  as well as Mr. Haydu risk profile.   Membrane stabilizer: To be determined at a later time  Muscle relaxant: To be determined at a later time  NSAID: To be determined at a later time  Other analgesic(s): To be determined at a later time   Interventional management options: Mr.  Speranza was informed that there is no guarantee that he would be a candidate for interventional therapies. The decision will be based on the results of diagnostic studies, as well as Mr. Huckins risk profile.  Procedure(s) under consideration:  Diagnostic right-sided lateral femoral cutaneous nerve block #1 under fluoroscopic guidance/ultrasound guidance    Provider-requested follow-up: Return for Procedure (no sedation): (R) lateral femoral cutaneous (LFC) NB #1, (ASAP).  Future Appointments  Date Time Provider [place]  01/04/2020  1:00 PM Lyndee Hensen, PT [facility]  01/06/2020  1:00 PM Lyndee Hensen, PT [facility]  01/11/2020  1:00 PM Lyndee Hensen, PT [facility]  01/13/2020  3:15 PM Lyndee Hensen, PT [facility]  01/18/2020  7:45 AM Suzzanne Cloud, NP [facility] None  01/26/2020 11:00 AM Lyndal Pulley, DO [facility] None   Total duration of encounter: 35 minutes.  Primary Care Physician: [name], PA Note by: Gaspar Cola, MD Date: 12/29/2019; Time: 4:00 PM

## 2019-12-29 ENCOUNTER — Other Ambulatory Visit: Payer: Self-pay

## 2019-12-29 ENCOUNTER — Ambulatory Visit: Payer: No Typology Code available for payment source | Admitting: Physical Therapy

## 2019-12-29 ENCOUNTER — Encounter: Payer: Self-pay | Admitting: Physical Therapy

## 2019-12-29 ENCOUNTER — Ambulatory Visit: Payer: No Typology Code available for payment source | Attending: Pain Medicine | Admitting: Pain Medicine

## 2019-12-29 DIAGNOSIS — M542 Cervicalgia: Secondary | ICD-10-CM

## 2019-12-29 DIAGNOSIS — N5082 Scrotal pain: Secondary | ICD-10-CM | POA: Diagnosis not present

## 2019-12-29 DIAGNOSIS — M792 Neuralgia and neuritis, unspecified: Secondary | ICD-10-CM | POA: Diagnosis not present

## 2019-12-29 DIAGNOSIS — M79652 Pain in left thigh: Secondary | ICD-10-CM | POA: Diagnosis not present

## 2019-12-29 DIAGNOSIS — G894 Chronic pain syndrome: Secondary | ICD-10-CM

## 2019-12-29 DIAGNOSIS — G5711 Meralgia paresthetica, right lower limb: Secondary | ICD-10-CM | POA: Insufficient documentation

## 2019-12-29 DIAGNOSIS — M62838 Other muscle spasm: Secondary | ICD-10-CM | POA: Diagnosis not present

## 2019-12-29 NOTE — Therapy (Signed)
[facility] [address] [place], [place], [address] Phone: [phone]   Fax:  [phone]  Physical Therapy Treatment  Patient Details  Name: Zachary Little MRN: [id number] Date of Birth: 1981-10-10 Referring Provider (PT): Charlann Boxer   Encounter Date: 12/29/2019  PT End of Session - 12/29/19 1025    Visit Number  3    Number of Visits  12    Date for PT Re-Evaluation  02/02/20    Authorization Type  [facility]    PT Start Time  1019    PT Stop Time  1105    PT Time Calculation (min)  46 min    Activity Tolerance  Patient tolerated treatment well    Behavior During Therapy  [facility] for tasks assessed/performed       Past Medical History:  Diagnosis Date  . Anxiety   . Blood transfusion without reported diagnosis 2010   During Chemo Treatments  . 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 Thyroid disease     Past Surgical History:  Procedure Laterality Date  . PORT-A-CATH REMOVAL      There were no vitals filed for this visit.  Subjective Assessment - 12/29/19 1023    Subjective  Pt states leg is bothering him, neck feels a little better this am.    Currently in Pain?  Yes    Pain Score  4     Pain Location  Leg    Pain Orientation  Left    Pain Descriptors / Indicators  Tightness    Pain Type  Acute pain    Pain Onset  More than a month ago    Pain Frequency  Intermittent    Multiple Pain Sites  Yes    Pain Score  4    Pain Location  Neck    Pain Orientation  Right    Pain Descriptors / Indicators  Aching;Tightness    Pain Type  Chronic pain    Pain Onset  More than a month ago    Pain Frequency  Intermittent                       [facility] Adult PT Treatment/Exercise - 12/29/19 1026      Posture/Postural Control   Posture Comments  Flexible posture, rounded shoulders.       Exercises   Exercises  Knee/Hip      Knee/Hip Exercises: Stretches   Quad Stretch  2 reps;30 seconds;Left     Quad Stretch Limitations  staning     Piriformis Stretch  2 reps;30 seconds    Piriformis Stretch Limitations  seated    Other Knee/Hip Stretches  SKTC 30 sec x2 on L;     Other Knee/Hip Stretches  --      Knee/Hip Exercises: Aerobic   Stationary Bike  L2 x 7 min;       Knee/Hip Exercises: Standing   Forward Step Up  15 reps;Both;Step Height: 6"    Forward Step Up Limitations  slow eccentric lowering      Knee/Hip Exercises: Seated   Long Arc Quad  20 reps    Long Arc Quad Limitations  2.5 lb      Knee/Hip Exercises: Supine   Bridges  --    Bridges with [employer]  20 reps    Straight Leg Raises  20 reps;Both      Knee/Hip Exercises: Sidelying   Clams  x15 bil;  Modalities   Modalities  Moist Heat      Moist Heat Therapy   Moist Heat Location  Cervical      Manual Therapy   Manual Therapy  Joint mobilization;Soft tissue mobilization;Manual Traction    Manual therapy comments  --    Joint Mobilization  Post and INF hip mobs on L, gr 3;     Soft tissue mobilization  DTM and IASTM to L quad     Manual Traction  C-spine 10 sec x10;                PT Short Term Goals - 12/22/19 1042      PT SHORT TERM GOAL #1   Title  Pt to be independent with initial HEP     Time  2    Period  Weeks    Status  New    Target Date  01/05/20      PT SHORT TERM GOAL #2   Title  Pt to report decreased pain in neck region to 0-3/10    Time  2    Period  Weeks    Status  New    Target Date  01/05/20        PT Long Term Goals - 12/22/19 1043      PT LONG TERM GOAL #1   Title  Pt to report decreased pain to 0-1/10 in neck and quad    Time  6    Period  Weeks    Status  New    Target Date  02/02/20      PT LONG TERM GOAL #2   Title  Pt to demo improved cervical ROM, to be WNL and pain free, to improve ability for head turns while driving.     Time  6    Period  Weeks    Status  New    Target Date  02/02/20      PT LONG TERM GOAL #3   Title  Pt to demo  ability for walking up to 1 mi, stairs, and squats without pain in L quad, to incease ability for exercise.    Time  6    Period  Weeks    Status  New    Target Date  02/02/20      PT LONG TERM GOAL #4   Title  Pt to be independent with final HEP , for postural strengthening and LE strenthenign.    Time  6    Period  Weeks    Status  New    Target Date  02/02/20            Plan - 12/29/19 1230    Clinical Impression Statement  Quad sore today, with trigger point in central rec fem. Manual done today to address, pt declined DN today but may benefit from next visit. Focus also on hip mobility due to pt having pain in thigh with sitting, could have some impingement causing thigh pain.  Plan to progress as tolerate.    Examination-Activity Limitations  Locomotion Level;Sit;Squat;Stairs    Examination-Participation Restrictions  Meal Prep;Cleaning;Community Activity    Stability/Clinical Decision Making  Stable/Uncomplicated    Rehab Potential  Good    PT Frequency  2x / week    PT Duration  6 weeks    PT Treatment/Interventions  ADLs/Self Care Home Management;Cryotherapy;Electrical Stimulation;Ultrasound;Traction;Moist Heat;Iontophoresis 4mg /ml Dexamethasone;Gait training;Stair training;Functional mobility training;Therapeutic activities;Therapeutic exercise;Balance training;Neuromuscular re-education;Manual techniques;Patient/family education;Passive range of motion;Dry needling;Joint Manipulations;Spinal Manipulations;Taping  Consulted and Agree with Plan of Care  Patient       Patient will benefit from skilled therapeutic intervention in order to improve the following deficits and impairments:  Increased muscle spasms, Decreased activity tolerance, Pain, Impaired flexibility, Decreased strength, Improper body mechanics  Visit Diagnosis: Cervicalgia  Other muscle spasm  Pain in left thigh     Problem List Patient Active Problem List   Diagnosis Date Noted  .  Nonallopathic lesion of thoracic region 11/16/2019  . Nonallopathic lesion of cervical region 11/16/2019  . Nonallopathic lesion of rib cage 11/16/2019  . Trigger point of right shoulder region 11/16/2019  . Scapular dyskinesis 10/06/2019  . Quadriceps strain, left, initial encounter 10/06/2019  . Depression, major, single episode, complete remission ([place]) 03/09/2018  . Erectile dysfunction 03/09/2018  . Obesity 03/09/2018  . Abnormal gait 01/02/2018  . 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 Osteoarthritis of spine 11/26/2017  . Knee pain 10/31/2017  . Thoracic spondylosis 10/28/2017  . Thoracic radiculopathy 05/23/2017  . Meralgia paresthetica of right side 05/23/2017  . 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 Hypothyroidism (acquired) 05/16/2015  . Vitamin B12 deficiency 10/14/2011    Lyndee Hensen, PT, DPT 12:32 PM  12/29/19    [facility] [place], [place], [address] Phone: [phone]   Fax:  [phone]  Name: Shaquile Dusel MRN: [id number] Date of Birth: Mar 19, 1981

## 2019-12-29 NOTE — Patient Instructions (Signed)
____________________________________________________________________________________________  Preparing for your procedure (without sedation)  Procedure appointments are limited to planned procedures: . No Prescription Refills. . No disability issues will be discussed. . No medication changes will be discussed.  Instructions: . Oral Intake: Do not eat or drink anything for at least 3 hours prior to your procedure. . Transportation: Unless otherwise stated by your physician, you may drive yourself after the procedure. . Blood Pressure Medicine: Take your blood pressure medicine with a sip of water the morning of the procedure. . Blood thinners: Notify our staff if you are taking any blood thinners. Depending on which one you take, there will be specific instructions on how and when to stop it. . Diabetics on insulin: Notify the staff so that you can be scheduled 1st case in the morning. If your diabetes requires high dose insulin, take only  of your normal insulin dose the morning of the procedure and notify the staff that you have done so. . Preventing infections: Shower with an antibacterial soap the morning of your procedure.  . Build-up your immune system: Take 1000 mg of Vitamin C with every meal (3 times a day) the day prior to your procedure. . Antibiotics: Inform the staff if you have a condition or reason that requires you to take antibiotics before dental procedures. . Pregnancy: If you are pregnant, call and cancel the procedure. . Sickness: If you have a cold, fever, or any active infections, call and cancel the procedure. . Arrival: You must be in the facility at least 30 minutes prior to your scheduled procedure. . Children: Do not bring any children with you. . Dress appropriately: Bring dark clothing that you would not mind if they get stained. . Valuables: Do not bring any jewelry or valuables.  Reasons to call and reschedule or cancel your procedure: (Following these  recommendations will minimize the risk of a serious complication.) . Surgeries: Avoid having procedures within 2 weeks of any surgery. (Avoid for 2 weeks before or after any surgery). . Flu Shots: Avoid having procedures within 2 weeks of a flu shots or . (Avoid for 2 weeks before or after immunizations). . Barium: Avoid having a procedure within 7-10 days after having had a radiological study involving the use of radiological contrast. (Myelograms, Barium swallow or enema study). . Heart attacks: Avoid any elective procedures or surgeries for the initial 6 months after a "Myocardial Infarction" (Heart Attack). . Blood thinners: It is imperative that you stop these medications before procedures. Let us know if you if you take any blood thinner.  . Infection: Avoid procedures during or within two weeks of an infection (including chest colds or gastrointestinal problems). Symptoms associated with infections include: Localized redness, fever, chills, night sweats or profuse sweating, burning sensation when voiding, cough, congestion, stuffiness, runny nose, sore throat, diarrhea, nausea, vomiting, cold or Flu symptoms, recent or current infections. It is specially important if the infection is over the area that we intend to treat. . Heart and lung problems: Symptoms that may suggest an active cardiopulmonary problem include: cough, chest pain, breathing difficulties or shortness of breath, dizziness, ankle swelling, uncontrolled high or unusually low blood pressure, and/or palpitations. If you are experiencing any of these symptoms, cancel your procedure and contact your primary care physician for an evaluation.  Remember:  Regular Business hours are:  Monday to Thursday 8:00 AM to 4:00 PM  Provider's Schedule: Maxton Noreen, MD:  Procedure days: Tuesday and Thursday 7:30 AM to 4:00 PM  [place]   Holley Raring, MD:  Procedure days: Monday and Wednesday 7:30 AM to 4:00  PM ____________________________________________________________________________________________   ____________________________________________________________________________________________  General Risks and Possible Complications  Patient Responsibilities: It is important that you read this as it is part of your informed consent. It is our duty to inform you of the risks and possible complications associated with treatments offered to you. It is your responsibility as a patient to read this and to ask questions about anything that is not clear or that you believe was not covered in this document.  Patient's Rights: You have the right to refuse treatment. You also have the right to change your mind, even after initially having agreed to have the treatment done. However, under this last option, if you wait until the last second to change your mind, you may be charged for the materials used up to that point.  Introduction: Medicine is not an Chief Strategy Officer. Everything in Medicine, including the lack of treatment(s), carries the potential for danger, harm, or loss (which is by definition: Risk). In Medicine, a complication is a secondary problem, condition, or disease that can aggravate an already existing one. All treatments carry the risk of possible complications. The fact that a side effects or complications occurs, does not imply that the treatment was conducted incorrectly. It must be clearly understood that these can happen even when everything is done following the highest safety standards.  No treatment: You can choose not to proceed with the proposed treatment alternative. The "PRO(s)" would include: avoiding the risk of complications associated with the therapy. The "CON(s)" would include: not getting any of the treatment benefits. These benefits fall under one of three categories: diagnostic; therapeutic; and/or palliative. Diagnostic benefits include: getting information which can ultimately  lead to improvement of the disease or symptom(s). Therapeutic benefits are those associated with the successful treatment of the disease. Finally, palliative benefits are those related to the decrease of the primary symptoms, without necessarily curing the condition (example: decreasing the pain from a flare-up of a chronic condition, such as incurable terminal cancer).  General Risks and Complications: These are associated to most interventional treatments. They can occur alone, or in combination. They fall under one of the following six (6) categories: no benefit or worsening of symptoms; bleeding; infection; nerve damage; allergic reactions; and/or death. 1. No benefits or worsening of symptoms: In Medicine there are no guarantees, only probabilities. No healthcare provider can ever guarantee that a medical treatment will work, they can only state the probability that it may. Furthermore, there is always the possibility that the condition may worsen, either directly, or indirectly, as a consequence of the treatment. 2. Bleeding: This is more common if the patient is taking a blood thinner, either prescription or over the counter (example: Goody Powders, Fish oil, Aspirin, Garlic, etc.), or if suffering a condition associated with impaired coagulation (example: Hemophilia, cirrhosis of the liver, low platelet counts, etc.). However, even if you do not have one on these, it can still happen. If you have any of these conditions, or take one of these drugs, make sure to notify your treating physician. 3. Infection: This is more common in patients with a compromised immune system, either due to disease (example: diabetes, cancer, human immunodeficiency virus [HIV], etc.), or due to medications or treatments (example: therapies used to treat cancer and rheumatological diseases). However, even if you do not have one on these, it can still happen. If you have any of these conditions, or take one of these  drugs, make  sure to notify your treating physician. 4. Nerve Damage: This is more common when the treatment is an invasive one, but it can also happen with the use of medications, such as those used in the treatment of cancer. The damage can occur to small secondary nerves, or to large primary ones, such as those in the spinal cord and brain. This damage may be temporary or permanent and it may lead to impairments that can range from temporary numbness to permanent paralysis and/or brain death. 5. Allergic Reactions: Any time a substance or material comes in contact with our body, there is the possibility of an allergic reaction. These can range from a mild skin rash (contact dermatitis) to a severe systemic reaction (anaphylactic reaction), which can result in death. 6. Death: In general, any medical intervention can result in death, most of the time due to an unforeseen complication. ____________________________________________________________________________________________

## 2020-01-03 ENCOUNTER — Telehealth: Payer: Self-pay | Admitting: *Deleted

## 2020-01-03 NOTE — Telephone Encounter (Signed)
Sw pt and he stated that he did not know anything about a MRI. Pt refused the appt and requested to place this order on hold.                                      Thanks

## 2020-01-04 ENCOUNTER — Encounter: Payer: Self-pay | Admitting: Physical Therapy

## 2020-01-04 ENCOUNTER — Ambulatory Visit: Payer: No Typology Code available for payment source | Admitting: Physical Therapy

## 2020-01-04 ENCOUNTER — Other Ambulatory Visit: Payer: Self-pay

## 2020-01-04 DIAGNOSIS — M62838 Other muscle spasm: Secondary | ICD-10-CM

## 2020-01-04 DIAGNOSIS — M542 Cervicalgia: Secondary | ICD-10-CM

## 2020-01-04 NOTE — Therapy (Signed)
[facility] [address] [place], [place], [address] Phone: [phone]   Fax:  [phone]  Physical Therapy Treatment  Patient Details  Name: Zachary Little MRN: [id number] Date of Birth: 1981/10/23 Referring Provider (PT): Charlann Boxer   Encounter Date: 01/04/2020  PT End of Session - 01/04/20 1315    Visit Number  4    Number of Visits  12    Date for PT Re-Evaluation  02/02/20    Authorization Type  [facility]    PT Start Time  1307    PT Stop Time  1348    PT Time Calculation (min)  41 min    Activity Tolerance  Patient tolerated treatment well    Behavior During Therapy  [facility] for tasks assessed/performed       Past Medical History:  Diagnosis Date  . Anxiety   . Blood transfusion without reported diagnosis 2010   During Chemo Treatments  . 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 Thyroid disease     Past Surgical History:  Procedure Laterality Date  . PORT-A-CATH REMOVAL      There were no vitals filed for this visit.  Subjective Assessment - 01/04/20 1314    Subjective  States leg is feeling a bit better, UT region still sore with full L and R rotation of neck.    Pain Score  4    Pain Location  Neck    Pain Orientation  Right    Pain Descriptors / Indicators  Aching;Tightness    Pain Type  Chronic pain    Pain Onset  More than a month ago    Pain Frequency  Intermittent                       [facility] Adult PT Treatment/Exercise - 01/04/20 1318      Posture/Postural Control   Posture Comments  Flexible posture, rounded shoulders.       Exercises   Exercises  Knee/Hip      Knee/Hip Exercises: Stretches   Sports administrator  --    Sports administrator Limitations  --    Piriformis Stretch  --    Piriformis Stretch Limitations  --    Other Knee/Hip Stretches  SKTC 30 sec x2 Bil ;  Hip IR stretch supine: 30 sec x2 bil;     Other Knee/Hip Stretches  HIp flexor stretch kneeling 30 sec x 2 bil;   Off side of table 30 sec x2 il;       Knee/Hip Exercises: Aerobic   Stationary Bike  L3 x 8 min;       Knee/Hip Exercises: Standing   Forward Step Up  --    Forward Step Up Limitations  --      Knee/Hip Exercises: Seated   Long Arc Quad  --    Long Arc [employer] Limitations  --      Knee/Hip Exercises: Supine   Bridges  20 reps    Bridges with [employer]  --    Straight Leg Raises  --      Knee/Hip Exercises: Sidelying   Clams  --      Modalities   Modalities  Moist Heat      Moist Heat Therapy   Moist Heat Location  Cervical      Manual Therapy   Manual Therapy  Joint mobilization;Soft tissue mobilization;Manual Traction    Manual therapy comments  skilled palpation and monitoring  of soft tissue with dry needling.     Joint Mobilization  Post and INF hip mobs bil;  gr 3; hip distraction bil;     Soft tissue mobilization  DTM to R rhomboid and levator     Manual Traction  --       Trigger Point Dry Needling - 01/04/20 0001    Consent Given?  Yes    Education Handout Provided  Previously provided    Muscles Treated Head and Neck  Levator scapulae    Muscles Treated Upper Quadrant  Rhomboids    Levator Scapulae Response  Palpable increased muscle length    Rhomboids Response  Twitch response elicited;Palpable increased muscle length             PT Short Term Goals - 12/22/19 1042      PT SHORT TERM GOAL #1   Title  Pt to be independent with initial HEP     Time  2    Period  Weeks    Status  New    Target Date  01/05/20      PT SHORT TERM GOAL #2   Title  Pt to report decreased pain in neck region to 0-3/10    Time  2    Period  Weeks    Status  New    Target Date  01/05/20        PT Long Term Goals - 12/22/19 1043      PT LONG TERM GOAL #1   Title  Pt to report decreased pain to 0-1/10 in neck and quad    Time  6    Period  Weeks    Status  New    Target Date  02/02/20      PT LONG TERM GOAL #2   Title  Pt to demo improved cervical ROM, to  be WNL and pain free, to improve ability for head turns while driving.     Time  6    Period  Weeks    Status  New    Target Date  02/02/20      PT LONG TERM GOAL #3   Title  Pt to demo ability for walking up to 1 mi, stairs, and squats without pain in L quad, to incease ability for exercise.    Time  6    Period  Weeks    Status  New    Target Date  02/02/20      PT LONG TERM GOAL #4   Title  Pt to be independent with final HEP , for postural strengthening and LE strenthenign.    Time  6    Period  Weeks    Status  New    Target Date  02/02/20            Plan - 01/04/20 1550    Clinical Impression Statement  Pt with trigger points at rhomboid and levator, addressed with DTM and DN today, good twitch response in rhomboid. Pt educated on hip flexor stretching , HEP updated to include. Hip distraction and mobilization done bilaterally to incease flexion and IR, both limited. Plan to progress as tolerated. PT with imrpoved LE pain today.    Examination-Activity Limitations  Locomotion Level;Sit;Squat;Stairs    Examination-Participation Restrictions  Meal Prep;Cleaning;Community Activity    Stability/Clinical Decision Making  Stable/Uncomplicated    Rehab Potential  Good    PT Frequency  2x / week    PT Duration  6 weeks  PT Treatment/Interventions  ADLs/Self Care Home Management;Cryotherapy;Electrical Stimulation;Ultrasound;Traction;Moist Heat;Iontophoresis 4mg /ml Dexamethasone;Gait training;Stair training;Functional mobility training;Therapeutic activities;Therapeutic exercise;Balance training;Neuromuscular re-education;Manual techniques;Patient/family education;Passive range of motion;Dry needling;Joint Manipulations;Spinal Manipulations;Taping    Consulted and Agree with Plan of Care  Patient       Patient will benefit from skilled therapeutic intervention in order to improve the following deficits and impairments:  Increased muscle spasms, Decreased activity tolerance,  Pain, Impaired flexibility, Decreased strength, Improper body mechanics  Visit Diagnosis: Cervicalgia  Other muscle spasm     Problem List Patient Active Problem List   Diagnosis Date Noted  . Chronic pain syndrome 12/29/2019  . Neuropathic pain of thigh, right 12/29/2019  . Lateral femoral cutaneous entrapment syndrome, right 12/29/2019  . 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 Neuropathic pain involving lateral femoral cutaneous nerve, right 12/29/2019  . Nonallopathic lesion of thoracic region 11/16/2019  . Nonallopathic lesion of cervical region 11/16/2019  . Nonallopathic lesion of rib cage 11/16/2019  . Trigger point of right shoulder region 11/16/2019  . Scapular dyskinesis 10/06/2019  . Quadriceps strain, left, initial encounter 10/06/2019  . Depression, major, single episode, complete remission ([place]) 03/09/2018  . Erectile dysfunction 03/09/2018  . Obesity 03/09/2018  . Abnormal gait 01/02/2018  . 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 Osteoarthritis of spine 11/26/2017  . Knee pain 10/31/2017  . Thoracic spondylosis 10/28/2017  . Thoracic radiculopathy 05/23/2017  . Meralgia paresthetica of right side 05/23/2017  . AML (acute myeloid leukemia) in remission ([place]) 05/16/2015  . Hypothyroidism (acquired) 05/16/2015  . Vitamin B12 deficiency 10/14/2011    Lyndee Hensen, PT, DPT 3:53 PM  01/04/20    [facility] [place], [place], [address] Phone: [phone]   Fax:  [phone]  Name: Zachary Little MRN: [id number] Date of Birth: 1981/02/19

## 2020-01-04 NOTE — Patient Instructions (Signed)
Access Code: [email or web address]  URL: [email or web address]  Date: 01/04/2020  Prepared by: Lyndee Hensen   Exercises Supine Single Knee to Chest - 3 reps - 30 hold - 2x daily Prone Quadriceps Stretch with Strap - 3 reps - 30 hold - 2x daily Seated Hamstring Stretch - 3 reps - 30 hold - 2x daily Supine Piriformis Stretch with Leg Straight - 3 reps - 2 sets - 30 hold - 2x daily Modified Thomas Stretch - 3 reps - 30 hold - 2x daily Half Kneeling Hip Flexor Stretch - 3 reps - 30 hold - 2x daily Supine Bridge - 10 reps - 2 sets - 1x daily Scapular Retraction with Resistance - 10 reps - 2 sets - 1x daily Standing Shoulder Horizontal Abduction with Resistance - 10 reps - 2 sets - 1x daily Shoulder External Rotation and Scapular Retraction with Resistance - 10 reps - 2 sets - 1x daily

## 2020-01-06 ENCOUNTER — Encounter: Payer: Self-pay | Admitting: Physical Therapy

## 2020-01-06 ENCOUNTER — Other Ambulatory Visit: Payer: Self-pay

## 2020-01-06 ENCOUNTER — Ambulatory Visit: Payer: No Typology Code available for payment source | Admitting: Physical Therapy

## 2020-01-06 DIAGNOSIS — M62838 Other muscle spasm: Secondary | ICD-10-CM | POA: Diagnosis not present

## 2020-01-06 DIAGNOSIS — M79652 Pain in left thigh: Secondary | ICD-10-CM | POA: Diagnosis not present

## 2020-01-06 DIAGNOSIS — M542 Cervicalgia: Secondary | ICD-10-CM

## 2020-01-06 NOTE — Therapy (Signed)
[facility] [address] [place], [place], [address] Phone: [phone]   Fax:  [phone]  Physical Therapy Treatment  Patient Details  Name: Zachary Little MRN: [id number] Date of Birth: 23-Feb-1981 Referring Provider (PT): Charlann Boxer   Encounter Date: 01/06/2020  PT End of Session - 01/06/20 1311    Visit Number  5    Number of Visits  12    Date for PT Re-Evaluation  02/02/20    Authorization Type  [facility]    PT Start Time  1302    PT Stop Time  1345    PT Time Calculation (min)  43 min    Activity Tolerance  Patient tolerated treatment well    Behavior During Therapy  [facility] for tasks assessed/performed       Past Medical History:  Diagnosis Date  . Anxiety   . Blood transfusion without reported diagnosis 2010   During Chemo Treatments  . 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 Thyroid disease     Past Surgical History:  Procedure Laterality Date  . PORT-A-CATH REMOVAL      There were no vitals filed for this visit.  Subjective Assessment - 01/06/20 1310    Subjective  Pt states leg is feeling pretty good. Neck is sore, but feels his ROM is better yesterday.    Currently in Pain?  Yes    Pain Score  1     Pain Location  Leg    Pain Orientation  Left    Pain Descriptors / Indicators  Aching    Pain Score  2    Pain Location  Neck    Pain Orientation  Right    Pain Descriptors / Indicators  Aching    Pain Type  Chronic pain    Pain Onset  More than a month ago    Pain Frequency  Intermittent                       [facility] Adult PT Treatment/Exercise - 01/06/20 1312      Posture/Postural Control   Posture Comments  Flexible posture, rounded shoulders.       Exercises   Exercises  Knee/Hip      Knee/Hip Exercises: Stretches   Other Knee/Hip Stretches   Hip IR stretch supine: 30 sec x2 bil;     Other Knee/Hip Stretches   Off side of table 30 sec x2 il;       Knee/Hip  Exercises: Aerobic   Stationary Bike  L2 x 8 min;       Knee/Hip Exercises: Standing   Hip Abduction  2 sets;10 reps    Abduction Limitations  YTB      Knee/Hip Exercises: Seated   Long Arc Quad  20 reps    Long Arc Quad Limitations  2.5 lb      Knee/Hip Exercises: Supine   Bridges  --      Knee/Hip Exercises: Sidelying   Hip ABduction  20 reps;Both      Modalities   Modalities  Moist Heat      Moist Heat Therapy   Moist Heat Location  --      Manual Therapy   Manual Therapy  Joint mobilization;Soft tissue mobilization;Manual Traction;Passive ROM    Manual therapy comments  skilled palpation and monitoring of soft tissue with dry needling.     Joint Mobilization  Post and INF hip mobs bil;  gr 3;  Soft tissue mobilization  DTM and IASTM to L quad     Passive ROM  Prone, hip IR contract/relax stretching bil                PT Short Term Goals - 12/22/19 1042      PT SHORT TERM GOAL #1   Title  Pt to be independent with initial HEP     Time  2    Period  Weeks    Status  New    Target Date  01/05/20      PT SHORT TERM GOAL #2   Title  Pt to report decreased pain in neck region to 0-3/10    Time  2    Period  Weeks    Status  New    Target Date  01/05/20        PT Long Term Goals - 12/22/19 1043      PT LONG TERM GOAL #1   Title  Pt to report decreased pain to 0-1/10 in neck and quad    Time  6    Period  Weeks    Status  New    Target Date  02/02/20      PT LONG TERM GOAL #2   Title  Pt to demo improved cervical ROM, to be WNL and pain free, to improve ability for head turns while driving.     Time  6    Period  Weeks    Status  New    Target Date  02/02/20      PT LONG TERM GOAL #3   Title  Pt to demo ability for walking up to 1 mi, stairs, and squats without pain in L quad, to incease ability for exercise.    Time  6    Period  Weeks    Status  New    Target Date  02/02/20      PT LONG TERM GOAL #4   Title  Pt to be independent with  final HEP , for postural strengthening and LE strenthenign.    Time  6    Period  Weeks    Status  New    Target Date  02/02/20            Plan - 01/06/20 1454    Clinical Impression Statement  Pt with decreased soreness in L quad today with DTM and palpation from previous sessions, seems to be progressing well. Continued ther ex for hip and LE strength. Manual continued for hip mobilization and ROM, pt with improving hip flexion ROM, still limited with IR bil.    Examination-Activity Limitations  Locomotion Level;Sit;Squat;Stairs    Examination-Participation Restrictions  Meal Prep;Cleaning;Community Activity    Stability/Clinical Decision Making  Stable/Uncomplicated    Rehab Potential  Good    PT Frequency  2x / week    PT Duration  6 weeks    PT Treatment/Interventions  ADLs/Self Care Home Management;Cryotherapy;Electrical Stimulation;Ultrasound;Traction;Moist Heat;Iontophoresis 4mg /ml Dexamethasone;Gait training;Stair training;Functional mobility training;Therapeutic activities;Therapeutic exercise;Balance training;Neuromuscular re-education;Manual techniques;Patient/family education;Passive range of motion;Dry needling;Joint Manipulations;Spinal Manipulations;Taping    Consulted and Agree with Plan of Care  Patient       Patient will benefit from skilled therapeutic intervention in order to improve the following deficits and impairments:  Increased muscle spasms, Decreased activity tolerance, Pain, Impaired flexibility, Decreased strength, Improper body mechanics  Visit Diagnosis: Cervicalgia  Other muscle spasm  Pain in left thigh     Problem List Patient Active Problem List  Diagnosis Date Noted  . Chronic pain syndrome 12/29/2019  . Neuropathic pain of thigh, right 12/29/2019  . Lateral femoral cutaneous entrapment syndrome, right 12/29/2019  . 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 Neuropathic pain involving lateral femoral cutaneous nerve,  right 12/29/2019  . Nonallopathic lesion of thoracic region 11/16/2019  . Nonallopathic lesion of cervical region 11/16/2019  . Nonallopathic lesion of rib cage 11/16/2019  . Trigger point of right shoulder region 11/16/2019  . Scapular dyskinesis 10/06/2019  . Quadriceps strain, left, initial encounter 10/06/2019  . Depression, major, single episode, complete remission ([place]) 03/09/2018  . Erectile dysfunction 03/09/2018  . Obesity 03/09/2018  . Abnormal gait 01/02/2018  . 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 Osteoarthritis of spine 11/26/2017  . Knee pain 10/31/2017  . Thoracic spondylosis 10/28/2017  . Thoracic radiculopathy 05/23/2017  . Meralgia paresthetica of right side 05/23/2017  . AML (acute myeloid leukemia) in remission ([place]) 05/16/2015  . Hypothyroidism (acquired) 05/16/2015  . Vitamin B12 deficiency 10/14/2011    Lyndee Hensen, PT, DPT 2:56 PM  01/06/20    [facility] [place], [place], [address] Phone: [phone]   Fax:  [phone]  Name: Benhamin Damboise MRN: [id number] Date of Birth: 07/15/1981

## 2020-01-11 ENCOUNTER — Other Ambulatory Visit: Payer: Self-pay

## 2020-01-11 ENCOUNTER — Ambulatory Visit: Payer: No Typology Code available for payment source | Admitting: Physical Therapy

## 2020-01-11 DIAGNOSIS — M62838 Other muscle spasm: Secondary | ICD-10-CM

## 2020-01-11 DIAGNOSIS — M542 Cervicalgia: Secondary | ICD-10-CM

## 2020-01-11 DIAGNOSIS — M79652 Pain in left thigh: Secondary | ICD-10-CM | POA: Diagnosis not present

## 2020-01-12 ENCOUNTER — Encounter: Payer: Self-pay | Admitting: Physical Therapy

## 2020-01-12 NOTE — Therapy (Signed)
[facility] [address] [place], [place], [address] Phone: [phone]   Fax:  [phone]  Physical Therapy Treatment  Patient Details  Name: Zachary Little MRN: [id number] Date of Birth: 23-May-1981 Referring Provider (PT): Charlann Boxer   Encounter Date: 01/11/2020  PT End of Session - 01/12/20 1325    Visit Number  6    Number of Visits  12    Date for PT Re-Evaluation  02/02/20    Authorization Type  [facility]    PT Start Time  1302    PT Stop Time  1343    PT Time Calculation (min)  41 min    Activity Tolerance  Patient tolerated treatment well    Behavior During Therapy  [facility] for tasks assessed/performed       Past Medical History:  Diagnosis Date  . Anxiety   . Blood transfusion without reported diagnosis 2010   During Chemo Treatments  . 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 Thyroid disease     Past Surgical History:  Procedure Laterality Date  . PORT-A-CATH REMOVAL      There were no vitals filed for this visit.  Subjective Assessment - 01/12/20 1316    Subjective  Pt states minimal pain in quad/LE. R UT region still mildly sore    Currently in Pain?  Yes    Pain Score  0-No pain    Pain Location  Leg    Pain Score  2    Pain Location  Neck    Pain Orientation  Right    Pain Descriptors / Indicators  Aching    Pain Onset  More than a month ago    Pain Frequency  Intermittent                       [facility] Adult PT Treatment/Exercise - 01/12/20 0001      Exercises   Exercises  Neck      Neck Exercises: Machines for Strengthening   UBE (Upper Arm Bike)  4 min, fwd/bwd      Neck Exercises: Theraband   Rows  20 reps;Green    Shoulder External Rotation  20 reps;Red    Horizontal ABduction  20 reps;Red      Neck Exercises: Standing   Other Standing Exercises  Flexion/[place] AROM x10; Abd x10;       Neck Exercises: Supine   Other Supine Exercise  Shoulder FLexion AROM  x15: Horizontal Abd x15 arom;       Knee/Hip Exercises: Aerobic   Other Aerobic  UBE 4 min, fwd/bwd       Manual Therapy   Manual therapy comments  skilled palpation and monitoring of soft tissue with dry needling.     Soft tissue mobilization  DTM to R UT, levator, Rhomboid    Passive ROM  Prone, hip IR contract/relax stretching bil , PROM for R shoulder  flexion    Manual Traction  10 sec x10  cervical      Neck Exercises: Stretches   Upper Trapezius Stretch  2 reps;30 seconds       Trigger Point Dry Needling - 01/12/20 0001    Consent Given?  Yes    Education Handout Provided  Previously provided    Muscles Treated Head and Neck  Levator scapulae    Muscles Treated Upper Quadrant  Rhomboids    Levator Scapulae Response  Twitch response elicited;Palpable increased muscle length  R   Rhomboids Response  Twitch response elicited;Palpable increased muscle length   R            PT Short Term Goals - 12/22/19 1042      PT SHORT TERM GOAL #1   Title  Pt to be independent with initial HEP     Time  2    Period  Weeks    Status  New    Target Date  01/05/20      PT SHORT TERM GOAL #2   Title  Pt to report decreased pain in neck region to 0-3/10    Time  2    Period  Weeks    Status  New    Target Date  01/05/20        PT Long Term Goals - 12/22/19 1043      PT LONG TERM GOAL #1   Title  Pt to report decreased pain to 0-1/10 in neck and quad    Time  6    Period  Weeks    Status  New    Target Date  02/02/20      PT LONG TERM GOAL #2   Title  Pt to demo improved cervical ROM, to be WNL and pain free, to improve ability for head turns while driving.     Time  6    Period  Weeks    Status  New    Target Date  02/02/20      PT LONG TERM GOAL #3   Title  Pt to demo ability for walking up to 1 mi, stairs, and squats without pain in L quad, to incease ability for exercise.    Time  6    Period  Weeks    Status  New    Target Date  02/02/20      PT LONG  TERM GOAL #4   Title  Pt to be independent with final HEP , for postural strengthening and LE strenthenign.    Time  6    Period  Weeks    Status  New    Target Date  02/02/20            Plan - 01/12/20 1327    Clinical Impression Statement  Pt with good twitch response with DN today, in rhomboid/levator region. Ther ex progressed for UE/postural strengthening. Pt to benefit from continued care, making good progress.    Examination-Activity Limitations  Locomotion Level;Sit;Squat;Stairs    Examination-Participation Restrictions  Meal Prep;Cleaning;Community Activity    Stability/Clinical Decision Making  Stable/Uncomplicated    Rehab Potential  Good    PT Frequency  2x / week    PT Duration  6 weeks    PT Treatment/Interventions  ADLs/Self Care Home Management;Cryotherapy;Electrical Stimulation;Ultrasound;Traction;Moist Heat;Iontophoresis 4mg /ml Dexamethasone;Gait training;Stair training;Functional mobility training;Therapeutic activities;Therapeutic exercise;Balance training;Neuromuscular re-education;Manual techniques;Patient/family education;Passive range of motion;Dry needling;Joint Manipulations;Spinal Manipulations;Taping    Consulted and Agree with Plan of Care  Patient       Patient will benefit from skilled therapeutic intervention in order to improve the following deficits and impairments:  Increased muscle spasms, Decreased activity tolerance, Pain, Impaired flexibility, Decreased strength, Improper body mechanics  Visit Diagnosis: Cervicalgia  Other muscle spasm  Pain in left thigh     Problem List Patient Active Problem List   Diagnosis Date Noted  . Chronic pain syndrome 12/29/2019  . Neuropathic pain of thigh, right 12/29/2019  . Lateral femoral cutaneous entrapment syndrome, right 12/29/2019  . Lateral femoral  cutaneous neuropathy, right 12/29/2019  . Neuropathic pain involving lateral femoral cutaneous nerve, right 12/29/2019  . Nonallopathic lesion of  thoracic region 11/16/2019  . Nonallopathic lesion of cervical region 11/16/2019  . Nonallopathic lesion of rib cage 11/16/2019  . Trigger point of right shoulder region 11/16/2019  . Scapular dyskinesis 10/06/2019  . Quadriceps strain, left, initial encounter 10/06/2019  . Depression, major, single episode, complete remission ([place]) 03/09/2018  . Erectile dysfunction 03/09/2018  . Obesity 03/09/2018  . Abnormal gait 01/02/2018  . 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 Osteoarthritis of spine 11/26/2017  . Knee pain 10/31/2017  . Thoracic spondylosis 10/28/2017  . Thoracic radiculopathy 05/23/2017  . Meralgia paresthetica of right side 05/23/2017  . AML (acute myeloid leukemia) in remission ([place]) 05/16/2015  . Hypothyroidism (acquired) 05/16/2015  . Vitamin B12 deficiency 10/14/2011    Lyndee Hensen, PT, DPT 1:29 PM  01/12/20    [facility] [place], [place], [address] Phone: [phone]   Fax:  [phone]  Name: Zachary Little MRN: [id number] Date of Birth: [birth year]

## 2020-01-13 ENCOUNTER — Encounter: Payer: No Typology Code available for payment source | Admitting: Physical Therapy

## 2020-01-18 ENCOUNTER — Ambulatory Visit: Payer: No Typology Code available for payment source | Admitting: Physical Therapy

## 2020-01-18 ENCOUNTER — Ambulatory Visit: Payer: No Typology Code available for payment source | Admitting: Neurology

## 2020-01-18 ENCOUNTER — Other Ambulatory Visit: Payer: Self-pay

## 2020-01-18 ENCOUNTER — Encounter: Payer: Self-pay | Admitting: Physical Therapy

## 2020-01-18 DIAGNOSIS — M62838 Other muscle spasm: Secondary | ICD-10-CM | POA: Diagnosis not present

## 2020-01-18 DIAGNOSIS — M79652 Pain in left thigh: Secondary | ICD-10-CM

## 2020-01-18 DIAGNOSIS — M542 Cervicalgia: Secondary | ICD-10-CM

## 2020-01-18 NOTE — Therapy (Signed)
[facility] [address] [place], [place], [address] Phone: [phone]   Fax:  [phone]  Physical Therapy Treatment  Patient Details  Name: Zachary Little MRN: [id number] Date of Birth: Dec 25, 1980 Referring Provider (PT): Charlann Boxer   Encounter Date: 01/18/2020  PT End of Session - 01/18/20 1545    Visit Number  7    Number of Visits  12    Date for PT Re-Evaluation  02/02/20    Authorization Type  [facility]    PT Start Time  [id number]    PT Stop Time  1350    PT Time Calculation (min)  45 min    Activity Tolerance  Patient tolerated treatment well    Behavior During Therapy  [facility] for tasks assessed/performed       Past Medical History:  Diagnosis Date  . Anxiety   . Blood transfusion without reported diagnosis 2010   During Chemo Treatments  . 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 Thyroid disease     Past Surgical History:  Procedure Laterality Date  . PORT-A-CATH REMOVAL      There were no vitals filed for this visit.  Subjective Assessment - 01/18/20 1543    Subjective  Pt states minimal pain in LEs .R upper trap region is sore with R cervical rotation. notes improved pain overall.    Currently in Pain?  No/denies    Pain Score  0-No pain    Pain Location  Leg    Pain Score  2    Pain Location  Neck    Pain Orientation  Right    Pain Descriptors / Indicators  Aching;Tightness;Spasm    Pain Type  Chronic pain    Pain Onset  More than a month ago    Pain Frequency  Intermittent    Aggravating Factors   R cervical rotation                       [facility] Adult PT Treatment/Exercise - 01/18/20 1313      Exercises   Exercises  Neck      Neck Exercises: Machines for Strengthening   UBE (Upper Arm Bike)  4 min, fwd/bwd      Neck Exercises: Theraband   Rows  20 reps;Green    Shoulder External Rotation  20 reps;Green    Horizontal ABduction  20 reps;Green    Other Theraband  Exercises  Low Row RTB x10      Neck Exercises: Standing   Other Standing Exercises  --    Other Standing Exercises  Wall push ups x20;   Low Row /squeeze x20;       Neck Exercises: Supine   Other Supine Exercise  --      Knee/Hip Exercises: Aerobic   Other Aerobic  --      Manual Therapy   Manual therapy comments  skilled palpation and monitoring of soft tissue with dry needling.     Joint Mobilization  Post and INF hip mobs bil;  gr 3;    Soft tissue mobilization  DTM to R UT, levator, Rhomboid    Passive ROM  for hip Flex and IR, bil     Manual Traction  --      Neck Exercises: Stretches   Upper Trapezius Stretch  --       Trigger Point Dry Needling - 01/18/20 0001    Consent Given?  Yes  Education Handout Provided  Previously provided    Muscles Treated Upper Quadrant  Rhomboids    Rhomboids Response  Twitch response elicited;Palpable increased muscle length   R            PT Short Term Goals - 12/22/19 1042      PT SHORT TERM GOAL #1   Title  Pt to be independent with initial HEP     Time  2    Period  Weeks    Status  New    Target Date  01/05/20      PT SHORT TERM GOAL #2   Title  Pt to report decreased pain in neck region to 0-3/10    Time  2    Period  Weeks    Status  New    Target Date  01/05/20        PT Long Term Goals - 12/22/19 1043      PT LONG TERM GOAL #1   Title  Pt to report decreased pain to 0-1/10 in neck and quad    Time  6    Period  Weeks    Status  New    Target Date  02/02/20      PT LONG TERM GOAL #2   Title  Pt to demo improved cervical ROM, to be WNL and pain free, to improve ability for head turns while driving.     Time  6    Period  Weeks    Status  New    Target Date  02/02/20      PT LONG TERM GOAL #3   Title  Pt to demo ability for walking up to 1 mi, stairs, and squats without pain in L quad, to incease ability for exercise.    Time  6    Period  Weeks    Status  New    Target Date  02/02/20       PT LONG TERM GOAL #4   Title  Pt to be independent with final HEP , for postural strengthening and LE strenthenign.    Time  6    Period  Weeks    Status  New    Target Date  02/02/20            Plan - 01/18/20 1546    Clinical Impression Statement  Thigh pain improving, but pt still with stiffness and lack of hip IR and flexion. Flexion improving on L, and pain imroving with sititng/work duties. Cervical ROM improving, pt with soreness only with end range R rotation, in rhomboid/levator region. Pt with good tolerance for DN, good twitch response today in rhomboid. Pt to benefit form continued care.    Examination-Activity Limitations  Locomotion Level;Sit;Squat;Stairs    Examination-Participation Restrictions  Meal Prep;Cleaning;Community Activity    Stability/Clinical Decision Making  Stable/Uncomplicated    Rehab Potential  Good    PT Frequency  2x / week    PT Duration  6 weeks    PT Treatment/Interventions  ADLs/Self Care Home Management;Cryotherapy;Electrical Stimulation;Ultrasound;Traction;Moist Heat;Iontophoresis 4mg /ml Dexamethasone;Gait training;Stair training;Functional mobility training;Therapeutic activities;Therapeutic exercise;Balance training;Neuromuscular re-education;Manual techniques;Patient/family education;Passive range of motion;Dry needling;Joint Manipulations;Spinal Manipulations;Taping    Consulted and Agree with Plan of Care  Patient       Patient will benefit from skilled therapeutic intervention in order to improve the following deficits and impairments:  Increased muscle spasms, Decreased activity tolerance, Pain, Impaired flexibility, Decreased strength, Improper body mechanics  Visit Diagnosis: Cervicalgia  Other muscle spasm  Pain in left thigh     Problem List Patient Active Problem List   Diagnosis Date Noted  . Chronic pain syndrome 12/29/2019  . Neuropathic pain of thigh, right 12/29/2019  . Lateral femoral cutaneous entrapment syndrome,  right 12/29/2019  . 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 Neuropathic pain involving lateral femoral cutaneous nerve, right 12/29/2019  . Nonallopathic lesion of thoracic region 11/16/2019  . Nonallopathic lesion of cervical region 11/16/2019  . Nonallopathic lesion of rib cage 11/16/2019  . Trigger point of right shoulder region 11/16/2019  . Scapular dyskinesis 10/06/2019  . Quadriceps strain, left, initial encounter 10/06/2019  . Depression, major, single episode, complete remission ([place]) 03/09/2018  . Erectile dysfunction 03/09/2018  . Obesity 03/09/2018  . Abnormal gait 01/02/2018  . 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 Osteoarthritis of spine 11/26/2017  . Knee pain 10/31/2017  . Thoracic spondylosis 10/28/2017  . Thoracic radiculopathy 05/23/2017  . Meralgia paresthetica of right side 05/23/2017  . 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 Hypothyroidism (acquired) 05/16/2015  . Vitamin B12 deficiency 10/14/2011    Lyndee Hensen, PT, DPT 3:48 PM  01/18/20    [facility] [place], [place], [address] Phone: [phone]   Fax:  [phone]  Name: Zachary Little MRN: [id number] Date of Birth: 1981/03/21

## 2020-01-20 ENCOUNTER — Encounter: Payer: Self-pay | Admitting: Physical Therapy

## 2020-01-20 ENCOUNTER — Ambulatory Visit: Payer: No Typology Code available for payment source | Admitting: Physical Therapy

## 2020-01-20 ENCOUNTER — Other Ambulatory Visit: Payer: Self-pay

## 2020-01-20 DIAGNOSIS — M542 Cervicalgia: Secondary | ICD-10-CM | POA: Diagnosis not present

## 2020-01-20 DIAGNOSIS — M62838 Other muscle spasm: Secondary | ICD-10-CM | POA: Diagnosis not present

## 2020-01-20 DIAGNOSIS — M79652 Pain in left thigh: Secondary | ICD-10-CM

## 2020-01-25 ENCOUNTER — Encounter: Payer: Self-pay | Admitting: Physical Therapy

## 2020-01-25 NOTE — Therapy (Signed)
[facility] [address] [place], [place], [address] Phone: [phone]   Fax:  [phone]  Physical Therapy Treatment  Patient Details  Name: Zachary Little MRN: [id number] Date of Birth: 05-06-1981 Referring Provider (PT): Charlann Boxer   Encounter Date: 01/20/2020  PT End of Session - 01/25/20 1539    Visit Number  8    Number of Visits  12    Date for PT Re-Evaluation  02/02/20    Authorization Type  [facility]    PT Start Time  1220    PT Stop Time  1300    PT Time Calculation (min)  40 min    Activity Tolerance  Patient tolerated treatment well    Behavior During Therapy  [facility] for tasks assessed/performed       Past Medical History:  Diagnosis Date  . Anxiety   . Blood transfusion without reported diagnosis 2010   During Chemo Treatments  . 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 Thyroid disease     Past Surgical History:  Procedure Laterality Date  . PORT-A-CATH REMOVAL      There were no vitals filed for this visit.  Subjective Assessment - 01/25/20 1539    Subjective  Pt states less pain in R shoulder blade/ UT    Currently in Pain?  Yes    Pain Score  1     Pain Location  Shoulder    Pain Orientation  Right    Pain Descriptors / Indicators  Aching                       [facility] Adult PT Treatment/Exercise - 01/25/20 0001      Exercises   Exercises  Neck      Neck Exercises: Machines for Strengthening   UBE (Upper Arm Bike)  4 min, fwd/bwd      Neck Exercises: Theraband   Rows  20 reps;Blue    Shoulder External Rotation  20 reps;Green    Horizontal ABduction  20 reps;Green      Neck Exercises: Standing   Other Standing Exercises  Wall push ups x20;  High plank 30 sec x3;       Neck Exercises: Supine   Other Supine Exercise  Chest press 10lb bil (review for gym)      Neck Exercises: Prone   Other Prone Exercise  Prone I and T on small ball x20 each;       Knee/Hip  Exercises: Stretches   Hip Flexor Stretch  2 reps;30 seconds    Hip Flexor Stretch Limitations  kneeling       Knee/Hip Exercises: Standing   Functional Squat  20 reps    Functional Squat Limitations  15 lb       Manual Therapy   Soft tissue mobilization  DTM to R UT, levator, Rhomboid    Manual Traction  10 sec x10  cervical               PT Short Term Goals - 12/22/19 1042      PT SHORT TERM GOAL #1   Title  Pt to be independent with initial HEP     Time  2    Period  Weeks    Status  New    Target Date  01/05/20      PT SHORT TERM GOAL #2   Title  Pt to report decreased pain in neck region to 0-3/10  Time  2    Period  Weeks    Status  New    Target Date  01/05/20        PT Long Term Goals - 12/22/19 1043      PT LONG TERM GOAL #1   Title  Pt to report decreased pain to 0-1/10 in neck and quad    Time  6    Period  Weeks    Status  New    Target Date  02/02/20      PT LONG TERM GOAL #2   Title  Pt to demo improved cervical ROM, to be WNL and pain free, to improve ability for head turns while driving.     Time  6    Period  Weeks    Status  New    Target Date  02/02/20      PT LONG TERM GOAL #3   Title  Pt to demo ability for walking up to 1 mi, stairs, and squats without pain in L quad, to incease ability for exercise.    Time  6    Period  Weeks    Status  New    Target Date  02/02/20      PT LONG TERM GOAL #4   Title  Pt to be independent with final HEP , for postural strengthening and LE strenthenign.    Time  6    Period  Weeks    Status  New    Target Date  02/02/20            Plan - 01/25/20 1540    Clinical Impression Statement  Palpable trigger point at levator, much less sore and taught than previous sessions. Pt with improving pain with ROM, states still "feels it" with full R rotation. Pt responding well to manual for this. L quad pain doing well, seems to be resolved. Pt to benefit from continued care, will benefit form  continued pain relief for R shoulder blade region, as well as strenghtening and mobiltiy for hips, pt stil quite limited with hip mobility, and has significant weakness in core.    Examination-Activity Limitations  Locomotion Level;Sit;Squat;Stairs    Examination-Participation Restrictions  Meal Prep;Cleaning;Community Activity    Stability/Clinical Decision Making  Stable/Uncomplicated    Rehab Potential  Good    PT Frequency  2x / week    PT Duration  6 weeks    PT Treatment/Interventions  ADLs/Self Care Home Management;Cryotherapy;Electrical Stimulation;Ultrasound;Traction;Moist Heat;Iontophoresis 4mg /ml Dexamethasone;Gait training;Stair training;Functional mobility training;Therapeutic activities;Therapeutic exercise;Balance training;Neuromuscular re-education;Manual techniques;Patient/family education;Passive range of motion;Dry needling;Joint Manipulations;Spinal Manipulations;Taping    Consulted and Agree with Plan of Care  Patient       Patient will benefit from skilled therapeutic intervention in order to improve the following deficits and impairments:  Increased muscle spasms, Decreased activity tolerance, Pain, Impaired flexibility, Decreased strength, Improper body mechanics  Visit Diagnosis: Cervicalgia  Other muscle spasm  Pain in left thigh     Problem List Patient Active Problem List   Diagnosis Date Noted  . Chronic pain syndrome 12/29/2019  . Neuropathic pain of thigh, right 12/29/2019  . Lateral femoral cutaneous entrapment syndrome, right 12/29/2019  . 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 Neuropathic pain involving lateral femoral cutaneous nerve, right 12/29/2019  . Nonallopathic lesion of thoracic region 11/16/2019  . Nonallopathic lesion of cervical region 11/16/2019  . Nonallopathic lesion of rib cage 11/16/2019  . Trigger point of right shoulder region 11/16/2019  . Scapular dyskinesis  10/06/2019  . Quadriceps strain, left, initial  encounter 10/06/2019  . Depression, major, single episode, complete remission ([place]) 03/09/2018  . Erectile dysfunction 03/09/2018  . Obesity 03/09/2018  . Abnormal gait 01/02/2018  . 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 Osteoarthritis of spine 11/26/2017  . Knee pain 10/31/2017  . Thoracic spondylosis 10/28/2017  . Thoracic radiculopathy 05/23/2017  . Meralgia paresthetica of right side 05/23/2017  . AML (acute myeloid leukemia) in remission ([place]) 05/16/2015  . Hypothyroidism (acquired) 05/16/2015  . Vitamin B12 deficiency 10/14/2011    Lyndee Hensen, PT, DPT 3:42 PM  01/25/20    [facility] [place], [place], [address] Phone: [phone]   Fax:  [phone]  Name: Zachary Little MRN: [id number] Date of Birth: [birth year]

## 2020-01-26 ENCOUNTER — Other Ambulatory Visit: Payer: Self-pay

## 2020-01-26 ENCOUNTER — Encounter: Payer: Self-pay | Admitting: Family Medicine

## 2020-01-26 ENCOUNTER — Ambulatory Visit: Payer: No Typology Code available for payment source | Admitting: Family Medicine

## 2020-01-26 ENCOUNTER — Ambulatory Visit (INDEPENDENT_AMBULATORY_CARE_PROVIDER_SITE_OTHER): Payer: No Typology Code available for payment source

## 2020-01-26 VITALS — BP 100/80 | HR 82 | Ht 68.0 in | Wt 192.0 lb

## 2020-01-26 DIAGNOSIS — C9201 Acute myeloblastic leukemia, in remission: Secondary | ICD-10-CM

## 2020-01-26 DIAGNOSIS — M545 Low back pain, unspecified: Secondary | ICD-10-CM

## 2020-01-26 DIAGNOSIS — G8929 Other chronic pain: Secondary | ICD-10-CM

## 2020-01-26 DIAGNOSIS — M546 Pain in thoracic spine: Secondary | ICD-10-CM

## 2020-01-26 DIAGNOSIS — M2550 Pain in unspecified joint: Secondary | ICD-10-CM

## 2020-01-26 LAB — CBC WITH DIFFERENTIAL/PLATELET
Basophils Absolute: 0 10*3/uL (ref 0.0–0.1)
Basophils Relative: 0.6 % (ref 0.0–3.0)
Eosinophils Absolute: 0.1 10*3/uL (ref 0.0–0.7)
Eosinophils Relative: 1.8 % (ref 0.0–5.0)
HCT: 46.7 % (ref 39.0–52.0)
Hemoglobin: 16.3 g/dL (ref 13.0–17.0)
Lymphocytes Relative: 28.2 % (ref 12.0–46.0)
Lymphs Abs: 1.5 10*3/uL (ref 0.7–4.0)
MCHC: 34.9 g/dL (ref 30.0–36.0)
MCV: 94.2 fl (ref 78.0–100.0)
Monocytes Absolute: 0.7 10*3/uL (ref 0.1–1.0)
Monocytes Relative: 12.8 % — ABNORMAL HIGH (ref 3.0–12.0)
Neutro Abs: 3 10*3/uL (ref 1.4–7.7)
Neutrophils Relative %: 56.6 % (ref 43.0–77.0)
Platelets: 182 10*3/uL (ref 150.0–400.0)
RBC: 4.96 Mil/uL (ref 4.22–5.81)
RDW: 13.3 % (ref 11.5–15.5)
WBC: 5.4 10*3/uL (ref 4.0–10.5)

## 2020-01-26 LAB — FERRITIN: Ferritin: 504.3 ng/mL — ABNORMAL HIGH (ref 22.0–322.0)

## 2020-01-26 LAB — IBC PANEL
Iron: 62 ug/dL (ref 42–165)
Saturation Ratios: 19.8 % — ABNORMAL LOW (ref 20.0–50.0)
Transferrin: 224 mg/dL (ref 212.0–360.0)

## 2020-01-26 NOTE — Patient Instructions (Addendum)
Recheck labs today Lumbar xray today  After labs we will see leaning towards MRI abdomen pelvis Lets hold on follow up

## 2020-01-26 NOTE — Progress Notes (Signed)
I have reviewed and agreed above plan.  It is okay to continue follow-up with pain management, only return to our clinic for new issues.

## 2020-01-26 NOTE — Progress Notes (Signed)
[facility] [address] [place] Phone: [phone] Subjective:   I Zachary Little am serving as a Education administrator for Dr. Hulan Saas.  This visit occurred during the SARS-CoV-2 public health emergency.  Safety protocols were in place, including screening questions prior to the visit, additional usage of staff PPE, and extensive cleaning of exam room while observing appropriate contact time as indicated for disinfecting solutions.   I'm seeing this patient by the request  of:  [name], [place]  CC:    [id number]   12/15/2019 Overall relatively well.  Discussed with patient that more like him to start with formal physical therapy for this and scapular dyskinesis.  Attempted osteopathic manipulation with minimal benefit again.  Discussed icing regimen.  Discussed ergonomics throughout the day that make a big difference.  Follow-up with me again in 6 weeks  OMT.  01/26/2020 Zachary Little is a 39 y.o. male coming in with complaint of back pain. Patient states he is doing somewhat better. Going to PT for his neck and legs. Would like to get his ferritin rechecked.  Continues to have chronic pain is no longer responding well to the physical therapy or the manipulation.  Feels like he has had pain for quite some time.  Has had significant work-up for this previously.  Patient has had leukemia in the past status post chemo treatments.      Past Medical History:  Diagnosis Date  . Anxiety   . Blood transfusion without reported diagnosis 2010   During Chemo Treatments  . 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 Thyroid disease    Past Surgical History:  Procedure Laterality Date  . PORT-A-CATH REMOVAL     Social History   Socioeconomic History  . Marital status: Single    Spouse name: Not on file  . Number of children: 0  . Years of education: college  . Highest education level: Not on file  Occupational History  .  Occupation: Disabled  Tobacco Use  . Smoking status: Never Smoker  . Smokeless tobacco: Never Used  Substance and Sexual Activity  . Alcohol use: Yes    Comment: "maybe one drink every two years"  . Drug use: No  . Sexual activity: Yes    Partners: Female  Other Topics Concern  . Not on file  Social History Narrative   Lives at home with his wife.   Left-handed.   No daily caffeine use.    Social Determinants of Health   Financial Resource Strain:   . Difficulty of Paying Living Expenses: Not on file  Food Insecurity:   . Worried About Charity fundraiser in the Last Year: Not on file  . Ran Out of Food in the Last Year: Not on file  Transportation Needs:   . Lack of Transportation (Medical): Not on file  . Lack of Transportation (Non-Medical): Not on file  Physical Activity:   . Days of Exercise per Week: Not on file  . Minutes of Exercise per Session: Not on file  Stress:   . Feeling of Stress : Not on file  Social Connections:   . Frequency of Communication with Friends and Family: Not on file  . Frequency of Social Gatherings with Friends and Family: Not on file  . Attends Religious Services: Not on file  . Active Member of Clubs or Organizations: Not on file  . Attends Archivist Meetings: Not on file  .  Marital Status: Not on file   No Known Allergies Family History  Problem Relation Age of Onset  . Healthy Mother   . Healthy Father   . Multiple sclerosis Maternal Grandmother   . Diabetes Maternal Grandfather   . Diabetes Paternal Grandfather   . Prostate cancer Neg Hx   . Colon cancer Neg Hx     Current Outpatient Medications (Endocrine & Metabolic):  .  levothyroxine (SYNTHROID) 150 MCG tablet, TAKE 1 TABLET BY MOUTH DAILY.      Current Outpatient Medications (Other):  Marland Kitchen  Cholecalciferol (VITAMIN D-3) 125 MCG (5000 UT) TABS, Take 1 tablet by mouth daily.   Reviewed prior external information including notes and imaging from  primary  care provider As well as notes that were available from care everywhere and other healthcare systems.  Past medical history, social, surgical and family history all reviewed in electronic medical record.  No pertanent information unless stated regarding to the chief complaint.   Review of Systems:  No headache, visual changes, nausea, vomiting, diarrhea, constipation, dizziness, abdominal pain, skin rash, fevers, chills, night sweats, weight loss, swollen lymph nodes, body aches, joint swelling, chest pain, shortness of breath, mood changes. POSITIVE muscle aches  Objective  Blood pressure 100/80, pulse 82, height 5\' 8"  (1.727 m), weight 192 lb (87.1 kg), SpO2 97 %.   General: No apparent distress alert and oriented x3 mood and affect normal, dressed appropriately.  HEENT: Pupils equal, extraocular movements intact  Respiratory: Patient's speak in full sentences and does not appear short of breath  Cardiovascular: No lower extremity edema, non tender, no erythema  Skin: Warm dry intact with no signs of infection or rash on extremities or on axial skeleton.  Abdomen: Soft nontender  Neuro: Cranial nerves II through XII are intact, neurovascularly intact in all extremities with 2+ DTRs and 2+ pulses.  Lymph: No lymphadenopathy of posterior or anterior cervical chain or axillae bilaterally.  Gait normal with good balance and coordination.  MSK:  tender with full range of motion and good stability and symmetric strength and tone of shoulders, elbows, wrist, hip, knee and ankles bilaterally.  Low back pain left side.  Out of proportion compared to the contralateral side.  No masses appreciated.  No enlargement of the spleen noted.  Neurovascularly intact distally.  Negative straight leg test.   Impression and Recommendations:     This case required medical decision making of moderate complexity. The above documentation has been reviewed and is accurate and complete Zachary Pulley, DO        Note: This dictation was prepared with Dragon dictation along with smaller phrase technology. Any transcriptional errors that result from this process are unintentional.

## 2020-01-26 NOTE — Assessment & Plan Note (Signed)
Thoracolumbar pain that seems to be fairly associated on the left side.  Difficulties and comorbidities including history of leukemia status post chemotherapy.  Elevated ferritin and recheck to see if it continues to come down.  We will add CBC secondary to the elevated monocytes previously.  Has been in remission for quite some time but may need further evaluation with by oncologist.  If continuing to have pain I do believe the possibility of MRI of abdominal abdomen pelvis would be helpful for further evaluation.  Patient has had significant work-up but has been independently visualized by me this is included CT scans, previous thoracic MRIs, facet injections that have been fairly unremarkable and has not responded well to a certain treatment.  Patient understands that imaging may not find anything again this could just be musculoskeletal.  We will discuss after laboratory work-up to see if ferritin is trending in the right direction or if any improvement with more conservative therapy.  Total time with patient as well as reviewing imaging in the chart today 40 minutes

## 2020-01-27 ENCOUNTER — Encounter: Payer: Self-pay | Admitting: Physical Therapy

## 2020-01-27 ENCOUNTER — Other Ambulatory Visit: Payer: Self-pay

## 2020-01-27 ENCOUNTER — Ambulatory Visit: Payer: No Typology Code available for payment source | Admitting: Physical Therapy

## 2020-01-27 DIAGNOSIS — M62838 Other muscle spasm: Secondary | ICD-10-CM | POA: Diagnosis not present

## 2020-01-27 DIAGNOSIS — M79652 Pain in left thigh: Secondary | ICD-10-CM | POA: Diagnosis not present

## 2020-01-27 DIAGNOSIS — M542 Cervicalgia: Secondary | ICD-10-CM | POA: Diagnosis not present

## 2020-01-27 NOTE — Therapy (Signed)
[facility] [address] [place], [place], [address] Phone: [phone]   Fax:  [phone]  Physical Therapy Treatment  Patient Details  Name: Zachary Little MRN: [id number] Date of Birth: 1981/08/24 Referring Provider (PT): Charlann Boxer   Encounter Date: 01/27/2020  PT End of Session - 01/27/20 1155    Visit Number  9    Number of Visits  12    Date for PT Re-Evaluation  02/02/20    Authorization Type  [facility]    PT Start Time  1014    PT Stop Time  1101    PT Time Calculation (min)  47 min    Activity Tolerance  Patient tolerated treatment well    Behavior During Therapy  [facility] for tasks assessed/performed       Past Medical History:  Diagnosis Date  . Anxiety   . Blood transfusion without reported diagnosis 2010   During Chemo Treatments  . 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 Thyroid disease     Past Surgical History:  Procedure Laterality Date  . PORT-A-CATH REMOVAL      There were no vitals filed for this visit.  Subjective Assessment - 01/27/20 1044    Subjective  Pt states increased pain in L medial knee and thigh in last few days. R shoulder blade better, but not resolved. Pt had MD appt this week, he states for back/thoracic/lumbar pain, which pt states is chronic in nature.    Currently in Pain?  Yes    Pain Score  1     Pain Location  Shoulder    Pain Orientation  Right    Pain Descriptors / Indicators  Aching    Pain Type  Acute pain    Pain Onset  More than a month ago    Pain Frequency  Intermittent    Aggravating Factors   R cervical rotation    Pain Score  3    Pain Location  Knee    Pain Orientation  Left    Pain Descriptors / Indicators  Aching    Pain Type  Acute pain    Pain Onset  More than a month ago    Pain Frequency  Intermittent    Aggravating Factors   Running on treadmill, increased activity                       [facility] Adult PT Treatment/Exercise -  01/27/20 1010      Exercises   Exercises  Neck      Neck Exercises: Machines for Strengthening   UBE (Upper Arm Bike)  --      Neck Exercises: Theraband   Rows  20 reps;Blue    Shoulder External Rotation  20 reps;Green    Horizontal ABduction  20 reps;Green      Neck Exercises: Standing   Other Standing Exercises  Wall push ups x20;  High plank 30 sec x3;       Neck Exercises: Supine   Other Supine Exercise  Chest press 10lb bil (review for gym)      Neck Exercises: Prone   Other Prone Exercise  Prone I and T on small ball x20 each;       Knee/Hip Exercises: Stretches   Hip Flexor Stretch  2 reps;30 seconds    Hip Flexor Stretch Limitations  kneeling       Knee/Hip Exercises: Aerobic   Tread Mill  3.0 to  5.4 walk/run x 8 min;     Recumbent Bike  L2 x 7 min      Knee/Hip Exercises: Standing   Functional Squat  20 reps    Functional Squat Limitations  15 lb       Manual Therapy   Soft tissue mobilization  DTM to R UT, levator, Rhomboid    Manual Traction  10 sec x10  cervical             PT Education - 01/27/20 1155    Education Details  HEP reviewed    Person(s) Educated  Patient    Methods  Explanation;Demonstration;Verbal cues    Comprehension  Verbalized understanding;Returned demonstration       PT Short Term Goals - 12/22/19 1042      PT SHORT TERM GOAL #1   Title  Pt to be independent with initial HEP     Time  2    Period  Weeks    Status  New    Target Date  01/05/20      PT SHORT TERM GOAL #2   Title  Pt to report decreased pain in neck region to 0-3/10    Time  2    Period  Weeks    Status  New    Target Date  01/05/20        PT Long Term Goals - 12/22/19 1043      PT LONG TERM GOAL #1   Title  Pt to report decreased pain to 0-1/10 in neck and quad    Time  6    Period  Weeks    Status  New    Target Date  02/02/20      PT LONG TERM GOAL #2   Title  Pt to demo improved cervical ROM, to be WNL and pain free, to improve ability  for head turns while driving.     Time  6    Period  Weeks    Status  New    Target Date  02/02/20      PT LONG TERM GOAL #3   Title  Pt to demo ability for walking up to 1 mi, stairs, and squats without pain in L quad, to incease ability for exercise.    Time  6    Period  Weeks    Status  New    Target Date  02/02/20      PT LONG TERM GOAL #4   Title  Pt to be independent with final HEP , for postural strengthening and LE strenthenign.    Time  6    Period  Weeks    Status  New    Target Date  02/02/20            Plan - 01/27/20 1158    Clinical Impression Statement  Pt with good ability for strengthening for LEs, reviewed best exercises for quads and hips for HEP today. No tenderness in quad, but does have pain at medial tibial condyle. Walk/Run mechanics assessed, pt with baseline mild ER/toeing out, mild instability at ankle , but otherwise good mechanics. Discussed lacing shoe differently for more support, and doing more warm up prior to treadmill, to loosen hips/quads, for decreased pain.    Examination-Activity Limitations  Locomotion Level;Sit;Squat;Stairs    Examination-Participation Restrictions  Meal Prep;Cleaning;Community Activity    Stability/Clinical Decision Making  Stable/Uncomplicated    Rehab Potential  Good    PT Frequency  2x / week  PT Duration  6 weeks    PT Treatment/Interventions  ADLs/Self Care Home Management;Cryotherapy;Electrical Stimulation;Ultrasound;Traction;Moist Heat;Iontophoresis 4mg /ml Dexamethasone;Gait training;Stair training;Functional mobility training;Therapeutic activities;Therapeutic exercise;Balance training;Neuromuscular re-education;Manual techniques;Patient/family education;Passive range of motion;Dry needling;Joint Manipulations;Spinal Manipulations;Taping    Consulted and Agree with Plan of Care  Patient       Patient will benefit from skilled therapeutic intervention in order to improve the following deficits and  impairments:  Increased muscle spasms, Decreased activity tolerance, Pain, Impaired flexibility, Decreased strength, Improper body mechanics  Visit Diagnosis: Cervicalgia  Other muscle spasm  Pain in left thigh     Problem List Patient Active Problem List   Diagnosis Date Noted  . Thoracolumbar back pain 01/26/2020  . Chronic pain syndrome 12/29/2019  . Neuropathic pain of thigh, right 12/29/2019  . Lateral femoral cutaneous entrapment syndrome, right 12/29/2019  . 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 Neuropathic pain involving lateral femoral cutaneous nerve, right 12/29/2019  . Nonallopathic lesion of thoracic region 11/16/2019  . Nonallopathic lesion of cervical region 11/16/2019  . Nonallopathic lesion of rib cage 11/16/2019  . Trigger point of right shoulder region 11/16/2019  . Scapular dyskinesis 10/06/2019  . Quadriceps strain, left, initial encounter 10/06/2019  . Depression, major, single episode, complete remission ([place]) 03/09/2018  . Erectile dysfunction 03/09/2018  . Obesity 03/09/2018  . Abnormal gait 01/02/2018  . 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 Osteoarthritis of spine 11/26/2017  . Knee pain 10/31/2017  . Thoracic spondylosis 10/28/2017  . Thoracic radiculopathy 05/23/2017  . Meralgia paresthetica of right side 05/23/2017  . 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 Hypothyroidism (acquired) 05/16/2015  . Vitamin B12 deficiency 10/14/2011    Lyndee Hensen, PT, DPT 12:01 PM  01/27/20    [facility] [place], [place], [address] Phone: [phone]   Fax:  [phone]  Name: Jahaziah Cheeseman MRN: [id number] Date of Birth: 1981-01-26

## 2020-01-28 ENCOUNTER — Other Ambulatory Visit: Payer: Self-pay

## 2020-01-28 DIAGNOSIS — N5082 Scrotal pain: Secondary | ICD-10-CM

## 2020-01-28 NOTE — Progress Notes (Unsigned)
MRI

## 2020-02-01 ENCOUNTER — Encounter: Payer: Self-pay | Admitting: Physical Therapy

## 2020-02-01 ENCOUNTER — Ambulatory Visit: Payer: No Typology Code available for payment source | Admitting: Physical Therapy

## 2020-02-01 ENCOUNTER — Other Ambulatory Visit: Payer: Self-pay

## 2020-02-01 DIAGNOSIS — M79652 Pain in left thigh: Secondary | ICD-10-CM | POA: Diagnosis not present

## 2020-02-01 DIAGNOSIS — M542 Cervicalgia: Secondary | ICD-10-CM | POA: Diagnosis not present

## 2020-02-01 DIAGNOSIS — M62838 Other muscle spasm: Secondary | ICD-10-CM | POA: Diagnosis not present

## 2020-02-01 NOTE — Therapy (Signed)
[facility] [address] [place], [place], [address] Phone: [phone]   Fax:  [phone]  Physical Therapy Treatment/ Re-Cert   Patient Details  Name: Zachary Little MRN: [id number] Date of Birth: [birth year] Referring Provider (PT): Charlann Boxer   Encounter Date: 02/01/2020  PT End of Session - 02/01/20 1109    Visit Number  10    Number of Visits  20    Date for PT Re-Evaluation  02/29/20    Authorization Type  [facility]    PT Start Time  1102    PT Stop Time  1147    PT Time Calculation (min)  45 min    Activity Tolerance  Patient tolerated treatment well    Behavior During Therapy  [facility] for tasks assessed/performed       Past Medical History:  Diagnosis Date  . Anxiety   . Blood transfusion without reported diagnosis 2010   During Chemo Treatments  . 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 Thyroid disease     Past Surgical History:  Procedure Laterality Date  . PORT-A-CATH REMOVAL      There were no vitals filed for this visit.  Subjective Assessment - 02/01/20 1107    Subjective  Pt states pain in Knee and quad better after last session, but flared up yesterday. Has been trying to modify running time. Pt states pain in R shoulder blade"ok", still sore 2/10 with full R rotation.    Currently in Pain?  Yes    Pain Score  2     Pain Location  Shoulder    Pain Orientation  Right    Pain Descriptors / Indicators  Aching    Pain Type  Acute pain    Pain Onset  More than a month ago    Pain Frequency  Intermittent    Pain Score  3    Pain Location  Knee    Pain Orientation  Left    Pain Descriptors / Indicators  Aching    Pain Type  Acute pain    Pain Onset  More than a month ago    Pain Frequency  Intermittent         [facility] PT Assessment - 02/01/20 0001      AROM   Overall AROM Comments  Cervical: WNL, mild soreness at end range of R rotation:  Hips: bil mild limitation for IR, improved  flexion (min limitation ) bilaterally       Strength   Overall Strength Comments  shoulders: 4+/5;  Hips: 4 to 4+/5        Palpation   Palpation comment  Pain at medial L knee, tibial condyle, mild soreness into L quad.   Tightenss/trigger point mild pain at R sup/med scap border                   [facility] Adult PT Treatment/Exercise - 02/01/20 1110      Exercises   Exercises  Neck      Neck Exercises: Theraband   Rows  --    Shoulder External Rotation  --    Horizontal ABduction  --      Neck Exercises: Standing   Other Standing Exercises  --      Neck Exercises: Supine   Other Supine Exercise  --      Neck Exercises: Prone   Other Prone Exercise  --      Knee/Hip Exercises: Stretches  Hip Flexor Stretch  2 reps;30 seconds    Hip Flexor Stretch Limitations  kneeling       Knee/Hip Exercises: Aerobic   Tread Mill  --    Recumbent Bike  L3 x 8 min       Knee/Hip Exercises: Standing   Hip Abduction  20 reps;Both    Abduction Limitations  YTB    Functional Squat  --    Functional Squat Limitations  --      Knee/Hip Exercises: Seated   Long Arc Quad  20 reps    Long Arc Quad Limitations  2.5 lb      Knee/Hip Exercises: Supine   Bridges with Ball Squeeze  20 reps      Knee/Hip Exercises: Sidelying   Hip ABduction  20 reps;Both      Manual Therapy   Manual therapy comments  skilled palpation and monitoring of soft tissue with dry needling.     Soft tissue mobilization  DTM to R UT, levator, Rhomboid    Manual Traction  long leg distraction for hips x2 min bil;        Trigger Point Dry Needling - 02/01/20 0001    Consent Given?  Yes    Education Handout Provided  Previously provided    Muscles Treated Head and Neck  Levator scapulae    Levator Scapulae Response  Twitch response elicited;Palpable increased muscle length   R          PT Education - 02/01/20 1413    Education Details  HEP reviwed, discussed progress and POC    Person(s)  Educated  Patient    Methods  Explanation;Demonstration;Handout;Verbal cues    Comprehension  Verbalized understanding;Returned demonstration;Verbal cues required;Need further instruction       PT Short Term Goals - 02/01/20 1414      PT SHORT TERM GOAL #1   Title  Pt to be independent with initial HEP     Time  2    Period  Weeks    Status  Achieved    Target Date  01/05/20      PT SHORT TERM GOAL #2   Title  Pt to report decreased pain in neck region to 0-3/10    Time  2    Period  Weeks    Status  Achieved    Target Date  01/05/20        PT Long Term Goals - 02/01/20 1414      PT LONG TERM GOAL #1   Title  Pt to report decreased pain to 0-1/10 in neck and quad    Time  6    Period  Weeks    Status  Partially Met      PT LONG TERM GOAL #2   Title  Pt to demo improved cervical ROM, to be WNL and pain free, to improve ability for head turns while driving.     Time  6    Period  Weeks    Status  Partially Met      PT LONG TERM GOAL #3   Title  Pt to demo ability for walking up to 1 mi, stairs, and squats without pain in L quad, to incease ability for exercise.    Time  6    Period  Weeks    Status  Partially Met      PT LONG TERM GOAL #4   Title  Pt to be independent with final HEP , for postural strengthening  and LE strenthenign.    Time  6    Period  Weeks    Status  Partially Met            Plan - 02/01/20 1415    Clinical Impression Statement  Pt has made good progress, with less pain in R shoulder blade region and less pain in thigh. Thigh pain was mostly resolved, but has been bothersome in last week or so. Pain at medial knee and into L quad, no tenderness to palpate quad today. Suggested pt continue to modify running distance, and increase warm up that he is doing. Pt with cervical ROM WNL, but has continued discomfort at R rhomboid and levator region, Dry needling has helped to improve this chronic issue. Pt progressing with ther ex, strengthening,  and HEP. Pt to benefit from continued care for 1x/wk to continue to reduce pain in knee and shoulder blade. Plan to work towards d/c in 2-4 week if pain improves.    Examination-Activity Limitations  Locomotion Level;Sit;Squat;Stairs    Examination-Participation Restrictions  Meal Prep;Cleaning;Community Activity    Stability/Clinical Decision Making  Stable/Uncomplicated    Rehab Potential  Good    PT Frequency  1x / week    PT Duration  4 weeks    PT Treatment/Interventions  ADLs/Self Care Home Management;Cryotherapy;Electrical Stimulation;Ultrasound;Traction;Moist Heat;Iontophoresis 26m/ml Dexamethasone;Gait training;Stair training;Functional mobility training;Therapeutic activities;Therapeutic exercise;Balance training;Neuromuscular re-education;Manual techniques;Patient/family education;Passive range of motion;Dry needling;Joint Manipulations;Spinal Manipulations;Taping    Consulted and Agree with Plan of Care  Patient       Patient will benefit from skilled therapeutic intervention in order to improve the following deficits and impairments:  Increased muscle spasms, Decreased activity tolerance, Pain, Impaired flexibility, Decreased strength, Improper body mechanics  Visit Diagnosis: Cervicalgia  Other muscle spasm  Pain in left thigh     Problem List Patient Active Problem List   Diagnosis Date Noted  . Thoracolumbar back pain 01/26/2020  . Chronic pain syndrome 12/29/2019  . Neuropathic pain of thigh, right 12/29/2019  . Lateral femoral cutaneous entrapment syndrome, right 12/29/2019  . 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 Neuropathic pain involving lateral femoral cutaneous nerve, right 12/29/2019  . Nonallopathic lesion of thoracic region 11/16/2019  . Nonallopathic lesion of cervical region 11/16/2019  . Nonallopathic lesion of rib cage 11/16/2019  . Trigger point of right shoulder region 11/16/2019  . Scapular dyskinesis 10/06/2019  . Quadriceps  strain, left, initial encounter 10/06/2019  . Depression, major, [place] episode, [place] 03/[place]/2018  . Erectile dysfunction 03/09/2018  . Obesity 03/09/2018  . Abnormal gait 01/02/2018  . 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 Osteoarthritis of spine 11/26/2017  . Knee pain 10/31/2017  . Thoracic spondylosis 10/28/2017  . Thoracic radiculopathy 05/23/2017  . Meralgia paresthetica of right side 05/23/2017  . [place] 05/16/2015  . Hypothyroidism (acquired) 05/16/2015  . Vitamin B12 deficiency 10/14/2011    LLyndee Hensen PT, DPT 2:18 PM  02/01/20    [place] [address]Phone: [phone]  Fax:  [phone] Name: BHubbert LandriganMRN: [id number] of Birth: [birth year]

## 2020-02-02 IMAGING — US ULTRASOUND SCROTUM DOPPLER COMPLETE
1 series · 13 of 25 positions shown · non-contrast
Comparison: None.

CLINICAL DATA: Nontender palpable area adjacent to the left
testicle for 7 years

EXAM:
SCROTAL ULTRASOUND
DOPPLER ULTRASOUND OF THE TESTICLES
TECHNIQUE: Complete ultrasound examination of the testicles, epididymis, and
other scrotal structures was performed. Color and spectral Doppler
ultrasound were also utilized to evaluate blood flow to the
testicles.

[Series 1: ultrasound scrotum doppler complete · 0.06mm/px · 13 of 83 slices shown]
[im 1/83]
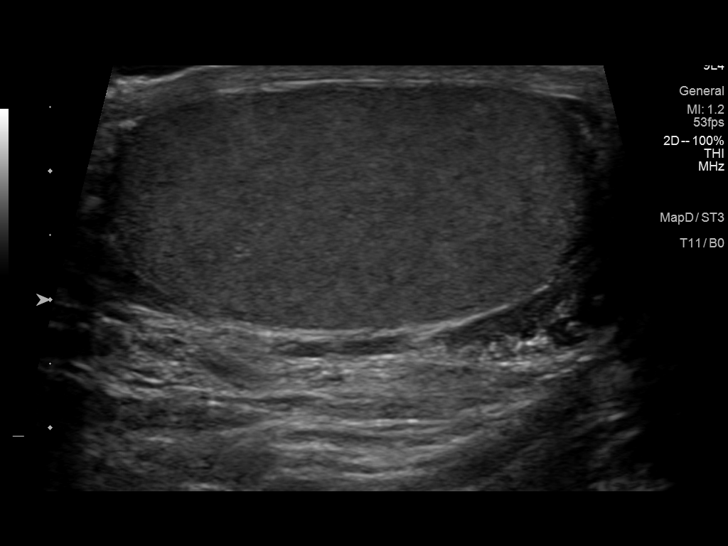
[im 7/83]
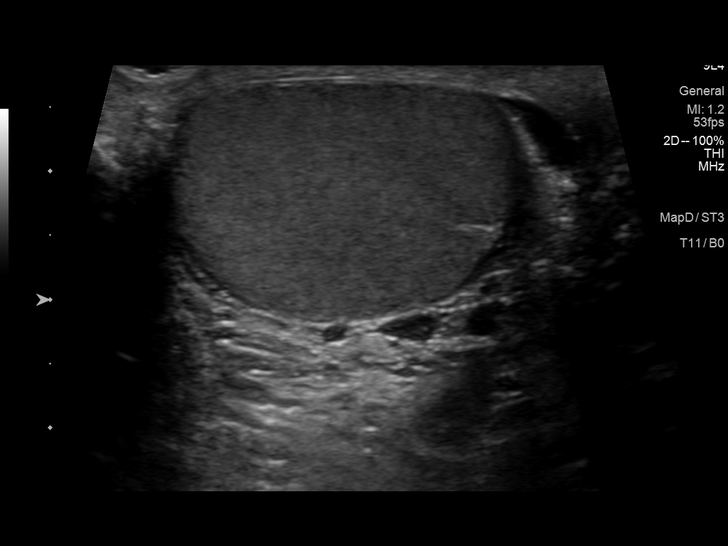
[im 14/83]
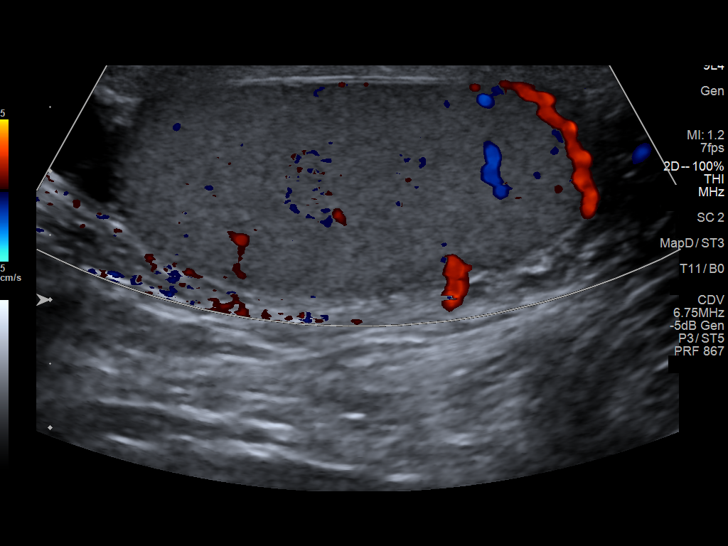
[im 21/83]
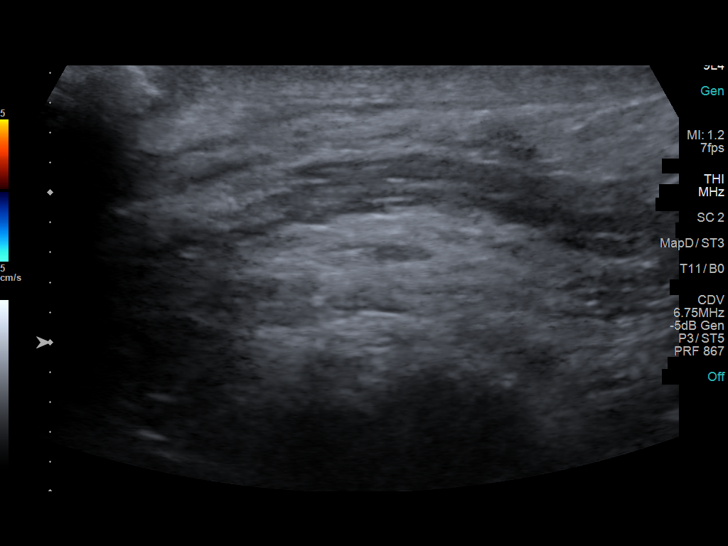
[im 28/83]
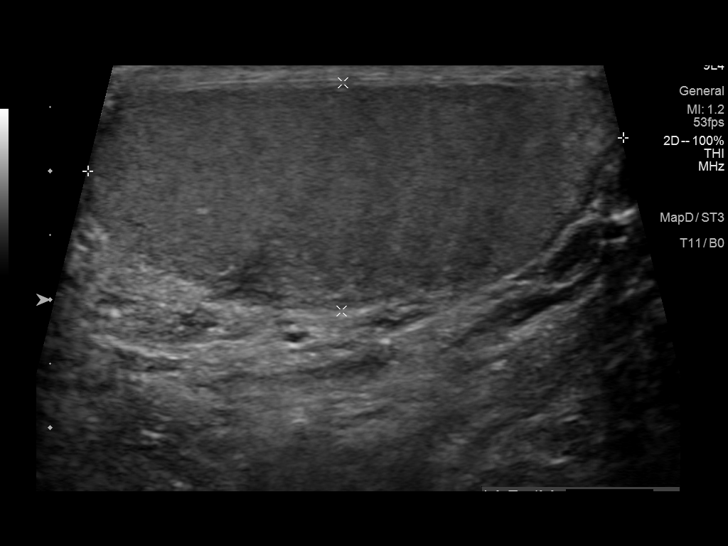
[im 35/83]
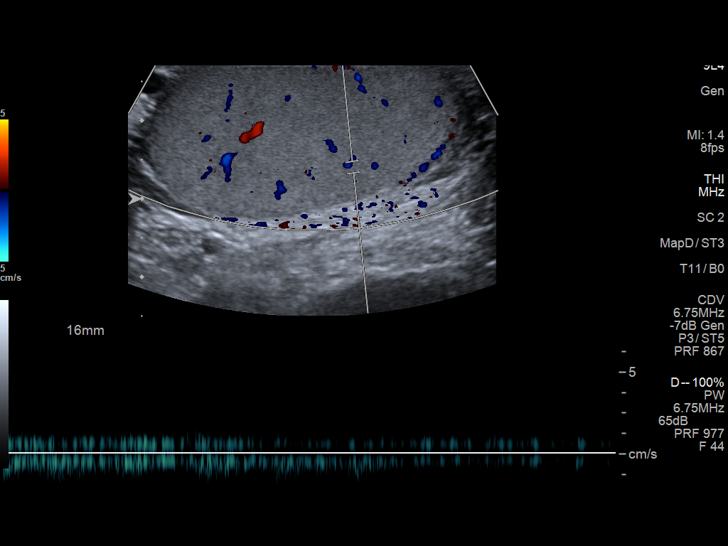
[im 42/83]
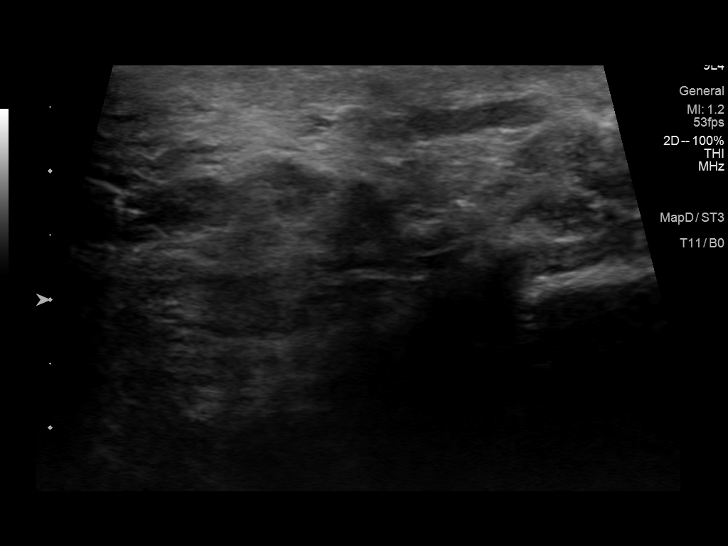
[im 48/83]
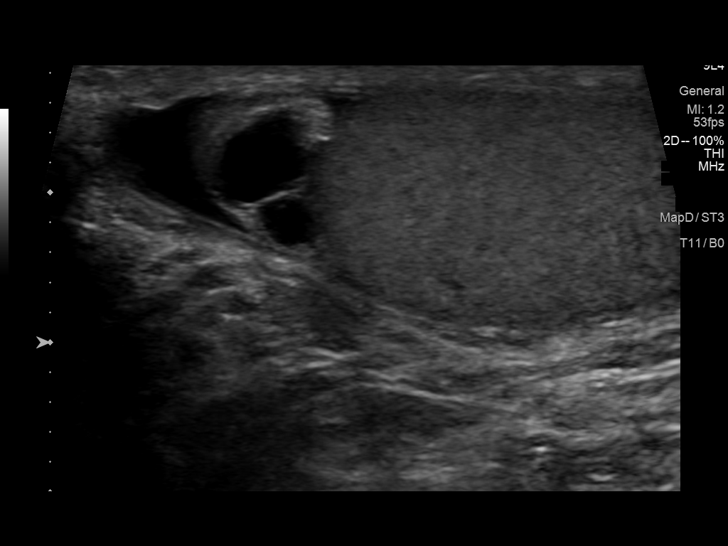
[im 55/83]
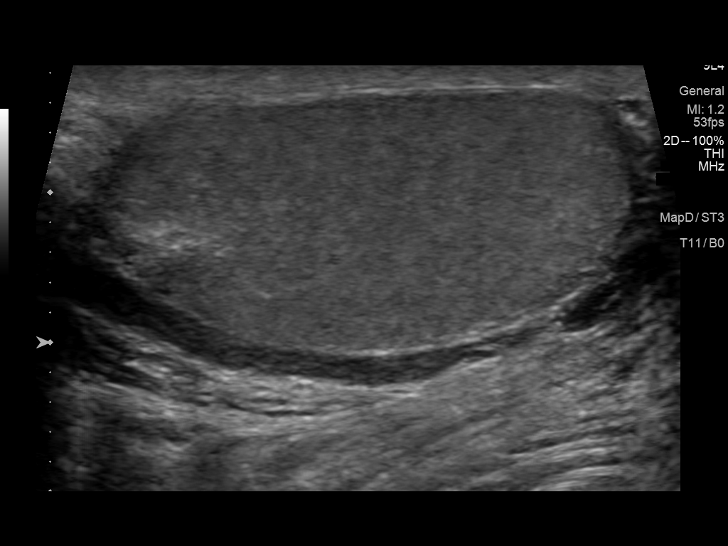
[im 62/83]
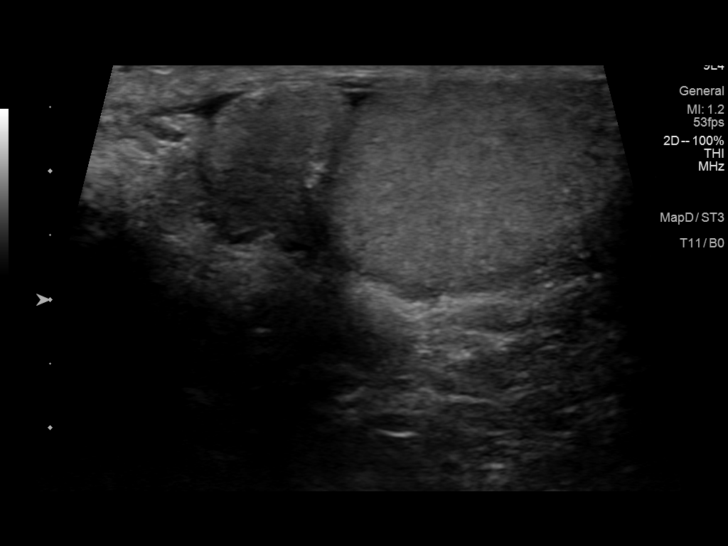
[im 69/83]
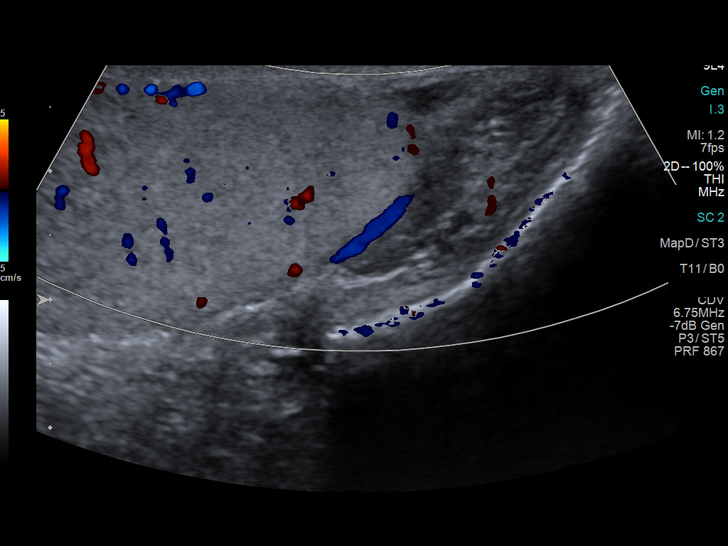
[im 76/83]
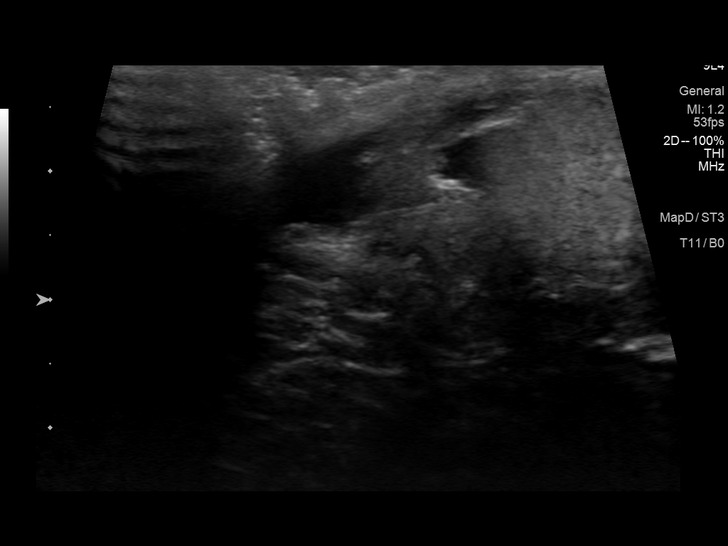
[im 83/83]
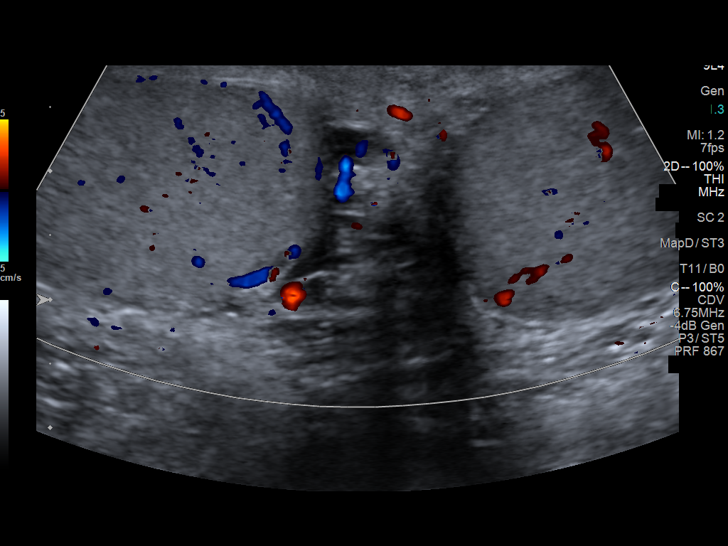

[13 of 25 positions shown; findings below may reference images not displayed]

FINDINGS: Right testicle

Measurements: 3.8 x 1.9 x 2.7 cm. No mass or microlithiasis
visualized.

Left testicle

Measurements: 4.2 x 1.8 x 2.7 cm. No mass or microlithiasis
visualized.

Right epididymis: Multiple anechoic cystic areas are seen within the
right epididymis the largest measuring 7 mm in the head.

Left epididymis: Normal in size and appearance. Adjacent to the left
epididymis in the area palpable abnormality. There is a anechoic
cystic mass measuring 3.8 x 5.6 x 4.7 mm.

Hydrocele:  None visualized.

Varicocele:  None visualized.

Pulsed Doppler interrogation of both testes demonstrates normal low
resistance arterial and venous waveforms bilaterally.
IMPRESSION: 1. Normal-appearing right testicle.
2. Right epididymal cysts.
3. Within the area of palpable abnormality on the left,
subcentimeter cystic lesion which could represent cyst within the
appendix testes versus a partially exophytic epididymal cyst.

## 2020-02-03 ENCOUNTER — Encounter: Payer: Self-pay | Admitting: Physical Therapy

## 2020-02-03 ENCOUNTER — Ambulatory Visit: Payer: No Typology Code available for payment source | Admitting: Physical Therapy

## 2020-02-03 ENCOUNTER — Other Ambulatory Visit: Payer: Self-pay

## 2020-02-03 DIAGNOSIS — M542 Cervicalgia: Secondary | ICD-10-CM | POA: Diagnosis not present

## 2020-02-03 DIAGNOSIS — M79652 Pain in left thigh: Secondary | ICD-10-CM | POA: Diagnosis not present

## 2020-02-03 DIAGNOSIS — M62838 Other muscle spasm: Secondary | ICD-10-CM

## 2020-02-03 NOTE — Therapy (Signed)
[facility] [address] [place], [place], [address] Phone: [phone]   Fax:  [phone]  Physical Therapy Treatment  Patient Details  Name: Zachary Little MRN: [id number] Date of Birth: Oct 30, 1981 Referring Provider (PT): Charlann Boxer   Encounter Date: 02/03/2020  PT End of Session - 02/03/20 1209    Visit Number  11    Number of Visits  20    Date for PT Re-Evaluation  02/29/20    Authorization Type  [facility]    PT Start Time  1102    PT Stop Time  1150    PT Time Calculation (min)  48 min    Activity Tolerance  Patient tolerated treatment well    Behavior During Therapy  [facility] for tasks assessed/performed       Past Medical History:  Diagnosis Date  . Anxiety   . Blood transfusion without reported diagnosis 2010   During Chemo Treatments  . 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 Thyroid disease     Past Surgical History:  Procedure Laterality Date  . PORT-A-CATH REMOVAL      There were no vitals filed for this visit.  Subjective Assessment - 02/03/20 1208    Subjective  Pt states decreasing pain in R scap region. Mild pain in L medial knee, but feels it is improving. Has been doing HEP. Has some concern for worsening thoracic/back pain, seeing MD for this.    Currently in Pain?  Yes    Pain Location  Shoulder    Pain Orientation  Right    Pain Descriptors / Indicators  Aching    Pain Type  Acute pain    Pain Onset  More than a month ago    Pain Frequency  Intermittent    Pain Score  2    Pain Location  Knee    Pain Orientation  Left    Pain Descriptors / Indicators  Aching    Pain Type  Acute pain    Pain Onset  More than a month ago    Pain Frequency  Intermittent                       [facility] Adult PT Treatment/Exercise - 02/03/20 1112      Exercises   Exercises  Neck      Neck Exercises: Theraband   Rows  20 reps;Blue    Horizontal ABduction  20 reps;Green      Neck  Exercises: Standing   Other Standing Exercises  Wall push ups x20;        Neck Exercises: Seated   Other Seated Exercise  Thoracic rotation x5 bil;  Supine thoracic rotation (review for HEP) per pt request for thoracic pain      Neck Exercises: Prone   Other Prone Exercise  PRone T x20;  [place] 2 sec x 10;       Knee/Hip Exercises: Stretches   Hip Flexor Stretch  2 reps;30 seconds    Hip Flexor Stretch Limitations  kneeling     Piriformis Stretch  2 reps;30 seconds    Piriformis Stretch Limitations  seated      Knee/Hip Exercises: Aerobic   Recumbent Bike  L3 x 8 min       Knee/Hip Exercises: Standing   Hip Abduction  20 reps;Both    Abduction Limitations  YTB    SLS with Vectors  SLS with UE flex x10 bil;  Other Standing Knee Exercises  Tandem stance w head turns x10 bi;       Knee/Hip Exercises: Seated   Long Arc Quad  20 reps    Long Arc Quad Limitations  3 lb      Knee/Hip Exercises: Supine   Bridges with [employer]  20 reps      Knee/Hip Exercises: Sidelying   Hip ABduction  --      Manual Therapy   Manual therapy comments  --    Soft tissue mobilization  --    Manual Traction  --               PT Short Term Goals - 02/01/20 1414      PT SHORT TERM GOAL #1   Title  Pt to be independent with initial HEP     Time  2    Period  Weeks    Status  Achieved    Target Date  01/05/20      PT SHORT TERM GOAL #2   Title  Pt to report decreased pain in neck region to 0-3/10    Time  2    Period  Weeks    Status  Achieved    Target Date  01/05/20        PT Long Term Goals - 02/01/20 1414      PT LONG TERM GOAL #1   Title  Pt to report decreased pain to 0-1/10 in neck and quad    Time  6    Period  Weeks    Status  Partially Met      PT LONG TERM GOAL #2   Title  Pt to demo improved cervical ROM, to be WNL and pain free, to improve ability for head turns while driving.     Time  6    Period  Weeks    Status  Partially Met      PT LONG TERM  GOAL #3   Title  Pt to demo ability for walking up to 1 mi, stairs, and squats without pain in L quad, to incease ability for exercise.    Time  6    Period  Weeks    Status  Partially Met      PT LONG TERM GOAL #4   Title  Pt to be independent with final HEP , for postural strengthening and LE strenthenign.    Time  6    Period  Weeks    Status  Partially Met            Plan - 02/03/20 1212    Clinical Impression Statement  Pt with improving pain at R levator region, very minimal with full R rotation with over pressue. Pt with improving strength and ability for ther ex. Does have mild L knee pain with activity today. Discussed best ther ex to continue for HEP .    Examination-Activity Limitations  Locomotion Level;Sit;Squat;Stairs    Examination-Participation Restrictions  Meal Prep;Cleaning;Community Activity    Stability/Clinical Decision Making  Stable/Uncomplicated    Rehab Potential  Good    PT Frequency  1x / week    PT Duration  4 weeks    PT Treatment/Interventions  ADLs/Self Care Home Management;Cryotherapy;Electrical Stimulation;Ultrasound;Traction;Moist Heat;Iontophoresis 65m/ml Dexamethasone;Gait training;Stair training;Functional mobility training;Therapeutic activities;Therapeutic exercise;Balance training;Neuromuscular re-education;Manual techniques;Patient/family education;Passive range of motion;Dry needling;Joint Manipulations;Spinal Manipulations;Taping    Consulted and Agree with Plan of Care  Patient       Patient will benefit from skilled  therapeutic intervention in order to improve the following deficits and impairments:  Increased muscle spasms, Decreased activity tolerance, Pain, Impaired flexibility, Decreased strength, Improper body mechanics  Visit Diagnosis: Cervicalgia  Other muscle spasm  Pain in left thigh     Problem List Patient Active Problem List   Diagnosis Date Noted  . Thoracolumbar back pain 01/26/2020  . Chronic pain syndrome  12/29/2019  . Neuropathic pain of thigh, right 12/29/2019  . Lateral femoral cutaneous entrapment syndrome, right 12/29/2019  . 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 Neuropathic pain involving lateral femoral cutaneous nerve, right 12/29/2019  . Nonallopathic lesion of thoracic region 11/16/2019  . Nonallopathic lesion of cervical region 11/16/2019  . Nonallopathic lesion of rib cage 11/16/2019  . Trigger point of right shoulder region 11/16/2019  . Scapular dyskinesis 10/06/2019  . Quadriceps strain, left, initial encounter 10/06/2019  . Depression, major, single episode, complete remission ([place]) 03/09/2018  . Erectile dysfunction 03/09/2018  . Obesity 03/09/2018  . Abnormal gait 01/02/2018  . 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 Osteoarthritis of spine 11/26/2017  . Knee pain 10/31/2017  . Thoracic spondylosis 10/28/2017  . Thoracic radiculopathy 05/23/2017  . Meralgia paresthetica of right side 05/23/2017  . AML (acute myeloid leukemia) in remission ([place]) 05/16/2015  . Hypothyroidism (acquired) 05/16/2015  . Vitamin B12 deficiency 10/14/2011    Lyndee Hensen, PT, DPT 12:13 PM  02/03/20    [facility] [place], [place], [address] Phone: [phone]   Fax:  [phone]  Name: Zachary Little MRN: [id number] Date of Birth: [birth year]

## 2020-02-10 ENCOUNTER — Encounter: Payer: No Typology Code available for payment source | Admitting: Physical Therapy

## 2020-02-17 ENCOUNTER — Ambulatory Visit: Payer: No Typology Code available for payment source | Admitting: Physical Therapy

## 2020-02-17 ENCOUNTER — Encounter: Payer: Self-pay | Admitting: Physical Therapy

## 2020-02-17 ENCOUNTER — Other Ambulatory Visit: Payer: Self-pay

## 2020-02-17 DIAGNOSIS — M62838 Other muscle spasm: Secondary | ICD-10-CM

## 2020-02-17 DIAGNOSIS — M542 Cervicalgia: Secondary | ICD-10-CM

## 2020-02-17 DIAGNOSIS — M79652 Pain in left thigh: Secondary | ICD-10-CM

## 2020-02-17 NOTE — Therapy (Signed)
[facility] [address] [place], [place], [address] Phone: [phone]   Fax:  [phone]  Physical Therapy Treatment/Discharge    Patient Details  Name: Zachary Little MRN: [id number] Date of Birth: [birth year] Referring Provider (PT): Charlann Boxer   Encounter Date: 02/17/2020  PT End of Session - 02/17/20 1335    Visit Number  12    Number of Visits  20    Date for PT Re-Evaluation  02/29/20    Authorization Type  [facility]    PT Start Time  1103    PT Stop Time  1148    PT Time Calculation (min)  45 min    Activity Tolerance  Patient tolerated treatment well    Behavior During Therapy  [facility] for tasks assessed/performed       Past Medical History:  Diagnosis Date  . Anxiety   . Blood transfusion without reported diagnosis 2010   During Chemo Treatments  . 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 Thyroid disease     Past Surgical History:  Procedure Laterality Date  . PORT-A-CATH REMOVAL      There were no vitals filed for this visit.  Subjective Assessment - 02/17/20 1331    Subjective  Pt last seen 3/11. Pt has been doing well with HEP, and has been able to go to gym for exercise. Feels Knee and thigh pain is better. Did try to squat and states increased knee pain after that. Also states soreness in bil UT/Levator region with walking. States increased walking makes pain in his back and upper scapular region. Feels he is going good with neck and leg pain, Contiues to have concern for back pain that has not not gone away, chronic for couple years. Has MRI for pelvis scheduled in near future, and massage in 2 weeks .    Currently in Pain?  Yes    Pain Score  2     Pain Location  Shoulder    Pain Orientation  Right    Pain Descriptors / Indicators  Aching    Pain Type  Acute pain    Pain Onset  More than a month ago    Pain Frequency  Intermittent    Aggravating Factors   walking for exercise.    Pain  Score  0    Pain Location  Knee                       [facility] Adult PT Treatment/Exercise - 02/17/20 0001      Self-Care   Self-Care  Other Self-Care Comments    Other Self-Care Comments   Discussed use of tennis ball for DTM in QL region, Discussed massage for muscle tension relief in back, Fig 8 strap for walking, Squat mechanics for knee pain       Knee/Hip Exercises: Stretches   Hip Flexor Stretch  2 reps;60 seconds    Hip Flexor Stretch Limitations  thomas test position, with manual psoas release bil;       Knee/Hip Exercises: Aerobic   Recumbent Bike  L3 x 8 min       Knee/Hip Exercises: Standing   Hip Abduction  20 reps;Both    Abduction Limitations  YTB      Knee/Hip Exercises: Supine   Bridges  15 reps    Other Supine Knee/Hip Exercises  Supine march x10: 90/90 heel taps x15; Modified crunch x20; Mini Squats x10;  PT Education - 02/17/20 1334    Education Details  Discussed final HEP, best ther ex to continue for back, neck, and LE pain. Recommended posture, fig 8 strap for walking    Person(s) Educated  Patient    Methods  Explanation;Demonstration;Tactile cues;Verbal cues    Comprehension  Verbalized understanding;Returned demonstration;Verbal cues required       PT Short Term Goals - 02/17/20 1337      PT SHORT TERM GOAL #1   Title  Pt to be independent with initial HEP     Time  2    Period  Weeks    Status  Achieved    Target Date  01/05/20      PT SHORT TERM GOAL #2   Title  Pt to report decreased pain in neck region to 0-3/10    Time  2    Period  Weeks    Status  Achieved    Target Date  01/05/20        PT Long Term Goals - 02/17/20 1337      PT LONG TERM GOAL #1   Title  Pt to report decreased pain to 0-1/10 in neck and quad    Time  6    Period  Weeks    Status  Achieved      PT LONG TERM GOAL #2   Title  Pt to demo improved cervical ROM, to be WNL and pain free, to improve ability for head turns while  driving.     Time  6    Period  Weeks    Status  Achieved      PT LONG TERM GOAL #3   Title  Pt to demo ability for walking up to 1 mi, stairs, and squats without pain in L quad, to incease ability for exercise.    Baseline  Mild pain with deep squat    Time  6    Period  Weeks    Status  Partially Met      PT LONG TERM GOAL #4   Title  Pt to be independent with final HEP , for postural strengthening and LE strenthenign.    Time  6    Period  Weeks    Status  Achieved            Plan - 02/17/20 1339    Clinical Impression Statement  Pt with cervical ROM WNL for all motions. Has much improved pain in levator, rhomboid region overall. States recent pain here with walking for exercise. Recommended he try a fig 8 strap for posture to see if this helps. Pt appears to have good posture, and is consious of posture with mobility. LE pain and knee pain also improved. Pt has been able to exercise regularly without pain. He states pain in knee with deep squats last week. Recommended he squat quite shallow to decrease stress on knees, reviewed correct mechanics for this. He continues to have pain in R hip with hip ER while he is sleeping. We discussed possiblity of impingement, and pt has been working on hip mobility stretching and seated posture for this.Pt does continue to have back pain, we have not worked on this as primary focus, but have discussed body mechanics, posture, reviewed and performed best ther ex, core strenghtening, recommended muscle DTM, to see if pain can be lessened. He is going to f/u with MD on this, and has MRI scheduled next week. Pt agrees with d/c at this time, goals  met for neck and thigh pain. Will continue HEP for this.    Examination-Activity Limitations  Locomotion Level;Sit;Squat;Stairs    Examination-Participation Restrictions  Meal Prep;Cleaning;Community Activity    Stability/Clinical Decision Making  Stable/Uncomplicated    Rehab Potential  Good    PT  Frequency  1x / week    PT Duration  4 weeks    PT Treatment/Interventions  ADLs/Self Care Home Management;Cryotherapy;Electrical Stimulation;Ultrasound;Traction;Moist Heat;Iontophoresis 32m/ml Dexamethasone;Gait training;Stair training;Functional mobility training;Therapeutic activities;Therapeutic exercise;Balance training;Neuromuscular re-education;Manual techniques;Patient/family education;Passive range of motion;Dry needling;Joint Manipulations;Spinal Manipulations;Taping    Consulted and Agree with Plan of Care  Patient       Patient will benefit from skilled therapeutic intervention in order to improve the following deficits and impairments:  Increased muscle spasms, Decreased activity tolerance, Pain, Impaired flexibility, Decreased strength, Improper body mechanics  Visit Diagnosis: Cervicalgia  Other muscle spasm  Pain in left thigh     Problem List Patient Active Problem List   Diagnosis Date Noted  . Thoracolumbar back pain 01/26/2020  . Chronic pain syndrome 12/29/2019  . Neuropathic pain of thigh, right 12/29/2019  . Lateral femoral cutaneous entrapment syndrome, right 12/29/2019  . 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 Neuropathic pain involving lateral femoral cutaneous nerve, right 12/29/2019  . Nonallopathic lesion of thoracic region 11/16/2019  . Nonallopathic lesion of cervical region 11/16/2019  . Nonallopathic lesion of rib cage 11/16/2019  . Trigger point of right shoulder region 11/16/2019  . Scapular dyskinesis 10/06/2019  . Quadriceps strain, left, initial encounter 10/06/2019  . Depression, major, single episode, complete remission ([place] 03/09/2018  . Erectile dysfunction 03/09/2018  . Obesity 03/09/2018  . Abnormal gait 01/02/2018  . 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 Osteoarthritis of spine 11/26/2017  . Knee pain 10/31/2017  . Thoracic spondylosis 10/28/2017  . Thoracic radiculopathy 05/23/2017  . Meralgia  paresthetica of right side 05/23/2017  . [place] 05/16[place]2015  . Hypothyroidism (acquired) 05/16/2015  . Vitamin B12 deficiency 10/14/2011    LLyndee Hensen PT, DPT 9:33 PM  02/17/20     [facility] [place] [address]Phone: [phone]  Fax:  [phone] Name: BBrently VoorhisMRN: [id number] of Birth: 613-Sep-1982  PHYSICAL THERAPY DISCHARGE SUMMARY  Visits from Start of Care: 12  Plan: Patient agrees to discharge.  Patient goals were met. Patient is being discharged due to meeting the stated rehab goals.  ?????      LLyndee Hensen PT, DPT 9:33 PM  02/17/20

## 2020-03-04 ENCOUNTER — Ambulatory Visit
Admission: RE | Admit: 2020-03-04 | Discharge: 2020-03-04 | Disposition: A | Discharge status: Home / Self Care | Payer: No Typology Code available for payment source | Source: Ambulatory Visit | Attending: Family Medicine | Admitting: Family Medicine

## 2020-03-04 DIAGNOSIS — N5082 Scrotal pain: Secondary | ICD-10-CM

## 2020-03-06 ENCOUNTER — Other Ambulatory Visit: Payer: Self-pay | Admitting: Physician Assistant

## 2020-03-06 MED FILL — LEVOTHYROXINE SODIUM 150 MC: 150 | 90 days supply | Qty: 90 | Fill #0

## 2020-03-10 ENCOUNTER — Telehealth: Payer: Self-pay

## 2020-03-10 NOTE — Telephone Encounter (Signed)
Results sent in MyChart message as well.

## 2020-03-10 NOTE — Telephone Encounter (Signed)
Patient called for MRI results from the 10th of April. I did not see a result note to tell him anything. patient would just like a call back to go over those results.

## 2020-04-14 ENCOUNTER — Encounter: Payer: Self-pay | Admitting: Physician Assistant

## 2020-04-14 ENCOUNTER — Other Ambulatory Visit: Payer: Self-pay

## 2020-04-14 ENCOUNTER — Ambulatory Visit (INDEPENDENT_AMBULATORY_CARE_PROVIDER_SITE_OTHER): Payer: No Typology Code available for payment source | Admitting: Physician Assistant

## 2020-04-14 VITALS — BP 118/80 | HR 70 | Temp 98.2°F | Ht 68.0 in | Wt 196.0 lb

## 2020-04-14 DIAGNOSIS — R7989 Other specified abnormal findings of blood chemistry: Secondary | ICD-10-CM | POA: Diagnosis not present

## 2020-04-14 DIAGNOSIS — M25532 Pain in left wrist: Secondary | ICD-10-CM | POA: Diagnosis not present

## 2020-04-14 DIAGNOSIS — M79621 Pain in right upper arm: Secondary | ICD-10-CM

## 2020-04-14 LAB — CBC WITH DIFFERENTIAL/PLATELET
Basophils Absolute: 0 10*3/uL (ref 0.0–0.1)
Basophils Relative: 0.6 % (ref 0.0–3.0)
Eosinophils Absolute: 0.1 10*3/uL (ref 0.0–0.7)
Eosinophils Relative: 1.3 % (ref 0.0–5.0)
HCT: 48 % (ref 39.0–52.0)
Hemoglobin: 16.6 g/dL (ref 13.0–17.0)
Lymphocytes Relative: 32.6 % (ref 12.0–46.0)
Lymphs Abs: 1.8 10*3/uL (ref 0.7–4.0)
MCHC: 34.6 g/dL (ref 30.0–36.0)
MCV: 93.9 fl (ref 78.0–100.0)
Monocytes Absolute: 0.8 10*3/uL (ref 0.1–1.0)
Monocytes Relative: 13.7 % — ABNORMAL HIGH (ref 3.0–12.0)
Neutro Abs: 2.9 10*3/uL (ref 1.4–7.7)
Neutrophils Relative %: 51.8 % (ref 43.0–77.0)
Platelets: 179 10*3/uL (ref 150.0–400.0)
RBC: 5.12 Mil/uL (ref 4.22–5.81)
RDW: 12.6 % (ref 11.5–15.5)
WBC: 5.6 10*3/uL (ref 4.0–10.5)

## 2020-04-14 LAB — B12 AND FOLATE PANEL
Folate: >23.7 ng/mL (ref >5.9)
Vitamin B-12: 284 pg/mL (ref 211–911)

## 2020-04-14 NOTE — Progress Notes (Signed)
Zachary Little is a 39 y.o. male here for a new problem.  I acted as a Education administrator for [employer], PA-C Zachary Pickler, LPN   History of Present Illness:   Chief Complaint  Patient presents with  . Arm Pain    HPI   Arm pain Pt c/o right upper arm pain x 6 months. Has pain when he twists his arm in certain positions and raises him arm. Has tried ice and Ibuprofen no relief. When he takes mobic he has relief of symptoms. Feels like "something is messed up in there".  At its worst, has 10/10 pain because he is physically unable to rotate his arm. Denies numbness or tingling. Denies weakness. He is R-handed.   L wrist pain Has had several month history of L wrist pain. Noticed it over last summer when he was using his computer more. Tried a wrist splint which helped some. Pain is at base of thumb and ulnar side of wrist.    Elevated ferritin This was checked by sports medicine doctor and was found to be elevated, it was rechecked twice and found to be elevated both times.      Past Medical History:  Diagnosis Date  . Anxiety   . Blood transfusion without reported diagnosis 2010   During Chemo Treatments  . Leukemia, acute myeloid, in remission (Zachary Little) 2010  . Thyroid disease      Social History   Tobacco Use  . Smoking status: Never Smoker  . Smokeless tobacco: Never Used  Substance Use Topics  . Alcohol use: Yes    Comment: "maybe one drink every two years"  . Drug use: No    Past Surgical History:  Procedure Laterality Date  . PORT-A-CATH REMOVAL      Family History  Problem Relation Age of Onset  . Healthy Mother   . Healthy Father   . Multiple sclerosis Maternal Grandmother   . Diabetes Maternal Grandfather   . Diabetes Paternal Grandfather   . Prostate cancer Neg Hx   . Colon cancer Neg Hx     No Known Allergies  Current Medications:   Current Outpatient Medications:  .  Cholecalciferol (VITAMIN D-3) 125 MCG (5000 UT) TABS, Take 1 tablet by  mouth daily., Disp: 90 tablet, Rfl: 3 .  levothyroxine (SYNTHROID) 150 MCG tablet, TAKE 1 TABLET BY MOUTH DAILY., Disp: 90 tablet, Rfl: 0   Review of Systems:   ROS  Negative unless otherwise specified per HPI.  Vitals:   Vitals:   04/14/20 1105  BP: 118/80  Pulse: 70  Temp: 98.2 F (36.8 C)  TempSrc: Temporal  SpO2: 97%  Weight: 196 lb (88.9 kg)  Height: 5\' 8"  (1.727 m)     Body mass index is 29.8 kg/m.  Physical Exam:   Physical Exam Vitals and nursing note reviewed.  Constitutional:      Appearance: He is well-developed.  HENT:     Head: Normocephalic.  Eyes:     Conjunctiva/sclera: Conjunctivae normal.     Pupils: Pupils are equal, round, and reactive to light.  Pulmonary:     Effort: Pulmonary effort is normal.  Musculoskeletal:        General: Normal range of motion.     Cervical back: Normal range of motion.     Comments: R upper arm Pain elicited to mid upper arm with internal rotation; no palpable deformity/atrophy  L arm Negative Finkelstein test. No palpable abnormality. FROM.  Skin:    General: Skin is  warm and dry.  Neurological:     Mental Status: He is alert and oriented to person, place, and time.  Psychiatric:        Behavior: Behavior normal.        Thought Content: Thought content normal.        Judgment: Judgment normal.      Assessment and Plan:   Zachary Little was seen today for arm pain.  Diagnoses and all orders for this visit:  Elevated ferritin Will re-check levels before further investigation. May be due to inflammation. Follow-up based upon results and plan discussed with patient. -     Iron, TIBC and Ferritin Panel -     CBC with Differential/Platelet -     B12 and Folate Panel  Pain in right upper arm; Left wrist pain Suspect possible muscle strain? Referral to sports medicine for further evaluation and management. Continue Mobic prn. -     Ambulatory referral to Sports Medicine  . Reviewed expectations re: course of  current medical issues. . Discussed self-management of symptoms. . Outlined signs and symptoms indicating need for more acute intervention. . Patient verbalized understanding and all questions were answered. . See orders for this visit as documented in the electronic medical record. . Patient received an After-Visit Summary.  CMA or LPN served as scribe during this visit. History, Physical, and Plan performed by medical provider. The above documentation has been reviewed and is accurate and complete.   [name], PA-C

## 2020-04-14 NOTE — Patient Instructions (Signed)
It was great to see you!  A referral has been placed for you to see Dr. Lynne Leader with [facility]. Someone from there office will be in touch soon regarding your appointment with him.  I will be in touch with your labs results via mychart.   Take care,  [name] PA-C

## 2020-04-15 LAB — IRON,TIBC AND FERRITIN PANEL
%SAT: 21 % (calc) (ref 20–48)
Ferritin: 467 ng/mL — ABNORMAL HIGH (ref 38–380)
Iron: 70 ug/dL (ref 50–180)
TIBC: 329 mcg/dL (calc) (ref 250–425)

## 2020-04-21 ENCOUNTER — Other Ambulatory Visit: Payer: Self-pay

## 2020-04-21 ENCOUNTER — Ambulatory Visit: Payer: No Typology Code available for payment source | Admitting: Family Medicine

## 2020-04-21 ENCOUNTER — Encounter: Payer: Self-pay | Admitting: Family Medicine

## 2020-04-21 ENCOUNTER — Ambulatory Visit: Payer: Self-pay

## 2020-04-21 VITALS — BP 104/70 | HR 79 | Ht 68.0 in | Wt 197.6 lb

## 2020-04-21 DIAGNOSIS — M25532 Pain in left wrist: Secondary | ICD-10-CM | POA: Diagnosis not present

## 2020-04-21 DIAGNOSIS — M25511 Pain in right shoulder: Secondary | ICD-10-CM | POA: Diagnosis not present

## 2020-04-21 NOTE — Patient Instructions (Signed)
Thank you for coming in today. Try voltaren gel Use the pad on the computer keyboard.  Do the shoulder exercises.  Up to the front Up to the side  Internal rotation External rotation  30 reps 1-2x daily.   Let me know if not improved.   OK to continue weight lifting.

## 2020-04-21 NOTE — Progress Notes (Signed)
Subjective:    I'm seeing this patient as a consultation for:  [name], [place]. Note will be routed back to referring provider/PCP.  CC: R shoulder/arm pain and L wrist pain  I, Molly Weber, LAT, ATC, am serving as scribe for Dr. Lynne Leader.  HPI: Pt is a 39 y/o male presenting w/ c/o R shoulder/upper arm pain and L wrist pain.  R upper arm: Pain x approximately 6 months w/ no known MOI.  He states that he's been doing more working out over the past 2 weeks, particularly w/ push-ups.  He locates his pain to his R lateral deltoid. -Radiating pain: No -Mechanical shoulder symptoms: No -Aggravating factors: R shoulder IR; R arm dangling by side; push-ups -Treatments tried: Mobic; ice  L wrist pain: Pain x approximately one year that he feels is associated w/ increased computer use.  He locates his pain to ulnar-sided wrist pain and also into his L thenar eminence -Mechanical symptoms: No -Swelling: No -Aggravating factors: repetitive activities I.e. computer use;  -Treatments tried: Wrist splint  Past medical history, Surgical history, Family history, Social history, Allergies, and medications have been entered into the medical record, reviewed. 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   Objective:    Vitals:   04/21/20 0943  BP: 104/70  Pulse: 79  SpO2: 97%   General: Well Developed, well nourished, and in no acute distress.  Neuro/Psych: Alert and oriented x3, extra-ocular muscles intact, able to move all 4 extremities, sensation grossly intact. Skin: Warm and dry, no rashes noted.  Respiratory: Not using accessory muscles, speaking in full sentences, trachea midline.   Cardiovascular: Pulses palpable, no extremity edema. Abdomen: Does not appear distended. MSK:  Right shoulder normal-appearing Nontender. Normal motion. Normal strength Mildly positive Hawkins and Neer's test. Negative empty can test. Negative Yergason's and speeds test.  Left hand and wrist pain normal-appearing nontender normal motion.  Lab and Radiology Results  Diagnostic Limited MSK Ultrasound of: Right shoulder Biceps tendon normal-appearing Subscapularis tendon normal-appearing Supraspinatus tendon intact slight hyperechoic change distal tendon insertion. Subacromial bursa normal-appearing Infraspinatus tendon normal-appearing AC joint trace effusion. Impression: Tendinopathy supraspinatus tendon  Diagnostic Limited MSK Ultrasound of: Left wrist Normal-appearing ulnar wrist structures flexor carpi ulnaris and extensor carpi ulnaris tendons Normal dorsal tendon structures. Carpal tunnel increase median nerve diameter Impression: Mild carpal tunnel syndrome changes    Impression and Recommendations:    Assessment and Plan: 39 y.o. male with right shoulder pain rotator cuff tendinopathy.  Plan for home exercise program was taught in clinic today by me.  Also recommend Voltaren gel.  If not better physical therapy would be helpful.  Left wrist pain: Some of it is due to compression from working on a computer.  He also has a little bit of carpal tunnel syndrome and clinically may have some carpal ulnaris tendinitis.  Plan for gel pad for keyboard and Voltaren gel.  Again physical therapy in the future if not better.Marland Kitchen  PDMP not reviewed this encounter. Orders Placed This Encounter  Procedures  . [place] LIMITED JOINT SPACE STRUCTURES UP RIGHT(NO LINKED CHARGES)    Order Specific Question:   Reason for Exam (SYMPTOM  OR DIAGNOSIS REQUIRED)    Answer:   R shoulder pain  Order Specific Question:   Preferred imaging location?    Answer:   [facility]    No orders of the defined types were placed in this encounter.   Discussed warning signs or symptoms. Please see discharge instructions. Patient expresses understanding.   The above documentation has been reviewed and is accurate and complete Lynne Leader, M.D.

## 2020-05-12 ENCOUNTER — Ambulatory Visit: Payer: No Typology Code available for payment source | Admitting: Family Medicine

## 2020-05-12 ENCOUNTER — Other Ambulatory Visit: Payer: Self-pay

## 2020-05-12 VITALS — BP 110/72 | HR 61 | Ht 68.0 in | Wt 197.0 lb

## 2020-05-12 DIAGNOSIS — G8929 Other chronic pain: Secondary | ICD-10-CM | POA: Diagnosis not present

## 2020-05-12 DIAGNOSIS — M545 Low back pain: Secondary | ICD-10-CM | POA: Diagnosis not present

## 2020-05-12 DIAGNOSIS — M25511 Pain in right shoulder: Secondary | ICD-10-CM

## 2020-05-12 NOTE — Patient Instructions (Signed)
Thank you for coming in today. Plan for PT with Lauren at [facility].  Recheck with me in about 6 weeks.  If not sufficiently improved next step for back pain is MRI lumbar spine.   Let me know if you have trouble getting into PT or there is some other problem.

## 2020-05-12 NOTE — Progress Notes (Signed)
I, Wendy Poet, LAT, ATC, am serving as scribe for Dr. Lynne Leader.  Zachary Little is a 39 y.o. male who presents to [place] at [facility] today for f/u of R shoulder/upper arm pain and L wrist pain.  He was last seen by Dr. Georgina Snell on 04/21/20 and noted R lat deltoid-type pain w/ push-ups and w/ R shoulder IR and L wrist pain noted w/ repetitive use activities I.e. using computer keyboard.  He was shown a HEP by Dr.Arnisha Laffoon for his R shoulder and was advised to use Voltaren gel.  He was also advised to purchase a wrist support pad to use w/ his computer keyboard.  Since his last visit, pt reports that he is improving. Still feels pain with chest press and push ups.   Also complaining of left low back pain. Patient has pain when lying on soft surfaces. Has had pain for years.  He notes this pain is chronic and obnoxious and interfering with sleep and his quality of life.  He has had MRI of his cervical spine thoracic spine and pelvis but no lumbar spine advanced imaging.  He had a x-ray of his lumbar spine in March 2021 that did not show any acute significant findings.  He notes that he is not really had physical therapy for this recently and he would be willing to proceed with physical therapy.  Pertinent review of systems: No fevers or chills.  Relevant historical information: History AML   Exam:  BP 110/72   Pulse 61   Ht 5\' 8"  (1.727 m)   Wt 197 lb (89.4 kg)   SpO2 98%   BMI 29.95 kg/m  General: Well Developed, well nourished, and in no acute distress.   MSK: L-spine: Normal-appearing Nontender midline. Nontender paraspinal musculature. Lumbar motion normal rotation and lateral flexion.  Limited extension and flexion. Lower extremity strength reflexes and sensation are intact distally.  Right shoulder normal-appearing normal motion.    Lab and Radiology Results  EXAM: LUMBAR SPINE - COMPLETE 4+ VIEW  COMPARISON:  Plain films lumbar spine  11/10/2017.  FINDINGS: Straightening of lordosis and minimal convex left curvature are unchanged. Vertebral body height and alignment are normal. Intervertebral disc space height is normal. Paraspinous structures demonstrate a large volume of stool in the ascending colon.  IMPRESSION: No acute abnormality or change compared to the prior exam.  Minimal convex left lumbar curvature and straightening of lordosis.  Large stool burden ascending colon.   Electronically Signed   By: Inge Rise M.D. I, Lynne Leader, personally (independently) visualized and performed the interpretation of the images attached in this note.     Assessment and Plan: 39 y.o. male with left low back pain.  Chronic and intermittent mostly at bedtime.  Pain is obnoxious enough that is affecting his quality of life.  After discussion we will proceed with trial of physical therapy.  If not sufficient next step would be MRI lumbar spine.  Right shoulder pain: Slight exacerbation but overall doing a lot better now.  Since he is already going to be seen physical therapy reasonable to have a few episodes of physical therapy for this as well for home exercise teaching.  Recheck in 6 weeks.   PDMP not reviewed this encounter. Orders Placed This Encounter  Procedures  . Ambulatory referral to Physical Therapy    Referral Priority:   Routine    Referral Type:   Physical Medicine    Referral Reason:   Specialty Services  Required    Requested Specialty:   Physical Therapy   No orders of the defined types were placed in this encounter.    Discussed warning signs or symptoms. Please see discharge instructions. Patient expresses understanding.   The above documentation has been reviewed and is accurate and complete Lynne Leader, M.D.

## 2020-06-05 ENCOUNTER — Other Ambulatory Visit: Payer: Self-pay

## 2020-06-05 ENCOUNTER — Ambulatory Visit: Payer: No Typology Code available for payment source | Admitting: Physical Therapy

## 2020-06-05 ENCOUNTER — Encounter: Payer: Self-pay | Admitting: Physical Therapy

## 2020-06-05 DIAGNOSIS — G8929 Other chronic pain: Secondary | ICD-10-CM | POA: Diagnosis not present

## 2020-06-05 DIAGNOSIS — M546 Pain in thoracic spine: Secondary | ICD-10-CM | POA: Diagnosis not present

## 2020-06-05 DIAGNOSIS — M79621 Pain in right upper arm: Secondary | ICD-10-CM | POA: Diagnosis not present

## 2020-06-05 DIAGNOSIS — M545 Low back pain, unspecified: Secondary | ICD-10-CM

## 2020-06-05 NOTE — Patient Instructions (Signed)
Access Code: [email or web address] URL: [email or web address] Date: 06/05/2020 Prepared by: Lyndee Hensen  Exercises Cat Cow - 2 x daily - 2 sets - 10 reps Right Standing Lateral Shift Correction at Wall - Repetitions - 2 x daily - 2 sets - 10 reps Supine Chest Stretch on Foam Roll - 2 x daily - 2 sets - 10 reps Supine Pectoralis Stretch - 2 x daily - 2 sets - 10 reps

## 2020-06-05 NOTE — Therapy (Signed)
[facility] [address] [place], [place], [address] Phone: [phone]   Fax:  [phone]  Physical Therapy Evaluation  Patient Details  Name: Zachary Little MRN: [id number] Date of Birth: [birth year] Referring Provider (PT): Lynne Leader   Encounter Date: 06/05/2020   PT End of Session - 06/05/20 1318    Visit Number 1    Number of Visits 12    Date for PT Re-Evaluation 07/17/20    Authorization Type [facility]    PT Start Time 1022    PT Stop Time 1100    PT Time Calculation (min) 38 min    Activity Tolerance Patient tolerated treatment well    Behavior During Therapy [facility] for tasks assessed/performed           Past Medical History:  Diagnosis Date  . Anxiety   . Blood transfusion without reported diagnosis 2010   During Chemo Treatments  . 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 Thyroid disease     Past Surgical History:  Procedure Laterality Date  . PORT-A-CATH REMOVAL      There were no vitals filed for this visit.    Subjective Assessment - 06/05/20 1023    Subjective Pain at R bicep/tricep, is doing some better now. May have over done it when working out. TRX push ups,  No previous arm/shoulder pain. Also has pain for over 2 years, in  L Thoracic/lumbar region, wraps around to L ribs when driving.  Cant lay on side, proped up on pillow a bit better. Hurts with sleeping and sitting in car only. States no pain with regular activities. Has had PT for a few other body parts in last  year, but has not had specific treatment for back pain.    Pertinent History seen previously for hip pain, leg numb at night, neck pain and knee pain. Previous leukemia 2010    Limitations Sitting    Patient Stated Goals Decreased pain    Currently in Pain? Yes    Pain Score 5     Pain Location Back    Pain Orientation Left    Pain Descriptors / Indicators Aching;Burning    Pain Type Chronic pain    Pain Radiating Towards  L lateral torso, rib    Pain Onset More than a month ago    Pain Frequency Intermittent    Aggravating Factors  Sitting in car/drivers seat, and sleeping   on L or R side    Pain Relieving Factors changing positions    Multiple Pain Sites Yes    Pain Score 3    Pain Location Arm    Pain Orientation Right    Pain Descriptors / Indicators Aching;Tightness    Pain Type Acute pain    Pain Onset More than a month ago    Pain Frequency Intermittent    Aggravating Factors  previous pain with workouts, presses, push ups, bench type activities.    Pain Relieving Factors Rest              [facility] PT Assessment - 06/05/20 0001      Assessment   Medical Diagnosis Back pain, R arm pain    Referring Provider (PT) Lynne Leader    Prior Therapy not for these body parts      Balance Screen   Has the patient fallen in the past 6 months No      Prior Function   Level of Independence Independent  Cognition   Overall Cognitive Status Within Functional Limits for tasks assessed      ROM / Strength   AROM / PROM / Strength AROM;Strength      AROM   Overall AROM Comments R shoulder, elbow WNL    AROM Assessment Site Lumbar    Lumbar Flexion WNL    Lumbar Extension WNL    Lumbar - Right Side Bend WNL    Lumbar - Left Side Bend WNL    Lumbar - Right Rotation WNL    Lumbar - Left Rotation WNL, mild pain      Strength   Overall Strength Comments hips: 4+/5, core: 3/5, Shoulders: 4/5       Palpation   Palpation comment no pain to palpate today,  L lateral torso, pelvic crest and into L hip flexor mildly limited/tight;  minimal pain in R bicep and tricep region, No pain wtih thoracic or lumbar PAs.                       Objective measurements completed on examination: See above findings.       [facility] Adult PT Treatment/Exercise - 06/05/20 0001      Exercises   Exercises Lumbar;Shoulder      Lumbar Exercises: Standing   Other Standing Lumbar Exercises Side glides to R  x 20 at wall :       Lumbar Exercises: Quadruped   Madcat/Old Horse 20 reps    Madcat/Old Horse Limitations x20 also side/side       Shoulder Exercises: Stretch   Other Shoulder Stretches Pec stretch w wrist flex/ext for nerve glide x15 on R;                   PT Education - 06/05/20 1318    Education Details PT POC, Exam findings, HEP    Person(s) Educated Patient    Methods Explanation;Demonstration;Tactile cues;Verbal cues;Handout    Comprehension Verbalized understanding;Returned demonstration;Verbal cues required;Tactile cues required;Need further instruction            PT Short Term Goals - 06/05/20 1326      PT SHORT TERM GOAL #1   Title Pt to be independent with initial HEP     Time 2    Period Weeks    Status New    Target Date 06/19/20      PT SHORT TERM GOAL #2   Title Pt to try positioning modifications for sitting in car and sleeping for attempts for pain relief.    Time 2    Period Weeks    Status New    Target Date 06/19/20             PT Long Term Goals - 06/05/20 1327      PT LONG TERM GOAL #1   Title Pt to report decreased pain in R arm to 0/10 with UE activity and exercise.    Time 6    Period Weeks    Status New    Target Date 07/17/20      PT LONG TERM GOAL #2   Title Pt to reptort decreased pain in T spine and L spine with sleeping and riding in car to 0-2/10    Time 6    Period Weeks    Status New    Target Date 07/17/20      PT LONG TERM GOAL #3   Title Pt to be independent with final HEP for back pain, core  strength, and R arm pain    Time 6    Period Weeks    Status New    Target Date 07/17/20      PT LONG TERM GOAL #4   Title Pt to demo proper mechanics for lift, squat, carry for back pain,   as well as for rows, dumbell presses and push ups for improved safety and pain with exercise.    Time 6    Period Weeks    Status New    Target Date 07/17/20                  Plan - 06/05/20 1330    Clinical  Impression Statement Pt presents with primary complaint of increased pain in R upper arm and thoracic/back pain. Pt with improving pain in arm since resting from exercises, but has mild tenderness in R tricep region. No pain with resisted motions. Pt with poor mechanics for bench press, push up and upper body strengthening exercises and will benefit from education on this. Pt with chronic pain in t/l spine with certain positions that may be compressive to L side, sitting in car and sleeping, iwth pain that wraps around to L ribs. Pt with no pain to palpate today, but pain has been ongoing and bothersome for 2 years, and he has not had treatment thus far for this. Pt educated on positioning to try this week, for attempts for pain relief. Pain mildly reproduced today wiht L lumbar rotation, and with L/R trunk motion in quadruped. Pt with decreased ability for full functional activities, sleeping, driving, and exercise, due to pain, and will benefit from skilled PT to improve.    Personal Factors and Comorbidities Time since onset of injury/illness/exacerbation    Examination-Activity Limitations Bed Mobility;Bend;Sit;Sleep    Examination-Participation Restrictions Driving    Stability/Clinical Decision Making Stable/Uncomplicated    Clinical Decision Making Low    Rehab Potential Good    PT Frequency 2x / week    PT Duration 6 weeks    PT Treatment/Interventions ADLs/Self Care Home Management;Electrical Stimulation;Cryotherapy;Iontophoresis 4mg /ml Dexamethasone;Moist Heat;Traction;Ultrasound;Therapeutic exercise;Therapeutic activities;Functional mobility training;Stair training;Gait training;Balance training;Neuromuscular re-education;Patient/family education;Manual techniques;Vasopneumatic Device;Taping;Dry needling;Passive range of motion;Spinal Manipulations;Joint Manipulations    PT Home Exercise Plan [email or web address]    Consulted and Agree with Plan of Care Patient           Patient will benefit from  skilled therapeutic intervention in order to improve the following deficits and impairments:  Improper body mechanics, Pain, Increased muscle spasms, Decreased activity tolerance, Decreased strength  Visit Diagnosis: Pain in right upper arm  Pain in thoracic spine  Chronic left-sided low back pain without sciatica     Problem List Patient Active Problem List   Diagnosis Date Noted  . Thoracolumbar back pain 01/26/2020  . Chronic pain syndrome 12/29/2019  . Neuropathic pain of thigh, right 12/29/2019  . Lateral femoral cutaneous entrapment syndrome, right 12/29/2019  . 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 Neuropathic pain involving lateral femoral cutaneous nerve, right 12/29/2019  . Nonallopathic lesion of thoracic region 11/16/2019  . Nonallopathic lesion of cervical region 11/16/2019  . Nonallopathic lesion of rib cage 11/16/2019  . Trigger point of right shoulder region 11/16/2019  . Scapular dyskinesis 10/06/2019  . Quadriceps strain, left, initial encounter 10/06/2019  . 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 Erectile dysfunction 03/09/2018  . Obesity 03/09/2018  . Abnormal gait 01/02/2018  . 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 Osteoarthritis  of spine 11/26/2017  . Knee pain 10/31/2017  . Thoracic spondylosis 10/28/2017  . Thoracic radiculopathy 05/23/2017  . Meralgia paresthetica of right side 05/23/2017  . AML (acute myeloid leukemia) in remission ([place]) 05/16/2015  . Hypothyroidism (acquired) 05/16/2015  . Vitamin B12 deficiency 10/14/2011    Lyndee Hensen, PT, DPT 1:44 PM  06/05/20    [facility], [place], [address] Phone: [phone]   Fax:  [phone]  Name: Zachary Little MRN: [id number] Date of Birth: [birth year]

## 2020-06-12 ENCOUNTER — Other Ambulatory Visit: Payer: Self-pay | Admitting: Physician Assistant

## 2020-06-12 MED FILL — LEVOTHYROXINE SODIUM 150 MC: 150 | 90 days supply | Qty: 90 | Fill #0

## 2020-06-14 ENCOUNTER — Encounter: Payer: No Typology Code available for payment source | Admitting: Physical Therapy

## 2020-06-19 ENCOUNTER — Ambulatory Visit: Payer: No Typology Code available for payment source | Admitting: Physical Therapy

## 2020-06-19 ENCOUNTER — Other Ambulatory Visit: Payer: Self-pay

## 2020-06-19 ENCOUNTER — Encounter: Payer: Self-pay | Admitting: Physical Therapy

## 2020-06-19 DIAGNOSIS — M79621 Pain in right upper arm: Secondary | ICD-10-CM

## 2020-06-19 DIAGNOSIS — M546 Pain in thoracic spine: Secondary | ICD-10-CM

## 2020-06-19 DIAGNOSIS — M545 Low back pain: Secondary | ICD-10-CM

## 2020-06-19 DIAGNOSIS — G8929 Other chronic pain: Secondary | ICD-10-CM

## 2020-06-19 NOTE — Therapy (Signed)
[facility] [address] [place], [place], [address] Phone: [phone]   Fax:  [phone]  Physical Therapy Treatment  Patient Details  Name: Zachary Little MRN: [id number] Date of Birth: 06/15/1981 Referring Provider (PT): Lynne Leader   Encounter Date: 06/19/2020   PT End of Session - 06/19/20 1416    Visit Number 2    Number of Visits 12    Date for PT Re-Evaluation 07/17/20    Authorization Type [facility]    PT Start Time 1017    PT Stop Time 1100    PT Time Calculation (min) 43 min    Activity Tolerance Patient tolerated treatment well    Behavior During Therapy [facility] for tasks assessed/performed           Past Medical History:  Diagnosis Date  . Anxiety   . Blood transfusion without reported diagnosis 2010   During Chemo Treatments  . 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 Thyroid disease     Past Surgical History:  Procedure Laterality Date  . PORT-A-CATH REMOVAL      There were no vitals filed for this visit.   Subjective Assessment - 06/19/20 1022    Subjective Pt states same pain as last visit. Has been doing HEP, minimal changes in pain, hurts at night when laying down and when driving.    Patient Stated Goals Decreased pain    Currently in Pain? Yes    Pain Score 5     Pain Location Back    Pain Orientation Left    Pain Descriptors / Indicators Aching    Pain Type Chronic pain    Pain Onset More than a month ago    Pain Frequency Intermittent                             [facility] Adult PT Treatment/Exercise - 06/19/20 0001      Lumbar Exercises: Stretches   Other Lumbar Stretch Exercise Standing QL stretch BIl, at wall 30 sec x 3 ea      Lumbar Exercises: Standing   Row 20 reps    Theraband Level (Row) Level 4 (Blue)    Other Standing Lumbar Exercises Side glides to R x 20 at wall : SB to R and L x 5 each with OP     Other Standing Lumbar Exercises Lumbar rotation  RTB x 20 bil;       Lumbar Exercises: Seated   Other Seated Lumbar Exercises Fwd hip hinge x 10       Lumbar Exercises: Quadruped   Madcat/Old Horse 20 reps    Opposite Arm/Leg Raise 20 reps    Opposite Arm/Leg Raise Limitations x10 LEs only, x 15 UE/LE       Shoulder Exercises: Standing   Other Standing Exercises Push ups modified at counter x 10;       Shoulder Exercises: Stretch   Corner Stretch 3 reps;30 seconds    Corner Stretch Limitations Doorway      Manual Therapy   Manual Therapy Joint mobilization;Soft tissue mobilization    Joint Mobilization thoracic and lumbar PA mobs    Soft tissue mobilization DTM to L QL, lumbar parspinals, into L pelvic crest                     PT Short Term Goals - 06/05/20 1326      PT SHORT TERM  GOAL #1   Title Pt to be independent with initial HEP     Time 2    Period Weeks    Status New    Target Date 06/19/20      PT SHORT TERM GOAL #2   Title Pt to try positioning modifications for sitting in car and sleeping for attempts for pain relief.    Time 2    Period Weeks    Status New    Target Date 06/19/20             PT Long Term Goals - 06/05/20 1327      PT LONG TERM GOAL #1   Title Pt to report decreased pain in R arm to 0/10 with UE activity and exercise.    Time 6    Period Weeks    Status New    Target Date 07/17/20      PT LONG TERM GOAL #2   Title Pt to reptort decreased pain in T spine and L spine with sleeping and riding in car to 0-2/10    Time 6    Period Weeks    Status New    Target Date 07/17/20      PT LONG TERM GOAL #3   Title Pt to be independent with final HEP for back pain, core strength, and R arm pain    Time 6    Period Weeks    Status New    Target Date 07/17/20      PT LONG TERM GOAL #4   Title Pt to demo proper mechanics for lift, squat, carry for back pain,   as well as for rows, dumbell presses and push ups for improved safety and pain with exercise.    Time 6    Period  Weeks    Status New    Target Date 07/17/20                 Plan - 06/19/20 1417    Clinical Impression Statement Pt with minimal pain to palpate today in L torso. Ther ex done for core and rotational strengthening. Pt also continues to have stiffness in R hip(for flexion) noted today, and noted at previous PT courses in past.    Personal Factors and Comorbidities Time since onset of injury/illness/exacerbation    Examination-Activity Limitations Bed Mobility;Bend;Sit;Sleep    Examination-Participation Restrictions Driving    Stability/Clinical Decision Making Stable/Uncomplicated    Rehab Potential Good    PT Frequency 2x / week    PT Duration 6 weeks    PT Treatment/Interventions ADLs/Self Care Home Management;Electrical Stimulation;Cryotherapy;Iontophoresis 4mg /ml Dexamethasone;Moist Heat;Traction;Ultrasound;Therapeutic exercise;Therapeutic activities;Functional mobility training;Stair training;Gait training;Balance training;Neuromuscular re-education;Patient/family education;Manual techniques;Vasopneumatic Device;Taping;Dry needling;Passive range of motion;Spinal Manipulations;Joint Manipulations    PT Home Exercise Plan [id number]    Consulted and Agree with Plan of Care Patient           Patient will benefit from skilled therapeutic intervention in order to improve the following deficits and impairments:  Improper body mechanics, Pain, Increased muscle spasms, Decreased activity tolerance, Decreased strength  Visit Diagnosis: Pain in thoracic spine  Chronic left-sided low back pain without sciatica  Pain in right upper arm     Problem List Patient Active Problem List   Diagnosis Date Noted  . Thoracolumbar back pain 01/26/2020  . Chronic pain syndrome 12/29/2019  . Neuropathic pain of thigh, right 12/29/2019  . Lateral femoral cutaneous entrapment syndrome, right 12/29/2019  . 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  Neuropathic pain involving  lateral femoral cutaneous nerve, right 12/29/2019  . Nonallopathic lesion of thoracic region 11/16/2019  . Nonallopathic lesion of cervical region 11/16/2019  . Nonallopathic lesion of rib cage 11/16/2019  . Trigger point of right shoulder region 11/16/2019  . Scapular dyskinesis 10/06/2019  . Quadriceps strain, left, initial encounter 10/06/2019  . Depression, major, single episode, complete remission ([place]) 03/09/2018  . Erectile dysfunction 03/09/2018  . Obesity 03/09/2018  . Abnormal gait 01/02/2018  . 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 Osteoarthritis of spine 11/26/2017  . Knee pain 10/31/2017  . Thoracic spondylosis 10/28/2017  . Thoracic radiculopathy 05/23/2017  . Meralgia paresthetica of right side 05/23/2017  . 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 Hypothyroidism (acquired) 05/16/2015  . Vitamin B12 deficiency 10/14/2011    Lyndee Hensen, PT, DPT 2:20 PM  06/19/20    [facility] [place], [place], [address] Phone: [phone]   Fax:  [phone]  Name: Ellias Mcelreath MRN: [id number] Date of Birth: Jan 12, 1981

## 2020-06-26 ENCOUNTER — Encounter: Payer: Self-pay | Admitting: Physical Therapy

## 2020-06-26 ENCOUNTER — Other Ambulatory Visit: Payer: Self-pay

## 2020-06-26 ENCOUNTER — Ambulatory Visit: Payer: No Typology Code available for payment source | Admitting: Physical Therapy

## 2020-06-26 DIAGNOSIS — M545 Low back pain, unspecified: Secondary | ICD-10-CM

## 2020-06-26 DIAGNOSIS — M79621 Pain in right upper arm: Secondary | ICD-10-CM | POA: Diagnosis not present

## 2020-06-26 DIAGNOSIS — M546 Pain in thoracic spine: Secondary | ICD-10-CM

## 2020-06-26 DIAGNOSIS — G8929 Other chronic pain: Secondary | ICD-10-CM

## 2020-06-26 NOTE — Therapy (Addendum)
[facility] [address] [place], [place], [address] Phone: [phone]   Fax:  [phone]  Physical Therapy Treatment  Patient Details  Name: Zachary Little MRN: [id number] Date of Birth: Jul 01, 1981 Referring Provider (PT): Lynne Leader   Encounter Date: 06/26/2020   PT End of Session - 06/26/20 1019    Visit Number 3    Number of Visits 12    Date for PT Re-Evaluation 07/17/20    Authorization Type [facility]    PT Start Time 1013    PT Stop Time 1059    PT Time Calculation (min) 46 min    Activity Tolerance Patient tolerated treatment well    Behavior During Therapy [facility] for tasks assessed/performed           Past Medical History:  Diagnosis Date  . Anxiety   . Blood transfusion without reported diagnosis 2010   During Chemo Treatments  . 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 Thyroid disease     Past Surgical History:  Procedure Laterality Date  . PORT-A-CATH REMOVAL      There were no vitals filed for this visit.   Subjective Assessment - 06/26/20 1019    Subjective Pt states no changes in back pain. Has been able to exercise, still having difficulty sleeping and with driving. Reports that he has tried chiropractic in the past, last year, went 5-6 times, reports no improvments.    Currently in Pain? Yes                             [facility] Adult PT Treatment/Exercise - 06/26/20 1023      Lumbar Exercises: Stretches   Other Lumbar Stretch Exercise Standing QL stretch BIl, at wall 30 sec x 3 ea      Lumbar Exercises: Standing   Row 20 reps    Theraband Level (Row) Level 4 (Blue)    Shoulder Extension 20 reps    Theraband Level (Shoulder Extension) Level 4 (Blue)    Other Standing Lumbar Exercises Lumbar rotation GTB x 20 bil;       Lumbar Exercises: Seated   Other Seated Lumbar Exercises Fwd hip hinge x 10     Other Seated Lumbar Exercises Lat pull bl TB x 20      Lumbar  Exercises: Prone   Other Prone Lumbar Exercises Prone T, I, Y. over PBall x 15 each;       Lumbar Exercises: Quadruped   Opposite Arm/Leg Raise Limitations x20 UE/LE  5 sec holds       Shoulder Exercises: Stretch   Corner Stretch 3 reps;30 seconds    Corner Stretch Limitations Doorway      Manual Therapy   Manual Therapy Joint mobilization;Soft tissue mobilization    Joint Mobilization thoracic and lumbar PA mobs, L and R post hip mobs ,  R hip inf distraction                     PT Short Term Goals - 06/05/20 1326      PT SHORT TERM GOAL #1   Title Pt to be independent with initial HEP     Time 2    Period Weeks    Status New    Target Date 06/19/20      PT SHORT TERM GOAL #2   Title Pt to try positioning modifications for sitting in car and sleeping for  attempts for pain relief.    Time 2    Period Weeks    Status New    Target Date 06/19/20             PT Long Term Goals - 06/05/20 1327      PT LONG TERM GOAL #1   Title Pt to report decreased pain in R arm to 0/10 with UE activity and exercise.    Time 6    Period Weeks    Status New    Target Date 07/17/20      PT LONG TERM GOAL #2   Title Pt to reptort decreased pain in T spine and L spine with sleeping and riding in car to 0-2/10    Time 6    Period Weeks    Status New    Target Date 07/17/20      PT LONG TERM GOAL #3   Title Pt to be independent with final HEP for back pain, core strength, and R arm pain    Time 6    Period Weeks    Status New    Target Date 07/17/20      PT LONG TERM GOAL #4   Title Pt to demo proper mechanics for lift, squat, carry for back pain,   as well as for rows, dumbell presses and push ups for improved safety and pain with exercise.    Time 6    Period Weeks    Status New    Target Date 07/17/20                 Plan - 06/26/20 1305    Clinical Impression Statement Ther ex progressed today for scapular and lat strengthening, as well as core  strength. Pt challenged with exercises, but only with slight discomfort in back/torso. HEP updated to include. Continued hip mobs for R hip flexion, pt with likely impingment with flexion, and has ROM limitations for fleixon. Plan to progress as tolerated.    Personal Factors and Comorbidities Time since onset of injury/illness/exacerbation    Examination-Activity Limitations Bed Mobility;Bend;Sit;Sleep    Examination-Participation Restrictions Driving    Stability/Clinical Decision Making Stable/Uncomplicated    Rehab Potential Good    PT Frequency 2x / week    PT Duration 6 weeks    PT Treatment/Interventions ADLs/Self Care Home Management;Electrical Stimulation;Cryotherapy;Iontophoresis 42m/ml Dexamethasone;Moist Heat;Traction;Ultrasound;Therapeutic exercise;Therapeutic activities;Functional mobility training;Stair training;Gait training;Balance training;Neuromuscular re-education;Patient/family education;Manual techniques;Vasopneumatic Device;Taping;Dry needling;Passive range of motion;Spinal Manipulations;Joint Manipulations    PT Home Exercise Plan [id number]   Consulted and Agree with Plan of Care Patient           Patient will benefit from skilled therapeutic intervention in order to improve the following deficits and impairments:  Improper body mechanics, Pain, Increased muscle spasms, Decreased activity tolerance, Decreased strength  Visit Diagnosis: Pain in thoracic spine  Chronic left-sided low back pain without sciatica  Pain in right upper arm     Problem List Patient Active Problem List   Diagnosis Date Noted  . Thoracolumbar back pain 01/26/2020  . Chronic pain syndrome 12/29/2019  . Neuropathic pain of thigh, right 12/29/2019  . Lateral femoral cutaneous entrapment syndrome, right 12/29/2019  . 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 Neuropathic pain involving lateral femoral cutaneous nerve, right 12/29/2019  . Nonallopathic lesion of thoracic  region 11/16/2019  . Nonallopathic lesion of cervical region 11/16/2019  . Nonallopathic lesion of rib cage 11/16/2019  . Trigger point of right shoulder region 11/16/2019  .  Scapular dyskinesis 10/06/2019  . Quadriceps strain, left, initial encounter 10/06/2019  . Depression, major, single episode, complete remission ([place]) 03/09/2018  . Erectile dysfunction 03/09/2018  . Obesity 03/09/2018  . Abnormal gait 01/02/2018  . 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 Osteoarthritis of spine 11/26/2017  . Knee pain 10/31/2017  . Thoracic spondylosis 10/28/2017  . Thoracic radiculopathy 05/23/2017  . Meralgia paresthetica of right side 05/23/2017  . AML (acute myeloid leukemia) in remission ([place]) 05/16/2015  . Hypothyroidism (acquired) 05/16/2015  . Vitamin B12 deficiency 10/14/2011    Lyndee Hensen, PT, DPT 1:09 PM  06/26/20    [facility] [place], [place], [address] Phone: [phone]   Fax:  [phone]  Name: Zachary Little MRN: [id number] Date of Birth: July 09, 1981     PHYSICAL THERAPY DISCHARGE SUMMARY  Visits from Start of Care: 3  Plan: Patient agrees to discharge.  Patient goals were partially met. Patient is being discharged due to not returning since the last visit.  ?????   Pt did not return after last visit    Lyndee Hensen, PT, DPT 10:56 PM  09/05/20

## 2020-06-27 ENCOUNTER — Ambulatory Visit: Payer: Self-pay

## 2020-06-27 ENCOUNTER — Encounter: Payer: Self-pay | Admitting: Family Medicine

## 2020-06-27 ENCOUNTER — Ambulatory Visit: Payer: No Typology Code available for payment source | Admitting: Family Medicine

## 2020-06-27 VITALS — BP 110/82 | HR 73 | Ht 68.0 in | Wt 199.0 lb

## 2020-06-27 DIAGNOSIS — G8929 Other chronic pain: Secondary | ICD-10-CM

## 2020-06-27 DIAGNOSIS — M545 Low back pain: Secondary | ICD-10-CM | POA: Diagnosis not present

## 2020-06-27 DIAGNOSIS — M5416 Radiculopathy, lumbar region: Secondary | ICD-10-CM | POA: Diagnosis not present

## 2020-06-27 DIAGNOSIS — M25562 Pain in left knee: Secondary | ICD-10-CM | POA: Diagnosis not present

## 2020-06-27 NOTE — Patient Instructions (Signed)
Thank you for coming in today.  Plan for lumbar MRI.  Recheck after MRI.  Add voltaren gel to the inside of the knee 2-4x daily.  Consider body helix thigh sleeve and full knee sleeve.

## 2020-06-27 NOTE — Progress Notes (Signed)
Rito Ehrlich, am serving as a Education administrator for Dr. Lynne Leader.  Zachary Little is a 39 y.o. male who presents to [facility] today for thigh and knee pain.  He was last seen by Dr. Georgina Snell on 05/12/20 for f/u of R shoulder, L wrist and low back pain.  He was referred to PT for his back pain and has completed 3 visits.  Since his last visit, he reports Been jogging more recently and L medial knee has a sharp pain and the top of L thigh has an aching feeling that is worse when leg is extended.  He was seen previously by Dr. Paulla Fore in 2018-19 for his L knee that he initially injured in Nov 2018 while playing a game on his Playstation.  Pertinent review of systems: No fevers or chills  Relevant historical information: Hypothyroidism, history of cancer   Exam:  BP 110/82 (BP Location: Left Arm, Patient Position: Sitting, Cuff Size: Normal)   Pulse 73   Ht 5\' 8"  (1.727 m)   Wt 199 lb (90.3 kg)   SpO2 98%   BMI 30.26 kg/m  General: Well Developed, well nourished, and in no acute distress.   MSK: Left knee normal-appearing Normal motion. Normal strength. Stable ligamentous exam. Negative McMurray's testing. Mildly tender palpation medial joint line  L-spine normal-appearing Normal motion. Mildly positive left-sided slump test. Normal reflexes and strength.  Left hip normal-appearing normal motion normal strength    Lab and Radiology Results  Diagnostic Limited MSK Ultrasound of: Left knee Normal quad tendon and patellar tendon.  No significant joint effusion. Medial joint line normal-appearing medial meniscus. Tiny bone spur medial proximal tibia somewhat impinging upon MCL with dynamic testing. Impression: Possible bony impingement medial joint line at MCL.  EXAM: LUMBAR SPINE - COMPLETE 4+ VIEW  COMPARISON:  Plain films lumbar spine 11/10/2017.  FINDINGS: Straightening of lordosis and minimal convex left curvature are unchanged.  Vertebral body height and alignment are normal. Intervertebral disc space height is normal. Paraspinous structures demonstrate a large volume of stool in the ascending colon.  IMPRESSION: No acute abnormality or change compared to the prior exam.  Minimal convex left lumbar curvature and straightening of lordosis.  Large stool burden ascending colon.   Electronically Signed   By: Inge Rise M.D.   On: 01/26/2020 16:15  EXAM: MRI OF THE LEFT KNEE WITHOUT CONTRAST  TECHNIQUE: Multiplanar, multisequence MR imaging of the knee was performed. No intravenous contrast was administered.  COMPARISON:  Left knee x-rays dated October 01, 2017.  FINDINGS: MENISCI  Medial meniscus:  Intact.  Lateral meniscus:  Intact.  LIGAMENTS  Cruciates:  Intact ACL and PCL.  Collaterals: Medial collateral ligament is intact. Lateral collateral ligament complex is intact.  CARTILAGE  Patellofemoral: Focal near full-thickness fissure over the trochlear groove.  Medial:  No chondral defect.  Lateral:  No chondral defect.  Joint:  No joint effusion. Normal Hoffa's fat. No plical thickening.  Popliteal Fossa:  No Baker cyst. Intact popliteus tendon.  Extensor Mechanism: Intact quadriceps tendon and patellar tendon. Intact medial and lateral patellar retinaculum. Intact MPFL.  Bones: No focal marrow signal abnormality. No fracture or dislocation.  Other: None.  IMPRESSION: 1. No acute abnormality.  No evidence of internal derangement.   Electronically Signed   By: Titus Dubin M.D.   On: 03/08/2018 16:27  I, Lynne Leader, personally (independently) visualized and performed the interpretation of the images attached in this note.   Assessment  and Plan: 39 y.o. male with left medial knee pain and thigh pain and low back pain.   Left Medial Knee pain: Exacerbation of chronic issue ongoing for years worse recently with jogging.  Certainly this  could be knee issue.  The main issue that I see on ultrasound today is a tiny bone spur at the proximal medial tibia that could be impinging on the MCL however this is pretty small and probably not causing his pain.  Could also be L3 radiculopathy which is more likely.  Plan for Voltaren gel and also recommend trial of iontophoresis on this area with physical therapy.  Thigh pain: More likely to be L3 radiculopathy.  Plan for MRI as patient is already had a fair amount of physical therapy for this and other related issues.  Recheck after MRI.  Chronic back pain: Not much better with physical therapy.  Again MRI should be helpful at this point.   PDMP not reviewed this encounter. Orders Placed This Encounter  Procedures  . [place] LIMITED JOINT SPACE STRUCTURES LOW LEFT    Standing Status:   Future    Number of Occurrences:   1    Standing Expiration Date:   06/27/2021    Order Specific Question:   Reason for Exam (SYMPTOM  OR DIAGNOSIS REQUIRED)    Answer:   Left knee pain    Order Specific Question:   Preferred imaging location?    Answer:   [facility]  . MR Lumbar Spine Wo Contrast    Standing Status:   Future    Standing Expiration Date:   06/27/2021    Order Specific Question:   What is the patient's sedation requirement?    Answer:   No Sedation    Order Specific Question:   Does the patient have a pacemaker or implanted devices?    Answer:   No    Order Specific Question:   Preferred imaging location?    Answer:   [facility] (table limit-350lbs)    Order Specific Question:   Radiology Contrast Protocol - do NOT remove file path    Answer:   [email or web address]  . Ambulatory referral to Physical Therapy    Referral Priority:   Routine    Referral Type:   Physical Medicine    Referral Reason:   Specialty Services Required    Requested Specialty:   Physical Therapy   No orders of the defined types were placed in this  encounter.    Discussed warning signs or symptoms. Please see discharge instructions. Patient expresses understanding.   The above documentation has been reviewed and is accurate and complete Lynne Leader, M.D.

## 2020-06-28 ENCOUNTER — Ambulatory Visit: Payer: No Typology Code available for payment source | Admitting: Family Medicine

## 2020-07-02 ENCOUNTER — Ambulatory Visit (INDEPENDENT_AMBULATORY_CARE_PROVIDER_SITE_OTHER): Payer: No Typology Code available for payment source

## 2020-07-02 ENCOUNTER — Other Ambulatory Visit: Payer: Self-pay

## 2020-07-02 DIAGNOSIS — M25562 Pain in left knee: Secondary | ICD-10-CM | POA: Diagnosis not present

## 2020-07-02 DIAGNOSIS — M545 Low back pain, unspecified: Secondary | ICD-10-CM

## 2020-07-02 DIAGNOSIS — M5416 Radiculopathy, lumbar region: Secondary | ICD-10-CM | POA: Diagnosis not present

## 2020-07-02 DIAGNOSIS — G8929 Other chronic pain: Secondary | ICD-10-CM | POA: Diagnosis not present

## 2020-07-03 ENCOUNTER — Encounter: Payer: No Typology Code available for payment source | Admitting: Physical Therapy

## 2020-07-03 NOTE — Progress Notes (Signed)
Mild L3 foraminal stenosis is present and mild L4 foraminal stenosis is present.  This could potentially cause pinched nerve in your back causing the leg pain that you experience.  Please return to clinic to discuss results in full detail.

## 2020-07-05 ENCOUNTER — Other Ambulatory Visit: Payer: Self-pay

## 2020-07-05 ENCOUNTER — Encounter: Payer: Self-pay | Admitting: Family Medicine

## 2020-07-05 ENCOUNTER — Ambulatory Visit: Payer: No Typology Code available for payment source | Admitting: Family Medicine

## 2020-07-05 VITALS — BP 102/78 | HR 94 | Ht 68.0 in | Wt 198.2 lb

## 2020-07-05 DIAGNOSIS — G8929 Other chronic pain: Secondary | ICD-10-CM | POA: Diagnosis not present

## 2020-07-05 DIAGNOSIS — M79652 Pain in left thigh: Secondary | ICD-10-CM | POA: Diagnosis not present

## 2020-07-05 DIAGNOSIS — M545 Low back pain, unspecified: Secondary | ICD-10-CM

## 2020-07-05 NOTE — Patient Instructions (Signed)
Thank you for coming in today. I am happy to do more.  It is possible that the nerve in your back could be pinched but this is not super likely on the MRI.  Could do EMG of the thigh if needed but that hurts.  Could do Thigh MRI but that is not likely to show the problem.  I am happy to proceed further in the future if needed.   The important thing is that we communicate and you understand my thought process on next step.

## 2020-07-05 NOTE — Progress Notes (Signed)
I, Wendy Poet, LAT, ATC, am serving as scribe for Dr. Lynne Leader.  Zachary Little is a 39 y.o. male who presents to [place] at [facility] today for f/u of L knee/thigh pain and low back pain and L-spine MRI review.  He was last seen by Dr. Georgina Snell on 06/27/20 w/ c/o L medial knee and L thigh pain associate w/ jogging.  He has a hx of a L knee injury from 2018 but new c/o were thought to be possibly related to a lumbar radiculopathy and an L-spine MRI was ordered.  He was also advised to purchase a knee compression sleeve and to use Voltaren gel.  Since his last visit, pt reports that his L knee pain is feeling better and has been using the Voltaren gel.  Diagnostic imaging: L-spine MRI- 07/02/20; MRI pelvis- 03/04/20; L-spine XR- 01/26/20   Pertinent review of systems: No fevers or chills  Relevant historical information: History leukemia   Exam:  BP 102/78 (BP Location: Right Arm, Patient Position: Sitting, Cuff Size: Normal)   Pulse 94   Ht 5\' 8"  (1.727 m)   Wt 198 lb 3.2 oz (89.9 kg)   SpO2 96%   BMI 30.14 kg/m  General: Well Developed, well nourished, and in no acute distress.   MSK: L-spine normal motion.  Left thigh normal-appearing nontender    Lab and Radiology Results No results found for this or any previous visit (from the past 72 hour(s)). MR Lumbar Spine Wo Contrast  Result Date: 07/03/2020 CLINICAL DATA:  39 year old male with chronic low back pain. Pain radiating to the left buttock and anterior proximal leg. History of leukemia, in remission. EXAM: MRI LUMBAR SPINE WITHOUT CONTRAST TECHNIQUE: Multiplanar, multisequence MR imaging of the lumbar spine was performed. No intravenous contrast was administered. COMPARISON:  [facility] Lumbar MRI 07/09/2016. Lumbar radiographs 01/26/2020. Pelvis MRI 03/04/2020. FINDINGS: Segmentation: Normal on the comparison radiographs which is the same numbering system used on the 2017 MRI. Alignment: Stable  straightening of lumbar lordosis since 2017. No spondylolisthesis. Vertebrae: No marrow edema or evidence of acute osseous abnormality. But T1 marrow signal appears decreased diffusely in the visible spine, mildly more heterogeneous than on the 2017 MRI. Still, no destructive osseous lesion is identified. Visible sacrum and SI joints appear intact. Conus medullaris and cauda equina: Conus extends to the L1 level. No lower spinal cord or conus signal abnormality. Paraspinal and other soft tissues: No lymphadenopathy in the visible retroperitoneum. Visualized abdominal viscera and paraspinal soft tissues are within normal limits. Disc levels: Lower thoracic and lumbar disc signal and morphology remains within normal limits. No disc herniation. At L3-L4 mild facet hypertrophy plus endplate spurring results in borderline to mild L3 foraminal stenosis. At L4-L5 similar mild bilateral foraminal stenosis is noted related to mild endplate and posterior element hypertrophy. At L5-S1 there is trace degenerative facet joint fluid and mild endplate spurring, but no convincing stenosis. IMPRESSION: 1. No lumbar disc degeneration, spinal or lateral recess stenosis. Up to mild L3 and L4 neural foraminal stenosis is related to endplate and facet hypertrophy. 2. Nonspecific decreased T1 marrow signal in the visible spine, similar to a 2017 MRI. Burtis Junes this is the sequelae of leukemia treatment and/or red marrow reactivation such as due to anemia. Electronically Signed   By: Genevie Ann M.D.   On: 07/03/2020 03:54   I, Lynne Leader, personally (independently) visualized and performed the interpretation of the images attached in this note.     Assessment  and Plan: 39 y.o. male with persistent left thigh pain and flank pain.  Patient has had significant imaging already including pelvis C-spine T-spine and L-spine MRIs.  At this point there is no clear obvious explanation.  His current lumbar spine MRI does not show significant  neuroforaminal stenosis or nerve impingement at the relevant levels to provide back pain.  He does have very slight narrowing but my interpretation does not show significant nerve impingement.  It is possible that he is a bit worse when he standing up causes of his symptoms.  Discussed that next steps if needed would be trial of epidural steroid injection for nerve conduction study/EMG or even thigh MRI although these are less appealing.  He would like to proceed with watchful waiting and will let me know if you like to proceed with follow-up tests or treatment if needed in the future.     Discussed warning signs or symptoms. Please see discharge instructions. Patient expresses understanding.   The above documentation has been reviewed and is accurate and complete Lynne Leader, M.D.  Total encounter time 20 minutes including charting time date of service.

## 2020-07-12 ENCOUNTER — Ambulatory Visit: Payer: No Typology Code available for payment source | Admitting: Family Medicine

## 2020-08-14 ENCOUNTER — Other Ambulatory Visit: Payer: Self-pay

## 2020-08-14 ENCOUNTER — Ambulatory Visit (INDEPENDENT_AMBULATORY_CARE_PROVIDER_SITE_OTHER): Payer: No Typology Code available for payment source | Admitting: Physician Assistant

## 2020-08-14 ENCOUNTER — Encounter: Payer: Self-pay | Admitting: Physician Assistant

## 2020-08-14 VITALS — BP 110/80 | HR 58 | Temp 97.9°F | Ht 68.0 in | Wt 201.0 lb

## 2020-08-14 DIAGNOSIS — Z0000 Encounter for general adult medical examination without abnormal findings: Secondary | ICD-10-CM

## 2020-08-14 DIAGNOSIS — R7989 Other specified abnormal findings of blood chemistry: Secondary | ICD-10-CM

## 2020-08-14 DIAGNOSIS — R1013 Epigastric pain: Secondary | ICD-10-CM

## 2020-08-14 DIAGNOSIS — E039 Hypothyroidism, unspecified: Secondary | ICD-10-CM | POA: Diagnosis not present

## 2020-08-14 DIAGNOSIS — Z23 Encounter for immunization: Secondary | ICD-10-CM | POA: Diagnosis not present

## 2020-08-14 DIAGNOSIS — M79675 Pain in left toe(s): Secondary | ICD-10-CM

## 2020-08-14 DIAGNOSIS — C9201 Acute myeloblastic leukemia, in remission: Secondary | ICD-10-CM

## 2020-08-14 DIAGNOSIS — E559 Vitamin D deficiency, unspecified: Secondary | ICD-10-CM

## 2020-08-14 DIAGNOSIS — Z136 Encounter for screening for cardiovascular disorders: Secondary | ICD-10-CM

## 2020-08-14 DIAGNOSIS — Z13220 Encounter for screening for lipoid disorders: Secondary | ICD-10-CM

## 2020-08-14 DIAGNOSIS — E538 Deficiency of other specified B group vitamins: Secondary | ICD-10-CM

## 2020-08-14 DIAGNOSIS — E669 Obesity, unspecified: Secondary | ICD-10-CM

## 2020-08-14 DIAGNOSIS — Z1159 Encounter for screening for other viral diseases: Secondary | ICD-10-CM

## 2020-08-14 NOTE — Patient Instructions (Signed)
It was great to see you!  -You will be contacted about your referral to podiatry -Consider trialing a probiotic and/or omeprazole for your stomach issues; keep me posted  Please go to the lab for blood work.   Our office will call you with your results unless you have chosen to receive results via MyChart.  If your blood work is normal we will follow-up each year for physicals and as scheduled for chronic medical problems.  If anything is abnormal we will treat accordingly and get you in for a follow-up.  Take care,  [facility] Maintenance, Male Adopting a healthy lifestyle and getting preventive care are important in promoting health and wellness. Ask your health care provider about:  The right schedule for you to have regular tests and exams.  Things you can do on your own to prevent diseases and keep yourself healthy. What should I know about diet, weight, and exercise? Eat a healthy diet   Eat a diet that includes plenty of vegetables, fruits, low-fat dairy products, and lean protein.  Do not eat a lot of foods that are high in solid fats, added sugars, or sodium. Maintain a healthy weight Body mass index (BMI) is a measurement that can be used to identify possible weight problems. It estimates body fat based on height and weight. Your health care provider can help determine your BMI and help you achieve or maintain a healthy weight. Get regular exercise Get regular exercise. This is one of the most important things you can do for your health. Most adults should:  Exercise for at least 150 minutes each week. The exercise should increase your heart rate and make you sweat (moderate-intensity exercise).  Do strengthening exercises at least twice a week. This is in addition to the moderate-intensity exercise.  Spend less time sitting. Even light physical activity can be beneficial. Watch cholesterol and blood lipids Have your blood tested for lipids and cholesterol  at 38 years of age, then have this test every 5 years. You may need to have your cholesterol levels checked more often if:  Your lipid or cholesterol levels are high.  You are older than 38 years of age.  You are at high risk for heart disease. What should I know about cancer screening? Many types of cancers can be detected early and may often be prevented. Depending on your health history and family history, you may need to have cancer screening at various ages. This may include screening for:  Colorectal cancer.  Prostate cancer.  Skin cancer.  Lung cancer. What should I know about heart disease, diabetes, and high blood pressure? Blood pressure and heart disease  High blood pressure causes heart disease and increases the risk of stroke. This is more likely to develop in people who have high blood pressure readings, are of African descent, or are overweight.  Talk with your health care provider about your target blood pressure readings.  Have your blood pressure checked: ? Every 3-5 years if you are 51-61 years of age. ? Every year if you are 79 years old or older.  If you are between the ages of 94 and 45 and are a current or former smoker, ask your health care provider if you should have a one-time screening for abdominal aortic aneurysm (AAA). Diabetes Have regular diabetes screenings. This checks your fasting blood sugar level. Have the screening done:  Once every three years after age 33 if you are at a normal weight and  have a low risk for diabetes.  More often and at a younger age if you are overweight or have a high risk for diabetes. What should I know about preventing infection? Hepatitis B If you have a higher risk for hepatitis B, you should be screened for this virus. Talk with your health care provider to find out if you are at risk for hepatitis B infection. Hepatitis C Blood testing is recommended for:  Everyone born from [birth year] through 1964.  Anyone with  known risk factors for hepatitis C. Sexually transmitted infections (STIs)  You should be screened each year for STIs, including gonorrhea and chlamydia, if: ? You are sexually active and are younger than 38 years of age. ? You are older than 38 years of age and your health care provider tells you that you are at risk for this type of infection. ? Your sexual activity has changed since you were last screened, and you are at increased risk for chlamydia or gonorrhea. Ask your health care provider if you are at risk.  Ask your health care provider about whether you are at high risk for HIV. Your health care provider may recommend a prescription medicine to help prevent HIV infection. If you choose to take medicine to prevent HIV, you should first get tested for HIV. You should then be tested every 3 months for as long as you are taking the medicine. Follow these instructions at home: Lifestyle  Do not use any products that contain nicotine or tobacco, such as cigarettes, e-cigarettes, and chewing tobacco. If you need help quitting, ask your health care provider.  Do not use street drugs.  Do not share needles.  Ask your health care provider for help if you need support or information about quitting drugs. Alcohol use  Do not drink alcohol if your health care provider tells you not to drink.  If you drink alcohol: ? Limit how much you have to 0-2 drinks a day. ? Be aware of how much alcohol is in your drink. In the [place] one drink equals one 12 oz bottle of beer (355 mL), one 5 oz glass of wine (148 mL), or one 1 oz glass of hard liquor (44 mL). General instructions  Schedule regular health, dental, and eye exams.  Stay current with your vaccines.  Tell your health care provider if: ? You often feel depressed. ? You have ever been abused or do not feel safe at home. Summary  Adopting a healthy lifestyle and getting preventive care are important in promoting health and  wellness.  Follow your health care provider's instructions about healthy diet, exercising, and getting tested or screened for diseases.  Follow your health care provider's instructions on monitoring your cholesterol and blood pressure. This information is not intended to replace advice given to you by your health care provider. Make sure you discuss any questions you have with your health care provider. Document Revised: 11/04/2018 Document Reviewed: 11/04/2018 Elsevier Patient Education  2020 [employer].

## 2020-08-14 NOTE — Progress Notes (Signed)
I acted as a Education administrator for [employer], PA-C Anselmo Pickler, LPN   Subjective:    Zachary Little is a 39 y.o. male and is here for a comprehensive physical exam.  HPI  Health Maintenance Due  Topic Date Due  . Hepatitis C Screening  Never done  . COVID-19 Vaccine (1) Never done   Acute Concerns: GI complaint -- he reports that he has had some upset stomach sensation for the past month or so. Starts after he takes his oral levothyroxine. Stomach feels unsettled. Has tried anything. Denies: nighttime abdominal pain awakenings, rectal bleeding, n/v/d/c, changes to stools. Recently tried to cut back on artificial sweeteners. L 5th toe pain -- in Dec 2019 he had a xray of his foot which was normal but he was having persistent pain. He had hit his toe twice on the bedpost earlier that year. He reports that he had work-up with orthopedics but everything was normal -- I do not have this report.  Chronic Issues: Vit D deficiency -- does not take regular supplementation Vit B12 supplement --  does not take regular supplementation AML in remission -- currently in remission. Denies any acute concerns. Typically has CBC yearly which he is due for now. Hypothyroidism -- currently on levothyroxine 150 mcg daily. Tolerating well. Denies: unintentional weight loss/gain, changes in energy level Elevated ferritin -- initially found with labs by Dr. Tamala Julian, would like re-evaluation today  Health Maintenance: Immunizations -- UTD; has had COVID vaccine; received flu today Colonoscopy -- N/A PSA -- N/A Diet -- eats all food groups; no sugary beverages Sleep habits -- no concerns Exercise -- very active with cardio and strength training Weight -- Weight: 201 lb (91.2 kg)  Weight history Wt Readings from Last 10 Encounters:  08/14/20 201 lb (91.2 kg)  07/05/20 198 lb 3.2 oz (89.9 kg)  06/27/20 199 lb (90.3 kg)  05/12/20 197 lb (89.4 kg)  04/21/20 197 lb 9.6 oz (89.6 kg)  04/14/20 196 lb (88.9  kg)  01/26/20 192 lb (87.1 kg)  12/15/19 187 lb (84.8 kg)  11/16/19 189 lb (85.7 kg)  10/18/19 188 lb (85.3 kg)   Body mass index is 30.56 kg/m. Mood -- denies concerns Tobacco use -- never   Tobacco Use: Low Risk   . Smoking Tobacco Use: Never Smoker  . Smokeless Tobacco Use: Never Used    Alcohol use ---  reports current alcohol use.   Depression screen PHQ 2/9 08/14/2020  Decreased Interest 0  Down, Depressed, Hopeless 0  PHQ - 2 Score 0  Altered sleeping -  Tired, decreased energy -  Change in appetite -  Feeling bad or failure about yourself  -  Trouble concentrating -  Moving slowly or fidgety/restless -  Suicidal thoughts -  PHQ-9 Score -  Difficult doing work/chores -     Other providers/specialists: Patient Care Team: [name], [place] as PCP - General (Physician Assistant) Lyndee Hensen, PT as Physical Therapist (Physical Therapy)   PMHx, SurgHx, SocialHx, Medications, and Allergies were reviewed in the Visit Navigator and updated as appropriate.   Past Medical History:  Diagnosis Date  . Anxiety   . Blood transfusion without reported diagnosis 2010   During Chemo Treatments  . 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 Thyroid disease      Past Surgical History:  Procedure Laterality Date  . PORT-A-CATH REMOVAL       Family History  Problem Relation Age of Onset  . Healthy Mother   .  Healthy Father   . Multiple sclerosis Maternal Grandmother   . Diabetes Maternal Grandfather   . Diabetes Paternal Grandfather   . Prostate cancer Neg Hx   . Colon cancer Neg Hx     Social History   Tobacco Use  . Smoking status: Never Smoker  . Smokeless tobacco: Never Used  Vaping Use  . Vaping Use: Never used  Substance Use Topics  . Alcohol use: Yes    Comment: "maybe one drink every two years"  . Drug use: No    Review of Systems:   Review of Systems  Constitutional: Negative for chills, fever, malaise/fatigue and weight  loss.  HENT: Negative for hearing loss, sinus pain and sore throat.   Eyes: Negative for blurred vision.  Respiratory: Negative for cough and shortness of breath.   Cardiovascular: Negative for chest pain, palpitations and leg swelling.  Gastrointestinal: Negative for abdominal pain, constipation, diarrhea, heartburn, nausea and vomiting.  Genitourinary: Negative for dysuria, frequency and urgency.  Musculoskeletal: Negative for back pain, myalgias and neck pain.  Skin: Negative for itching and rash.  Neurological: Negative for dizziness, tingling, seizures, loss of consciousness and headaches.  Endo/Heme/Allergies: Negative for polydipsia.  Psychiatric/Behavioral: Negative for depression. The patient is not nervous/anxious.   All other systems reviewed and are negative.   Objective:   Vitals:   08/14/20 1302  BP: 110/80  Pulse: (!) 58  Temp: 97.9 F (36.6 C)  SpO2: 97%   Body mass index is 30.56 kg/m.  General Appearance:  Alert, cooperative, no distress, appears stated age  Head:  Normocephalic, without obvious abnormality, atraumatic  Eyes:  PERRL, conjunctiva/corneas clear, EOM's intact, fundi benign, both eyes       Ears:  Normal TM's and external ear canals, both ears  Nose: Nares normal, septum midline, mucosa normal, no drainage    or sinus tenderness  Throat: Lips, mucosa, and tongue normal; teeth and gums normal  Neck: Supple, symmetrical, trachea midline, no adenopathy; thyroid:  No enlargement/tenderness/nodules; no carotit bruit or JVD  Back:   Symmetric, no curvature, ROM normal, no CVA tenderness  Lungs:   Clear to auscultation bilaterally, respirations unlabored  Chest wall:  No tenderness or deformity  Heart:  Regular rate and rhythm, S1 and S2 normal, no murmur, rub   or gallop  Abdomen:   Soft, non-tender, bowel sounds active all four quadrants, no masses, no organomegaly  Extremities: Extremities normal, atraumatic, no cyanosis or edema  Prostate: Not  done.   Skin: Skin color, texture, turgor normal, no rashes or lesions  Lymph nodes: Cervical, supraclavicular, and axillary nodes normal  Neurologic: CNII-XII grossly intact. Normal strength, sensation and reflexes throughout    Assessment/Plan:   Kaid was seen today for annual exam.  Diagnoses and all orders for this visit:  Routine physical examination Today patient counseled on age appropriate routine health concerns for screening and prevention, each reviewed and up to date or declined. Immunizations reviewed and up to date or declined. Labs ordered and reviewed. Risk factors for depression reviewed and negative. Hearing function and visual acuity are intact. ADLs screened and addressed as needed. Functional ability and level of safety reviewed and appropriate. Education, counseling and referrals performed based on assessed risks today. Patient provided with a copy of personalized plan for preventive services.  Hypothyroidism (acquired) Update TSH today. Will adjust levothyroxine dosage as indicated.  Need for immunization against influenza -     Flu Vaccine QUAD 36+ mos IM  Elevated ferritin Update today.  If remains abnormal, will refer to hematology. -     Ferritin; Future -     Ferritin  Vitamin D deficiency Update labs and recommend supplementation as indicated. -     VITAMIN D 25 Hydroxy (Vit-D Deficiency, Fractures); Future -     VITAMIN D 25 Hydroxy (Vit-D Deficiency, Fractures)  Vitamin B12 deficiency Update labs and recommend supplementation as indicated. -     Vitamin B12; Future -     Vitamin B12  Encounter for screening for other viral diseases -     Hepatitis C antibody; Future -     Hepatitis C antibody  Encounter for lipid screening for cardiovascular disease -     Lipid panel; Future -     Lipid panel  Obesity, unspecified classification, unspecified obesity type, unspecified whether serious comorbidity present Encouraged portion control. Continue  exercise efforts. 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 -     Pathologist smear review; Future -     Pathologist smear review  Pain of toe of L foot Referral to podiatry per patient request.  Dyspepsia Recommended limiting artificial sweeteners (which he is already doing.)  No red flags on exam. Consider oral probiotic and/or trial of prilosec.  Well Adult Exam: Labs ordered: Yes. Patient counseling was done. See below for items discussed. Discussed the patient's BMI.  The BMI is not in the acceptable range; BMI management plan is completed Follow up in one year.  Patient Counseling: [x]   Nutrition: Stressed importance of moderation in sodium/caffeine intake, saturated fat and cholesterol, caloric balance, sufficient intake of fresh fruits, vegetables, and fiber.  [x]   Stressed the importance of regular exercise.   []   Substance Abuse: Discussed cessation/primary prevention of tobacco, alcohol, or other drug use; driving or other dangerous activities under the influence; availability of treatment for abuse.   [x]   Injury prevention: Discussed safety belts, safety helmets, smoke detector, smoking near bedding or upholstery.   []   Sexuality: Discussed sexually transmitted diseases, partner selection, use of condoms, avoidance of unintended pregnancy  and contraceptive alternatives.   [x]   Dental health: Discussed importance of regular tooth brushing, flossing, and dental visits.  [x]   Health maintenance and immunizations reviewed. Please refer to Health maintenance section.    CMA or LPN served as scribe during this visit. History, Physical, and Plan performed by medical provider. The above documentation has been reviewed and is accurate and complete.   [name], PA-C [place]

## 2020-08-15 LAB — CBC WITH DIFFERENTIAL/PLATELET
Absolute Monocytes: 683 cells/uL (ref 200–950)
Basophils Absolute: 39 cells/uL (ref 0–200)
Basophils Relative: 0.7 %
Eosinophils Absolute: 123 cells/uL (ref 15–500)
Eosinophils Relative: 2.2 %
HCT: 48.4 % (ref 38.5–50.0)
Hemoglobin: 16.6 g/dL (ref 13.2–17.1)
Lymphs Abs: 1831 cells/uL (ref 850–3900)
MCH: 31.9 pg (ref 27.0–33.0)
MCHC: 34.3 g/dL (ref 32.0–36.0)
MCV: 92.9 fL (ref 80.0–100.0)
MPV: 11.2 fL (ref 7.5–12.5)
Monocytes Relative: 12.2 %
Neutro Abs: 2923 cells/uL (ref 1500–7800)
Neutrophils Relative %: 52.2 %
Platelets: 189 10*3/uL (ref 140–400)
RBC: 5.21 10*6/uL (ref 4.20–5.80)
RDW: 12.8 % (ref 11.0–15.0)
Total Lymphocyte: 32.7 %
WBC: 5.6 10*3/uL (ref 3.8–10.8)

## 2020-08-15 LAB — COMPREHENSIVE METABOLIC PANEL
AG Ratio: 2 (calc) (ref 1.0–2.5)
ALT: 24 U/L (ref 9–46)
AST: 19 U/L (ref 10–40)
Albumin: 4.5 g/dL (ref 3.6–5.1)
Alkaline phosphatase (APISO): 60 U/L (ref 36–130)
BUN: 17 mg/dL (ref 7–25)
CO2: 27 mmol/L (ref 20–32)
Calcium: 9.8 mg/dL (ref 8.6–10.3)
Chloride: 103 mmol/L (ref 98–110)
Creat: 0.96 mg/dL (ref 0.60–1.35)
Globulin: 2.3 g/dL (calc) (ref 1.9–3.7)
Glucose, Bld: 88 mg/dL (ref 65–99)
Potassium: 4.4 mmol/L (ref 3.5–5.3)
Sodium: 141 mmol/L (ref 135–146)
Total Bilirubin: 0.7 mg/dL (ref 0.2–1.2)
Total Protein: 6.8 g/dL (ref 6.1–8.1)

## 2020-08-15 LAB — FERRITIN: Ferritin: 469 ng/mL — ABNORMAL HIGH (ref 38–380)

## 2020-08-15 LAB — PATHOLOGIST SMEAR REVIEW

## 2020-08-15 LAB — TSH: TSH: 1.67 mIU/L (ref 0.40–4.50)

## 2020-08-15 LAB — HEPATITIS C ANTIBODY
Hepatitis C Ab: NONREACTIVE
SIGNAL TO CUT-OFF: 0.04 (ref <1.00)

## 2020-08-15 LAB — VITAMIN D 25 HYDROXY (VIT D DEFICIENCY, FRACTURES): Vit D, 25-Hydroxy: 22 ng/mL — ABNORMAL LOW (ref 30–100)

## 2020-08-15 LAB — LIPID PANEL
Cholesterol: 158 mg/dL (ref <200)
HDL: 55 mg/dL (ref >=40)
LDL Cholesterol (Calc): 88 mg/dL (calc)
Non-HDL Cholesterol (Calc): 103 mg/dL (calc) (ref <130)
Total CHOL/HDL Ratio: 2.9 (calc) (ref <5.0)
Triglycerides: 68 mg/dL (ref <150)

## 2020-08-15 LAB — VITAMIN B12: Vitamin B-12: 309 pg/mL (ref 200–1100)

## 2020-08-18 ENCOUNTER — Encounter: Payer: Self-pay | Admitting: Family Medicine

## 2020-08-18 DIAGNOSIS — M79652 Pain in left thigh: Secondary | ICD-10-CM

## 2020-08-28 ENCOUNTER — Ambulatory Visit (INDEPENDENT_AMBULATORY_CARE_PROVIDER_SITE_OTHER): Payer: No Typology Code available for payment source | Admitting: Physical Therapy

## 2020-08-28 ENCOUNTER — Encounter: Payer: Self-pay | Admitting: Physical Therapy

## 2020-08-28 ENCOUNTER — Other Ambulatory Visit: Payer: Self-pay

## 2020-08-28 DIAGNOSIS — M79652 Pain in left thigh: Secondary | ICD-10-CM | POA: Diagnosis not present

## 2020-08-28 DIAGNOSIS — M25561 Pain in right knee: Secondary | ICD-10-CM | POA: Diagnosis not present

## 2020-08-28 DIAGNOSIS — M79651 Pain in right thigh: Secondary | ICD-10-CM | POA: Diagnosis not present

## 2020-08-28 DIAGNOSIS — M25562 Pain in left knee: Secondary | ICD-10-CM | POA: Diagnosis not present

## 2020-08-28 NOTE — Patient Instructions (Signed)
Access Code: [email or web address] URL: [email or web address] Date: 08/28/2020 Prepared by: Lyndee Hensen  Exercises Supine Single Knee to Chest - 2 x daily - 3 reps - 30 hold Prone Quadriceps Stretch with Strap - 2 x daily - 3 reps - 30 hold Supine Piriformis Stretch with Leg Straight - 2 x daily - 3 reps - 2 sets - 30 hold Supine Figure 4 Piriformis Stretch with Leg Extension - 2 x daily - 3 reps - 30 hold Seated Piriformis Stretch with Trunk Bend - 2 x daily - 3 reps - 30 hold Side Lunge Adductor Stretch - 2 x daily - 3 reps Modified Thomas Stretch - 2 x daily - 3 reps - 30 hold Half Kneeling Hip Flexor Stretch - 2 x daily - 3 reps - 30 hold Supine Bridge - 1 x daily - 10 reps - 2 sets

## 2020-08-29 ENCOUNTER — Ambulatory Visit (INDEPENDENT_AMBULATORY_CARE_PROVIDER_SITE_OTHER): Payer: No Typology Code available for payment source

## 2020-08-29 ENCOUNTER — Other Ambulatory Visit: Payer: Self-pay

## 2020-08-29 ENCOUNTER — Ambulatory Visit: Payer: No Typology Code available for payment source | Admitting: Podiatry

## 2020-08-29 ENCOUNTER — Other Ambulatory Visit: Payer: Self-pay | Admitting: Podiatry

## 2020-08-29 DIAGNOSIS — M2042 Other hammer toe(s) (acquired), left foot: Secondary | ICD-10-CM

## 2020-08-29 DIAGNOSIS — M21622 Bunionette of left foot: Secondary | ICD-10-CM

## 2020-08-29 DIAGNOSIS — M7752 Other enthesopathy of left foot: Secondary | ICD-10-CM

## 2020-08-29 DIAGNOSIS — M79672 Pain in left foot: Secondary | ICD-10-CM

## 2020-08-29 NOTE — Progress Notes (Signed)
  Subjective:  Patient ID: Zachary Little, male    DOB: [birth year],  MRN: [id number]  Chief Complaint  Patient presents with  . Toe Injury    Right 5th digit repeat injuries 2-3 years ago, pt states on and off pain and has possible bone fragments according to previous xray and Dr. opinion.    39 y.o. male presents with the above complaint. History confirmed with patient.   Objective:  Physical Exam: warm, good capillary refill, no trophic changes or ulcerative lesions, normal DP and PT pulses and normal sensory exam. Left Foot: mild POP left 5th MPJ, no pain at the hammertoe 5th toe; adductovarus 5th toe noted.    No images are attached to the encounter.  Radiographs: X-ray of the left foot: no fracture, dislocation, swelling or degenerative changes noted; synostosis 5th toe left DIPJ. Mild Tailor's bunion deformity Assessment:   1. Capsulitis of metatarsophalangeal (MTP) joint of left foot   2. Tailor's bunion of left foot   3. Hammertoe of left foot    Plan:  Patient was evaluated and treated and all questions answered.  Capsulitis, Tailor's bunion, hammertoe -Educated on etiology -XR reviewed with patient -Injection delivered to the painful joint  Procedure: Joint Injection Location: Left 5th MP joint Skin Prep: Alcohol. Injectate: 0.5 cc 1% lidocaine plain, 0.5 cc dexamethasone phosphate. Disposition: Patient tolerated procedure well. Injection site dressed with a band-aid.  Return in about 1 month (around 09/29/2020) for Capsulitis.

## 2020-09-05 ENCOUNTER — Encounter: Payer: Self-pay | Admitting: Physical Therapy

## 2020-09-05 NOTE — Therapy (Signed)
[name] [address] [place], [place], [address] Phone: [phone]   Fax:  [phone]  Physical Therapy Evaluation  Patient Details  Name: Zachary Little MRN: [id number] Date of Birth: 11-25-1981 Referring Provider (PT): Lynne Leader   Encounter Date: 08/28/2020   PT End of Session - 09/05/20 2242    Visit Number 1    Number of Visits 12    Date for PT Re-Evaluation 10/09/20    Authorization Type [facility]    PT Start Time 1430    PT Stop Time 1515    PT Time Calculation (min) 45 min    Activity Tolerance Patient tolerated treatment well    Behavior During Therapy [facility] for tasks assessed/performed           Past Medical History:  Diagnosis Date  . Anxiety   . Blood transfusion without reported diagnosis 2010   During Chemo Treatments  . 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 Thyroid disease     Past Surgical History:  Procedure Laterality Date  . PORT-A-CATH REMOVAL      There were no vitals filed for this visit.    Subjective Assessment - 09/05/20 2231    Subjective Pt states Increased pain in bil thighs, and bil medial knee. Sitting increases pain in bil thighs/quads. States knee pain has improved some. Has been able to exercise, trying to do more lately bc he notes weight gain in last few months.    Pertinent History seen previously for hip pain, R leg numb at night, neck pain, knee pain, and chronic back/rib pain. Previous leukemia 2010    Limitations Sitting;House hold activities    Patient Stated Goals Decreased pain    Currently in Pain? Yes    Pain Score 4     Pain Location Leg    Pain Orientation Left;Right    Pain Descriptors / Indicators Aching;Burning;Sore    Pain Type Acute pain    Pain Onset More than a month ago    Pain Frequency Intermittent    Aggravating Factors  sitting, increased exercise, running              [facility] PT Assessment - 09/05/20 0001      Assessment    Medical Diagnosis Thigh pain, knee pain    Referring Provider (PT) Lynne Leader    Prior Therapy yes      Balance Screen   Has the patient fallen in the past 6 months No      Prior Function   Level of Independence Independent      Cognition   Overall Cognitive Status Within Functional Limits for tasks assessed      AROM   Overall AROM Comments Mod limitation for bil hip flexion and IR,;  Knee ROM: WNL      Strength   Overall Strength Comments Knees: 5/5, Hips: 4/5       Palpation   Palpation comment Hypomobile hip flex and IR;  Mild tightness in bil quads. No pain to palpate bil quads; Mild soreness at medial knee on L.                       Objective measurements completed on examination: See above findings.       [facility] Adult PT Treatment/Exercise - 09/05/20 0001      Exercises   Exercises Knee/Hip      Knee/Hip Exercises: Stretches   Quad Stretch 2 reps;30  seconds    Quad Stretch Limitations prone w strap    Hip Flexor Stretch 2 reps;30 seconds    Hip Flexor Stretch Limitations kneeling, and supine thomas test position     Piriformis Stretch 2 reps;30 seconds    Piriformis Stretch Limitations seated and supine     Other Knee/Hip Stretches Lateral lunge, adductor stretch x2 min                   PT Education - 09/05/20 2242    Education Details PT POC, Exam findings, HEP    Person(s) Educated Patient    Methods Explanation;Demonstration;Tactile cues;Handout;Verbal cues    Comprehension Verbalized understanding;Returned demonstration;Verbal cues required;Tactile cues required;Need further instruction            PT Short Term Goals - 09/05/20 2244      PT SHORT TERM GOAL #1   Title Pt to be independent with initial HEP     Time 2    Period Weeks    Status New    Target Date 09/11/20             PT Long Term Goals - 09/05/20 2247      PT LONG TERM GOAL #1   Title Pt to report decreased pain in bil thighs to 0-1/10    Time 6     Period Weeks    Status New    Target Date 10/09/20      PT LONG TERM GOAL #2   Title Pt to report decreased pain in bil knees with activity and exercise to 0-1/10    Time 6    Period Weeks    Status New    Target Date 10/09/20      PT LONG TERM GOAL #3   Title Pt to be independent with final HEP for  knees and thighs    Time 6    Period Weeks    Status New    Target Date 10/09/20      PT LONG TERM GOAL #4   Title Pt to demo proper mechanics for squatting for decreased pain with exercise    Time 6    Period Weeks    Status New    Target Date 10/09/20                  Plan - 09/05/20 2254    Clinical Impression Statement Pt presents wtih primary complaint of increased pain in bil thighs and knees. Knee pain has improved some since MD visit. Pt continues to have limtiations for hip ROM as seen in previous episodes of PT, that may be causing referred pain in thighs. Reviewed best stretches and mobility for hips from previous HEP. Minimal pain to palpate or reproduce in quads today. Pt to benefit from skilled PT to improve deficits and pain. Pt to benefit from review of LE mechanics for exercise and squats.    Personal Factors and Comorbidities Time since onset of injury/illness/exacerbation    Examination-Activity Limitations Bed Mobility;Bend;Sit;Sleep    Examination-Participation Restrictions Driving    Stability/Clinical Decision Making Stable/Uncomplicated    Rehab Potential Good    PT Frequency 2x / week    PT Duration 6 weeks    PT Treatment/Interventions ADLs/Self Care Home Management;Electrical Stimulation;Cryotherapy;Iontophoresis 4mg /ml Dexamethasone;Moist Heat;Traction;Ultrasound;Therapeutic exercise;Therapeutic activities;Functional mobility training;Stair training;Gait training;Balance training;Neuromuscular re-education;Patient/family education;Manual techniques;Vasopneumatic Device;Taping;Dry needling;Passive range of motion;Spinal Manipulations;Joint  Manipulations    PT Home Exercise Plan [id number]    Consulted and Agree with Plan of  Care Patient           Patient will benefit from skilled therapeutic intervention in order to improve the following deficits and impairments:  Improper body mechanics, Pain, Increased muscle spasms, Decreased activity tolerance, Decreased strength  Visit Diagnosis: Pain in left thigh  Pain in right thigh  Acute pain of left knee  Acute pain of right knee     Problem List Patient Active Problem List   Diagnosis Date Noted  . Thoracolumbar back pain 01/26/2020  . Chronic pain syndrome 12/29/2019  . Neuropathic pain of thigh, right 12/29/2019  . Lateral femoral cutaneous entrapment syndrome, right 12/29/2019  . 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 Neuropathic pain involving lateral femoral cutaneous nerve, right 12/29/2019  . Nonallopathic lesion of thoracic region 11/16/2019  . Nonallopathic lesion of cervical region 11/16/2019  . Nonallopathic lesion of rib cage 11/16/2019  . Trigger point of right shoulder region 11/16/2019  . Scapular dyskinesis 10/06/2019  . Quadriceps strain, left, initial encounter 10/06/2019  . Depression, major, single episode, complete remission ([place]) 03/09/2018  . Erectile dysfunction 03/09/2018  . Obesity 03/09/2018  . Abnormal gait 01/02/2018  . 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 Osteoarthritis of spine 11/26/2017  . Knee pain 10/31/2017  . Thoracic spondylosis 10/28/2017  . Thoracic radiculopathy 05/23/2017  . Meralgia paresthetica of right side 05/23/2017  . AML (acute myeloid leukemia) in remission ([place]) 05/16/2015  . Hypothyroidism (acquired) 05/16/2015  . Vitamin B12 deficiency 10/14/2011   Lyndee Hensen, PT, DPT 11:22 PM  09/05/20    [facility] [place], [place], [address] Phone: [phone]   Fax:  [phone]  Name: Vincen Bejar MRN:  [id number] Date of Birth: 1981-06-30

## 2020-09-06 ENCOUNTER — Encounter: Payer: No Typology Code available for payment source | Admitting: Physical Therapy

## 2020-09-11 ENCOUNTER — Other Ambulatory Visit: Payer: Self-pay | Admitting: Physician Assistant

## 2020-09-11 MED ORDER — LEVOTHYROXINE SODIUM 150 MCG PO TABS: 150.0000 ug | ORAL_TABLET | Freq: Every day | ORAL | 0 refills | Status: DC

## 2020-09-11 MED FILL — LEVOTHYROXINE SODIUM 150 MC: 150 | 90 days supply | Qty: 90 | Fill #0

## 2020-09-12 ENCOUNTER — Encounter: Payer: Self-pay | Admitting: Physical Therapy

## 2020-09-12 ENCOUNTER — Ambulatory Visit: Payer: No Typology Code available for payment source | Admitting: Physical Therapy

## 2020-09-12 ENCOUNTER — Other Ambulatory Visit: Payer: Self-pay

## 2020-09-12 DIAGNOSIS — M25562 Pain in left knee: Secondary | ICD-10-CM | POA: Diagnosis not present

## 2020-09-12 DIAGNOSIS — M25561 Pain in right knee: Secondary | ICD-10-CM

## 2020-09-12 DIAGNOSIS — M79652 Pain in left thigh: Secondary | ICD-10-CM | POA: Diagnosis not present

## 2020-09-12 DIAGNOSIS — M79651 Pain in right thigh: Secondary | ICD-10-CM | POA: Diagnosis not present

## 2020-09-12 NOTE — Therapy (Signed)
[facility] [address] [place], [place], [address] Phone: [phone]   Fax:  [phone]  Physical Therapy Treatment  Patient Details  Name: Zachary Little MRN: [id number] Date of Birth: 16-Apr-1981 Referring Provider (PT): Lynne Leader   Encounter Date: 09/12/2020   PT End of Session - 09/12/20 1227    Visit Number 2    Number of Visits 12    Date for PT Re-Evaluation 10/09/20    Authorization Type [facility]    PT Start Time 1215    PT Stop Time 1255    PT Time Calculation (min) 40 min    Activity Tolerance Patient tolerated treatment well    Behavior During Therapy [facility] for tasks assessed/performed           Past Medical History:  Diagnosis Date  . Anxiety   . Blood transfusion without reported diagnosis 2010   During Chemo Treatments  . 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 Thyroid disease     Past Surgical History:  Procedure Laterality Date  . PORT-A-CATH REMOVAL      There were no vitals filed for this visit.   Subjective Assessment - 09/12/20 1225    Subjective States continued soreness in L quad/thigh. Notes increased pain when sitting with feet flat on ground. If he elevates his legs while sitting, pain is better. Has been able to run and exercise, without pain.  Knee pain seems to be better.    Currently in Pain? Yes    Pain Score 4     Pain Location Leg    Pain Orientation Left    Pain Descriptors / Indicators Aching;Tightness    Pain Type Acute pain                             [facility] Adult PT Treatment/Exercise - 09/12/20 1228      Exercises   Exercises Knee/Hip      Knee/Hip Exercises: Stretches   Quad Stretch 2 reps;30 seconds    Quad Stretch Limitations manual    Hip Flexor Stretch --    Hip Flexor Stretch Limitations --    Piriformis Stretch 2 reps;30 seconds    Piriformis Stretch Limitations seated     Other Knee/Hip Stretches Lateral lunge, adductor  stretch x2 min       Knee/Hip Exercises: Aerobic   Recumbent Bike L2 x 7 min       Knee/Hip Exercises: Seated   Long Arc Quad 20 reps;Left    Long Arc Quad Weight 5 lbs.      Knee/Hip Exercises: Supine   Straight Leg Raises 15 reps;Both      Manual Therapy   Manual Therapy Joint mobilization;Passive ROM;Soft tissue mobilization    Soft tissue mobilization DTM/IASTM to L quad    Passive ROM Hip flex and IR stretching/ROM                    PT Short Term Goals - 09/05/20 2244      PT SHORT TERM GOAL #1   Title Pt to be independent with initial HEP     Time 2    Period Weeks    Status New    Target Date 09/11/20             PT Long Term Goals - 09/05/20 2247      PT LONG TERM GOAL #1   Title Pt  to report decreased pain in bil thighs to 0-1/10    Time 6    Period Weeks    Status New    Target Date 10/09/20      PT LONG TERM GOAL #2   Title Pt to report decreased pain in bil knees with activity and exercise to 0-1/10    Time 6    Period Weeks    Status New    Target Date 10/09/20      PT LONG TERM GOAL #3   Title Pt to be independent with final HEP for  knees and thighs    Time 6    Period Weeks    Status New    Target Date 10/09/20      PT LONG TERM GOAL #4   Title Pt to demo proper mechanics for squatting for decreased pain with exercise    Time 6    Period Weeks    Status New    Target Date 10/09/20                 Plan - 09/12/20 1518    Clinical Impression Statement Pt with minimal soreness in quad with palpation or DTM today. With discussion of when thigh is painful, it seems that pain may be stemming from hip vs quad. No pain in quad with active contraction, stretch or palpation. Progressed ther ex today for hip and quad strengthening. Plan to progress as tolerated.    Personal Factors and Comorbidities Time since onset of injury/illness/exacerbation    Examination-Activity Limitations Bed Mobility;Bend;Sit;Sleep     Examination-Participation Restrictions Driving    Stability/Clinical Decision Making Stable/Uncomplicated    Rehab Potential Good    PT Frequency 2x / week    PT Duration 6 weeks    PT Treatment/Interventions ADLs/Self Care Home Management;Electrical Stimulation;Cryotherapy;Iontophoresis 4mg /ml Dexamethasone;Moist Heat;Traction;Ultrasound;Therapeutic exercise;Therapeutic activities;Functional mobility training;Stair training;Gait training;Balance training;Neuromuscular re-education;Patient/family education;Manual techniques;Vasopneumatic Device;Taping;Dry needling;Passive range of motion;Spinal Manipulations;Joint Manipulations    PT Home Exercise Plan [id number]    Consulted and Agree with Plan of Care Patient           Patient will benefit from skilled therapeutic intervention in order to improve the following deficits and impairments:  Improper body mechanics, Pain, Increased muscle spasms, Decreased activity tolerance, Decreased strength  Visit Diagnosis: Pain in left thigh  Pain in right thigh  Acute pain of left knee  Acute pain of right knee     Problem List Patient Active Problem List   Diagnosis Date Noted  . Thoracolumbar back pain 01/26/2020  . Chronic pain syndrome 12/29/2019  . Neuropathic pain of thigh, right 12/29/2019  . Lateral femoral cutaneous entrapment syndrome, right 12/29/2019  . 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 Neuropathic pain involving lateral femoral cutaneous nerve, right 12/29/2019  . Nonallopathic lesion of thoracic region 11/16/2019  . Nonallopathic lesion of cervical region 11/16/2019  . Nonallopathic lesion of rib cage 11/16/2019  . Trigger point of right shoulder region 11/16/2019  . Scapular dyskinesis 10/06/2019  . Quadriceps strain, left, initial encounter 10/06/2019  . Depression, major, single episode, complete remission ([place]) 03/09/2018  . Erectile dysfunction 03/09/2018  . Obesity 03/09/2018  . Abnormal gait  01/02/2018  . 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 Osteoarthritis of spine 11/26/2017  . Knee pain 10/31/2017  . Thoracic spondylosis 10/28/2017  . Thoracic radiculopathy 05/23/2017  . Meralgia paresthetica of right side 05/23/2017  . AML (acute myeloid leukemia) in remission ([place]) 05/16/2015  . Hypothyroidism (acquired)  05/16/2015  . Vitamin B12 deficiency 10/14/2011    Lyndee Hensen, PT, DPT 3:21 PM  09/12/20    [facility] [place], [place], [address] Phone: [phone]   Fax:  [phone]  Name: Zachary Little MRN: [id number] Date of Birth: 21-Feb-1981

## 2020-10-03 ENCOUNTER — Encounter: Payer: No Typology Code available for payment source | Admitting: Physical Therapy

## 2020-10-05 ENCOUNTER — Ambulatory Visit: Payer: No Typology Code available for payment source | Admitting: Physical Therapy

## 2020-10-05 ENCOUNTER — Other Ambulatory Visit: Payer: Self-pay

## 2020-10-05 DIAGNOSIS — M79651 Pain in right thigh: Secondary | ICD-10-CM | POA: Diagnosis not present

## 2020-10-05 DIAGNOSIS — M79652 Pain in left thigh: Secondary | ICD-10-CM

## 2020-10-05 DIAGNOSIS — M25562 Pain in left knee: Secondary | ICD-10-CM | POA: Diagnosis not present

## 2020-10-05 DIAGNOSIS — M25561 Pain in right knee: Secondary | ICD-10-CM | POA: Diagnosis not present

## 2020-10-08 ENCOUNTER — Encounter: Payer: Self-pay | Admitting: Physical Therapy

## 2020-10-08 NOTE — Therapy (Addendum)
[facility] [address] [place], [place], [address] Phone: [phone]   Fax:  [phone]  Physical Therapy Treatment  Patient Details  Name: Zachary Little MRN: [id number] Date of Birth: [birth year] Referring Provider (PT): Lynne Leader   Encounter Date: 10/05/2020   PT End of Session - 10/08/20 2059     Visit Number 3    Number of Visits 12    Date for PT Re-Evaluation 10/09/20    Authorization Type [facility]    PT Start Time 0800    PT Stop Time 0845    PT Time Calculation (min) 45 min    Activity Tolerance Patient tolerated treatment well    Behavior During Therapy [facility] for tasks assessed/performed             Past Medical History:  Diagnosis Date   Anxiety    Blood transfusion without reported diagnosis 2010   During Chemo Treatments   Leukemia, acute myeloid, 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   Subjective Assessment - 10/08/20 2055     Subjective Pt last seen 10/19. He has been trying to do HEP, but feels stretches are not helping. Notes continued pain down front of L hip. He has been trying to increase cardio, unable to run on treadmill-gets pain after. Also states increased discomfort after doing hip flexion stretches. Has been able to do light strengthening without pain.    Currently in Pain? Yes    Pain Score 4     Pain Location Leg    Pain Orientation Left    Pain Descriptors / Indicators Aching;Radiating    Pain Type Acute pain    Pain Onset More than a month ago    Pain Frequency Intermittent                               [facility] Adult PT Treatment/Exercise - 10/08/20 0001       Exercises   Exercises Knee/Hip;Lumbar      Lumbar Exercises: Stretches   Single Knee to Chest Stretch 3 reps;30 seconds    Other Lumbar Stretch Exercise Prone on  elbows x 2 min;       Lumbar Exercises: Aerobic   Recumbent Bike L1 x 8 min       Lumbar Exercises: Standing   Other Standing Lumbar Exercises lumbar extension x 15      Lumbar Exercises: Supine   Clam 20 reps    Bridge 20 reps      Lumbar Exercises: Sidelying   Hip Abduction 15 reps;Both      Knee/Hip Exercises: Stretches   Sports administrator 2 reps;30 seconds    Quad Stretch Limitations manual    Piriformis Stretch 2 reps;30 seconds    Piriformis Stretch Limitations seated     Other Knee/Hip Stretches --      Knee/Hip Exercises: Aerobic   Recumbent Bike --      Knee/Hip Exercises: Seated   Long Arc Quad 20 reps;Left    Long Arc Quad Weight 5 lbs.      Knee/Hip Exercises: Supine   Straight Leg Raises --      Manual Therapy   Manual Therapy Joint mobilization;Passive ROM;Soft tissue mobilization    Soft tissue mobilization STM/Roller to L  quad    Passive ROM --                    PT Education - 10/08/20 2059     Education Details Reviewed final HEP in detail.    Person(s) Educated Patient    Methods Explanation;Demonstration;Verbal cues;Handout    Comprehension Verbalized understanding;Returned demonstration;Verbal cues required              PT Short Term Goals - 10/08/20 2100       PT SHORT TERM GOAL #1   Title Pt to be independent with initial HEP     Time 2    Period Weeks    Status Achieved    Target Date 09/11/20               PT Long Term Goals - 10/08/20 2101       PT LONG TERM GOAL #1   Title Pt to report decreased pain in bil thighs to 0-1/10    Time 6    Period Weeks    Status Not Met      PT LONG TERM GOAL #2   Title Pt to report decreased pain in bil knees with activity and exercise to 0-1/10    Time 6    Period Weeks    Status Partially Met      PT LONG TERM GOAL #3   Title Pt to be independent with final HEP for  knees and thighs    Time 6    Period Weeks    Status Achieved      PT LONG TERM GOAL #4   Title  Pt to demo proper mechanics for squatting for decreased pain with exercise    Time 6    Period Weeks    Status Achieved                   Plan - 10/08/20 2102     Clinical Impression Statement Pt continues to have radicular pain into L thigh. Minimal pain to palpate quad. Quad length not limited and able to perform resisted quad activitiy without increased pain. Pt getting increased pain from sitting, flexion, and increased cardio activity, treadmill. Seems likely that pain would be stemming from back, but pt has had multiple imaging that is negative. Does continue to have significant ROM limtiations for hip flexion and IR for pt age, which may be contributory for hip impingement. Hip mobilization and ther ex in PT has not been helpful to reduce pain. Discussed referral back to MD, pt in agreement with plan.    Personal Factors and Comorbidities Time since onset of injury/illness/exacerbation    Examination-Activity Limitations Bed Mobility;Bend;Sit;Sleep    Examination-Participation Restrictions Driving    Stability/Clinical Decision Making Stable/Uncomplicated    Rehab Potential Good    PT Frequency 2x / week    PT Duration 6 weeks    PT Treatment/Interventions ADLs/Self Care Home Management;Electrical Stimulation;Cryotherapy;Iontophoresis 40m/ml Dexamethasone;Moist Heat;Traction;Ultrasound;Therapeutic exercise;Therapeutic activities;Functional mobility training;Stair training;Gait training;Balance training;Neuromuscular re-education;Patient/family education;Manual techniques;Vasopneumatic Device;Taping;Dry needling;Passive range of motion;Spinal Manipulations;Joint Manipulations    PT Home Exercise Plan [id number]   Consulted and Agree with Plan of Care Patient             Patient will benefit from skilled therapeutic intervention in order to improve the following deficits and impairments:  Improper body mechanics, Pain, Increased muscle spasms, Decreased activity tolerance,  Decreased strength  Visit Diagnosis: Pain in left thigh  Pain in right thigh  Acute pain of left knee  Acute pain of right knee     Problem List Patient Active Problem List   Diagnosis Date Noted   Thoracolumbar back pain 01/26/2020   Chronic pain syndrome 12/29/2019   Neuropathic pain of thigh, right 12/29/2019   Lateral femoral cutaneous entrapment syndrome, right 12/29/2019   Lateral femoral cutaneous neuropathy, right 12/29/2019   Neuropathic pain involving lateral femoral cutaneous nerve, right 12/29/2019   Nonallopathic lesion of thoracic region 11/16/2019   Nonallopathic lesion of cervical region 11/16/2019   Nonallopathic lesion of rib cage 11/16/2019   Trigger point of right shoulder region 11/16/2019   Scapular dyskinesis 10/06/2019   Quadriceps strain, left, initial encounter 10/06/2019   Depression, major, single episode, complete remission ([place]) 03/09/2018   Erectile dysfunction 03/09/2018   Obesity 03/09/2018   Abnormal gait 01/02/2018   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/02/2018   Osteoarthritis of spine 11/26/2017   Knee pain 10/31/2017   Thoracic spondylosis 10/28/2017   Thoracic radiculopathy 05/23/2017   Meralgia paresthetica of right side 05/23/2017   AML (acute myeloid leukemia) in remission ([place]) 05/16/2015   Hypothyroidism (acquired) 05/16/2015   Vitamin B12 deficiency 10/14/2011   Lyndee Hensen, PT, DPT 9:14 PM  10/08/20    [place], [place], [address] Phone: [phone]   Fax:  [phone]  Name: Zachary Little MRN: [id number] Date of Birth: 1981/02/02  PHYSICAL THERAPY DISCHARGE SUMMARY  Visits from Start of Care:3 Plan: Patient agrees to discharge.  Patient goals were not met. Patient is being discharged due to - referred back to MD after visit 3.     Lyndee Hensen, PT, DPT 11:37 AM  01/01/23

## 2020-10-10 ENCOUNTER — Ambulatory Visit: Payer: No Typology Code available for payment source | Admitting: Podiatry

## 2020-10-13 ENCOUNTER — Ambulatory Visit (INDEPENDENT_AMBULATORY_CARE_PROVIDER_SITE_OTHER): Payer: No Typology Code available for payment source | Admitting: Podiatry

## 2020-10-13 ENCOUNTER — Other Ambulatory Visit: Payer: Self-pay

## 2020-10-13 DIAGNOSIS — M21622 Bunionette of left foot: Secondary | ICD-10-CM | POA: Diagnosis not present

## 2020-10-13 DIAGNOSIS — M7752 Other enthesopathy of left foot: Secondary | ICD-10-CM | POA: Diagnosis not present

## 2020-10-13 DIAGNOSIS — M2042 Other hammer toe(s) (acquired), left foot: Secondary | ICD-10-CM | POA: Diagnosis not present

## 2020-10-24 ENCOUNTER — Ambulatory Visit: Payer: No Typology Code available for payment source | Admitting: Podiatry

## 2020-10-24 NOTE — Progress Notes (Signed)
  Subjective:  Patient ID: [name], male    DOB: 1981-08-27,  MRN: [id number]  Chief Complaint  Patient presents with  . Follow-up    capsulitis has improved, does not feel the injection was helpful    39 y.o. male presents with the above complaint. History confirmed with patient.   Objective:  Physical Exam: warm, good capillary refill, no trophic changes or ulcerative lesions, normal DP and PT pulses and normal sensory exam. Left Foot: mild POP left 5th MPJ, no pain at the hammertoe 5th toe; adductovarus 5th toe noted.    Assessment:   1. Capsulitis of metatarsophalangeal (MTP) joint of left foot   2. Tailor's bunion of left foot   3. Hammertoe of left foot    Plan:  Patient was evaluated and treated and all questions answered.  Capsulitis, Tailor's bunion, hammertoe -No repeat injection today.  Continue padding and shoe gear changes.  Follow-up should issues persist.  Would consider surgical invention consider tailor's bunionectomy hammertoe correction.  No follow-ups on file.

## 2020-11-03 ENCOUNTER — Encounter: Payer: Self-pay | Admitting: Physician Assistant

## 2020-11-03 ENCOUNTER — Other Ambulatory Visit: Payer: Self-pay

## 2020-11-03 ENCOUNTER — Ambulatory Visit (INDEPENDENT_AMBULATORY_CARE_PROVIDER_SITE_OTHER): Payer: No Typology Code available for payment source | Admitting: Physician Assistant

## 2020-11-03 VITALS — BP 118/80 | HR 51 | Temp 97.7°F | Resp 18 | Ht 68.0 in | Wt 198.6 lb

## 2020-11-03 DIAGNOSIS — R1032 Left lower quadrant pain: Secondary | ICD-10-CM | POA: Diagnosis not present

## 2020-11-03 NOTE — Progress Notes (Signed)
Zachary Little is a 39 y.o. male here for a new problem.   History of Present Illness:   Chief Complaint  Patient presents with  . Groin Pain    X 1 month. He feels the pain mostly after her exercises. 5/10 on the pain after exercising. 1/10 pain scale at present. Has tried to otc ibuprofen and some resting. Goes away after 3-4 hours resting.     HPI   Groin pain Patient reports groin pain on his left side. Started about a month ago. At first it was after activities such as running but with time, it has begun to occur during even less intense activities such as brisk walking. Pain is 5/10. Does not radiate into testicles. Does have a tender area that he is able to reproduce at times. No bulge or mass. Has occasional constipation. Does not do heavy squats or lifting.   Past Medical History:  Diagnosis Date  . Anxiety   . Blood transfusion without reported diagnosis 2010   During Chemo Treatments  . 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 Thyroid disease      Social History   Tobacco Use  . Smoking status: Never Smoker  . Smokeless tobacco: Never Used  Vaping Use  . Vaping Use: Never used  Substance Use Topics  . Alcohol use: Yes    Comment: "maybe one drink every two years"  . Drug use: No    Past Surgical History:  Procedure Laterality Date  . PORT-A-CATH REMOVAL      Family History  Problem Relation Age of Onset  . Healthy Mother   . Healthy Father   . Multiple sclerosis Maternal Grandmother   . Diabetes Maternal Grandfather   . Diabetes Paternal Grandfather   . Prostate cancer Neg Hx   . Colon cancer Neg Hx     No Known Allergies  Current Medications:   Current Outpatient Medications:  .  levothyroxine (SYNTHROID) 150 MCG tablet, Take 1 tablet (150 mcg total) by mouth daily., Disp: 90 tablet, Rfl: 0   Review of Systems:   ROS  Negative unless otherwise specified per HPI.  Vitals:   Vitals:   11/03/20 0755  BP: 118/80  Pulse: (!)  51  Resp: 18  Temp: 97.7 F (36.5 C)  TempSrc: Temporal  SpO2: 97%  Weight: 198 lb 9.6 oz (90.1 kg)  Height: 5\' 8"  (1.727 m)     Body mass index is 30.2 kg/m.  Physical Exam:   Physical Exam Vitals and nursing note reviewed. Exam conducted with a chaperone present [place]).  Constitutional:      General: He is not in acute distress.    Appearance: He is well-developed. He is not ill-appearing, toxic-appearing or sickly-appearing.  Cardiovascular:     Rate and Rhythm: Normal rate and regular rhythm.     Pulses: Normal pulses.     Heart sounds: Normal heart sounds, S1 normal and S2 normal.     Comments: No LE edema Pulmonary:     Effort: Pulmonary effort is normal.     Breath sounds: Normal breath sounds.  Abdominal:     Hernia: There is no hernia in the left inguinal area or right inguinal area.  Genitourinary:    Comments: Cystic lesion to L lateral scrotum Lymphadenopathy:     Lower Body: No right inguinal adenopathy. No left inguinal adenopathy.  Skin:    General: Skin is warm, dry and intact.  Neurological:     Mental Status:  He is alert.     GCS: GCS eye subscore is 4. GCS verbal subscore is 5. GCS motor subscore is 6.  Psychiatric:        Mood and Affect: Mood and affect normal.        Speech: Speech normal.        Behavior: Behavior normal. Behavior is cooperative.       Assessment and Plan:   Zachary Little was seen today for groin pain.  Diagnoses and all orders for this visit:  Left inguinal pain -     US Pelvis Complete; Future   No red flags on exam.  No evidence of acute abdomen. No palpable abnormality on my exam. Will order pelvic ultrasound for more information. Avoid any triggering activities in the meantime.   [name], PA-C

## 2020-11-03 NOTE — Patient Instructions (Signed)
It was great to see you!  We will be in touch with scheduling your ultrasound. If you do not hear anything within the next week or so please let us know.

## 2020-11-04 ENCOUNTER — Encounter: Payer: Self-pay | Admitting: Physician Assistant

## 2020-11-13 ENCOUNTER — Other Ambulatory Visit: Payer: No Typology Code available for payment source

## 2020-12-08 ENCOUNTER — Other Ambulatory Visit: Payer: Self-pay | Admitting: Physician Assistant

## 2020-12-08 MED FILL — LEVOTHYROXINE SODIUM 150 MC: 150 | 90 days supply | Qty: 90 | Fill #0

## 2020-12-22 ENCOUNTER — Telehealth: Payer: Self-pay

## 2020-12-22 NOTE — Telephone Encounter (Signed)
Spouse has called in requesting to know why TSH for patient was billed serrately from other labs.    I will send email to [place] with quest.

## 2020-12-25 ENCOUNTER — Ambulatory Visit: Payer: Self-pay

## 2020-12-25 ENCOUNTER — Other Ambulatory Visit: Payer: Self-pay

## 2020-12-25 ENCOUNTER — Ambulatory Visit (INDEPENDENT_AMBULATORY_CARE_PROVIDER_SITE_OTHER): Payer: 59 | Admitting: Family Medicine

## 2020-12-25 VITALS — BP 108/76 | HR 74 | Ht 68.0 in | Wt 203.0 lb

## 2020-12-25 DIAGNOSIS — G5711 Meralgia paresthetica, right lower limb: Secondary | ICD-10-CM | POA: Diagnosis not present

## 2020-12-25 DIAGNOSIS — S46319A Strain of muscle, fascia and tendon of triceps, unspecified arm, initial encounter: Secondary | ICD-10-CM

## 2020-12-25 NOTE — Progress Notes (Signed)
I, Zachary Little, LAT, ATC acting as a scribe for Zachary Leader, MD.  Zachary Little is a 40 y.o. male who presents to [place] at [facility] today for arm pain. Pt was last seen by Dr. Georgina Little on 07/05/20 for L-sided LBP and L thigh pain. Today, pt reports R arm pain ongoing for about a week. MOI: Pt was doing triceps extensions. Pt locates pain to posterior aspect of upper arm, mid humerus.   Additionally he notes persistent numbness in his right thigh thought to be meralgia paresthetica.  This is bothersome and interfering with his quality of life  Additionally he notes significant bothersome aches and pains over his body including into his left flank, left leg and knee.  Radiates: no UE numbness/tingling: no UE weakness: no Aggravates: sleeping, arm hanging down Rx tried: IBU   Pertinent review of systems: No fevers or chills  Relevant historical information: History of AML   Exam:  BP 108/76 (BP Location: Left Arm, Patient Position: Sitting, Cuff Size: Normal)   Pulse 74   Ht 5\' 8"  (1.727 m)   Wt 203 lb (92.1 kg)   SpO2 98%   BMI 30.87 kg/m  General: Well Developed, well nourished, and in no acute distress.   MSK: C-spine normal-appearing nontender normal cervical motion. Right shoulder normal-appearing normal motion. Intact strength abduction external and internal rotation. Negative Hawkins and Neer's test. Negative empty can test.  Intact strength to elbow flexion and extension. Upper arm is nontender.  Right groin: Normal.  Nontender    Lab and Radiology Results  Diagnostic Limited MSK Ultrasound of: Right shoulder Biceps tendon intact normal-appearing Subscapularis tendon intact normal-appearing Supraspinatus and is intact.  Small amount of subacromial bursitis present. Infraspinatus tendon is normal. AC joint normal-appearing Impression: Mild subacromial bursitis  Procedure: Real-time Ultrasound Guided hydrodissection of right  lateral femoral cutaneous nerve at ASIS Device: Philips Affiniti 50G Images permanently stored and available for review in PACS Verbal informed consent obtained.  Discussed risks and benefits of procedure. Warned about infection bleeding damage to structures skin hypopigmentation and fat atrophy among others. Patient expresses understanding and agreement Time-out conducted.   Noted no overlying erythema, induration, or other signs of local infection.   Skin prepped in a sterile fashion.   Local anesthesia: Topical Ethyl chloride.   With sterile technique and under real time ultrasound guidance:  40 mg of Kenalog and 2 mL of Marcaine injected around lateral femoral cutaneous nerve. Fluid seen entering the soft tissue around the nerve.   Completed without difficulty   Pain immediately resolved suggesting accurate placement of the medication.   Advised to call if fevers/chills, erythema, induration, drainage, or persistent bleeding.   Images permanently stored and available for review in the ultrasound unit.  Impression: Technically successful ultrasound guided injection.        Assessment and Plan: 39 y.o. male with right shoulder pain after weight lifting.  Exacerbation of shoulder bursitis over the last rotator cuff tendinopathy.  Plan for reteaching home exercise program in clinic today.  Consider repeat physical therapy if needed.  Check back in the future if needed.  Meralgia paresthetica right.  Persistent.  Plan for diagnostic and hopefully therapeutic injection today.  Patient did have reduction in symptoms immediately following injection.  Possible fibromyalgia.  I think it is likely that Zachary Little has fibromyalgia.  He has already had a rheumatologic work-up which was unrevealing.  Provided him with some reading and he will think about potential next  steps of treatment.  He notes that he is already been on Cymbalta in the past and did not tolerate it well.  Lyrica would be a reasonable  next choice.  PDMP not reviewed this encounter. Orders Placed This Encounter  Procedures  . [place] LIMITED JOINT SPACE STRUCTURES UP RIGHT(NO LINKED CHARGES)    Standing Status:   Future    Number of Occurrences:   1    Standing Expiration Date:   06/24/2021    Order Specific Question:   Reason for Exam (SYMPTOM  OR DIAGNOSIS REQUIRED)    Answer:   triceps pain    Order Specific Question:   Preferred imaging location?    Answer:   [facility]   No orders of the defined types were placed in this encounter.    Discussed warning signs or symptoms. Please see discharge instructions. Patient expresses understanding.   The above documentation has been reviewed and is accurate and complete Zachary Little, M.D.

## 2020-12-25 NOTE — Patient Instructions (Addendum)
Thank you for coming in today.  Call or go to the ER if you develop a large red swollen joint with extreme pain or oozing puss.   Work on the shoulder PT you learned previously.  Please complete the exercises that the athletic trainer went over with you: View at [email or web address] using code: [id number]   See how the leg feels today and over the next few days after the injection.   Read about fibromyalgia.    Myofascial Pain Syndrome and Fibromyalgia Myofascial pain syndrome and fibromyalgia are both pain disorders. This pain may be felt mainly in your muscles.  Myofascial pain syndrome: ? Always has tender points in the muscle that will cause pain when pressed (trigger points). The pain may come and go. ? Usually affects your neck, upper back, and shoulder areas. The pain often radiates into your arms and hands.  Fibromyalgia: ? Has muscle pains and tenderness that come and go. ? Is often associated with fatigue and sleep problems. ? Has trigger points. ? Tends to be long-lasting (chronic), but is not life-threatening. Fibromyalgia and myofascial pain syndrome are not the same. However, they often occur together. If you have both conditions, each can make the other worse. Both are common and can cause enough pain and fatigue to make day-to-day activities difficult. Both can be hard to diagnose because their symptoms are common in many other conditions. What are the causes? The exact causes of these conditions are not known. What increases the risk? You are more likely to develop this condition if:  You have a family history of the condition.  You have certain triggers, such as: ? Spine disorders. ? An injury (trauma) or other physical stressors. ? Being under a lot of stress. ? Medical conditions such as osteoarthritis, rheumatoid arthritis, or lupus. What are the signs or symptoms? Fibromyalgia The main symptom of fibromyalgia is widespread pain and tenderness in your  muscles. Pain is sometimes described as stabbing, shooting, or burning. You may also have:  Tingling or numbness.  Sleep problems and fatigue.  Problems with attention and concentration (fibro fog). Other symptoms may include:  Bowel and bladder problems.  Headaches.  Visual problems.  Problems with odors and noises.  Depression or mood changes.  Painful menstrual periods (dysmenorrhea).  Dry skin or eyes. These symptoms can vary over time. Myofascial pain syndrome Symptoms of myofascial pain syndrome include:  Tight, ropy bands of muscle.  Uncomfortable sensations in muscle areas. These may include aching, cramping, burning, numbness, tingling, and weakness.  Difficulty moving certain parts of the body freely (poor range of motion). How is this diagnosed? This condition may be diagnosed by your symptoms and medical history. You will also have a physical exam. In general:  Fibromyalgia is diagnosed if you have pain, fatigue, and other symptoms for more than 3 months, and symptoms cannot be explained by another condition.  Myofascial pain syndrome is diagnosed if you have trigger points in your muscles, and those trigger points are tender and cause pain elsewhere in your body (referred pain). How is this treated? Treatment for these conditions depends on the type that you have.  For fibromyalgia: ? Pain medicines, such as NSAIDs. ? Medicines for treating depression. ? Medicines for treating seizures. ? Medicines that relax the muscles.  For myofascial pain: ? Pain medicines, such as NSAIDs. ? Cooling and stretching of muscles. ? Trigger point injections. ? Sound wave (ultrasound) treatments to stimulate muscles. Treating these conditions often requires a team of health  care providers. These may include:  Your primary care provider.  Physical therapist.  Complementary health care providers, such as massage therapists or acupuncturists.  Psychiatrist for  cognitive behavioral therapy.   Follow these instructions at home: Medicines  Take over-the-counter and prescription medicines only as told by your health care provider.  Do not drive or use heavy machinery while taking prescription pain medicine.  If you are taking prescription pain medicine, take actions to prevent or treat constipation. Your health care provider may recommend that you: ? Drink enough fluid to keep your urine pale yellow. ? Eat foods that are high in fiber, such as fresh fruits and vegetables, whole grains, and beans. ? Limit foods that are high in fat and processed sugars, such as fried or sweet foods. ? Take an over-the-counter or prescription medicine for constipation. Lifestyle  Exercise as directed by your health care provider or physical therapist.  Practice relaxation techniques to control your stress. You may want to try: ? Biofeedback. ? Visual imagery. ? Hypnosis. ? Muscle relaxation. ? Yoga. ? Meditation.  Maintain a healthy lifestyle. This includes eating a healthy diet and getting enough sleep.  Do not use any products that contain nicotine or tobacco, such as cigarettes and e-cigarettes. If you need help quitting, ask your health care provider.   General instructions  Talk to your health care provider about complementary treatments, such as acupuncture or massage.  Consider joining a support group with others who are diagnosed with this condition.  Do not do activities that stress or strain your muscles. This includes repetitive motions and heavy lifting.  Keep all follow-up visits as told by your health care provider. This is important. Where to find more information  National Fibromyalgia Association: www.fmaware.[place]: www.arthritis.org  American Chronic Pain Association: www.theacpa.org Contact a health care provider if:  You have new symptoms.  Your symptoms get worse or your pain is severe.  You have side  effects from your medicines.  You have trouble sleeping.  Your condition is causing depression or anxiety. Summary  Myofascial pain syndrome and fibromyalgia are pain disorders.  Myofascial pain syndrome has tender points in the muscle that will cause pain when pressed (trigger points). Fibromyalgia also has muscle pains and tenderness that come and go, but this condition is often associated with fatigue and sleep disturbances.  Fibromyalgia and myofascial pain syndrome are not the same but often occur together, causing pain and fatigue that make day-to-day activities difficult.  Treatment for fibromyalgia includes taking medicines to relax the muscles and medicines for pain, depression, or seizures. Treatment for myofascial pain syndrome includes taking medicines for pain, cooling and stretching of muscles, and injecting medicines into trigger points.  Follow your health care provider's instructions for taking medicines and maintaining a healthy lifestyle. This information is not intended to replace advice given to you by your health care provider. Make sure you discuss any questions you have with your health care provider. Document Revised: 03/05/2019 Document Reviewed: 11/26/2017 Elsevier Patient Education  2021 [employer].

## 2021-01-08 ENCOUNTER — Encounter: Payer: Self-pay | Admitting: Physician Assistant

## 2021-01-08 ENCOUNTER — Other Ambulatory Visit: Payer: Self-pay

## 2021-01-08 ENCOUNTER — Ambulatory Visit (INDEPENDENT_AMBULATORY_CARE_PROVIDER_SITE_OTHER): Payer: 59 | Admitting: Physician Assistant

## 2021-01-08 ENCOUNTER — Other Ambulatory Visit: Payer: Self-pay | Admitting: Physician Assistant

## 2021-01-08 VITALS — BP 100/70 | HR 70 | Temp 97.9°F | Ht 68.0 in | Wt 202.2 lb

## 2021-01-08 DIAGNOSIS — M792 Neuralgia and neuritis, unspecified: Secondary | ICD-10-CM

## 2021-01-08 DIAGNOSIS — G4700 Insomnia, unspecified: Secondary | ICD-10-CM | POA: Diagnosis not present

## 2021-01-08 MED ORDER — DIAZEPAM 5 MG PO TABS: 5.0000 mg | ORAL_TABLET | Freq: Two times a day (BID) | ORAL | 0 refills | Status: DC | PRN

## 2021-01-08 MED ORDER — TRAZODONE HCL 50 MG PO TABS: 25.0000 mg | ORAL_TABLET | Freq: Every evening | ORAL | 3 refills | Status: DC | PRN

## 2021-01-08 MED FILL — traZODone HCL 50 MG TABS: 50 | 30 days supply | Qty: 30 | Fill #0

## 2021-01-08 MED FILL — diazePAM 5 MG TABS: 5 | 15 days supply | Qty: 30 | Fill #0

## 2021-01-08 NOTE — Patient Instructions (Addendum)
Start trazodone 25mg . May increase to 50mg  as tolerated. You have permission to increase up to 100 mg nightly if needed.  Other medication options to consider in the future if this is unsuccessful: hydroxyzine, temazepam  I am putting in a referral to [facility] to see if they can help [place] figure out next steps.    Sleep Hygiene  Do: (1) Go to bed at the same time each day. (2) Get up from bed at the same time each day. (3) Get regular exercise each day, preferably in the morning.  There is goof evidence that regular exercise improves restful sleep.  This includes stretching and aerobic exercise. (4) Get regular exposure to outdoor or bright lights, especially in the late afternoon. (5) Keep the temperature in your bedroom comfortable. (6) Keep the bedroom quiet when sleeping. (7) Keep the bedroom dark enough to facilitate sleep. (8) Use your bed only for sleep and sex. (9) Take medications as directed.  It is helpful to take prescribed sleeping pills 1 hour before bedtime, so they are causing drowsiness when you lie down, or 10 hours before getting up, to avoid daytime drowsiness. (10) Use a relaxation exercise just before going to sleep -- imagery, massage, warm bath. (11) Keep your feet and hands warm.  Wear warm socks and/or mittens or gloves to bed.  Don't: (1) Exercise just before going to bed. (2) Engage in stimulating activity just before bed, such as playing a competitive game, watching an exciting program on television, or having an important discussion with a loved one. (3) Have caffeine in the evening (coffee, teas, chocolate, sodas, etc.) (4) Read or watch television in bed. (5) Use alcohol to help you sleep. (6) Go to bed too hungry or too full. (7) Take another person's sleeping pills. (8) Take over-the-counter sleeping pills, without your doctor's knowledge.  Tolerance can develop rapidly with these medications.  Diphenhydramine can have serious side effects  for elderly patients. (9) Take daytime naps. (10) Command yourself to go to sleep.  This only makes your mind and body more alert.  If you lie awake for more than 20-30 minutes, get up, go to a different room, participate in a quiet activity (Ex - non-excitable reading or television), and then return to bed when you feel sleepy.  Do this as many times during the night as needed.  This may cause you to have a night or two of poor sleep but it will train your brain to know when it is time for sleep.

## 2021-01-08 NOTE — Progress Notes (Signed)
Zachary Little is a 40 y.o. male here for a new problem.  I acted as a Education administrator for [employer], PA-C Zachary Pickler, LPN   History of Present Illness:   Chief Complaint  Patient presents with  . Insomnia    HPI   Insomnia Pt c/o trouble sleeping x 6 yrs. He has trouble falling asleep. He is sleeping between 7-9 hours once he falls asleep. He has tried Melatonin without relief. Has tried Valium but it does not work at night -- only makes him sleepy during the day. Has used medication in the past for pain but did not notice any improvement with sleep while taking these medications -- including nortriptyline, lyrica, cymbalta, or gabapentin.  Sleep routine -- gets in bed 10-12p. Sometimes doesn't fall asleep for a couple of hours. Sleeps in dark, cold and quiet room. He does toss and turn due to pain. Wakes up around 730-800 without an alarm. No caffeine. Works out in the mornings.  Falling asleep without difficulty. If he does wake up, feels like he can go back to sleep pretty easily. Doesn't nap regularly.    Ongoing chronic pain Has been seeing Dr. Lynne Little with [facility] for evaluation. Still has not had significant diagnosis or effective intervention. Wondering what the next steps are. Takes valium twice a month. Doesn't want chronic pain meds.     Past Medical History:  Diagnosis Date  . Anxiety   . Blood transfusion without reported diagnosis 2010   During Chemo Treatments  . 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 Thyroid disease      Social History   Tobacco Use  . Smoking status: Never Smoker  . Smokeless tobacco: Never Used  Vaping Use  . Vaping Use: Never used  Substance Use Topics  . Alcohol use: Yes    Comment: "maybe one drink every two years"  . Drug use: No    Past Surgical History:  Procedure Laterality Date  . PORT-A-CATH REMOVAL      Family History  Problem Relation Age of Onset  . Healthy Mother   . Healthy  Father   . Multiple sclerosis Maternal Grandmother   . Diabetes Maternal Grandfather   . Diabetes Paternal Grandfather   . Prostate cancer Neg Hx   . Colon cancer Neg Hx     No Known Allergies  Current Medications:   Current Outpatient Medications:  .  diazepam (VALIUM) 5 MG tablet, Take 1 tablet (5 mg total) by mouth every 12 (twelve) hours as needed for muscle spasms., Disp: 30 tablet, Rfl: 0 .  levothyroxine (SYNTHROID) 150 MCG tablet, TAKE 1 TABLET (150 MCG TOTAL) BY MOUTH DAILY., Disp: 90 tablet, Rfl: 0 .  traZODone (DESYREL) 50 MG tablet, Take 0.5-1 tablets (25-50 mg total) by mouth at bedtime as needed for sleep., Disp: 30 tablet, Rfl: 3 .  VITAMIN D PO, Take 2,500 Units by mouth daily in the afternoon., Disp: , Rfl:    Review of Systems:   ROS  Negative unless otherwise specified per HPI.  Vitals:   Vitals:   01/08/21 0736  BP: 100/70  Pulse: 70  Temp: 97.9 F (36.6 C)  TempSrc: Temporal  SpO2: 98%  Weight: 202 lb 4 oz (91.7 kg)  Height: 5\' 8"  (1.727 m)     Body mass index is 30.75 kg/m.  Physical Exam:   Physical Exam Vitals and nursing note reviewed.  Constitutional:      General: He is not  in acute distress.    Appearance: He is well-developed. He is not ill-appearing, toxic-appearing or sickly-appearing.  Cardiovascular:     Rate and Rhythm: Normal rate and regular rhythm.     Pulses: Normal pulses.     Heart sounds: Normal heart sounds, S1 normal and S2 normal.     Comments: No LE edema Pulmonary:     Effort: Pulmonary effort is normal.     Breath sounds: Normal breath sounds.  Skin:    General: Skin is warm, dry and intact.  Neurological:     Mental Status: He is alert.     GCS: GCS eye subscore is 4. GCS verbal subscore is 5. GCS motor subscore is 6.  Psychiatric:        Mood and Affect: Mood and affect normal.        Speech: Speech normal.        Behavior: Behavior normal. Behavior is cooperative.     Assessment and Plan:   [name] was  seen today for insomnia.  Diagnoses and all orders for this visit:  Insomnia, unspecified type Discussed recommendations regarding going to a different room for non-excitable reading if in bed without sleeping for >20 min. Also will trial 25 mg trazodone with permission to increase up to 100 mg gradually. Follow-up as needed.  Neuropathic pain of thigh, right Difficult case. Has seen multiple providers. Referral to physiatry for further evaluation and management.   Other orders -     diazepam (VALIUM) 5 MG tablet; Take 1 tablet (5 mg total) by mouth every 12 (twelve) hours as needed for muscle spasms. -     traZODone (DESYREL) 50 MG tablet; Take 0.5-1 tablets (25-50 mg total) by mouth at bedtime as needed for sleep.   CMA or LPN served as scribe during this visit. History, Physical, and Plan performed by medical provider. The above documentation has been reviewed and is accurate and complete.  Zachary Coke, PA-C

## 2021-01-12 ENCOUNTER — Other Ambulatory Visit: Payer: Self-pay

## 2021-01-12 ENCOUNTER — Encounter: Payer: 59 | Attending: Physical Medicine and Rehabilitation | Admitting: Physical Medicine and Rehabilitation

## 2021-01-12 ENCOUNTER — Encounter: Payer: Self-pay | Admitting: Physical Medicine and Rehabilitation

## 2021-01-12 VITALS — BP 113/78 | HR 76 | Temp 98.1°F | Ht 68.0 in | Wt 198.2 lb

## 2021-01-12 DIAGNOSIS — T148XXA Other injury of unspecified body region, initial encounter: Secondary | ICD-10-CM | POA: Insufficient documentation

## 2021-01-12 NOTE — Progress Notes (Addendum)
Subjective:    Patient ID: Zachary Little, male    DOB: 09-May-1981, 40 y.o.   MRN: [id number]  HPI  1) Left sided muscle strain, thoracic and lumbar:  -[name] is a a 40 year old man who presents to establish care for left sided lower thoracic, upper lumbar pain. He feels it when he tries to sleep, and when he drives. The pain feels dull. On average it is 5/10, pain right now is 1/10. The pain is intermittent, dull, and aching. He has tried Ibuprofen which doesn't help. He takes Valium once or twice per month with benefit. He went to PT with [facility] and it did not help much. Side kettle bell swings and side bends make the pain worse. Not necessarily worse when he bends forward. He has tried heat and cold and it hasn't helped much.   2) Meralgia paresthetica: -  Pain Inventory Average Pain 5 Pain Right Now 1 My pain is intermittent, dull and aching  In the last 24 hours, has pain interfered with the following? General activity 0 Relation with others 0 Enjoyment of life 0 What TIME of day is your pain at its worst? night Sleep (in general) Poor  Pain is worse with: sitting and some activites Pain improves with: rest and medication Relief from Meds: 5  walk without assistance ability to climb steps?  yes do you drive?  yes  not employed: date last employed .  numbness  Any changes since last visit?  no  Primary care Zachary Coke PA    Family History  Problem Relation Age of Onset  . Healthy Mother   . Healthy Father   . Multiple sclerosis Maternal Grandmother   . Diabetes Maternal Grandfather   . Diabetes Paternal Grandfather   . Prostate cancer Neg Hx   . Colon cancer Neg Hx    Social History   Socioeconomic History  . Marital status: Single    Spouse name: Not on file  . Number of children: 0  . Years of education: college  . Highest education level: Not on file  Occupational History  . Occupation: Disabled  Tobacco Use  . Smoking status:  Never Smoker  . Smokeless tobacco: Never Used  Vaping Use  . Vaping Use: Never used  Substance and Sexual Activity  . Alcohol use: Yes    Comment: "maybe one drink every two years"  . Drug use: No  . Sexual activity: Yes    Partners: Female  Other Topics Concern  . Not on file  Social History Narrative   Lives at home with his wife.   Left-handed.   No daily caffeine use.    Social Determinants of Health   Financial Resource Strain: Not on file  Food Insecurity: Not on file  Transportation Needs: Not on file  Physical Activity: Not on file  Stress: Not on file  Social Connections: Not on file   Past Surgical History:  Procedure Laterality Date  . PORT-A-CATH REMOVAL     Past Medical History:  Diagnosis Date  . Anxiety   . Blood transfusion without reported diagnosis 2010   During Chemo Treatments  . 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 Thyroid disease    BP 113/78   Pulse 76   Temp 98.1 F (36.7 C)   Ht 5\' 8"  (1.727 m)   Wt 198 lb 3.2 oz (89.9 kg)   SpO2 98%   BMI 30.14 kg/m   Opioid Risk  Score:   Fall Risk Score:  `1  Depression screen PHQ 2/9  Depression screen [facility] 2/9 01/12/2021 08/14/2020 06/02/2019 04/01/2018 03/09/2018 08/26/2017  Decreased Interest 0 0 0 0 0 1  Down, Depressed, Hopeless 0 0 0 0 0 1  PHQ - 2 Score 0 0 0 0 0 2  Altered sleeping 3 - 1 - - 3  Tired, decreased energy 1 - 1 - - 0  Change in appetite 0 - 0 - - 0  Feeling bad or failure about yourself  0 - 0 - - 1  Trouble concentrating 0 - 0 - - 0  Moving slowly or fidgety/restless 0 - 0 - - 0  Suicidal thoughts 0 - 0 - - 0  PHQ-9 Score 4 - 2 - - 6  Difficult doing work/chores Somewhat difficult - Not difficult at all - - Somewhat difficult    Review of Systems  Constitutional: Negative.   HENT: Negative.   Eyes: Negative.   Respiratory: Negative.   Cardiovascular: Negative.   Gastrointestinal: Negative.   Endocrine: Negative.   Genitourinary: Negative.    Musculoskeletal: Positive for back pain and myalgias.  Skin: Negative.   Allergic/Immunologic: Negative.   Neurological: Positive for numbness.  Hematological: Negative.   Psychiatric/Behavioral: Negative.   All other systems reviewed and are negative.      Objective:   Physical Exam Gen: no distress, normal appearing HEENT: oral mucosa pink and moist, NCAT Cardio: Reg rate Chest: normal effort, normal rate of breathing Abd: soft, non-distended Ext: no edema Psych: pleasant, normal affect Skin: intact Neuro: Alert and oriented x3  Musculoskeletal:Tenderness to palpation over left thoracic and lumbar myofascial tenderness     Assessment & Plan:  1) Chronic Pain Syndrome secondary to left muscle strain in left lower thoracic and upper lumbar muscle strain.  -Discussed current symptoms of pain and history of pain.  -Discussed benefits of exercise in reducing pain. -Recommended core exercises, hot showers, eating magnesium  -Consider doing Down Dog yoga app to increase flexibility -Recommended that his current Valium be used sparingly given its addictive potential. Discussed alternative anti-spasmodics but he has tried and did not have good results with these.  -Discussed following foods that may reduce pain: 1) Ginger 2) Blueberries 3) Salmon 4) Pumpkin seeds 5) dark chocolate 6) turmeric 7) tart cherries 8) virgin olive oil 9) chilli peppers 10) mint  Link to further information on diet for chronic pain: http://[email or web address].randall.com/  2) Meralgia paresthetica: -Discussed benefits of weight loss -Recommended intermittent fasting  3) Obesity: -Educated that current weight is____ and current BMI is___ -Educated regarding health benefits of weight loss- for pain, general health, chronic disease prevention, immune health, mental health.  -Will monitor weight every visit.  -Consider Ro[place]ois tea daily.   -Discussed the benefits of intermittent fasting. -Discussed foods that can assist in weight loss:  1) Eggs  2) Leafy greens  3) Salmon  4) Cruciferous vegetables  5) Lean beef and chicken breast  6) Boiled potatoes  7) Tuna  8) Beans and legumes  9) Soups  10) Cottage cheese  11) Avocados  12) Apple cider vinegar  13) Nuts  14) Whole grains  15) Chili pepper  16) Fruit- berries are some of the best  17) Grapefruit  18) Chia seeds  19) Coconut oil  20) Full-fat yogurt  -Discussed supplements that can be used:  1) Metatrim 400mg  BID 30 minutes before breakfast and dinner  2) Sphaeranthus indicus and Garcinia mangostana (combinations of these and #  1 can be found in capsicum and zychrome  3) green coffee bean extract 400mg  twice per day or Irvingia (african mango) 150 to 300mg  twice per day. -Made goal to___

## 2021-01-12 NOTE — Patient Instructions (Addendum)
-  Recommended core exercises, hot showers, eating magnesium  -Consider doing Down Dog yoga app to increase flexibility -Foods that relieve pain: 1) Ginger 2) Blueberries 3) Salmon 4) Pumpkin seeds 5) dark chocolate 6) turmeric 7) tart cherries 8) virgin olive oil 9) chilli peppers 10) mint  Weight loss for meralgia paresthetica- can try intermittent fasting Foods that can help with weight loss:   1) Eggs  2) Leafy greens  3) Salmon  4) Cruciferous vegetables  5) Lean beef and chicken breast  6) Boiled potatoes  7) Tuna  8) Beans and legumes  9) Soups  10) Cottage cheese  11) Avocados  12) Apple cider vinegar  13) Nuts  14) Whole grains  15) Chili pepper  16) Fruit- berries are some of the best  17) Grapefruit  18) Chia seeds  19) Coconut oil  20) Full-fat yogurt    ITB stretching and strengthening of quadriceps

## 2021-03-13 ENCOUNTER — Other Ambulatory Visit (HOSPITAL_COMMUNITY): Payer: Self-pay

## 2021-03-13 ENCOUNTER — Other Ambulatory Visit: Payer: Self-pay | Admitting: Physician Assistant

## 2021-03-13 MED ORDER — LEVOTHYROXINE SODIUM 150 MCG PO TABS
150.0000 ug | ORAL_TABLET | Freq: Every day | ORAL | 0 refills | Status: DC
  Filled 2021-03-13: qty 90, 90d supply, fill #0

## 2021-05-11 ENCOUNTER — Other Ambulatory Visit (HOSPITAL_COMMUNITY): Payer: Self-pay

## 2021-05-11 ENCOUNTER — Other Ambulatory Visit: Payer: Self-pay

## 2021-05-11 ENCOUNTER — Ambulatory Visit: Payer: 59 | Admitting: Family

## 2021-05-11 ENCOUNTER — Encounter: Payer: Self-pay | Admitting: Family

## 2021-05-11 VITALS — BP 105/69 | HR 61 | Temp 98.0°F | Ht 68.0 in | Wt 201.0 lb

## 2021-05-11 DIAGNOSIS — G4700 Insomnia, unspecified: Secondary | ICD-10-CM | POA: Diagnosis not present

## 2021-05-11 DIAGNOSIS — G894 Chronic pain syndrome: Secondary | ICD-10-CM | POA: Diagnosis not present

## 2021-05-11 DIAGNOSIS — G8929 Other chronic pain: Secondary | ICD-10-CM | POA: Diagnosis not present

## 2021-05-11 DIAGNOSIS — E039 Hypothyroidism, unspecified: Secondary | ICD-10-CM | POA: Diagnosis not present

## 2021-05-11 DIAGNOSIS — M545 Low back pain, unspecified: Secondary | ICD-10-CM | POA: Diagnosis not present

## 2021-05-11 MED ORDER — NORTRIPTYLINE HCL 25 MG PO CAPS
25.0000 mg | ORAL_CAPSULE | Freq: Every day | ORAL | 3 refills | Status: DC
  Filled 2021-05-11: qty 30, 30d supply, fill #0

## 2021-05-13 NOTE — Progress Notes (Signed)
Acute Office Visit  Subjective:    Patient ID: Zachary Little, male    DOB: August 04, 1981, 40 y.o.   MRN: [id number]  Chief Complaint  Patient presents with   Insomnia    Due to body pain  Rt Leg, left side of back     HPI Patient is in today with persistent complaints of insomnia related to chronic left sided back pain, right leg numbness x 5-6 years. He has been under the care of Neurology, and has seen 3 different neurologist along with having Xrays, MRIs with no known etiology for symptoms. Patient has also seen orthopedics. He has been on several medications to include Gabapentin, Lyrica, Cymbalta, nortriptyline. Nortriptyline helped some and he would like to try it again. Valium also.   Also reports bilateral wrist pain that comes and goes. No known injury. Would like a referral to orthopedics.   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   Thyroid disease     Past Surgical History:  Procedure Laterality Date   PORT-A-CATH REMOVAL      Family History  Problem Relation Age of Onset   Healthy Mother    Healthy Father    Multiple sclerosis Maternal Grandmother    Diabetes Maternal Grandfather    Diabetes Paternal Grandfather    Prostate cancer Neg Hx    Colon cancer Neg Hx     Social History   Socioeconomic History   Marital status: Single    Spouse name: Not on file   Number of children: 0   Years of education: college   Highest education level: Not on file  Occupational History   Occupation: Disabled  Tobacco Use   Smoking status: Never   Smokeless tobacco: Never  Vaping Use   Vaping Use: Never used  Substance and Sexual Activity   Alcohol use: Yes    Comment: "maybe one drink every two years"   Drug use: No   Sexual activity: Yes    Partners: Female  Other Topics Concern   Not on file  Social History Narrative    Lives at home with his wife.   Left-handed.   No daily caffeine use.    Social Determinants of Health   Financial Resource Strain: Not on file  Food Insecurity: Not on file  Transportation Needs: Not on file  Physical Activity: Not on file  Stress: Not on file  Social Connections: Not on file  Intimate Partner Violence: Not on file    Outpatient Medications Prior to Visit  Medication Sig Dispense Refill   diazepam (VALIUM) 5 MG tablet TAKE 1 TABLET (5 MG TOTAL) BY MOUTH EVERY 12 (TWELVE) HOURS AS NEEDED FOR MUSCLE SPASMS. 30 tablet 0   levothyroxine (SYNTHROID) 150 MCG tablet Take 1 tablet (150 mcg total) by mouth daily. 90 tablet 0   VITAMIN D PO Take 2,500 Units by mouth daily in the afternoon.     No facility-administered medications prior to visit.    Allergies  Allergen Reactions   Trazodone And Nefazodone Nausea Only    Review of Systems  Constitutional: Negative.   Respiratory: Negative.    Cardiovascular: Negative.   Endocrine: Negative.   Musculoskeletal:  Positive for back pain. Negative for joint swelling.       Wrist pain bilaterally   Skin: Negative.   Allergic/Immunologic: Negative.   Neurological:  Positive for weakness and numbness. Negative for dizziness.  Hematological: Negative.   Psychiatric/Behavioral:  Positive for sleep disturbance. Negative for suicidal ideas.        Health conditions are making him depressed.   All other systems reviewed and are negative.     Objective:    Physical Exam Vitals and nursing note reviewed.  Constitutional:      Appearance: Normal appearance.  HENT:     Head: Normocephalic.  Cardiovascular:     Rate and Rhythm: Normal rate and regular rhythm.     Pulses: Normal pulses.     Heart sounds: Normal heart sounds.  Pulmonary:     Effort: Pulmonary effort is normal.     Breath sounds: Normal breath sounds.  Abdominal:     General: Abdomen is flat.     Palpations: Abdomen is soft.  Musculoskeletal:         General: Normal range of motion.     Cervical back: Normal range of motion and neck supple.  Skin:    General: Skin is warm.  Neurological:     General: No focal deficit present.     Mental Status: He is alert and oriented to person, place, and time.  Psychiatric:        Mood and Affect: Mood normal.        Behavior: Behavior normal.    BP 105/69   Pulse 61   Temp 98 F (36.7 C) (Temporal)   Ht 5\' 8"  (1.727 m)   Wt 201 lb (91.2 kg)   SpO2 97%   BMI 30.56 kg/m  Wt Readings from Last 3 Encounters:  05/11/21 201 lb (91.2 kg)  01/12/21 198 lb 3.2 oz (89.9 kg)  01/08/21 202 lb 4 oz (91.7 kg)    There are no preventive care reminders to display for this patient.  There are no preventive care reminders to display for this patient.   Lab Results  Component Value Date   TSH 1.67 08/14/2020   Lab Results  Component Value Date   WBC 5.6 08/14/2020   HGB 16.6 08/14/2020   HCT 48.4 08/14/2020   MCV 92.9 08/14/2020   PLT 189 08/14/2020   Lab Results  Component Value Date   NA 141 08/14/2020   K 4.4 08/14/2020   CO2 27 08/14/2020   GLUCOSE 88 08/14/2020   BUN 17 08/14/2020   CREATININE 0.96 08/14/2020   BILITOT 0.7 08/14/2020   ALKPHOS 61 11/17/2019   AST 19 08/14/2020   ALT 24 08/14/2020   PROT 6.8 08/14/2020   ALBUMIN 4.5 11/17/2019   CALCIUM 9.8 08/14/2020   GFR 95.92 11/17/2019   Lab Results  Component Value Date   CHOL 158 08/14/2020   Lab Results  Component Value Date   HDL 55 08/14/2020   Lab Results  Component Value Date   LDLCALC 88 08/14/2020   Lab Results  Component Value Date   TRIG 68 08/14/2020   Lab Results  Component Value Date   CHOLHDL 2.9 08/14/2020   No results found for: HGBA1C     Assessment & Plan:   Problem List Items Addressed This Visit     Chronic pain syndrome (Chronic)   Relevant Medications   nortriptyline (PAMELOR) 25 MG capsule   Other Relevant Orders   Ambulatory referral to Pain Clinic   Hypothyroidism  (acquired)   Relevant Orders   TSH   Other Visit Diagnoses     Chronic left-sided low back pain without sciatica    -  Primary   Relevant Orders   Ambulatory referral to Pain Clinic        Meds ordered this encounter  Medications   nortriptyline (PAMELOR) 25 MG capsule    Sig: Take 1 capsule (25 mg total) by mouth at bedtime.    Dispense:  30 capsule    Refill:  3   Refer to pain clinic for management of chronic pain. Refilled nortriptyline. Call the office with any questions or concerns.   Kennyth Arnold, FNP

## 2021-06-04 ENCOUNTER — Encounter: Payer: Self-pay | Admitting: Sports Medicine

## 2021-06-04 ENCOUNTER — Ambulatory Visit (INDEPENDENT_AMBULATORY_CARE_PROVIDER_SITE_OTHER): Payer: 59 | Admitting: Sports Medicine

## 2021-06-04 ENCOUNTER — Other Ambulatory Visit: Payer: Self-pay

## 2021-06-04 DIAGNOSIS — M792 Neuralgia and neuritis, unspecified: Secondary | ICD-10-CM

## 2021-06-04 NOTE — Assessment & Plan Note (Addendum)
This is a pleasant 40 year old male with history of acute myelogenous leukemia, he has a years long history of pain in his left low back as well as numbness and tingling predominantly the anterior right thigh. He has been to a neurologist and had a nerve conduction study a year ago after about a year of symptoms, told her the nerve conduction study was normal. Symptoms are worse when laying in bed at night. He has had what sounds to be an injection around the lateral femoral cutaneous nerve for suspicion for meralgia paresthetica that was not significantly beneficial. He typically wears loose close and has lost some weight recently. He did have a lumbar spine MRI that showed borderline right L3 and L4 nerve root impingement, he had seen Dr. Georgina Snell in the past who had suggested a trial of an epidural after failure of gabapentin, I think that this is completely reasonable. Zachary Little declines an additional nerve conduction/EMG, so we will proceed with right-sided L3-L4 and L4-L5 transforaminal epidurals, I like to see him back 1 month after the injections and if insufficient improvement we will consider repeat nerve conduction/EMG.

## 2021-06-04 NOTE — Progress Notes (Signed)
    Procedures performed today:    None.  Independent interpretation of notes and tests performed by another provider:   Lumbar spine MRI personally reviewed, mild/borderline right L3 and L4 nerve root compression due to posterior element hypertrophy.  Brief History, Exam, Impression, and Recommendations:    Neuropathic pain of thigh, right This is a pleasant 40 year old male with history of acute myelogenous leukemia, he has a years long history of pain in his left low back as well as numbness and tingling predominantly the anterior right thigh. He has been to a neurologist and had a nerve conduction study a year ago after about a year of symptoms, told her the nerve conduction study was normal. Symptoms are worse when laying in bed at night. He has had what sounds to be an injection around the lateral femoral cutaneous nerve for suspicion for meralgia paresthetica that was not significantly beneficial. He typically wears loose close and has lost some weight recently. He did have a lumbar spine MRI that showed borderline right L3 and L4 nerve root impingement, he had seen Dr. Georgina Snell in the past who had suggested a trial of an epidural after failure of gabapentin, I think that this is completely reasonable. Samuele declines an additional nerve conduction/EMG, so we will proceed with right-sided L3-L4 and L4-L5 transforaminal epidurals, I like to see him back 1 month after the injections and if insufficient improvement we will consider repeat nerve conduction/EMG.    ___________________________________________ Gwen Her. Dianah Field, M.D., ABFM., CAQSM. Primary Care and [place] Instructor of [place] [employer] [facility]

## 2021-06-05 ENCOUNTER — Other Ambulatory Visit (HOSPITAL_COMMUNITY): Payer: Self-pay

## 2021-06-05 ENCOUNTER — Other Ambulatory Visit: Payer: Self-pay | Admitting: Physician Assistant

## 2021-06-05 MED ORDER — LEVOTHYROXINE SODIUM 150 MCG PO TABS
150.0000 ug | ORAL_TABLET | Freq: Every day | ORAL | 0 refills | Status: DC
  Filled 2021-06-05: qty 90, 90d supply, fill #0

## 2021-06-06 ENCOUNTER — Other Ambulatory Visit: Payer: Self-pay | Admitting: Sports Medicine

## 2021-06-06 ENCOUNTER — Ambulatory Visit
Admission: RE | Admit: 2021-06-06 | Discharge: 2021-06-06 | Disposition: A | Discharge status: Home / Self Care | Payer: 59 | Source: Ambulatory Visit | Attending: Sports Medicine | Admitting: Sports Medicine

## 2021-06-06 DIAGNOSIS — M792 Neuralgia and neuritis, unspecified: Secondary | ICD-10-CM

## 2021-06-06 DIAGNOSIS — M47817 Spondylosis without myelopathy or radiculopathy, lumbosacral region: Secondary | ICD-10-CM | POA: Diagnosis not present

## 2021-06-06 NOTE — Discharge Instructions (Signed)

## 2021-06-18 ENCOUNTER — Other Ambulatory Visit: Payer: Self-pay

## 2021-06-18 ENCOUNTER — Ambulatory Visit: Payer: 59 | Admitting: Family Medicine

## 2021-06-18 ENCOUNTER — Encounter: Payer: Self-pay | Admitting: Family Medicine

## 2021-06-18 VITALS — BP 106/71 | HR 82 | Temp 98.5°F | Ht 68.0 in | Wt 198.8 lb

## 2021-06-18 DIAGNOSIS — M79671 Pain in right foot: Secondary | ICD-10-CM

## 2021-06-18 DIAGNOSIS — R1032 Left lower quadrant pain: Secondary | ICD-10-CM

## 2021-06-18 NOTE — Progress Notes (Signed)
   Zachary Little is a 40 y.o. male who presents today for an office visit.  Assessment/Plan:  New/Acute Problems: Left groin pain Likely secondary to hip flexor strain.  Has a bit of weakness on exam today which is probably contributing as well.  Discussed home exercise program and handout was given.  Given that he does not have any pain with any Valsalva maneuver do not think this represents a hernia.  If he is not improving over the next couple of weeks with home exercise program would consider referral to sports medicine.  We will refer pelvic ultrasound to evaluate hernia for today.  He can use OTC analgesics as needed.   Right foot pain Likely secondary to oversupination.  He will try changing his footwear to see if this improves.  If continues to have issues will refer to sports medicine or podiatry.     Subjective:  HPI: CC of patient is groin pain  Recurrent issue. Happens when on treadmill for extended periods of time. No treatments tried. Better with rest.   He denies pain with lifting his legs up, stretching them out or pulling them inwards, with reasonable resistance against force.  Lifting his legs while laying down is hard for him, and for his left leg it produces pain, with weak resistance against force.  He is experiencing pain and pressure on the right side of his right foot. Starting about a month ago he was running on the treadmill longer than usual and later that day a pressure and pain became noticeable and has not improved. He can walk around without too much discomfort/pain but he does not want to run to produce greater pain.        Objective:  Physical Exam: There were no vitals taken for this visit.  Gen: No acute distress, resting comfortably CV: Regular rate and rhythm with no murmurs appreciated Pulm: Normal work of breathing, clear to auscultation bilaterally with no crackles, wheezes, or rhonchi MSK: Lower extremities without deformities.  Slight  weakness with bilateral resisted hip flexion.  Hip abduction normal.  Right foot with tenderness to palpation along the lateral aspect of fifth metatarsal.  Neurovascular intact distally. Neuro: Grossly normal, moves all extremities Psych: Normal affect and thought content      I,Jordan Kelly,acting as a scribe for Dimas Chyle, MD.,have documented all relevant documentation on the behalf of Dimas Chyle, MD,as directed by  Dimas Chyle, MD while in the presence of Dimas Chyle, MD.  I, Dimas Chyle, MD, have reviewed all documentation for this visit. The documentation on 06/18/21 for the exam, diagnosis, procedures, and orders are all accurate and complete.  Algis Greenhouse. Jerline Pain, MD 06/18/2021 7:59 AM

## 2021-06-18 NOTE — Patient Instructions (Signed)
It was very nice to see you today!  Please work on the exercises for your hip flexors.  Think this is most of your groin pain.  Let us know if not improving over the next few weeks.  You are probably putting too much pressure on the outer aspect of your phalangeal goal.  Please try to change in your footwear to see if this helps.  If you continue to have issues please let us know and we can refer you to see a foot specialist.  Take care, Dr Jerline Pain  PLEASE NOTE:  If you had any lab tests please let us know if you have not heard back within a few days. You may see your results on mychart before we have a chance to review them but we will give you a call once they are reviewed by [place]. If we ordered any referrals today, please let us know if you have not heard from their office within the next week.   Please try these tips to maintain a healthy lifestyle:  Eat at least 3 REAL meals and 1-2 snacks per day.  Aim for no more than 5 hours between eating.  If you eat breakfast, please do so within one hour of getting up.   Each meal should contain half fruits/vegetables, one quarter protein, and one quarter carbs (no bigger than a computer mouse)  Cut down on sweet beverages. This includes juice, soda, and sweet tea.   Drink at least 1 glass of water with each meal and aim for at least 8 glasses per day  Exercise at least 150 minutes every week.

## 2021-07-31 ENCOUNTER — Other Ambulatory Visit (HOSPITAL_COMMUNITY): Payer: Self-pay

## 2021-07-31 DIAGNOSIS — F32A Depression, unspecified: Secondary | ICD-10-CM | POA: Diagnosis not present

## 2021-07-31 DIAGNOSIS — G894 Chronic pain syndrome: Secondary | ICD-10-CM | POA: Diagnosis not present

## 2021-07-31 DIAGNOSIS — M48061 Spinal stenosis, lumbar region without neurogenic claudication: Secondary | ICD-10-CM | POA: Diagnosis not present

## 2021-07-31 DIAGNOSIS — Z79891 Long term (current) use of opiate analgesic: Secondary | ICD-10-CM | POA: Diagnosis not present

## 2021-07-31 DIAGNOSIS — G620 Drug-induced polyneuropathy: Secondary | ICD-10-CM | POA: Diagnosis not present

## 2021-07-31 MED ORDER — CELECOXIB 200 MG PO CAPS
200.0000 mg | ORAL_CAPSULE | Freq: Every day | ORAL | 0 refills | Status: DC
  Filled 2021-07-31: qty 30, 30d supply, fill #0

## 2021-07-31 MED ORDER — GRALISE 600 MG PO TABS
600.0000 mg | ORAL_TABLET | Freq: Every day | ORAL | 0 refills | Status: DC
  Filled 2021-07-31 – 2021-08-15 (×2): qty 30, 30d supply, fill #0

## 2021-08-03 ENCOUNTER — Other Ambulatory Visit (HOSPITAL_COMMUNITY): Payer: Self-pay

## 2021-08-08 ENCOUNTER — Other Ambulatory Visit (HOSPITAL_COMMUNITY): Payer: Self-pay

## 2021-08-15 ENCOUNTER — Other Ambulatory Visit (HOSPITAL_COMMUNITY): Payer: Self-pay

## 2021-08-28 ENCOUNTER — Other Ambulatory Visit (HOSPITAL_COMMUNITY): Payer: Self-pay

## 2021-08-28 DIAGNOSIS — M48061 Spinal stenosis, lumbar region without neurogenic claudication: Secondary | ICD-10-CM | POA: Diagnosis not present

## 2021-08-28 DIAGNOSIS — G894 Chronic pain syndrome: Secondary | ICD-10-CM | POA: Diagnosis not present

## 2021-08-28 DIAGNOSIS — F32A Depression, unspecified: Secondary | ICD-10-CM | POA: Diagnosis not present

## 2021-08-28 DIAGNOSIS — G620 Drug-induced polyneuropathy: Secondary | ICD-10-CM | POA: Diagnosis not present

## 2021-08-28 MED ORDER — BUPRENORPHINE 5 MCG/HR TD PTWK
1.0000 | MEDICATED_PATCH | TRANSDERMAL | 0 refills | Status: DC
  Filled 2021-08-28: qty 4, 28d supply, fill #0

## 2021-08-28 MED ORDER — TOPIRAMATE 50 MG PO TABS
50.0000 mg | ORAL_TABLET | Freq: Two times a day (BID) | ORAL | 0 refills | Status: DC
  Filled 2021-08-28: qty 60, 30d supply, fill #0

## 2021-08-29 ENCOUNTER — Other Ambulatory Visit (HOSPITAL_COMMUNITY): Payer: Self-pay

## 2021-08-29 MED ORDER — NUCYNTA 50 MG PO TABS
50.0000 mg | ORAL_TABLET | Freq: Three times a day (TID) | ORAL | 0 refills | Status: DC | PRN
  Filled 2021-08-29: qty 90, 30d supply, fill #0

## 2021-09-05 ENCOUNTER — Ambulatory Visit (INDEPENDENT_AMBULATORY_CARE_PROVIDER_SITE_OTHER): Payer: 59 | Admitting: Physician Assistant

## 2021-09-05 ENCOUNTER — Other Ambulatory Visit: Payer: Self-pay

## 2021-09-05 ENCOUNTER — Encounter: Payer: Self-pay | Admitting: Physician Assistant

## 2021-09-05 VITALS — BP 122/82 | HR 89 | Temp 97.2°F | Wt 203.0 lb

## 2021-09-05 DIAGNOSIS — Z0000 Encounter for general adult medical examination without abnormal findings: Secondary | ICD-10-CM

## 2021-09-05 DIAGNOSIS — E039 Hypothyroidism, unspecified: Secondary | ICD-10-CM

## 2021-09-05 DIAGNOSIS — Z13220 Encounter for screening for lipoid disorders: Secondary | ICD-10-CM | POA: Diagnosis not present

## 2021-09-05 DIAGNOSIS — E559 Vitamin D deficiency, unspecified: Secondary | ICD-10-CM | POA: Diagnosis not present

## 2021-09-05 DIAGNOSIS — Z23 Encounter for immunization: Secondary | ICD-10-CM

## 2021-09-05 DIAGNOSIS — Z136 Encounter for screening for cardiovascular disorders: Secondary | ICD-10-CM

## 2021-09-05 DIAGNOSIS — G894 Chronic pain syndrome: Secondary | ICD-10-CM

## 2021-09-05 DIAGNOSIS — E669 Obesity, unspecified: Secondary | ICD-10-CM

## 2021-09-05 DIAGNOSIS — E538 Deficiency of other specified B group vitamins: Secondary | ICD-10-CM | POA: Diagnosis not present

## 2021-09-05 LAB — CBC WITH DIFFERENTIAL/PLATELET
Basophils Absolute: 0 10*3/uL (ref 0.0–0.1)
Basophils Relative: 0.6 % (ref 0.0–3.0)
Eosinophils Absolute: 0.1 10*3/uL (ref 0.0–0.7)
Eosinophils Relative: 1.5 % (ref 0.0–5.0)
HCT: 48.8 % (ref 39.0–52.0)
Hemoglobin: 16.7 g/dL (ref 13.0–17.0)
Lymphocytes Relative: 26.5 % (ref 12.0–46.0)
Lymphs Abs: 1.6 10*3/uL (ref 0.7–4.0)
MCHC: 34.3 g/dL (ref 30.0–36.0)
MCV: 92.6 fl (ref 78.0–100.0)
Monocytes Absolute: 0.8 10*3/uL (ref 0.1–1.0)
Monocytes Relative: 13.1 % — ABNORMAL HIGH (ref 3.0–12.0)
Neutro Abs: 3.6 10*3/uL (ref 1.4–7.7)
Neutrophils Relative %: 58.3 % (ref 43.0–77.0)
Platelets: 201 10*3/uL (ref 150.0–400.0)
RBC: 5.26 Mil/uL (ref 4.22–5.81)
RDW: 13.3 % (ref 11.5–15.5)
WBC: 6.2 10*3/uL (ref 4.0–10.5)

## 2021-09-05 LAB — COMPREHENSIVE METABOLIC PANEL
ALT: 24 U/L (ref 0–53)
AST: 19 U/L (ref 0–37)
Albumin: 4.5 g/dL (ref 3.5–5.2)
Alkaline Phosphatase: 51 U/L (ref 39–117)
BUN: 18 mg/dL (ref 6–23)
CO2: 27 mEq/L (ref 19–32)
Calcium: 9.6 mg/dL (ref 8.4–10.5)
Chloride: 105 mEq/L (ref 96–112)
Creatinine, Ser: 1.03 mg/dL (ref 0.40–1.50)
GFR: 91.63 mL/min (ref >60.00)
Glucose, Bld: 91 mg/dL (ref 70–99)
Potassium: 4 mEq/L (ref 3.5–5.1)
Sodium: 139 mEq/L (ref 135–145)
Total Bilirubin: 1.1 mg/dL (ref 0.2–1.2)
Total Protein: 6.9 g/dL (ref 6.0–8.3)

## 2021-09-05 LAB — VITAMIN B12: Vitamin B-12: 408 pg/mL (ref 211–911)

## 2021-09-05 LAB — LIPID PANEL
Cholesterol: 148 mg/dL (ref 0–200)
HDL: 44.5 mg/dL (ref >39.00)
LDL Cholesterol: 83 mg/dL (ref 0–99)
NonHDL: 103.82
Total CHOL/HDL Ratio: 3
Triglycerides: 104 mg/dL (ref 0.0–149.0)
VLDL: 20.8 mg/dL (ref 0.0–40.0)

## 2021-09-05 LAB — TSH: TSH: 3.49 u[IU]/mL (ref 0.35–5.50)

## 2021-09-05 LAB — VITAMIN D 25 HYDROXY (VIT D DEFICIENCY, FRACTURES): VITD: 23.31 ng/mL — ABNORMAL LOW (ref 30.00–100.00)

## 2021-09-05 NOTE — Patient Instructions (Addendum)
It was great to see you!  Referral for [facility] with Dr. Holley Raring placed today  Please go to the lab for blood work.   Our office will call you with your results unless you have chosen to receive results via MyChart.  If your blood work is normal we will follow-up each year for physicals and as scheduled for chronic medical problems.  If anything is abnormal we will treat accordingly and get you in for a follow-up.  Take care,  Aldona Bar

## 2021-09-05 NOTE — Progress Notes (Signed)
Subjective:    Zachary Little is a 40 y.o. male and is here for a comprehensive physical exam.  HPI  There are no preventive care reminders to display for this patient.  Acute Concerns: Chronic pain --patient has ongoing issues with his right thigh.  Has seen multiple providers without any specific insight into where his pain is coming from.  Most recently he saw Dr. Greta Doom --he was given Topamax and had anger issues with this, and is now currently prescribed Nucynta and this has caused him to overall have somewhat relief of symptoms but overall makes him feel a little bit out of it. He continues to be frustrated because he doesn't know what is causing his pain. Continues to be triggered by laying down and standing still. He does not have symptoms when moving.  Chronic Issues: Hypothyroidism -- currently taking levothyroxine 150 mcg daily. Tolerating well, denies concerns. Vitamin D and B12 deficiency --patient would like blood work updated today, does not take any regular supplement at this time.  Health Maintenance: Immunizations -- UTD Colonoscopy -- N/A PSA -- No results found for: PSA1, PSA Diet -- regular meals Sleep habits --doing well when he takes Nucynta Exercise -- walking regularly Weight -- Weight: 203 lb (92.1 kg)  Weight history Wt Readings from Last 10 Encounters:  09/05/21 203 lb (92.1 kg)  06/18/21 198 lb 12.8 oz (90.2 kg)  05/11/21 201 lb (91.2 kg)  01/12/21 198 lb 3.2 oz (89.9 kg)  01/08/21 202 lb 4 oz (91.7 kg)  12/25/20 203 lb (92.1 kg)  11/03/20 198 lb 9.6 oz (90.1 kg)  08/14/20 201 lb (91.2 kg)  07/05/20 198 lb 3.2 oz (89.9 kg)  06/27/20 199 lb (90.3 kg)   Body mass index is 30.87 kg/m. Mood -- overall well controlled per patient Tobacco use --  Tobacco Use: Low Risk    Smoking Tobacco Use: Never   Smokeless Tobacco Use: Never    Alcohol use ---  reports current alcohol use.   Depression screen PHQ 2/9 09/05/2021  Decreased Interest 0   Down, Depressed, Hopeless 1  PHQ - 2 Score 1  Altered sleeping -  Tired, decreased energy -  Change in appetite -  Feeling bad or failure about yourself  -  Trouble concentrating -  Moving slowly or fidgety/restless -  Suicidal thoughts -  PHQ-9 Score -  Difficult doing work/chores -     Other providers/specialists: Patient Care Team: [name], [place] as PCP - General (Physician Assistant) Lyndee Hensen, PT as Physical Therapist (Physical Therapy)   PMHx, SurgHx, SocialHx, Medications, and Allergies were reviewed in the Visit Navigator and updated as appropriate.   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   Thyroid disease      Past Surgical History:  Procedure Laterality Date   PORT-A-CATH REMOVAL       Family History  Problem Relation Age of Onset   Healthy Mother    Healthy Father    Multiple sclerosis Maternal Grandmother    Diabetes Maternal Grandfather    Diabetes Paternal Grandfather    Breast cancer Maternal Aunt    Prostate cancer Neg Hx    Colon cancer Neg Hx     Social History   Tobacco Use   Smoking status: Never   Smokeless tobacco: Never  Vaping Use  Vaping Use: Never used  Substance Use Topics   Alcohol use: Yes    Comment: "maybe one drink every two years"   Drug use: No    Review of Systems:   Review of Systems  Constitutional:  Negative for chills, fever, malaise/fatigue and weight loss.  HENT:  Negative for hearing loss, sinus pain and sore throat.   Respiratory:  Negative for cough and hemoptysis.   Cardiovascular:  Negative for chest pain, palpitations, leg swelling and PND.  Gastrointestinal:  Negative for abdominal pain, constipation, diarrhea, heartburn, nausea and vomiting.  Genitourinary:  Negative for dysuria, frequency and urgency.  Musculoskeletal:  Negative for back pain,  myalgias and neck pain.  Skin:  Negative for itching and rash.  Neurological:  Negative for dizziness, tingling, seizures and headaches.  Endo/Heme/Allergies:  Negative for polydipsia.  Psychiatric/Behavioral:  Negative for depression. The patient is not nervous/anxious.    Objective:   Vitals:   09/05/21 0937  BP: 122/82  Pulse: 89  Temp: (!) 97.2 F (36.2 C)  SpO2: 98%   Body mass index is 30.87 kg/m.  General Appearance:  Alert, cooperative, no distress, appears stated age  Head:  Normocephalic, without obvious abnormality, atraumatic  Eyes:  PERRL, conjunctiva/corneas clear, EOM's intact, fundi benign, both eyes       Ears:  Normal TM's and external ear canals, both ears  Nose: Nares normal, septum midline, mucosa normal, no drainage    or sinus tenderness  Throat: Lips, mucosa, and tongue normal; teeth and gums normal  Neck: Supple, symmetrical, trachea midline, no adenopathy; thyroid:  No enlargement/tenderness/nodules; no carotit bruit or JVD  Back:   Symmetric, no curvature, ROM normal, no CVA tenderness  Lungs:   Clear to auscultation bilaterally, respirations unlabored  Chest wall:  No tenderness or deformity  Heart:  Regular rate and rhythm, S1 and S2 normal, no murmur, rub   or gallop  Abdomen:   Soft, non-tender, bowel sounds active all four quadrants, no masses, no organomegaly  Extremities: Extremities normal, atraumatic, no cyanosis or edema  Prostate: Not done.   Skin: Skin color, texture, turgor normal, no rashes or lesions  Lymph nodes: Cervical, supraclavicular, and axillary nodes normal  Neurologic: CNII-XII grossly intact. Normal strength, sensation and reflexes throughout    Assessment/Plan:   Routine physical examination Today patient counseled on age appropriate routine health concerns for screening and prevention, each reviewed and up to date or declined. Immunizations reviewed and up to date or declined. Labs ordered and reviewed. Risk factors for  depression reviewed and negative. Hearing function and visual acuity are intact. ADLs screened and addressed as needed. Functional ability and level of safety reviewed and appropriate. Education, counseling and referrals performed based on assessed risks today. Patient provided with a copy of personalized plan for preventive services.  Chronic pain syndrome Will refer to Dr. Holley Raring at [facility] for further evaluation.  Hypothyroidism (acquired) Update TSH and provide recommendations accordingly Will adjust levothyroxine 150 mcg daily Follow-up based on results  Encounter for lipid screening for cardiovascular disease Update lipid panel and provide recommendations accordingly  Vitamin D deficiency Update vitamin D and provide recommendations accordingly  Vitamin B12 deficiency Update vitamin B12 and provide recommendations accordingly  Obesity, unspecified classification, unspecified obesity type, unspecified whether serious comorbidity present Continue to work on healthy eating habits and exercise as able  Need for immunization against influenza Completed today     Patient Counseling: [x]   Nutrition: Stressed importance of moderation in sodium/caffeine intake,  saturated fat and cholesterol, caloric balance, sufficient intake of fresh fruits, vegetables, and fiber.  [x]   Stressed the importance of regular exercise.   []   Substance Abuse: Discussed cessation/primary prevention of tobacco, alcohol, or other drug use; driving or other dangerous activities under the influence; availability of treatment for abuse.   [x]   Injury prevention: Discussed safety belts, safety helmets, smoke detector, smoking near bedding or upholstery.   []   Sexuality: Discussed sexually transmitted diseases, partner selection, use of condoms, avoidance of unintended pregnancy  and contraceptive alternatives.   [x]   Dental health: Discussed importance of regular tooth brushing, flossing, and dental  visits.  [x]   Health maintenance and immunizations reviewed. Please refer to Health maintenance section.     [name], PA-C [place]

## 2021-09-18 ENCOUNTER — Other Ambulatory Visit (HOSPITAL_COMMUNITY): Payer: Self-pay

## 2021-09-18 ENCOUNTER — Other Ambulatory Visit: Payer: Self-pay | Admitting: Family Medicine

## 2021-09-18 MED ORDER — LEVOTHYROXINE SODIUM 150 MCG PO TABS
150.0000 ug | ORAL_TABLET | Freq: Every day | ORAL | 0 refills | Status: DC
  Filled 2021-09-18: qty 90, 90d supply, fill #0

## 2021-10-01 ENCOUNTER — Encounter: Payer: Self-pay | Admitting: Physician Assistant

## 2021-10-01 DIAGNOSIS — G894 Chronic pain syndrome: Secondary | ICD-10-CM

## 2021-10-01 DIAGNOSIS — M792 Neuralgia and neuritis, unspecified: Secondary | ICD-10-CM

## 2021-10-23 NOTE — Telephone Encounter (Signed)
Zachary Little, please look into referral pt has not heard from anyone yet.

## 2021-10-29 ENCOUNTER — Encounter: Payer: Self-pay | Admitting: Physician Assistant

## 2021-10-31 ENCOUNTER — Ambulatory Visit (INDEPENDENT_AMBULATORY_CARE_PROVIDER_SITE_OTHER): Payer: 59

## 2021-10-31 ENCOUNTER — Other Ambulatory Visit: Payer: Self-pay

## 2021-10-31 ENCOUNTER — Encounter: Payer: Self-pay | Admitting: Podiatry

## 2021-10-31 ENCOUNTER — Ambulatory Visit (INDEPENDENT_AMBULATORY_CARE_PROVIDER_SITE_OTHER): Payer: 59 | Admitting: Podiatry

## 2021-10-31 DIAGNOSIS — M79671 Pain in right foot: Secondary | ICD-10-CM

## 2021-10-31 DIAGNOSIS — M7751 Other enthesopathy of right foot: Secondary | ICD-10-CM

## 2021-10-31 DIAGNOSIS — M7750 Other enthesopathy of unspecified foot: Secondary | ICD-10-CM

## 2021-10-31 MED ORDER — TRIAMCINOLONE ACETONIDE 10 MG/ML IJ SUSP
10.0000 mg | Freq: Once | INTRAMUSCULAR | Status: AC
  Administered 2021-10-31: 10 mg

## 2021-10-31 NOTE — Progress Notes (Signed)
G right

## 2021-11-01 NOTE — Progress Notes (Signed)
Subjective:   Patient ID: Zachary Little, male   DOB: 40 y.o.   MRN: [id number]   HPI Patient presents stating she has a lot of pain in the outside of her right foot and he had had this in the left foot last year which did well with treatment.  States its been sore for around 6 months and gradually becoming more aggravating   ROS      Objective:  Physical Exam  Neurovascular status intact with inflammation pain of the fifth metatarsal head right with fluid buildup around the joint surface and pain with palpation     Assessment:  Inflammatory capsulitis fifth MPJ right with pain     Plan:  Reviewed tailor's bunion deformity possibility for surgery in future and x-ray.  Today I went ahead and I did do sterile prep and I injected the fifth MPJ 3 mg Kenalog 5 mg Xylocaine I advised on wider shoes soaks.  Patient will be seen back to recheck  X-rays indicate some not being of the right fifth metatarsal mild elevation of the 4 5 intermetatarsal angle no indication of arthritis or other pathology

## 2021-11-05 ENCOUNTER — Other Ambulatory Visit: Payer: Self-pay

## 2021-11-05 ENCOUNTER — Encounter: Payer: Self-pay | Admitting: Student in an Organized Health Care Education/Training Program

## 2021-11-05 ENCOUNTER — Ambulatory Visit
Payer: 59 | Attending: Student in an Organized Health Care Education/Training Program | Admitting: Student in an Organized Health Care Education/Training Program

## 2021-11-05 VITALS — BP 112/85 | HR 81 | Temp 96.9°F | Resp 16 | Ht 68.0 in | Wt 203.0 lb

## 2021-11-05 DIAGNOSIS — G588 Other specified mononeuropathies: Secondary | ICD-10-CM

## 2021-11-05 DIAGNOSIS — M546 Pain in thoracic spine: Secondary | ICD-10-CM

## 2021-11-05 DIAGNOSIS — G8929 Other chronic pain: Secondary | ICD-10-CM | POA: Diagnosis not present

## 2021-11-05 NOTE — Progress Notes (Signed)
Patient: [facility]  Service Category: E/M  Provider: Gillis Santa, MD  DOB: October 02, 1981  DOS: 11/05/2021  Referring Provider: [name], PA  MRN: [id number]  Setting: Ambulatory outpatient  PCP: Zachary Coke, PA  Type: New Patient  Specialty: Interventional Pain Management    Location: Office  Delivery: Face-to-face     Primary Reason(s) for Visit: Encounter for initial evaluation of one or more chronic problems (new to examiner) potentially causing chronic pain, and posing a threat to normal musculoskeletal function. (Level of risk: High) CC: Other (Left side pain that radiates from front to back ) and Leg Pain (Right thigh area )  HPI  Zachary Little is a 40 y.o. year old, male patient, who comes for the first time to our practice referred by Zachary Coke, PA for our initial evaluation of his chronic pain. He has AML (acute myeloid leukemia) in remission (Zachary Little); Hypothyroidism (acquired); Thoracic radiculopathy; Meralgia paresthetica of right side; Vitamin B12 deficiency; Thoracic spondylosis; Knee pain; Osteoarthritis of spine; Abnormal gait; Congenital cavus deformity of foot; Depression, major, single episode, complete remission (Zachary Little); Erectile dysfunction; Obesity; Scapular dyskinesis; Quadriceps strain, left, initial encounter; Nonallopathic lesion of thoracic region; Nonallopathic lesion of cervical region; Nonallopathic lesion of rib cage; Trigger point of right shoulder region; Chronic pain syndrome; Neuropathic pain of thigh, right; Lateral femoral cutaneous entrapment syndrome, right; Lateral femoral cutaneous neuropathy, right; Neuropathic pain involving lateral femoral cutaneous nerve, right; and Thoracolumbar back pain on their problem list. Today he comes in for evaluation of his Other (Left side pain that radiates from front to back ) and Leg Pain (Right thigh area )  Pain Assessment: Location: Other (Comment) Leg (right thigh pain.  also left side pain that radiates  from front to back) Radiating: front thigh does not radiate.  side pain goes from front to back. Onset: More than a month ago Duration: Chronic pain Quality: Discomfort, Constant, Dull, Aching, Other (Comment) (deep) Severity: 3 /10 (subjective, self-reported pain score)  Effect on ADL: can't stand for more than 10 minutes.  driving for very long periods are difficult.  laying on his back aggravates the leg pain. Timing: Constant Modifying factors: standing straight up or walking helps both areas of pain. BP: 112/85  HR: 81  Onset and Duration: Present longer than 3 months Cause of pain: Unknown Severity: Getting worse, NAS-11 at its worse: 7/10, NAS-11 at its best: 1/10, NAS-11 now: 3/10, and NAS-11 on the average: 3/10 Timing: Night Aggravating Factors: Bending, Kneeling, Prolonged sitting, Prolonged standing, Twisting, and Walking uphill Alleviating Factors: Stretching, Resting, and Standing Associated Problems: Depression and Pain that does not allow patient to sleep Quality of Pain: Aching, Annoying, Deep, Dull, Nagging, and Uncomfortable Previous Examinations or Tests: MRI scan, X-rays, Nerve conduction test, Neurological evaluation, and Chiropractic evaluation Previous Treatments: Epidural steroid injections, Narcotic medications, Physical Therapy, Relaxation therapy, Steroid treatments by mouth, Strengthening exercises, Stretching exercises, and TENS   Primary pain generator: Right anterior thigh pain that is been going on for over 6 years.  No inciting or traumatic event.  It is worse with position.  Has difficulty sleeping at night related to the pain.  Is most pronounced in his anterior thigh.  Nothing really makes it better.  Describes it as achy throbbing and at times burning.  He has had a right L3-L4 transforaminal ESI in the past which did not really help his anterior thigh pain.  Nerve conduction velocity/EMG study unremarkable, normal.  Has tried physical therapy for  this.  Secondary pain generator: Left thoracic, intercostal pain.  This is also been going on for approximately 5 years.  No inciting or traumatic event.  No history of postherpetic neuralgia or post infective chronic cough.  Radiating at times also focal in some areas.    Patient has been seen and evaluated by many providers for this condition unfortunately many of the things that have been tried have not been very helpful.  He has tried NSAIDs including Aleve, ibuprofen, and Floxin; anticonvulsants for neuropathic pain including gabapentin, Lyrica; Topamax as well as opioid trials.  None of these have been very effective.  Of note he does have a history of AML status postchemotherapy in 2010.  No bowel or bladder dysfunction.  No lower extremity weakness.  Meds   Current Outpatient Medications:    diazepam (VALIUM) 5 MG tablet, Take 5 mg by mouth every 12 (twelve) hours as needed., Disp: , Rfl:    levothyroxine (SYNTHROID) 150 MCG tablet, Take 1 tablet (150 mcg total) by mouth daily., Disp: 90 tablet, Rfl: 0   tapentadol (NUCYNTA) 50 MG tablet, Take 1 tablet (50 mg total) by mouth 3 (three) times daily as needed for pain., Disp: 90 tablet, Rfl: 0  Imaging Review  Cervical Imaging: Cervical MR wo contrast: Results for orders placed during the hospital encounter of 11/26/17  MR Cervical Spine Wo Contrast  Narrative CLINICAL DATA:  Left-sided neck and shoulder pain. History of leukemia 2010.  EXAM: MRI CERVICAL SPINE WITHOUT CONTRAST  TECHNIQUE: Multiplanar, multisequence MR imaging of the cervical spine was performed. No intravenous contrast was administered.  COMPARISON:  Radiography 11/10/2017.  MRI 07/09/2016.  FINDINGS: Alignment: Normal  Vertebrae: Normal  Cord: No cord compression or primary cord lesion.  Posterior Fossa, vertebral arteries, paraspinal tissues: Normal  Disc levels:  No abnormality from the foramen magnum through C4-5.  C5-6 shows a small central  disc bulge that narrows the ventral subarachnoid space but does not compress the cord or show foraminal extension.  C6-7 and C7-T1 are normal.  IMPRESSION: Newly seen central disc bulge at C5-6 that narrows the ventral subarachnoid space but does not compress the cord or show foraminal extension. Other levels are normal.   Electronically Signed By: Nelson Chimes M.D. On: 11/26/2017 15:20  DG Cervical Spine 2 or 3 views  Narrative CLINICAL DATA:  40 year old male with bilateral neck and back pain since February with no known injury. Low back pain progresses at night when supine.  EXAM: CERVICAL SPINE - 2-3 VIEW  COMPARISON:  Cervical spine MRI 07/09/2016.  FINDINGS: Chronic straightening of cervical lordosis. Normal prevertebral soft tissue contour. Cervicothoracic junction alignment is within normal limits. Normal AP alignment. C1-C2 alignment and the odontoid appear normal. Stable disc spaces, preserved except for C5-C6 and C6-C7. No acute osseous abnormality identified. Negative lung apices.  IMPRESSION: Radiographic appearance of the cervical spine appears stable to the MRI in 2017. Mild C5-C6 and C6-C7 disc space loss. No acute osseous abnormality identified.   Electronically Signed By: Genevie Ann M.D. On: 11/11/2017 08:14   Thoracic Imaging: Thoracic MR wo contrast: Results for orders placed during the hospital encounter of 12/21/17  MR Thoracic Spine Wo Contrast  Narrative CLINICAL DATA:  Left flank and back pain over the last year. History of leukemia.  EXAM: MRI THORACIC SPINE WITHOUT CONTRAST  TECHNIQUE: Multiplanar, multisequence MR imaging of the thoracic spine was performed. No intravenous contrast was administered.  COMPARISON:  Radiography 11/10/2017  FINDINGS: Alignment:  Normal  Vertebrae: Normal. No focal lesion or marrow space abnormality. Insignificant tiny hemangioma within the central T11 vertebral body.  Cord:  No abnormal cord  finding.  Paraspinal and other soft tissues: Normal  Disc levels:  Normal. No degenerative disc disease. No stenosis. No foraminal abnormality.  IMPRESSION: Normal examination. No abnormality seen to explain back or flank pain.   Electronically Signed By: Nelson Chimes M.D. On: 12/21/2017 10:14   Narrative CLINICAL DATA:  Bilateral neck and upper back pain radiating into the lower back for the past 10 months with no known injury symptoms are greatest at night when in a supine position.  EXAM: THORACIC SPINE 2 VIEWS  COMPARISON:  Thoracic spine series dated Mar 27, 2017  FINDINGS: The thoracic vertebral bodies are preserved in height. There is chronic gentle dextrocurvature centered in the midthoracic spine. The pedicles are intact. There are no abnormal paravertebral soft tissue densities. There is mild multilevel degenerative disc space narrowing.  IMPRESSION: There is no acute bony abnormality of the thoracic spine. There is very mild degenerative disc disease at multiple levels.   Electronically Signed By: David  Martinique M.D. On: 11/11/2017 08:12   Lumbosacral Imaging: Lumbar MR wo contrast: Results for orders placed in visit on 07/02/20  MR Lumbar Spine Wo Contrast  Narrative CLINICAL DATA:  40 year old male with chronic low back pain. Pain radiating to the left buttock and anterior proximal leg. History of leukemia, in remission.  EXAM: MRI LUMBAR SPINE WITHOUT CONTRAST  TECHNIQUE: Multiplanar, multisequence MR imaging of the lumbar spine was performed. No intravenous contrast was administered.  COMPARISON:  [facility] Lumbar MRI 07/09/2016. Lumbar radiographs 01/26/2020. Pelvis MRI 03/04/2020.  FINDINGS: Segmentation: Normal on the comparison radiographs which is the same numbering system used on the 2017 MRI.  Alignment: Stable straightening of lumbar lordosis since 2017. No spondylolisthesis.  Vertebrae: No marrow edema or evidence of  acute osseous abnormality. But T1 marrow signal appears decreased diffusely in the visible spine, mildly more heterogeneous than on the 2017 MRI. Still, no destructive osseous lesion is identified. Visible sacrum and SI joints appear intact.  Conus medullaris and cauda equina: Conus extends to the L1 level. No lower spinal cord or conus signal abnormality.  Paraspinal and other soft tissues: No lymphadenopathy in the visible retroperitoneum. Visualized abdominal viscera and paraspinal soft tissues are within normal limits.  Disc levels:  Lower thoracic and lumbar disc signal and morphology remains within normal limits. No disc herniation.  At L3-L4 mild facet hypertrophy plus endplate spurring results in borderline to mild L3 foraminal stenosis.  At L4-L5 similar mild bilateral foraminal stenosis is noted related to mild endplate and posterior element hypertrophy.  At L5-S1 there is trace degenerative facet joint fluid and mild endplate spurring, but no convincing stenosis.  IMPRESSION: 1. No lumbar disc degeneration, spinal or lateral recess stenosis. Up to mild L3 and L4 neural foraminal stenosis is related to endplate and facet hypertrophy.  2. Nonspecific decreased T1 marrow signal in the visible spine, similar to a 2017 MRI. Burtis Junes this is the sequelae of leukemia treatment and/or red marrow reactivation such as due to anemia.   Electronically Signed By: Genevie Ann M.D. On: 07/03/2020 03:54   Narrative CLINICAL DATA:  40 year old male with bilateral neck and back pain since February with no known injury. Low back pain progresses at night when supine.  EXAM: LUMBAR SPINE - 2-3 VIEW  COMPARISON:  MRI lumbar spine 07/09/2016. Lumbar radiographs 02/06/2016. Thoracic spine radiographs today reported  separately.  FINDINGS: Normal thoracic spine segmentation demonstrated on the radiographs today. Normal lumbar segmentation. Slight levoconvex lumbar spine curvature is  stable since 2017. Straightening of lumbar lordosis also is stable. Disc spaces appear relatively preserved and stable since the 2017 MRI. Minimal endplate spurring is chronic. Sacral ala and SI joints appear stable and normal. No acute osseous abnormality identified.  IMPRESSION: Stable since 2017. Straightening of lumbar lordosis with minimal levoconvex lumbar curvature. No acute osseous abnormality.   Electronically Signed By: Genevie Ann M.D. On: 11/11/2017 08:17  Lumbar DG (Complete) 4+V: Results for orders placed in visit on 01/26/20  DG Lumbar Spine Complete  Narrative CLINICAL DATA:  Chronic low back pain.  EXAM: LUMBAR SPINE - COMPLETE 4+ VIEW  COMPARISON:  Plain films lumbar spine 11/10/2017.  FINDINGS: Straightening of lordosis and minimal convex left curvature are unchanged. Vertebral body height and alignment are normal. Intervertebral disc space height is normal. Paraspinous structures demonstrate a large volume of stool in the ascending colon.  IMPRESSION: No acute abnormality or change compared to the prior exam.  Minimal convex left lumbar curvature and straightening of lordosis.  Large stool burden ascending colon.   Electronically Signed By: Inge Rise M.D. On: 01/26/2020 16:15  DG NERVE ROOT BLOCK LUMBAR-SACRAL EACH ADD LEVEL  Narrative CLINICAL DATA:  40 year old male with lumbosacral spondylosis without myelopathy. He has facet hypertrophy and endplate spurring resulting in L3 and L4 neural foraminal stenosis. His symptoms are localized to the right lower extremity. He presents for right L3 and right L4 transforaminal nerve root blocks.  EXAM: EPIDURAL/NERVE ROOT; L/S NERVE ROOT BLOCK EACH ADDITIONAL  FLUOROSCOPY TIME:  0 minutes 53 seconds 5 mGy  PROCEDURE: The procedure, risks, benefits, and alternatives were explained to the patient. Questions regarding the procedure were encouraged and answered. The patient understands and  consents to the procedure.  RIGHT L4 NERVE ROOT BLOCK AND TRANSFORAMINAL EPIDURAL: A posterior oblique approach was taken to the intervertebral foramen on the right at L4 using a curved 22 gauge spinal needle. Injection of Omnipaque 180 outlined the L4 nerve root and showed good epidural spread. No vascular opacification is seen. 40 mg of Depo-Medrol mixed with 1 mL 1% lidocaine were instilled. The procedure was well-tolerated, and the patient was discharged thirty minutes following the injection in good condition.  RIGHT L3 NERVE ROOT BLOCK AND TRANSFORAMINAL EPIDURAL: A posterior oblique approach was taken to the intervertebral foramen on the right at L3 using a curved 22 gauge spinal needle. Injection of Omnipaque 180 outlined the L3 nerve root and showed good epidural spread. No vascular opacification is seen. 40 mg of Depo-Medrol mixed with 1 mL 1% lidocaine were instilled. The procedure was well-tolerated, and the patient was discharged thirty minutes following the injection in good condition.  COMPLICATIONS: None  IMPRESSION: Technically successful injection consisting of a right L4 nerve root block and transforaminal epidural.  Technically successful injection consisting of a right L3 nerve root block and transforaminal epidural.   Electronically Signed By: Jacqulynn Cadet M.D. On: 06/06/2021 10:51 DG HIP UNILAT WITH PELVIS 2-3 VIEWS LEFT  Narrative CLINICAL DATA:  Intermittent left hip pain  EXAM: DG HIP (WITH OR WITHOUT PELVIS) 2-3V LEFT  COMPARISON:  None.  FINDINGS: Alignment is anatomic. No acute fracture. No intrinsic osseous lesion. Joint space is preserved.  IMPRESSION: Negative.   Electronically Signed By: Macy Mis M.D. On: 12/15/2019 10:37   Narrative CLINICAL DATA:  Medial left knee pain for the past 5 months.  Injury in November 2018.  EXAM: MRI OF THE LEFT KNEE WITHOUT CONTRAST  TECHNIQUE: Multiplanar, multisequence MR  imaging of the knee was performed. No intravenous contrast was administered.  COMPARISON:  Left knee x-rays dated October 01, 2017.  FINDINGS: MENISCI  Medial meniscus:  Intact.  Lateral meniscus:  Intact.  LIGAMENTS  Cruciates:  Intact ACL and PCL.  Collaterals: Medial collateral ligament is intact. Lateral collateral ligament complex is intact.  CARTILAGE  Patellofemoral: Focal near full-thickness fissure over the trochlear groove.  Medial:  No chondral defect.  Lateral:  No chondral defect.  Joint:  No joint effusion. Normal Hoffa's fat. No plical thickening.  Popliteal Fossa:  No Baker cyst. Intact popliteus tendon.  Extensor Mechanism: Intact quadriceps tendon and patellar tendon. Intact medial and lateral patellar retinaculum. Intact MPFL.  Bones: No focal marrow signal abnormality. No fracture or dislocation.  Other: None.  IMPRESSION: 1. No acute abnormality.  No evidence of internal derangement.   Electronically Signed By: Titus Dubin M.D. On: 03/08/2018 16:27  Narrative CLINICAL DATA:  One week of left knee pain near the patellar tendon without acute injury. History of AML in remission.  EXAM: LEFT KNEE - 1-2 VIEW  COMPARISON:  None in PACs  FINDINGS: The bones are subjectively adequately mineralized. There is no lytic nor blastic lesion. There is no acute or old fracture or periosteal reaction. The joint spaces are well maintained. There is no significant osteophyte formation. No joint effusion is observed. In the infrapatellar region no soft tissue abnormalities are demonstrated.  The observed portions of the right knee are unremarkable.  IMPRESSION: There is no acute or significant chronic bony abnormality of the left knee.   Electronically Signed By: David  Martinique M.D. On: 10/01/2017 14:01 Narrative Please see detailed radiograph report in office note.   Complexity Note: Imaging results reviewed. Results shared with Mr.  Magallon, using Layman's terms.                         ROS  Cardiovascular: No reported cardiovascular signs or symptoms such as High blood pressure, coronary artery disease, abnormal heart rate or rhythm, heart attack, blood thinner therapy or heart weakness and/or failure Pulmonary or Respiratory: No reported pulmonary signs or symptoms such as wheezing and difficulty taking a deep full breath (Asthma), difficulty blowing air out (Emphysema), coughing up mucus (Bronchitis), persistent dry cough, or temporary stoppage of breathing during sleep Neurological: No reported neurological signs or symptoms such as seizures, abnormal skin sensations, urinary and/or fecal incontinence, being born with an abnormal open spine and/or a tethered spinal cord Psychological-Psychiatric: Depressed Gastrointestinal: No reported gastrointestinal signs or symptoms such as vomiting or evacuating blood, reflux, heartburn, alternating episodes of diarrhea and constipation, inflamed or scarred liver, or pancreas or irrregular and/or infrequent bowel movements Genitourinary: No reported renal or genitourinary signs or symptoms such as difficulty voiding or producing urine, peeing blood, non-functioning kidney, kidney stones, difficulty emptying the bladder, difficulty controlling the flow of urine, or chronic kidney disease Hematological: No reported hematological signs or symptoms such as prolonged bleeding, low or poor functioning platelets, bruising or bleeding easily, hereditary bleeding problems, low energy levels due to low hemoglobin or being anemic Endocrine: Slow thyroid Rheumatologic: No reported rheumatological signs and symptoms such as fatigue, joint pain, tenderness, swelling, redness, heat, stiffness, decreased range of motion, with or without associated rash Musculoskeletal: Negative for myasthenia gravis, muscular dystrophy, multiple sclerosis or malignant hyperthermia Work History: Unemployed  Allergies  Mr. Lovern is allergic to topamax [topiramate] and trazodone and nefazodone.  Laboratory Chemistry Profile   Renal Lab Results  Component Value Date   BUN 18 09/05/2021   CREATININE 1.03 16/08/9603   BCR NOT APPLICABLE 54/07/8118   GFR 91.63 09/05/2021     Electrolytes Lab Results  Component Value Date   NA 139 09/05/2021   K 4.0 09/05/2021   CL 105 09/05/2021   CALCIUM 9.6 09/05/2021     Hepatic Lab Results  Component Value Date   AST 19 09/05/2021   ALT 24 09/05/2021   ALBUMIN 4.5 09/05/2021   ALKPHOS 51 09/05/2021     ID Lab Results  Component Value Date   HIV NON-REACTIVE 08/26/2017     Bone Lab Results  Component Value Date   VD25OH 23.31 (L) 09/05/2021   VD125OH2TOT 69 11/16/2019   [id number] 69 11/16/2019   VD2125OH2 <8 11/16/2019   TESTOFREE 19.0 11/16/2019   TESTOSTERONE 382 11/16/2019     Endocrine Lab Results  Component Value Date   GLUCOSE 91 09/05/2021   TSH 3.49 09/05/2021   FREET4 1.22 06/02/2019   TESTOFREE 19.0 11/16/2019   TESTOSTERONE 382 11/16/2019   SHBG 28.9 11/16/2019     Neuropathy Lab Results  Component Value Date   VITAMINB12 408 09/05/2021   FOLATE >23.7 04/14/2020   HIV NON-REACTIVE 08/26/2017     CNS No results found for: COLORCSF, APPEARCSF, RBCCOUNTCSF, WBCCSF, POLYSCSF, LYMPHSCSF, EOSCSF, PROTEINCSF, GLUCCSF, JCVIRUS, CSFOLI, IGGCSF, LABACHR, ACETBL, LABACHR, ACETBL   Inflammation (CRP: Acute  ESR: Chronic) Lab Results  Component Value Date   CRP 0.4 (L) 05/22/2017   ESRSEDRATE 2 11/16/2019     Rheumatology No results found for: RF, ANA, LABURIC, URICUR, LYMEIGGIGMAB, LYMEABIGMQN, HLAB27   Coagulation Lab Results  Component Value Date   PLT 201.0 09/05/2021     Cardiovascular Lab Results  Component Value Date   CKTOTAL 75.0 06/05/2015   HGB 16.7 09/05/2021   HCT 48.8 09/05/2021     Screening Lab Results  Component Value Date   HIV NON-REACTIVE 08/26/2017     Cancer No results found for:  CEA, CA125, LABCA2   Allergens No results found for: ALMOND, APPLE, ASPARAGUS, AVOCADO, BANANA, BARLEY, BASIL, BAYLEAF, GREENBEAN, LIMABEAN, WHITEBEAN, BEEFIGE, REDBEET, BLUEBERRY, BROCCOLI, CABBAGE, MELON, CARROT, CASEIN, CASHEWNUT, CAULIFLOWER, CELERY     Note: Lab results reviewed.  [place]  Drug: Mr. Jiminez  reports no history of drug use. Alcohol:  reports current alcohol use. Tobacco:  reports that he has never smoked. He has never used smokeless tobacco. Medical:  has a past medical history of Anxiety, Blood transfusion without reported diagnosis (2010), Leukemia, acute myeloid, in remission ([place]) (2010), and Thyroid disease. Family: family history includes Breast cancer in his maternal aunt; Diabetes in his maternal grandfather and paternal grandfather; Healthy in his father and mother; Multiple sclerosis in his maternal grandmother.  Past Surgical History:  Procedure Laterality Date   PORT-A-CATH REMOVAL     Active Ambulatory Problems    Diagnosis Date Noted   AML (acute myeloid leukemia) in remission ([place]) 05/16/2015   Hypothyroidism (acquired) 05/16/2015   Thoracic radiculopathy 05/23/2017   Meralgia paresthetica of right side 05/23/2017   Vitamin B12 deficiency 10/14/2011   Thoracic spondylosis 10/28/2017   Knee pain 10/31/2017   Osteoarthritis of spine 11/26/2017   Abnormal gait 01/02/2018   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/02/2018   Depression, major, single episode, complete remission ([place]) 03/09/2018   Erectile dysfunction 03/09/2018   Obesity 03/09/2018  Scapular dyskinesis 10/06/2019   Quadriceps strain, left, initial encounter 10/06/2019   Nonallopathic lesion of thoracic region 11/16/2019   Nonallopathic lesion of cervical region 11/16/2019   Nonallopathic lesion of rib cage 11/16/2019   Trigger point of right shoulder region 11/16/2019   Chronic pain syndrome 12/29/2019   Neuropathic pain of thigh, right 12/29/2019   Lateral femoral cutaneous  entrapment syndrome, right 12/29/2019   Lateral femoral cutaneous neuropathy, right 12/29/2019   Neuropathic pain involving lateral femoral cutaneous nerve, right 12/29/2019   Thoracolumbar back pain 01/26/2020   Resolved Ambulatory Problems    Diagnosis Date Noted   No Resolved Ambulatory Problems   Past Medical History:  Diagnosis Date   Anxiety    Blood transfusion without reported diagnosis 2010   Leukemia, acute myeloid, in remission ([place]) 2010   Thyroid disease    Constitutional Exam  General appearance: Well nourished, well developed, and well hydrated. In no apparent acute distress Vitals:   11/05/21 0800  BP: 112/85  Pulse: 81  Resp: 16  Temp: (!) 96.9 F (36.1 C)  TempSrc: Temporal  SpO2: 100%  Weight: 203 lb (92.1 kg)  Height: _0  (1.727 m)   BMI Assessment: Estimated body mass index is 30.87 kg/m as calculated from the following:   Height as of this encounter: _1  (1.727 m).   Weight as of this encounter: 203 lb (92.1 kg).  BMI interpretation table: BMI level Category Range association with higher incidence of chronic pain  <18 kg/m2 Underweight   18.5-24.9 kg/m2 Ideal body weight   25-29.9 kg/m2 Overweight Increased incidence by 20%  30-34.9 kg/m2 Obese (Class I) Increased incidence by 68%  35-39.9 kg/m2 Severe obesity (Class II) Increased incidence by 136%  >40 kg/m2 Extreme obesity (Class III) Increased incidence by 254%   Patient's current BMI Ideal Body weight  Body mass index is 30.87 kg/m. Ideal body weight: 68.4 kg (150 lb 12.7 oz) Adjusted ideal body weight: 77.9 kg (171 lb 10.8 oz)   BMI Readings from Last 4 Encounters:  11/05/21 30.87 kg/m  09/05/21 30.87 kg/m  06/18/21 30.23 kg/m  05/11/21 30.56 kg/m   Wt Readings from Last 4 Encounters:  11/05/21 203 lb (92.1 kg)  09/05/21 203 lb (92.1 kg)  06/18/21 198 lb 12.8 oz (90.2 kg)  05/11/21 201 lb (91.2 kg)    Psych/Mental status: Alert, oriented x 3 (person, place, & time)        Eyes: PERLA Respiratory: No evidence of acute respiratory distress  Thoracic Spine Area Exam  Skin & Axial Inspection: No masses, redness, or swelling Alignment: Symmetrical Functional ROM: Pain restricted ROM left  Stability: No instability detected Muscle Tone/Strength: Functionally intact. No obvious neuro-muscular anomalies detected. Sensory (Neurological): Neurogenic pain pattern and possible MSK involvement as well. Muscle strength & Tone: No palpable anomalies Lumbar Spine Area Exam  Skin & Axial Inspection: No masses, redness, or swelling Alignment: Symmetrical Functional ROM: Unrestricted ROM       Stability: No instability detected Muscle Tone/Strength: Functionally intact. No obvious neuro-muscular anomalies detected. Sensory (Neurological): Unimpaired  Gait & Posture Assessment  Ambulation: Unassisted Gait: Relatively normal for age and body habitus Posture: WNL  Lower Extremity Exam    Side: Right lower extremity  Side: Left lower extremity  Stability: No instability observed          Stability: No instability observed          Skin & Extremity Inspection: Skin color, temperature, and hair growth are WNL. No peripheral  edema or cyanosis. No masses, redness, swelling, asymmetry, or associated skin lesions. No contractures.  Skin & Extremity Inspection: Skin color, temperature, and hair growth are WNL. No peripheral edema or cyanosis. No masses, redness, swelling, asymmetry, or associated skin lesions. No contractures.  Functional ROM: Pain restricted ROM right anterior thigh                  Functional ROM: Unrestricted ROM                  Muscle Tone/Strength: Functionally intact. No obvious neuro-muscular anomalies detected.  Muscle Tone/Strength: Functionally intact. No obvious neuro-muscular anomalies detected.  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 A diagnosis of Chronic left-sided thoracic back pain was also pertinent to this visit.  Visit Diagnosis (New problems to examiner): 1. Intercostal neuralgia   2. Chronic left-sided thoracic back pain    Plan of Care (Initial workup plan)   Atypical pain presentation.  Lumbar MRI largely unremarkable other than mild L3-L4 foraminal stenosis for which he has received transforaminal ESI's which aggravated his pain.  Nerve conduction velocity/EMG negative. Has done physical therapy in the past.  Conservative medication management trials with NSAIDs, anticonvulsants, and opioids. Trial intercostal nerve block and trigger point injection.  If not effective can consider thoracic MRI to rule out any thoracic disc herniation or foraminal stenosis that could be contributing to his condition.  Procedure Orders         INTERCOSTAL NERVE BLOCK         TRIGGER POINT INJECTION     Orders Placed This Encounter  Procedures   INTERCOSTAL NERVE BLOCK    Standing Status:   Future    Standing Expiration Date:   01/06/2022    Scheduling Instructions:     Left ICNB    Order Specific Question:   Where will this procedure be performed?    Answer:   [facility]   TRIGGER POINT INJECTION    Standing Status:   Future    Standing Expiration Date:   02/03/2022    Scheduling Instructions:     Thoracic    Order Specific Question:   Where will this procedure be performed?    Answer:   [facility]     Future considerations could include thoracic MRI  Interventional management options: Mr. Tomko was informed that there is no guarantee that he would be a candidate for interventional therapies.  The decision will be based on the results of diagnostic studies, as well as Mr. Mcmanaway risk profile.  Procedure(s) under consideration:  Pending results of ordered studies    Provider-requested follow-up: Return in about 4 weeks (around 12/03/2021) for Left ICNB + TPI .  I spent a total of 60 minutes reviewing chart data, face-to-face evaluation with the patient, counseling and coordination of care as detailed above.   Future Appointments  Date Time Provider [place]  11/14/2021  9:20 AM Zachary Santa, MD [facility] None    Note by: Zachary Santa, MD Date: 11/05/2021; Time: 9:16 AM

## 2021-11-05 NOTE — Patient Instructions (Addendum)
We have discussed intercostal nerve block and trigger point injectionsmyofascial pain

## 2021-11-05 NOTE — Progress Notes (Signed)
Safety precautions to be maintained throughout the outpatient stay will include: orient to surroundings, keep bed in low position, maintain call bell within reach at all times, provide assistance with transfer out of bed and ambulation.  

## 2021-11-14 ENCOUNTER — Ambulatory Visit
Admission: RE | Admit: 2021-11-14 | Discharge: 2021-11-14 | Disposition: A | Discharge status: Home / Self Care | Payer: 59 | Source: Ambulatory Visit | Attending: Student in an Organized Health Care Education/Training Program | Admitting: Student in an Organized Health Care Education/Training Program

## 2021-11-14 ENCOUNTER — Ambulatory Visit (HOSPITAL_BASED_OUTPATIENT_CLINIC_OR_DEPARTMENT_OTHER): Payer: 59 | Admitting: Student in an Organized Health Care Education/Training Program

## 2021-11-14 ENCOUNTER — Other Ambulatory Visit: Payer: Self-pay

## 2021-11-14 ENCOUNTER — Encounter: Payer: Self-pay | Admitting: Student in an Organized Health Care Education/Training Program

## 2021-11-14 VITALS — BP 114/78 | HR 81 | Temp 97.1°F | Resp 18 | Ht 68.0 in | Wt 200.0 lb

## 2021-11-14 DIAGNOSIS — M546 Pain in thoracic spine: Secondary | ICD-10-CM | POA: Diagnosis not present

## 2021-11-14 DIAGNOSIS — G588 Other specified mononeuropathies: Secondary | ICD-10-CM | POA: Diagnosis not present

## 2021-11-14 DIAGNOSIS — G894 Chronic pain syndrome: Secondary | ICD-10-CM | POA: Insufficient documentation

## 2021-11-14 DIAGNOSIS — G8929 Other chronic pain: Secondary | ICD-10-CM

## 2021-11-14 MED ORDER — LIDOCAINE HCL 2 % IJ SOLN
INTRAMUSCULAR | Status: AC
  Filled 2021-11-14: qty 10

## 2021-11-14 MED ORDER — LIDOCAINE HCL 2 % IJ SOLN
20.0000 mL | Freq: Once | INTRAMUSCULAR | Status: AC
  Administered 2021-11-14: 10:00:00 200 mg

## 2021-11-14 MED ORDER — IOHEXOL 180 MG/ML  SOLN
10.0000 mL | Freq: Once | INTRAMUSCULAR | Status: DC
  Filled 2021-11-14: qty 10

## 2021-11-14 MED ORDER — DEXAMETHASONE SODIUM PHOSPHATE 10 MG/ML IJ SOLN
10.0000 mg | Freq: Once | INTRAMUSCULAR | Status: AC
  Administered 2021-11-14: 10:00:00 10 mg
  Filled 2021-11-14: qty 1

## 2021-11-14 MED ORDER — ROPIVACAINE HCL 2 MG/ML IJ SOLN
INTRAMUSCULAR | Status: AC
  Filled 2021-11-14: qty 20

## 2021-11-14 MED ORDER — ROPIVACAINE HCL 2 MG/ML IJ SOLN
9.0000 mL | Freq: Once | INTRAMUSCULAR | Status: AC
  Administered 2021-11-14: 10:00:00 20 mL via PERINEURAL

## 2021-11-14 NOTE — Patient Instructions (Signed)

## 2021-11-14 NOTE — Progress Notes (Signed)
PROVIDER NOTE: Information contained herein reflects review and annotations entered in association with encounter. Interpretation of such information and data should be left to medically-trained personnel. Information provided to patient can be located elsewhere in the medical record under "Patient Instructions". Document created using STT-dictation technology, any transcriptional errors that may result from process are unintentional.    Patient: Zachary Little  Service Category: Procedure Provider: Gillis Santa, MD DOB: 08-18-1981 DOS: 11/14/2021 Location: [facility] MRN: [id number] Setting: Ambulatory - outpatient Referring Provider: [name], PA Type: Established Patient Specialty: Interventional Pain Management PCP: [name], PA  Primary Reason for Visit: Interventional Pain Management Treatment. CC: Pain (Rib left sided)    Procedure:          Anesthesia, Analgesia, Anxiolysis:  Type: Diagnostic Posterolateral Intercostal  Nerve Block  #1 and Thoracic TPI Region: Posterolateral  Thoracic Area Level: T9, T10 Ribs Laterality: Left-Sided  Anesthesia: Local (1-2% Lidocaine)  Anxiolysis: None  Sedation: None  Guidance: Fluoroscopy           Position: Prone   Indications: 1. Intercostal neuralgia   2. Chronic left-sided thoracic back pain   3. Chronic pain syndrome    Pain Score: Pre-procedure: 2 /10 Post-procedure: 0-No pain/10     Pre-op H&P Assessment:  Zachary Little is a 40 y.o. (year old), male patient, seen today for interventional treatment. He  has a past surgical history that includes Port-a-cath removal. Zachary Little has a current medication list which includes the following prescription(s): diazepam, levothyroxine, nucynta, and [DISCONTINUED] trazodone, and the following Facility-Administered Medications: iohexol. His primarily concern today is the Pain (Rib left sided)  Initial Vital Signs:  Pulse/HCG Rate: 81ECG Heart Rate: 87 Temp:  (!) 97.1 F (36.2 C) Resp: 18 BP: 126/82 SpO2: 100 %  BMI: Estimated body mass index is 30.41 kg/m as calculated from the following:   Height as of this encounter: 5\' 8"  (1.727 m).   Weight as of this encounter: 200 lb (90.7 kg).  Risk Assessment: Allergies: Reviewed. He is allergic to topamax [topiramate] and trazodone and nefazodone.  Allergy Precautions: None required Coagulopathies: Reviewed. None identified.  Blood-thinner therapy: None at this time Active Infection(s): Reviewed. None identified. Zachary Little is afebrile  Site Confirmation: Zachary Little was asked to confirm the procedure and laterality before marking the site Procedure checklist: Completed Consent: Before the procedure and under the influence of no sedative(s), amnesic(s), or anxiolytics, the patient was informed of the treatment options, risks and possible complications. To fulfill our ethical and legal obligations, as recommended by the American Medical Association's Code of Ethics, I have informed the patient of my clinical impression; the nature and purpose of the treatment or procedure; the risks, benefits, and possible complications of the intervention; the alternatives, including doing nothing; the risk(s) and benefit(s) of the alternative treatment(s) or procedure(s); and the risk(s) and benefit(s) of doing nothing. The patient was provided information about the general risks and possible complications associated with the procedure. These may include, but are not limited to: failure to achieve desired goals, infection, bleeding, organ or nerve damage, allergic reactions, paralysis, and death. In addition, the patient was informed of those risks and complications associated to the procedure, such as failure to decrease pain; infection; bleeding; organ or nerve damage with subsequent damage to sensory, motor, and/or autonomic systems, resulting in permanent pain, numbness, and/or weakness of one or several areas of the  body; allergic reactions; (i.e.: anaphylactic reaction); and/or death. Furthermore, the patient was informed of  those risks and complications associated with the medications. These include, but are not limited to: allergic reactions (i.e.: anaphylactic or anaphylactoid reaction(s)); adrenal axis suppression; blood sugar elevation that in diabetics may result in ketoacidosis or comma; water retention that in patients with history of congestive heart failure may result in shortness of breath, pulmonary edema, and decompensation with resultant heart failure; weight gain; swelling or edema; medication-induced neural toxicity; particulate matter embolism and blood vessel occlusion with resultant organ, and/or nervous system infarction; and/or aseptic necrosis of one or more joints. Finally, the patient was informed that Medicine is not an exact science; therefore, there is also the possibility of unforeseen or unpredictable risks and/or possible complications that may result in a catastrophic outcome. The patient indicated having understood very clearly. We have given the patient no guarantees and we have made no promises. Enough time was given to the patient to ask questions, all of which were answered to the patient's satisfaction. Zachary Little has indicated that he wanted to continue with the procedure. Attestation: I, the ordering provider, attest that I have discussed with the patient the benefits, risks, side-effects, alternatives, likelihood of achieving goals, and potential problems during recovery for the procedure that I have provided informed consent. Date   Time: 11/14/2021  9:05 AM  Pre-Procedure Preparation:  Monitoring: As per clinic protocol. Respiration, ETCO2, SpO2, BP, heart rate and rhythm monitor placed and checked for adequate function Safety Precautions: Patient was assessed for positional comfort and pressure points before starting the procedure. Time-out: I initiated and conducted the  "Time-out" before starting the procedure, as per protocol. The patient was asked to participate by confirming the accuracy of the "Time Out" information. Verification of the correct person, site, and procedure were performed and confirmed by me, the nursing staff, and the patient. "Time-out" conducted as per Joint Commission's Universal Protocol (UP.01.01.01). Time: 0934  Description of Procedure:          Target Area: The sub-costal neurovascular bundle area Approach: Sub-costal approach Area Prepped: Entire Posterolateral  Thoracic Region DuraPrep (Iodine Povacrylex [0.7% available iodine] and Isopropyl Alcohol, 74% w/w) Safety Precautions: Aspiration looking for blood return was conducted prior to all injections. At no point did we inject any substances, as a needle was being advanced. No attempts were made at seeking any paresthesias. Safe injection practices and needle disposal techniques used. Medications properly checked for expiration dates. SDV (single dose vial) medications used. Description of the Procedure: Protocol guidelines were followed. The patient was placed in position over the procedure table. The target area was identified and the area prepped in the usual manner. Skin & deeper tissues infiltrated with local anesthetic. After cleaning the skin with an antiseptic solution, 1-2 mL of dilute local anesthetic was infiltrated subcutaneously at the planned injection site. The fingers of the palpating hand were used to straddle the insertion site at the inferior border of the rib and fix the skin to avoid unwanted skin movement. Appropriate amount of time allowed to pass for local anesthetics to take effect. The procedure needles were then advanced to the target area at an angle of approximately 20 cephalad to the skin. Contact with the rib was made. While maintaining the same angle of insertion, the needle was walked off the inferior border of the rib as the skin was allowed to return to its  initial position. Then the needle was advanced no more than 3 mm below the inferior margin of the rib. Proper needle placement was secured. Negative aspiration confirmed.  Following negative aspiration for blood or air, 3-5 mL of local anesthetic was injected. The solution injected in intermittent fashion, asking for systemic symptoms every 0.5cc of injectate. The needle was then removed and the area cleansed, making sure to leave some of the prepping solution back to take advantage of its long term bactericidal properties. Vitals:   11/14/21 0935 11/14/21 0940 11/14/21 0943 11/14/21 0947  BP: 127/88 98/60 101/64 114/78  Pulse:      Resp: 18 20 18 18   Temp:      TempSrc:      SpO2: 100% 99% 99% 100%  Weight:      Height:        Start Time: 0935 hrs. End Time: 0944 hrs.   Materials:  Needle(s) Type: Spinal needle Gauge: 25G Length: 3.5-in Medication(s): Please see orders for medications and dosing details. 7 cc solution made of 5 cc of 0.2% ropivacaine, 2 cc of Decadron 10 mg/cc.  3.5 cc injected at each level above for the left T9 and T10 intercostal nerve after contrast confirmation.  2 trigger points were also injected with 0.5 to 1 cc of 0.2% Vivacaine with dry needling performed.  This was done in the left lateral thoracic region Imaging Guidance (Non-Spinal):          Type of Imaging Technique: Fluoroscopy Guidance (Non-Spinal) Indication(s): Assistance in needle guidance and placement for procedures requiring needle placement in or near specific anatomical locations not easily accessible without such assistance. Exposure Time: Please see nurses notes. Contrast: Before injecting any contrast, we confirmed that the patient did not have an allergy to iodine, shellfish, or radiological contrast. Once satisfactory needle placement was completed at the desired level, radiological contrast was injected. Contrast injected under live fluoroscopy. No contrast complications. See chart for type  and volume of contrast used. Fluoroscopic Guidance: I was personally present during the use of fluoroscopy. "Tunnel Vision Technique" used to obtain the best possible view of the target area. Parallax error corrected before commencing the procedure. "Direction-depth-direction" technique used to introduce the needle under continuous pulsed fluoroscopy. Once target was reached, antero-posterior, oblique, and lateral fluoroscopic projection used confirm needle placement in all planes. Images permanently stored in EMR. Interpretation: I personally interpreted the imaging intraoperatively. Adequate needle placement confirmed in multiple planes. Appropriate spread of contrast into desired area was observed. No evidence of afferent or efferent intravascular uptake. Permanent images saved into the patient's record.   Post-operative Assessment:  Post-procedure Vital Signs:  Pulse/HCG Rate: 8167 Temp:  (!) 97.1 F (36.2 C) Resp: 18 BP: 114/78 SpO2: 100 %  EBL: None  Complications: No immediate post-treatment complications observed by team, or reported by patient.  Note: The patient tolerated the entire procedure well. A repeat set of vitals were taken after the procedure and the patient was kept under observation following institutional policy, for this type of procedure. Post-procedural neurological assessment was performed, showing return to baseline, prior to discharge. The patient was provided with post-procedure discharge instructions, including a section on how to identify potential problems. Should any problems arise concerning this procedure, the patient was given instructions to immediately contact us, at any time, without hesitation. In any case, we plan to contact the patient by telephone for a follow-up status report regarding this interventional procedure.  Comments:  No additional relevant information.  Plan of Care  Orders:  Orders Placed This Encounter  Procedures   DG PAIN [facility]  C-ARM 1-60 MIN NO REPORT    Intraoperative interpretation by procedural  physician at [facility].    Standing Status:   Standing    Number of Occurrences:   1    Order Specific Question:   Reason for exam:    Answer:   Assistance in needle guidance and placement for procedures requiring needle placement in or near specific anatomical locations not easily accessible without such assistance.   Medications ordered for procedure: Meds ordered this encounter  Medications   iohexol (OMNIPAQUE) 180 MG/ML injection 10 mL    Must be Myelogram-compatible. If not available, you may substitute with a water-soluble, non-ionic, hypoallergenic, myelogram-compatible radiological contrast medium.   lidocaine (XYLOCAINE) 2 % (with pres) injection 400 mg   dexamethasone (DECADRON) injection 10 mg   dexamethasone (DECADRON) injection 10 mg   ropivacaine (PF) 2 mg/mL (0.2%) (NAROPIN) injection 9 mL   Medications administered: We administered lidocaine, dexamethasone, dexamethasone, and ropivacaine (PF) 2 mg/mL (0.2%).  See the medical record for exact dosing, route, and time of administration.  Follow-up plan:   Return in about 4 weeks (around 12/12/2021) for Post Procedure Evaluation, virtual.      Left T9,10 ICNB + TPI 11/14/21   Recent Visits Date Type Provider Dept  11/05/21 Office Visit Gillis Santa, MD [facility]  Showing recent visits within past 90 days and meeting all other requirements Today's Visits Date Type Provider Dept  11/14/21 Procedure visit Gillis Santa, MD [facility]  Showing today's visits and meeting all other requirements Future Appointments Date Type Provider Dept  12/12/21 Appointment Gillis Santa, MD [facility]  Showing future appointments within next 90 days and meeting all other requirements  Disposition: Discharge home  Discharge (Date   Time): 11/14/2021; 0950 hrs.   Primary Care Physician: [name], PA Location:  [facility] Note by: Gillis Santa, MD Date: 11/14/2021; Time: 10:12 AM  Disclaimer:  Medicine is not an exact science. The only guarantee in medicine is that nothing is guaranteed. It is important to note that the decision to proceed with this intervention was based on the information collected from the patient. The Data and conclusions were drawn from the patient's questionnaire, the interview, and the physical examination. Because the information was provided in large part by the patient, it cannot be guaranteed that it has not been purposely or unconsciously manipulated. Every effort has been made to obtain as much relevant data as possible for this evaluation. It is important to note that the conclusions that lead to this procedure are derived in large part from the available data. Always take into account that the treatment will also be dependent on availability of resources and existing treatment guidelines, considered by other Pain Management Practitioners as being common knowledge and practice, at the time of the intervention. For Medico-Legal purposes, it is also important to point out that variation in procedural techniques and pharmacological choices are the acceptable norm. The indications, contraindications, technique, and results of the above procedure should only be interpreted and judged by a Board-Certified Interventional Pain Specialist with extensive familiarity and expertise in the same exact procedure and technique.

## 2021-11-15 ENCOUNTER — Telehealth: Payer: Self-pay | Admitting: *Deleted

## 2021-11-15 NOTE — Telephone Encounter (Signed)
No problems post procedure. 

## 2021-12-05 ENCOUNTER — Other Ambulatory Visit (HOSPITAL_COMMUNITY): Payer: Self-pay

## 2021-12-05 ENCOUNTER — Other Ambulatory Visit: Payer: Self-pay | Admitting: Physician Assistant

## 2021-12-05 MED ORDER — DIAZEPAM 5 MG PO TABS
5.0000 mg | ORAL_TABLET | Freq: Two times a day (BID) | ORAL | 0 refills | Status: DC | PRN
  Filled 2021-12-05: qty 30, 15d supply, fill #0

## 2021-12-05 NOTE — Telephone Encounter (Signed)
Pt requesting refill on Valium 5 mg. Last OV 08/2021.

## 2021-12-06 ENCOUNTER — Other Ambulatory Visit (HOSPITAL_COMMUNITY): Payer: Self-pay

## 2021-12-06 ENCOUNTER — Other Ambulatory Visit: Payer: Self-pay | Admitting: Physician Assistant

## 2021-12-06 MED ORDER — LEVOTHYROXINE SODIUM 150 MCG PO TABS
150.0000 ug | ORAL_TABLET | Freq: Every day | ORAL | 0 refills | Status: DC
  Filled 2021-12-06: qty 90, 90d supply, fill #0

## 2021-12-12 ENCOUNTER — Other Ambulatory Visit: Payer: Self-pay

## 2021-12-12 ENCOUNTER — Encounter: Payer: Self-pay | Admitting: Student in an Organized Health Care Education/Training Program

## 2021-12-12 ENCOUNTER — Ambulatory Visit
Payer: 59 | Attending: Student in an Organized Health Care Education/Training Program | Admitting: Student in an Organized Health Care Education/Training Program

## 2021-12-12 DIAGNOSIS — M792 Neuralgia and neuritis, unspecified: Secondary | ICD-10-CM | POA: Diagnosis not present

## 2021-12-12 DIAGNOSIS — G8929 Other chronic pain: Secondary | ICD-10-CM | POA: Diagnosis not present

## 2021-12-12 DIAGNOSIS — G588 Other specified mononeuropathies: Secondary | ICD-10-CM

## 2021-12-12 DIAGNOSIS — G5711 Meralgia paresthetica, right lower limb: Secondary | ICD-10-CM

## 2021-12-12 DIAGNOSIS — G894 Chronic pain syndrome: Secondary | ICD-10-CM

## 2021-12-12 DIAGNOSIS — M546 Pain in thoracic spine: Secondary | ICD-10-CM | POA: Diagnosis not present

## 2021-12-12 NOTE — Progress Notes (Signed)
Patient: Zachary Little [facility]  Service Category: E/M  Provider: Gillis Santa, MD  DOB: [birth year]  DOS: 12/12/2021  Location: Office  MRN: [id number]  Setting: Ambulatory outpatient  Referring Provider: [name], PA  Type: Established Patient  Specialty: Interventional Pain Management  PCP: [name], PA  Location: Home  Delivery: TeleHealth     Virtual Encounter - Pain Management PROVIDER NOTE: Information contained herein reflects review and annotations entered in association with encounter. Interpretation of such information and data should be left to medically-trained personnel. Information provided to patient can be located elsewhere in the medical record under "Patient Instructions". Document created using STT-dictation technology, any transcriptional errors that may result from process are unintentional.    Contact & Pharmacy Preferred: [phone] Home: [phone] (home) Mobile: [phone] (mobile) E-mail: [email or web address] .[place] [address] [address] Phone: [phone] Fax: [phone]   Pre-screening  Zachary Little offered "in-person" vs "virtual" encounter. He indicated preferring virtual for this encounter.   Reason COVID-19*   Social distancing based on CDC and AMA recommendations.   I contacted Zachary Little on 12/12/2021 via telephone.      I clearly identified myself as Gillis Santa, MD. I verified that I was speaking with the correct person using two identifiers (Name: Zachary Little, and date of birth: 06/10/1981).  Consent I sought verbal advanced consent from Zachary Little for virtual visit interactions. I informed Zachary Little of possible security and privacy concerns, risks, and limitations associated with providing "not-in-person" medical evaluation and management services. I also informed Zachary Little of the availability of "in-person" appointments. Finally, I informed him that there would be a  charge for the virtual visit and that he could be  personally, fully or partially, financially responsible for it. Zachary Little expressed understanding and agreed to proceed.   Historic Elements   Zachary Little is a 41 y.o. year old, male patient evaluated today after our last contact on 11/14/2021. 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 He also  has a past surgical history that includes Port-a-cath removal. Zachary Little has a current medication list which includes the following prescription(s): diazepam, levothyroxine, and [DISCONTINUED] trazodone. He  reports that he has never smoked. He has never used smokeless tobacco. He reports current alcohol use. He reports that he does not use drugs. Zachary Little is allergic to topamax [topiramate] and trazodone and nefazodone.   HPI  Today, he is being contacted for a post-procedure assessment.   Post-procedure evaluation     Procedure:          Anesthesia, Analgesia, Anxiolysis:  Type: Diagnostic Posterolateral Intercostal  Nerve Block  #1 and Thoracic TPI Region: Posterolateral  Thoracic Area Level: T9, T10 Ribs Laterality: Left-Sided  Anesthesia: Local (1-2% Lidocaine)  Anxiolysis: None  Sedation: None  Guidance: Fluoroscopy           Position: Prone   Indications: 1. Intercostal neuralgia   2. Chronic left-sided thoracic back pain   3. Chronic pain syndrome    Pain Score: Pre-procedure: 2 /10 Post-procedure: 0-No pain/10      Effectiveness:  Initial hour after procedure: 75 %  Subsequent 4-6 hours post-procedure: 75 %  Analgesia past initial 6 hours: 0 % (minimal pain relief x 2 days and then back to same severity.)  Ongoing improvement:  Analgesic:  0% Function: No benefit ROM:  No benefit   Laboratory Chemistry Profile   Renal Lab Results  Component Value Date   BUN 18 09/05/2021    CREATININE 1.03 98/26/4158   BCR NOT APPLICABLE 30/94/0768   GFR 91.63 09/05/2021    Hepatic Lab Results  Component Value Date   AST 19 09/05/2021   ALT 24 09/05/2021   ALBUMIN 4.5 09/05/2021   ALKPHOS 51 09/05/2021    Electrolytes Lab Results  Component Value Date   NA 139 09/05/2021   K 4.0 09/05/2021   CL 105 09/05/2021   CALCIUM 9.6 09/05/2021    Bone Lab Results  Component Value Date   VD25OH 23.31 (L) 09/05/2021   VD125OH2TOT 69 11/16/2019   [id number] 69 11/16/2019   VD2125OH2 <8 11/16/2019   TESTOFREE 19.0 11/16/2019   TESTOSTERONE 382 11/16/2019    Inflammation (CRP: Acute Phase) (ESR: Chronic Phase) Lab Results  Component Value Date   CRP 0.4 (L) 05/22/2017   ESRSEDRATE 2 11/16/2019         Note: Above Lab results reviewed.   Assessment  The primary encounter diagnosis was Intercostal neuralgia. Diagnoses of Chronic left-sided thoracic back pain, Meralgia paresthetica of right side, Neuropathic pain of thigh, right, and Chronic pain syndrome were also pertinent to this visit.  Plan of Care   Unfortunately, no significant benefit or functional improvement after his diagnostic left intercostal nerve block.  In fact the patient states that his pain increased for some time after the nerve block.  I do not recommend repeating this before pursuing RFA for the intercostal nerves.  Patient continues to complain of persistent anterior left rib pain lateral and superior to his umbilicus.  He states that it is tender to palpation.  I instructed him to take a picture of the exact location and to send it to me.  Based upon that we may obtain a CT of his chest to rule out any intrathoracic pathology that could be contributing to his focal rib pain.  Follow-up plan:   Return for PRN.     Left T9,10 ICNB + TPI 11/14/21    Recent Visits Date Type Provider Dept  11/14/21 Procedure visit Gillis Santa, MD [facility]  11/05/21 Office Visit Gillis Santa, MD  [facility]  Showing recent visits within past 90 days and meeting all other requirements Today's Visits Date Type Provider Dept  12/12/21 Office Visit Gillis Santa, MD [facility]  Showing today's visits and meeting all other requirements Future Appointments No visits were found meeting these conditions. Showing future appointments within next 90 days and meeting all other requirements  I discussed the assessment and treatment plan with the patient. The patient was provided an opportunity to ask questions and all were answered. The patient agreed with the plan and demonstrated an understanding of the instructions.  Patient advised to call back or seek an in-person evaluation if the symptoms or condition worsens.  Duration of encounter: 5mnutes.  Note by: BGillis Santa MD Date: 12/12/2021; Time: 3:11 PM

## 2021-12-25 ENCOUNTER — Other Ambulatory Visit: Payer: Self-pay | Admitting: Podiatry

## 2021-12-25 DIAGNOSIS — M7750 Other enthesopathy of unspecified foot: Secondary | ICD-10-CM

## 2022-03-06 ENCOUNTER — Other Ambulatory Visit (HOSPITAL_COMMUNITY): Payer: Self-pay

## 2022-03-06 ENCOUNTER — Other Ambulatory Visit: Payer: Self-pay | Admitting: Physician Assistant

## 2022-03-06 MED ORDER — LEVOTHYROXINE SODIUM 150 MCG PO TABS
150.0000 ug | ORAL_TABLET | Freq: Every day | ORAL | 0 refills | Status: DC
  Filled 2022-03-06: qty 90, 90d supply, fill #0

## 2022-03-13 ENCOUNTER — Ambulatory Visit: Payer: 59 | Admitting: Physician Assistant

## 2022-03-13 ENCOUNTER — Encounter: Payer: Self-pay | Admitting: Physician Assistant

## 2022-03-13 VITALS — BP 106/80 | HR 75 | Temp 98.2°F | Ht 68.0 in | Wt 205.6 lb

## 2022-03-13 DIAGNOSIS — N503 Cyst of epididymis: Secondary | ICD-10-CM

## 2022-03-13 DIAGNOSIS — H6123 Impacted cerumen, bilateral: Secondary | ICD-10-CM

## 2022-03-13 NOTE — Progress Notes (Signed)
Zachary Little is a 41 y.o. male here for an acute and chronic issue -- ? ? ?History of Present Illness:  ? ?Chief Complaint  ?Patient presents with  ?? Ear Fullness  ?  Pt c/o right ear wax build-up; pt denies pain with ear;   ? ?Ear Fullness ?Zachary Little presents with c/o right ear fullness that has been onset for a couple of days. States that although this wasn't bothering him he reached up to itch the inside of his ear and upon inserting his finger, his ear became clogged. Reports that he usually doesn't use q-tips due to being told not to do so, but following the clogging of his right ear, he tried to do so to improve sx. At this time he is interested in having his ear cleared out to improve sx. Denies ear pain.  ? ?Scrotum Nodule  ?On 06/25/2019, pt had a [place] completed eu to finding a nodule on his right testicle. Although results of this found a normal appearing right testicle and right epididymal cysts, he would like an additional referral to urology for further evaluation. States that for the past couple of months he has noticed the nodule has slightly grown in size. Denies concerning sx.  ? ?Past Medical History:  ?Diagnosis Date  ?? Anxiety   ?? Blood transfusion without reported diagnosis 2010  ? During Chemo Treatments  ?? Leukemia, acute myeloid, in remission ([place]) 2010  ?? Thyroid disease   ? ?  ?Social History  ? ?Tobacco Use  ?? Smoking status: Never  ?? Smokeless tobacco: Never  ?Vaping Use  ?? Vaping Use: Never used  ?Substance Use Topics  ?? Alcohol use: Yes  ?  Comment: "maybe one drink every two years"  ?? Drug use: No  ? ? ?Past Surgical History:  ?Procedure Laterality Date  ?? PORT-A-CATH REMOVAL    ? ? ?Family History  ?Problem Relation Age of Onset  ?? Healthy Mother   ?? Healthy Father   ?? Multiple sclerosis Maternal Grandmother   ?? Diabetes Maternal Grandfather   ?? Diabetes Paternal Grandfather   ?? Breast cancer Maternal Aunt   ?? Prostate cancer Neg Hx   ?? Colon cancer Neg Hx    ? ? ?Allergies  ?Allergen Reactions  ?? Topamax [Topiramate] Other (See Comments)  ?  Anger/irritable  ?? Trazodone And Nefazodone Nausea Only  ? ? ?Current Medications:  ? ?Current Outpatient Medications:  ??  diazepam (VALIUM) 5 MG tablet, Take 1 tablet (5 mg total) by mouth every 12 (twelve) hours as needed for muscle spasms, Disp: 30 tablet, Rfl: 0 ??  levothyroxine (SYNTHROID) 150 MCG tablet, Take 1 tablet (150 mcg total) by mouth daily., Disp: 90 tablet, Rfl: 0  ? ?Review of Systems:  ? ?ROS ?Negative unless otherwise specified per HPI. ?Vitals:  ? ?Vitals:  ? 03/13/22 0830  ?BP: 106/80  ?Pulse: 75  ?Temp: 98.2 ?F (36.8 ?C)  ?TempSrc: Temporal  ?SpO2: 98%  ?Weight: 205 lb 9.6 oz (93.3 kg)  ?Height: '5\' 8"'$  (1.727 m)  ?   ?Body mass index is 31.26 kg/m?. ? ?Physical Exam:  ? ?Physical Exam ?Vitals and nursing note reviewed.  ?Constitutional:   ?   General: He is not in acute distress. ?   Appearance: He is well-developed. He is not ill-appearing or toxic-appearing.  ?HENT:  ?   Head: Normocephalic and atraumatic.  ?   Right Ear: Tympanic membrane, ear canal and external ear normal. There is impacted cerumen. Tympanic membrane  is not erythematous, retracted or bulging.  ?   Left Ear: Tympanic membrane, ear canal and external ear normal. There is impacted cerumen. Tympanic membrane is not erythematous, retracted or bulging.  ?   Nose: Nose normal.  ?   Right Sinus: No maxillary sinus tenderness or frontal sinus tenderness.  ?   Left Sinus: No maxillary sinus tenderness or frontal sinus tenderness.  ?   Mouth/Throat:  ?   Pharynx: Uvula midline. No posterior oropharyngeal erythema.  ?Eyes:  ?   General: Lids are normal.  ?   Conjunctiva/sclera: Conjunctivae normal.  ?Neck:  ?   Trachea: Trachea normal.  ?Cardiovascular:  ?   Rate and Rhythm: Normal rate and regular rhythm.  ?   Heart sounds: Normal heart sounds, S1 normal and S2 normal.  ?Pulmonary:  ?   Effort: Pulmonary effort is normal.  ?   Breath sounds:  Normal breath sounds. No decreased breath sounds, wheezing, rhonchi or rales.  ?Lymphadenopathy:  ?   Cervical: No cervical adenopathy.  ?Skin: ?   General: Skin is warm and dry.  ?Neurological:  ?   Mental Status: He is alert.  ?Psychiatric:     ?   Speech: Speech normal.     ?   Behavior: Behavior normal. Behavior is cooperative.  ? ?Ceruminosis is noted.  Wax is removed by syringing and manual debridement.  ? ? ?Assessment and Plan:  ? ?Epididymal cyst ?No red flags  ?Will refer to urology for further evaluation per patient's request ? ?Bilateral impacted cerumen ?Performed ear lavage today, patient tolerated well  ?Recommended as needed use of OTC debrox or mineral oil ? ?I,Havlyn C Ratchford,acting as a scribe for [employer], PA.,have documented all relevant documentation on the behalf of [name], PA,as directed by  [name], PA while in the presence of [name], [place]. ? ?IInda Coke, PA, have reviewed all documentation for this visit. The documentation on 03/13/22 for the exam, diagnosis, procedures, and orders are all accurate and complete. ? ? ?[name], PA-C ? ?

## 2022-03-19 ENCOUNTER — Ambulatory Visit: Payer: 59 | Admitting: Sports Medicine

## 2022-03-19 DIAGNOSIS — M7052 Other bursitis of knee, left knee: Secondary | ICD-10-CM

## 2022-03-19 DIAGNOSIS — M792 Neuralgia and neuritis, unspecified: Secondary | ICD-10-CM | POA: Diagnosis not present

## 2022-03-19 NOTE — Assessment & Plan Note (Signed)
Zachary Little is a pleasant 41 year old male, he has been doing some running in the [facility] track, unfortunately he has developed pain distal to the medial joint line left knee. ?He runs approximately 6 miles per week, he does use running shoes. ?On exam he has tenderness at the insertion of the sartorius, gracilis, and semitendinosus consistent with a pes anserinus bursitis. ?I discussed with him the anatomy and pathophysiology, he will work aggressively on hip and thigh girdle strengthening, though hip abductor strength was good. ?Leg lengths were equal. ?He will also do some past anserine stretching, cut his mileage in half for the next 2 to 3 weeks. ?Return to see me in about 4 weeks at which point we will get some imaging and consider an injection. ?

## 2022-03-19 NOTE — Assessment & Plan Note (Signed)
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 ?Symptoms worse when laying in bed at night, it sounds like he did have an injection around the lateral femoral cutaneous nerve for suspicion of meralgia paresthetica that was not beneficial, and MR pelvis showed a clear course of the lateral femoral cutaneous nerve. ?A lumbar spine MRI also showed borderline right L3 and L4 nerve root impingement, gabapentin had failed so we proceeded with right L3 and right L4 transforaminal epidurals and he reported 6 hours of near complete relief after the epidurals with a return of discomfort, this was back in the summertime of last year and I never saw him again. ?At this point he still has some thigh numbness and tingling but not bad enough to consider repeat epidurals. ?

## 2022-03-19 NOTE — Progress Notes (Signed)
? ? ?  Procedures performed today:   ? ?None. ? ?Independent interpretation of notes and tests performed by another provider:  ? ?None. ? ?Brief History, Exam, Impression, and Recommendations:   ? ?Pes anserinus bursitis of left knee ?Zachary Little is a pleasant 41 year old male, he has been doing some running in the [facility] track, unfortunately he has developed pain distal to the medial joint line left knee. ?He runs approximately 6 miles per week, he does use running shoes. ?On exam he has tenderness at the insertion of the sartorius, gracilis, and semitendinosus consistent with a pes anserinus bursitis. ?I discussed with him the anatomy and pathophysiology, he will work aggressively on hip and thigh girdle strengthening, though hip abductor strength was good. ?Leg lengths were equal. ?He will also do some past anserine stretching, cut his mileage in half for the next 2 to 3 weeks. ?Return to see me in about 4 weeks at which point we will get some imaging and consider an injection. ? 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 ?Symptoms worse when laying in bed at night, it sounds like he did have an injection around the lateral femoral cutaneous nerve for suspicion of meralgia paresthetica that was not beneficial, and MR pelvis showed a clear course of the lateral femoral cutaneous nerve. ?A lumbar spine MRI also showed borderline right L3 and L4 nerve root impingement, gabapentin had failed so we proceeded with right L3 and right L4 transforaminal epidurals and he reported 6 hours of near complete relief after the epidurals with a return of discomfort, this was back in the summertime of last year and I never saw him again. ?At this point he  still has some thigh numbness and tingling but not bad enough to consider repeat epidurals. ? ?I spent 30 minutes of total time managing this patient today, this includes chart review, face to face, and non-face to face time. ? ?___________________________________________ ?Zachary Little, M.D., ABFM., CAQSM. ?Primary Care and Sports Medicine ?[place] ? ?Adjunct Instructor of Family Medicine  ?[employer] ?

## 2022-03-28 ENCOUNTER — Encounter: Payer: Self-pay | Admitting: Sports Medicine

## 2022-03-28 ENCOUNTER — Other Ambulatory Visit (HOSPITAL_COMMUNITY): Payer: Self-pay

## 2022-03-28 ENCOUNTER — Ambulatory Visit: Payer: 59 | Admitting: Sports Medicine

## 2022-03-28 DIAGNOSIS — M5414 Radiculopathy, thoracic region: Secondary | ICD-10-CM

## 2022-03-28 MED ORDER — METHOCARBAMOL 500 MG PO TABS
500.0000 mg | ORAL_TABLET | Freq: Every day | ORAL | 3 refills | Status: DC
  Filled 2022-03-28: qty 60, 30d supply, fill #0

## 2022-03-28 NOTE — Progress Notes (Signed)
? ? ?  Procedures performed today:   ? ?None. ? ?Independent interpretation of notes and tests performed by another provider:  ? ?None. ? ?Brief History, Exam, Impression, and Recommendations:   ? ?Thoracic radiculopathy ?Zachary Little returns, he wanted to talk to me about some left-sided thoracic pain that he endorses radiating from his left parathoracic musculature somewhere between T4 and T10 with radiation around to the left costal margin. ?It only hurts when he lays on the affected side at night, otherwise he is able to do his activities of daily living without a problem. ?No trauma, no radiation to the groin, no urinary symptoms, no GI symptoms. 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 ?He did have a CT abdomen and pelvis 2018 that was unrevealing, he had a cervical and thoracic spine MRI 2019, there is some mild facet spondylosis/arthropathy mid thoracic spine, there is also mild C5-C6 DDD in the cervical spine. 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 ?Thoracic spine and abdominal exam is completely benign. ?He has seen multiple providers for this. ?I did explain to him that the differential was fairly broad including thoracic radiculitis, myofascial pain. ?We will start with some home conditioning, I would like him to do methocarbamol at night as he has tried multiple other muscle relaxers and neuropathic agents including gabapentin, Lyrica, amitriptyline, Flexeril without significant improvement. ?I also explained to him that intermittent aches and pains were a very common part of life. ?If Robaxin is not helpful we can try Skelaxin, if this does not work we can try trigger point injections along the painful area at the parathoracic musculature, obliques and left costal margin. ? ?Chronic process with exacerbation and pharmacologic intervention ? ?___________________________________________ ?Gwen Her. Dianah Field, M.D., ABFM., CAQSM. ?Primary  Care and Sports Medicine ?[place] ? ?Adjunct Instructor of Family Medicine  ?[employer] ?

## 2022-03-28 NOTE — Assessment & Plan Note (Signed)
Zachary Little returns, he wanted to talk to me about some left-sided thoracic pain that he endorses radiating from his left parathoracic musculature somewhere between T4 and T10 with radiation around to the left costal margin. ?It only hurts when he lays on the affected side at night, otherwise he is able to do his activities of daily living without a problem. ?No trauma, no radiation to the groin, no urinary symptoms, no GI symptoms. 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 ?He did have a CT abdomen and pelvis 2018 that was unrevealing, he had a cervical and thoracic spine MRI 2019, there is some mild facet spondylosis/arthropathy mid thoracic spine, there is also mild C5-C6 DDD in the cervical spine. 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 ?Thoracic spine and abdominal exam is completely benign. ?He has seen multiple providers for this. ?I did explain to him that the differential was fairly broad including thoracic radiculitis, myofascial pain. ?We will start with some home conditioning, I would like him to do methocarbamol at night as he has tried multiple other muscle relaxers and neuropathic agents including gabapentin, Lyrica, amitriptyline, Flexeril without significant improvement. ?I also explained to him that intermittent aches and pains were a very common part of life. ?If Robaxin is not helpful we can try Skelaxin, if this does not work we can try trigger point injections along the painful area at the parathoracic musculature, obliques and left costal margin. ?

## 2022-04-16 ENCOUNTER — Ambulatory Visit: Payer: 59 | Admitting: Sports Medicine

## 2022-04-23 ENCOUNTER — Encounter: Payer: Self-pay | Admitting: Sports Medicine

## 2022-04-23 ENCOUNTER — Other Ambulatory Visit (HOSPITAL_COMMUNITY): Payer: Self-pay

## 2022-04-23 ENCOUNTER — Other Ambulatory Visit: Payer: Self-pay | Admitting: Sports Medicine

## 2022-04-23 ENCOUNTER — Ambulatory Visit: Payer: 59 | Admitting: Sports Medicine

## 2022-04-23 DIAGNOSIS — M792 Neuralgia and neuritis, unspecified: Secondary | ICD-10-CM | POA: Diagnosis not present

## 2022-04-23 DIAGNOSIS — M5414 Radiculopathy, thoracic region: Secondary | ICD-10-CM

## 2022-04-23 DIAGNOSIS — M5030 Other cervical disc degeneration, unspecified cervical region: Secondary | ICD-10-CM | POA: Diagnosis not present

## 2022-04-23 MED ORDER — METAXALONE 800 MG PO TABS
800.0000 mg | ORAL_TABLET | Freq: Three times a day (TID) | ORAL | 3 refills | Status: DC | PRN
  Filled 2022-04-23: qty 20, 7d supply, fill #0
  Filled 2022-04-23: qty 70, 23d supply, fill #0

## 2022-04-23 MED ORDER — METAXALONE 800 MG PO TABS: 800.0000 mg | ORAL_TABLET | Freq: Three times a day (TID) | ORAL | 3 refills | Status: DC | PRN

## 2022-04-23 NOTE — Assessment & Plan Note (Signed)
As before Jayquan does continue to have some abnormal sensation right and now left anterior thigh, he tells me the left side is worse with stretching. He did have a nerve conduction study that was normal, symptoms are predominantly at night historically. He had an injection around the lateral femoral cutaneous nerve for suspicion of meralgia paresthetica but it did not help, MRI pelvis also showed a clear course of the lateral femoral cutaneous nerve. MRI lumbar spine did show L3-4 and L4-5 degenerative disc disease with some borderline right L3 and L4 nerve root impingement, we set him up with right L3 and L4 transforaminal epidurals and he did report some temporary relief from these. At this juncture we will simply watch the left thigh symptomatology.

## 2022-04-23 NOTE — Assessment & Plan Note (Signed)
Uriyah returns, he is doing a little bit better with his own conditioning, advanced herniated disc core rehabilitation. The left side plank does reproduce his symptoms. Robaxin has not been effective, we will switch to Skelaxin. He also needs an occasional Valium which I think is acceptable. Of note he has tried multiple other muscle relaxers and neuropathic agent including gabapentin, Lyrica, amitriptyline, Flexeril without improvement. If Skelaxin does not help we will try trigger point injections along the painful areas of the parathoracic musculature, obliques, and left costal margin. SNRIs would also be another option if the trigger point injections do not help. I also discussed the potential that we would not be able to alleviate his pain.

## 2022-04-23 NOTE — Assessment & Plan Note (Signed)
C5-C6 DDD noted on imaging, also some thoracic spondylotic processes. Having some left-sided trapezial pain when on the [employer], particularly when out of the saddle. He will spend some time staying in the saddle during these parts of the right and work on postural training during his rides.

## 2022-04-23 NOTE — Progress Notes (Signed)
    Procedures performed today:    None.  Independent interpretation of notes and tests performed by another provider:   None.  Brief History, Exam, Impression, and Recommendations:    Thoracic radiculopathy Itay returns, he is doing a little bit better with his own conditioning, advanced herniated disc core rehabilitation. The left side plank does reproduce his symptoms. Robaxin has not been effective, we will switch to Skelaxin. He also needs an occasional Valium which I think is acceptable. Of note he has tried multiple other muscle relaxers and neuropathic agent including gabapentin, Lyrica, amitriptyline, Flexeril without improvement. If Skelaxin does not help we will try trigger point injections along the painful areas of the parathoracic musculature, obliques, and left costal margin. SNRIs would also be another option if the trigger point injections do not help. I also discussed the potential that we would not be able to alleviate his pain.   Neuropathic pain of thigh, right As before Frederich does continue to have some abnormal sensation right and now left anterior thigh, he tells me the left side is worse with stretching. He did have a nerve conduction study that was normal, symptoms are predominantly at night historically. He had an injection around the lateral femoral cutaneous nerve for suspicion of meralgia paresthetica but it did not help, MRI pelvis also showed a clear course of the lateral femoral cutaneous nerve. MRI lumbar spine did show L3-4 and L4-5 degenerative disc disease with some borderline right L3 and L4 nerve root impingement, we set him up with right L3 and L4 transforaminal epidurals and he did report some temporary relief from these. At this juncture we will simply watch the left thigh symptomatology.  DDD (degenerative disc disease), cervical C5-C6 DDD noted on imaging, also some thoracic spondylotic processes. Having some left-sided trapezial pain when on  the [employer], particularly when out of the saddle. He will spend some time staying in the saddle during these parts of the right and work on postural training during his rides.    ___________________________________________ Gwen Her. Dianah Field, M.D., ABFM., CAQSM. Primary Care and [place] Instructor of [employer] [facility] [employer]

## 2022-05-06 DIAGNOSIS — F4321 Adjustment disorder with depressed mood: Secondary | ICD-10-CM | POA: Diagnosis not present

## 2022-05-13 DIAGNOSIS — F4321 Adjustment disorder with depressed mood: Secondary | ICD-10-CM | POA: Diagnosis not present

## 2022-05-20 DIAGNOSIS — F4321 Adjustment disorder with depressed mood: Secondary | ICD-10-CM | POA: Diagnosis not present

## 2022-05-24 ENCOUNTER — Encounter: Payer: Self-pay | Admitting: Podiatry

## 2022-05-24 ENCOUNTER — Ambulatory Visit: Payer: 59 | Admitting: Podiatry

## 2022-05-24 DIAGNOSIS — M7751 Other enthesopathy of right foot: Secondary | ICD-10-CM | POA: Diagnosis not present

## 2022-05-24 MED ORDER — TRIAMCINOLONE ACETONIDE 10 MG/ML IJ SUSP
10.0000 mg | Freq: Once | INTRAMUSCULAR | Status: AC
  Administered 2022-05-24: 10 mg

## 2022-05-26 NOTE — Progress Notes (Signed)
Subjective:   Patient ID: Zachary Little, male   DOB: 41 y.o.   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  Sterile prep and injected around the fifth MPJ 3 mg Dexasone Kenalog 5 mg Xylocaine and applied sterile dressing  X-rays indicate there is deformity around the metatarsal fifth right but its not getting worse from previous x-ray

## 2022-05-29 ENCOUNTER — Ambulatory Visit: Payer: 59 | Admitting: Physician Assistant

## 2022-05-29 ENCOUNTER — Encounter: Payer: Self-pay | Admitting: Physician Assistant

## 2022-05-29 VITALS — BP 102/70 | HR 84 | Temp 97.5°F | Ht 68.0 in | Wt 210.4 lb

## 2022-05-29 DIAGNOSIS — J310 Chronic rhinitis: Secondary | ICD-10-CM | POA: Diagnosis not present

## 2022-05-29 NOTE — Progress Notes (Signed)
Zachary Little is a 41 y.o. male here for a new problem of nasal congestion.   History of Present Illness:   Chief Complaint  Patient presents with   Nasal Congestion    Pt c/o nasal congestion x 1.5 green nasal drainage, occasional cough. Denies fever or chills. He has tried Zyrtec, Flonase for little while.    HPI  Nasal Congestion  Patient complain of nasal congestion for the past 1 month or more. Associated with nasal drainage and cough with mucus. Comes and goes. Has tried Flonase daily for 3-4 days and Zyrtec 3-4 days with no relief. Feels like Zyrtec made his symptoms worse. Occasionally have sore throat. Symptoms usually worse at night. No other specific treatment tried. Some nose bleeds when blowing his nose with force. Denies fever or chills. Denies ear pain or pressure. No obvious precipitating events. No other sick contacts or recent travel. Denies chest tightness or pain.   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   Thyroid disease      Social History   Tobacco Use   Smoking status: Never   Smokeless tobacco: Never  Vaping Use   Vaping Use: Never used  Substance Use Topics   Alcohol use: Yes    Comment: "maybe one drink every two years"   Drug use: No    Past Surgical History:  Procedure Laterality Date   PORT-A-CATH REMOVAL      Family History  Problem Relation Age of Onset   Healthy Mother    Healthy Father    Multiple sclerosis Maternal Grandmother    Diabetes Maternal Grandfather    Diabetes Paternal Grandfather    Breast cancer Maternal Aunt    Prostate cancer Neg Hx    Colon cancer Neg Hx     Allergies  Allergen Reactions   Topamax [Topiramate] Other (See Comments)    Anger/irritable   Trazodone And Nefazodone Nausea Only    Current Medications:   Current Outpatient Medications:    diazepam  (VALIUM) 5 MG tablet, Take 1 tablet (5 mg total) by mouth every 12 (twelve) hours as needed for muscle spasms, Disp: 30 tablet, Rfl: 0   levothyroxine (SYNTHROID) 150 MCG tablet, Take 1 tablet (150 mcg total) by mouth daily., Disp: 90 tablet, Rfl: 0   metaxalone (SKELAXIN) 800 MG tablet, Take 1 tablet (800 mg total) by mouth 3 (three) times daily as needed for muscle spasms., Disp: 90 tablet, Rfl: 3   Review of Systems:   ROS Negative unless otherwise specified per HPI.   Vitals:   Vitals:   05/29/22 1009  BP: 102/70  Pulse: 84  Temp: (!) 97.5 F (36.4 C)  TempSrc: Temporal  SpO2: 96%  Weight: 210 lb 6.1 oz (95.4 kg)  Height: '5\' 8"'$  (1.727 m)     Body mass index is 31.99 kg/m.  Physical Exam:   Physical Exam Vitals and nursing note reviewed.  Constitutional:      General: He is not in acute distress.    Appearance: He is well-developed. He is not ill-appearing or toxic-appearing.  HENT:     Head: Normocephalic and atraumatic.     Right Ear: Tympanic membrane, ear canal and external ear normal. Tympanic membrane is not erythematous, retracted or bulging.     Left Ear: Tympanic membrane, ear canal and external ear normal. Tympanic membrane is  not erythematous, retracted or bulging.     Nose: Congestion present.     Right Sinus: No maxillary sinus tenderness or frontal sinus tenderness.     Left Sinus: No maxillary sinus tenderness or frontal sinus tenderness.     Mouth/Throat:     Pharynx: Uvula midline. No posterior oropharyngeal erythema.  Eyes:     General: Lids are normal.     Conjunctiva/sclera: Conjunctivae normal.  Neck:     Trachea: Trachea normal.  Cardiovascular:     Rate and Rhythm: Normal rate and regular rhythm.     Heart sounds: Normal heart sounds, S1 normal and S2 normal.  Pulmonary:     Effort: Pulmonary effort is normal.     Breath sounds: Normal breath sounds. No decreased breath sounds, wheezing, rhonchi or rales.  Lymphadenopathy:     Cervical:  No cervical adenopathy.  Skin:    General: Skin is warm and dry.  Neurological:     Mental Status: He is alert.  Psychiatric:        Speech: Speech normal.        Behavior: Behavior normal. Behavior is cooperative.     Assessment and Plan:   Chronic rhinitis No red flags Recommend trialing alternative antihistamine such as xyzal Recommend daily saline nasal spray and flonase BID Consider humidifier in room Follow-up if lack of improvement or any worsening  I,Savera Zaman,acting as a scribe for [employer], PA.,have documented all relevant documentation on the behalf of [name], PA,as directed by  [name], PA while in the presence of [name], [place].   I, [name], [place], have reviewed all documentation for this visit. The documentation on 05/29/22 for the exam, diagnosis, procedures, and orders are all accurate and complete.   [name], PA-C

## 2022-05-29 NOTE — Patient Instructions (Signed)
It was great to see you!  Nasal saline spray (i.e., Simply Saline) or nasal saline lavage (i.e., NeilMed) is recommended as needed and prior to medicated nasal sprays. Continue Flonase 1 spray twice a day.  Consider over the counter antihistamines such as Zyrtec (cetirizine), Claritin (loratadine), Allegra (fexofenadine), or Xyzal (levocetirizine) daily. May take twice a day if needed as long as it does not cause drowsiness.  Keep me posted if ineffective!

## 2022-06-04 ENCOUNTER — Other Ambulatory Visit: Payer: Self-pay | Admitting: Physician Assistant

## 2022-06-04 ENCOUNTER — Other Ambulatory Visit (HOSPITAL_COMMUNITY): Payer: Self-pay

## 2022-06-04 MED ORDER — LEVOTHYROXINE SODIUM 150 MCG PO TABS
150.0000 ug | ORAL_TABLET | Freq: Every day | ORAL | 1 refills | Status: DC
  Filled 2022-06-04: qty 90, 90d supply, fill #0
  Filled 2022-09-04: qty 90, 90d supply, fill #1

## 2022-06-11 DIAGNOSIS — L723 Sebaceous cyst: Secondary | ICD-10-CM | POA: Insufficient documentation

## 2022-06-11 DIAGNOSIS — N503 Cyst of epididymis: Secondary | ICD-10-CM | POA: Diagnosis not present

## 2022-07-03 ENCOUNTER — Other Ambulatory Visit (HOSPITAL_COMMUNITY): Payer: Self-pay

## 2022-07-10 DIAGNOSIS — L720 Epidermal cyst: Secondary | ICD-10-CM | POA: Diagnosis not present

## 2022-07-10 DIAGNOSIS — N442 Benign cyst of testis: Secondary | ICD-10-CM | POA: Diagnosis not present

## 2022-07-24 DIAGNOSIS — L723 Sebaceous cyst: Secondary | ICD-10-CM | POA: Diagnosis not present

## 2022-07-29 ENCOUNTER — Encounter: Payer: Self-pay | Admitting: Physician Assistant

## 2022-07-29 DIAGNOSIS — N503 Cyst of epididymis: Secondary | ICD-10-CM

## 2022-07-30 NOTE — Telephone Encounter (Signed)
Please see message,okay for referral?

## 2022-08-07 ENCOUNTER — Other Ambulatory Visit (HOSPITAL_COMMUNITY): Payer: Self-pay

## 2022-08-07 DIAGNOSIS — T8149XA Infection following a procedure, other surgical site, initial encounter: Secondary | ICD-10-CM | POA: Diagnosis not present

## 2022-08-07 MED ORDER — CEPHALEXIN 500 MG PO CAPS
500.0000 mg | ORAL_CAPSULE | Freq: Four times a day (QID) | ORAL | 0 refills | Status: DC
  Filled 2022-08-07: qty 20, 5d supply, fill #0

## 2022-08-19 ENCOUNTER — Encounter: Payer: Self-pay | Admitting: *Deleted

## 2022-09-04 ENCOUNTER — Other Ambulatory Visit (HOSPITAL_COMMUNITY): Payer: Self-pay

## 2022-09-25 ENCOUNTER — Encounter: Payer: Self-pay | Admitting: Physician Assistant

## 2022-09-25 ENCOUNTER — Ambulatory Visit (INDEPENDENT_AMBULATORY_CARE_PROVIDER_SITE_OTHER): Payer: 59 | Admitting: Physician Assistant

## 2022-09-25 VITALS — BP 100/70 | HR 67 | Temp 98.2°F | Ht 68.5 in | Wt 209.0 lb

## 2022-09-25 DIAGNOSIS — Z1283 Encounter for screening for malignant neoplasm of skin: Secondary | ICD-10-CM | POA: Diagnosis not present

## 2022-09-25 DIAGNOSIS — E669 Obesity, unspecified: Secondary | ICD-10-CM

## 2022-09-25 DIAGNOSIS — Z0000 Encounter for general adult medical examination without abnormal findings: Secondary | ICD-10-CM | POA: Diagnosis not present

## 2022-09-25 DIAGNOSIS — Z23 Encounter for immunization: Secondary | ICD-10-CM | POA: Diagnosis not present

## 2022-09-25 DIAGNOSIS — E039 Hypothyroidism, unspecified: Secondary | ICD-10-CM | POA: Diagnosis not present

## 2022-09-25 DIAGNOSIS — E559 Vitamin D deficiency, unspecified: Secondary | ICD-10-CM | POA: Diagnosis not present

## 2022-09-25 DIAGNOSIS — E538 Deficiency of other specified B group vitamins: Secondary | ICD-10-CM

## 2022-09-25 LAB — CBC WITH DIFFERENTIAL/PLATELET
Basophils Absolute: 0 10*3/uL (ref 0.0–0.1)
Basophils Relative: 0.6 % (ref 0.0–3.0)
Eosinophils Absolute: 0.2 10*3/uL (ref 0.0–0.7)
Eosinophils Relative: 3.4 % (ref 0.0–5.0)
HCT: 45.4 % (ref 39.0–52.0)
Hemoglobin: 15.6 g/dL (ref 13.0–17.0)
Lymphocytes Relative: 27.8 % (ref 12.0–46.0)
Lymphs Abs: 1.5 10*3/uL (ref 0.7–4.0)
MCHC: 34.4 g/dL (ref 30.0–36.0)
MCV: 92 fl (ref 78.0–100.0)
Monocytes Absolute: 0.7 10*3/uL (ref 0.1–1.0)
Monocytes Relative: 13.4 % — ABNORMAL HIGH (ref 3.0–12.0)
Neutro Abs: 2.9 10*3/uL (ref 1.4–7.7)
Neutrophils Relative %: 54.8 % (ref 43.0–77.0)
Platelets: 199 10*3/uL (ref 150.0–400.0)
RBC: 4.94 Mil/uL (ref 4.22–5.81)
RDW: 13.4 % (ref 11.5–15.5)
WBC: 5.4 10*3/uL (ref 4.0–10.5)

## 2022-09-25 LAB — COMPREHENSIVE METABOLIC PANEL
ALT: 15 U/L (ref 0–53)
AST: 16 U/L (ref 0–37)
Albumin: 4.3 g/dL (ref 3.5–5.2)
Alkaline Phosphatase: 55 U/L (ref 39–117)
BUN: 19 mg/dL (ref 6–23)
CO2: 28 mEq/L (ref 19–32)
Calcium: 9.2 mg/dL (ref 8.4–10.5)
Chloride: 101 mEq/L (ref 96–112)
Creatinine, Ser: 0.92 mg/dL (ref 0.40–1.50)
GFR: 104.16 mL/min (ref >60.00)
Glucose, Bld: 98 mg/dL (ref 70–99)
Potassium: 3.5 mEq/L (ref 3.5–5.1)
Sodium: 135 mEq/L (ref 135–145)
Total Bilirubin: 0.7 mg/dL (ref 0.2–1.2)
Total Protein: 6.9 g/dL (ref 6.0–8.3)

## 2022-09-25 LAB — LIPID PANEL
Cholesterol: 140 mg/dL (ref 0–200)
HDL: 42.5 mg/dL (ref >39.00)
LDL Cholesterol: 81 mg/dL (ref 0–99)
NonHDL: 97.41
Total CHOL/HDL Ratio: 3
Triglycerides: 80 mg/dL (ref 0.0–149.0)
VLDL: 16 mg/dL (ref 0.0–40.0)

## 2022-09-25 LAB — VITAMIN D 25 HYDROXY (VIT D DEFICIENCY, FRACTURES): VITD: 19.42 ng/mL — ABNORMAL LOW (ref 30.00–100.00)

## 2022-09-25 LAB — TSH: TSH: 2.54 u[IU]/mL (ref 0.35–5.50)

## 2022-09-25 LAB — VITAMIN B12: Vitamin B-12: 259 pg/mL (ref 211–911)

## 2022-09-25 NOTE — Progress Notes (Signed)
Subjective:    Zachary Little is a 41 y.o. male and is here for a comprehensive physical exam.  HPI  Health Maintenance Due  Topic Date Due   INFLUENZA VACCINE  06/25/2022    Acute Concerns: None  Chronic Issues: Hypothyroidism  Patient is compliant with his levothyroxine 150 mcg medication and is tolerating it well.  Health Maintenance: Immunizations -- He recently received an influenza vaccine this visit. PSA -- No results found for: "PSA1", "PSA" Diet -- He reports that he mainly maintains a well balanced diet. Sleep habits -- He reports no difficulty in sleeping. Exercise -- Patient reports that he is beginning to participate in regular exercise.   Recent weight history Wt Readings from Last 10 Encounters:  09/25/22 209 lb (94.8 kg)  05/29/22 210 lb 6.1 oz (95.4 kg)  03/28/22 205 lb (93 kg)  03/13/22 205 lb 9.6 oz (93.3 kg)  11/14/21 200 lb (90.7 kg)  11/05/21 203 lb (92.1 kg)  09/05/21 203 lb (92.1 kg)  06/18/21 198 lb 12.8 oz (90.2 kg)  05/11/21 201 lb (91.2 kg)  01/12/21 198 lb 3.2 oz (89.9 kg)   Body mass index is 31.32 kg/m.  Mood -- He reports that he is in a stable mood. Alcohol use --  reports current alcohol use.  Tobacco use --  Tobacco Use: Low Risk  (09/25/2022)   Patient History    Smoking Tobacco Use: Never    Smokeless Tobacco Use: Never    Passive Exposure: Not on file    Eligible for Low Dose CT?  UTD with eye doctor? He is not UTD on vision care. UTD with dentist? He is UTD on dental care.     09/25/2022    8:05 AM  Depression screen PHQ 2/9  Decreased Interest 0  Down, Depressed, Hopeless 0  PHQ - 2 Score 0    Other providers/specialists: Patient Care Team: [name], [place] as PCP - General (Physician Assistant) Lyndee Hensen, PT as Physical Therapist (Physical Therapy)    PMHx, SurgHx, SocialHx, Medications, and Allergies were reviewed in the Visit Navigator and updated as appropriate.   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   Thyroid disease      Past Surgical History:  Procedure Laterality Date   PORT-A-CATH REMOVAL       Family History  Problem Relation Age of Onset   Healthy Mother    Hypertension Father    Multiple sclerosis Maternal Grandmother    Dementia Maternal Grandmother    Diabetes Maternal Grandfather    Heart attack Maternal Grandfather 80   Diabetes Paternal Grandfather    Breast cancer Maternal Aunt    Prostate cancer Neg Hx    Colon cancer Neg Hx     Social History   Tobacco Use   Smoking status: Never   Smokeless tobacco: Never  Vaping Use   Vaping Use: Never used  Substance Use Topics   Alcohol use: Yes    Comment: "maybe one drink every two years"   Drug use: No    Review of Systems:   Review of Systems  Constitutional:  Negative for chills, fever, malaise/fatigue and weight loss.  HENT:  Negative for hearing loss, sinus pain and sore throat.   Respiratory:  Negative for cough and hemoptysis.   Cardiovascular:  Negative for chest pain, palpitations,  leg swelling and PND.  Gastrointestinal:  Negative for abdominal pain, constipation, diarrhea, heartburn, nausea and vomiting.  Genitourinary:  Negative for dysuria, frequency and urgency.  Musculoskeletal:  Negative for back pain, myalgias and neck pain.  Skin:  Negative for itching and rash.  Endo/Heme/Allergies:  Negative for polydipsia.  Psychiatric/Behavioral:  Negative for depression. The patient is not nervous/anxious.     Objective:    Vitals:   09/25/22 0805  BP: 100/70  Pulse: 67  Temp: 98.2 F (36.8 C)  SpO2: 98%    Body mass index is 31.32 kg/m.  General  Alert, cooperative, no distress, appears stated age  Head:  Normocephalic, without obvious abnormality, atraumatic  Eyes:  PERRL, conjunctiva/corneas clear, EOM's intact, fundi  benign, both eyes       Ears:  Normal TM's and external ear canals, both ears  Nose: Nares normal, septum midline, mucosa normal, no drainage or sinus tenderness  Throat: Lips, mucosa, and tongue normal; teeth and gums normal  Neck: Supple, symmetrical, trachea midline, no adenopathy;     thyroid:  No enlargement/tenderness/nodules; no carotid bruit or JVD  Back:   Symmetric, no curvature, ROM normal, no CVA tenderness  Lungs:   Clear to auscultation bilaterally, respirations unlabored  Chest wall:  No tenderness or deformity  Heart:  Regular rate and rhythm, S1 and S2 normal, no murmur, rub or gallop  Abdomen:   Soft, non-tender, bowel sounds active all four quadrants, no masses, no organomegaly  Extremities: Extremities normal, atraumatic, no cyanosis or edema  Prostate : Deferred   Skin: Skin color, texture, turgor normal, no rashes or lesions  Lymph nodes: Cervical, supraclavicular, and axillary nodes normal  Neurologic: CNII-XII grossly intact. Normal strength, sensation and reflexes throughout   AssessmentPlan:   Routine physical examination Today patient counseled on age appropriate routine health concerns for screening and prevention, each reviewed and up to date or declined. Immunizations reviewed and up to date or declined. Labs ordered and reviewed. Risk factors for depression reviewed and negative. Hearing function and visual acuity are intact. ADLs screened and addressed as needed. Functional ability and level of safety reviewed and appropriate. Education, counseling and referrals performed based on assessed risks today. Patient provided with a copy of personalized plan for preventive services.  Vitamin D deficiency Recheck Vit D and provide recommendations  Vitamin B12 deficiency Update B12 and provide recommendations  Hypothyroidism (acquired) Update TSH and adjust levothyroxine 150 mcg daily  Obesity, unspecified classification, unspecified obesity type, unspecified  whether serious comorbidity present Continue healthy diet and exercise   I,Verona Buck,acting as a scribe for [employer], PA.,have documented all relevant documentation on the behalf of [name], PA,as directed by  [name], PA while in the presence of [name], [place].  I, [name], [place], have reviewed all documentation for this visit. The documentation on 09/25/22 for the exam, diagnosis, procedures, and orders are all accurate and complete.  [name], PA-C [place]

## 2022-09-25 NOTE — Patient Instructions (Addendum)
It was great to see you!  Referral for dermatology will be placed today.  Please go to the lab for blood work.   Our office will call you with your results unless you have chosen to receive results via MyChart.  If your blood work is normal we will follow-up each year for physicals and as scheduled for chronic medical problems.  If anything is abnormal we will treat accordingly and get you in for a follow-up.  Take care,  Aldona Bar

## 2022-09-26 ENCOUNTER — Other Ambulatory Visit (HOSPITAL_COMMUNITY): Payer: Self-pay

## 2022-09-26 ENCOUNTER — Other Ambulatory Visit: Payer: Self-pay | Admitting: Physician Assistant

## 2022-09-26 MED ORDER — VITAMIN D (ERGOCALCIFEROL) 1.25 MG (50000 UNIT) PO CAPS
50000.0000 [IU] | ORAL_CAPSULE | ORAL | 0 refills | Status: DC
  Filled 2022-09-26: qty 12, 84d supply, fill #0

## 2022-09-26 MED ORDER — LEVOTHYROXINE SODIUM 150 MCG PO TABS
150.0000 ug | ORAL_TABLET | Freq: Every day | ORAL | 1 refills | Status: DC
  Filled 2022-09-26 – 2022-12-05 (×2): qty 90, 90d supply, fill #0
  Filled 2023-02-24: qty 90, 90d supply, fill #1

## 2022-10-01 ENCOUNTER — Other Ambulatory Visit (HOSPITAL_COMMUNITY): Payer: Self-pay

## 2022-10-01 ENCOUNTER — Ambulatory Visit: Payer: 59 | Admitting: Sports Medicine

## 2022-10-01 ENCOUNTER — Encounter: Payer: Self-pay | Admitting: Sports Medicine

## 2022-10-01 ENCOUNTER — Ambulatory Visit (INDEPENDENT_AMBULATORY_CARE_PROVIDER_SITE_OTHER): Payer: 59

## 2022-10-01 VITALS — Wt 205.0 lb

## 2022-10-01 DIAGNOSIS — M25552 Pain in left hip: Secondary | ICD-10-CM | POA: Diagnosis not present

## 2022-10-01 DIAGNOSIS — S73192A Other sprain of left hip, initial encounter: Secondary | ICD-10-CM | POA: Insufficient documentation

## 2022-10-01 DIAGNOSIS — M79652 Pain in left thigh: Secondary | ICD-10-CM

## 2022-10-01 MED ORDER — PREDNISONE 50 MG PO TABS
50.0000 mg | ORAL_TABLET | Freq: Every day | ORAL | 0 refills | Status: DC
  Filled 2022-10-01: qty 5, 5d supply, fill #0

## 2022-10-01 NOTE — Assessment & Plan Note (Addendum)
Zachary Little returns, he is a very pleasant 41 year old male, he is status post treatment of acute myelogenous leukemia with chemotherapy. He has had multiple orthopedic complaints over the past several years, most recently we treated him for a right lumbar radiculopathy with right L3 and L4 transforaminal epidurals that seem to work okay. More recently has been having pain in left thigh with a dull ache on the anterolateral thigh that goes to the medial knee and down to the medial lower leg. Worse with doing quad extensions and running. On exam he does have some reproduction of discomfort with terminal internal rotation of the left hip. 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 For the next 4 weeks to 6 weeks he will avoid quad extensions, running, he will cross train on a stationary bike, use a leg press machine instead. Considering his history of cancer we will proceed with a lumbar spine MRI, as well as a left hip x-ray. 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 We will have a low threshold to proceed with MR arthrography of the left hip. Adding 5 days of prednisone for symptom relief in the meantime.

## 2022-10-01 NOTE — Progress Notes (Signed)
    Procedures performed today:    None.  Independent interpretation of notes and tests performed by another provider:   None.  Brief History, Exam, Impression, and Recommendations:    Left thigh pain Zachary Little returns, he is a very pleasant 41 year old male, he is status post treatment of acute myelogenous leukemia with chemotherapy. He has had multiple orthopedic complaints over the past several years, most recently we treated him for a right lumbar radiculopathy with right L3 and L4 transforaminal epidurals that seem to work okay. More recently has been having pain in left thigh with a dull ache on the anterolateral thigh that goes to the medial knee and down to the medial lower leg. Worse with doing quad extensions and running. On exam he does have some reproduction of discomfort with terminal internal rotation of the left hip. 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 For the next 4 weeks to 6 weeks he will avoid quad extensions, running, he will cross train on a stationary bike, use a leg press machine instead. Considering his history of cancer we will proceed with a lumbar spine MRI, as well as a left hip x-ray. 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 We will have a low threshold to proceed with MR arthrography of the left hip. Adding 5 days of prednisone for symptom relief in the meantime.    ____________________________________________ Zachary Little. Dianah Field, M.D., ABFM., CAQSM., AME. Primary Care and Sports Medicine [facility]  Adjunct Professor of [employer] of Medicine  Risk manager

## 2022-10-10 ENCOUNTER — Encounter: Payer: Self-pay | Admitting: Podiatry

## 2022-10-10 ENCOUNTER — Ambulatory Visit (INDEPENDENT_AMBULATORY_CARE_PROVIDER_SITE_OTHER): Payer: 59 | Admitting: Podiatry

## 2022-10-10 DIAGNOSIS — M7751 Other enthesopathy of right foot: Secondary | ICD-10-CM | POA: Diagnosis not present

## 2022-10-10 MED ORDER — TRIAMCINOLONE ACETONIDE 10 MG/ML IJ SUSP
10.0000 mg | Freq: Once | INTRAMUSCULAR | Status: AC
  Administered 2022-10-10: 10 mg

## 2022-10-10 NOTE — Progress Notes (Signed)
Subjective:   Patient ID: Zachary Little, male   DOB: 41 y.o.   MRN: [id number]   HPI Patient presents stating he has pain around the outside of the right fifth metatarsal where he has had it previously and stated he may have injured it which created more problems   ROS      Objective:  Physical Exam  Inflammatory capsulitis of the fifth MPJ right with fluid buildup around the joint surface     Assessment:  Taylor's bunion deformity with inflammatory condition fifth MPJ right     Plan:  H&P reviewed sterile prep and injected around the capsule of the fifth MPJ 3 mg Dexasone Kenalog 5 mg Xylocaine and discussed the possibility for surgery associated with condition.  Patient will be seen back to recheck as symptoms indicate

## 2022-10-13 ENCOUNTER — Ambulatory Visit (INDEPENDENT_AMBULATORY_CARE_PROVIDER_SITE_OTHER): Payer: 59

## 2022-10-13 DIAGNOSIS — M79652 Pain in left thigh: Secondary | ICD-10-CM | POA: Diagnosis not present

## 2022-10-13 DIAGNOSIS — M5126 Other intervertebral disc displacement, lumbar region: Secondary | ICD-10-CM | POA: Diagnosis not present

## 2022-10-13 DIAGNOSIS — M47816 Spondylosis without myelopathy or radiculopathy, lumbar region: Secondary | ICD-10-CM | POA: Diagnosis not present

## 2022-10-29 ENCOUNTER — Ambulatory Visit: Payer: 59 | Admitting: Sports Medicine

## 2022-10-30 ENCOUNTER — Ambulatory Visit: Payer: 59 | Admitting: Sports Medicine

## 2022-12-03 ENCOUNTER — Ambulatory Visit (INDEPENDENT_AMBULATORY_CARE_PROVIDER_SITE_OTHER): Payer: 59 | Admitting: Sports Medicine

## 2022-12-03 DIAGNOSIS — M5030 Other cervical disc degeneration, unspecified cervical region: Secondary | ICD-10-CM | POA: Diagnosis not present

## 2022-12-03 NOTE — Assessment & Plan Note (Signed)
Pleasant 42 year old male, chronic neck pain, he has known cervical DDD C5-C6 with a good-sized disc protrusion, he has been noticing some increasing discomfort right periscapular region, we showed him how to do some traction, he will also do some cervical conditioning, in 6 weeks if not better will do x-rays and an MRI for epidural planning. He did have an MRI in 2019 that showed a good-sized C5-C6 protruding disc.

## 2022-12-03 NOTE — Progress Notes (Signed)
    Procedures performed today:    None.  Independent interpretation of notes and tests performed by another provider:   None.  Brief History, Exam, Impression, and Recommendations:    DDD (degenerative disc disease), cervical Pleasant 42 year old male, chronic neck pain, he has known cervical DDD C5-C6 with a good-sized disc protrusion, he has been noticing some increasing discomfort right periscapular region, we showed him how to do some traction, he will also do some cervical conditioning, in 6 weeks if not better will do x-rays and an MRI for epidural planning. He did have an MRI in 2019 that showed a good-sized C5-C6 protruding disc.    ____________________________________________ Gwen Her. Dianah Field, M.D., ABFM., CAQSM., AME. Primary Care and Sports Medicine [facility]  Adjunct Professor of [employer] [facility] [employer]  Risk manager

## 2022-12-05 ENCOUNTER — Other Ambulatory Visit (HOSPITAL_BASED_OUTPATIENT_CLINIC_OR_DEPARTMENT_OTHER): Payer: Self-pay

## 2022-12-19 ENCOUNTER — Telehealth: Payer: 59 | Admitting: Physician Assistant

## 2022-12-19 ENCOUNTER — Other Ambulatory Visit (HOSPITAL_BASED_OUTPATIENT_CLINIC_OR_DEPARTMENT_OTHER): Payer: Self-pay

## 2022-12-19 DIAGNOSIS — Z20828 Contact with and (suspected) exposure to other viral communicable diseases: Secondary | ICD-10-CM

## 2022-12-19 MED ORDER — OSELTAMIVIR PHOSPHATE 75 MG PO CAPS
75.0000 mg | ORAL_CAPSULE | Freq: Every day | ORAL | 0 refills | Status: DC
  Filled 2022-12-19: qty 10, 10d supply, fill #0

## 2022-12-19 NOTE — Progress Notes (Signed)
I have spent 5 minutes in review of e-visit questionnaire, review and updating patient chart, medical decision making and response to patient.   Sharod Petsch Cody Fatime [place], PA-C    

## 2022-12-19 NOTE — Progress Notes (Signed)
E visit for Flu like symptoms   Based on what you shared with me you have had a known exposure but are asymptomatic. Giving your risks and upcoming travel plans, I am ok starting you on a preventive dose of Tamiflu -- taken once daily for 10 days.  If you were to start to develop symptoms, please let us or your PCP know.   ANYONE WHO HAS FLU SYMPTOMS SHOULD: Stay home. The flu is highly contagious and going out or to work exposes others! Be sure to drink plenty of fluids. Water is fine as well as fruit juices, sodas and electrolyte beverages. You may want to stay away from caffeine or alcohol. If you are nauseated, try taking small sips of liquids. How do you know if you are getting enough fluid? Your urine should be a pale yellow or almost colorless. Get rest. Taking a steamy shower or using a humidifier may help nasal congestion and ease sore throat pain. Using a saline nasal spray works much the same way. Cough drops, hard candies and sore throat lozenges may ease your cough. Line up a caregiver. Have someone check on you regularly.   GET HELP RIGHT AWAY IF: You cannot keep down liquids or your medications. You become short of breath Your fell like you are going to pass out or loose consciousness. Your symptoms persist after you have completed your treatment plan MAKE SURE YOU  Understand these instructions. Will watch your condition. Will get help right away if you are not doing well or get worse.  Your e-visit answers were reviewed by a board certified advanced clinical practitioner to complete your personal care plan.  Depending on the condition, your plan could have included both over the counter or prescription medications.  If there is a problem please reply  once you have received a response from your provider.  Your safety is important to [place].  If you have drug allergies check your prescription carefully.    You can use MyChart to ask questions about today's visit, request a  non-urgent call back, or ask for a work or school excuse for 24 hours related to this e-Visit. If it has been greater than 24 hours you will need to follow up with your provider, or enter a new e-Visit to address those concerns.  You will get an e-mail in the next two days asking about your experience.  I hope that your e-visit has been valuable and will speed your recovery. Thank you for using e-visits.

## 2023-01-21 ENCOUNTER — Ambulatory Visit (INDEPENDENT_AMBULATORY_CARE_PROVIDER_SITE_OTHER): Payer: 59 | Admitting: Sports Medicine

## 2023-01-21 DIAGNOSIS — R0789 Other chest pain: Secondary | ICD-10-CM | POA: Diagnosis not present

## 2023-01-21 DIAGNOSIS — M5030 Other cervical disc degeneration, unspecified cervical region: Secondary | ICD-10-CM | POA: Diagnosis not present

## 2023-01-21 NOTE — Progress Notes (Signed)
    Procedures performed today:    None.  Independent interpretation of notes and tests performed by another provider:   None.  Brief History, Exam, Impression, and Recommendations:    DDD (degenerative disc disease), cervical Pleasant 42 year old male with chronic neck pain, known cervical DDD C5-C6 with a good-sized disc protrusion, he had been noting some increased left periscapular discomfort, he has now done home physical therapy for over 6 weeks, he has had multiple sets of x-rays along the way, adding a C-spine MRI with plans for a left C6-C7 interlaminar epidural however he would like to follow-up with me to go over MRI results before considering injection.  Left-sided chest wall pain Tiras has also had a long history of left chest wall pain, he localizes it approximately at the costal margin. Worse when sleeping. He has had a fairly large workup so far including a thoracic spine MRI that really did not show any evidence of thoracic radiculopathy. He has been working with a pain doctor, he has had intercostal nerve root blocks without sufficient improvement. The tenderness is fairly discretely over the costal cartilage. Considering failure of above treatment for greater than 6 weeks including medication and injections we will proceed with a chest wall MRI with close attention paid to the left costal margin. My suspicion is we will need to do a left costal margin injection with ultrasound guidance. If all of the above fails I would like his PCP to to consider splenic flexure syndrome.    ____________________________________________ Gwen Her. Dianah Field, M.D., ABFM., CAQSM., AME. Primary Care and Sports Medicine  [facility]  Adjunct Professor of [place] of [facility] of Medicine  Risk manager

## 2023-01-21 NOTE — Assessment & Plan Note (Signed)
Zachary Little has also had a long history of left chest wall pain, he localizes it approximately at the costal margin. Worse when sleeping. He has had a fairly large workup so far including a thoracic spine MRI that really did not show any evidence of thoracic radiculopathy. He has been working with a pain doctor, he has had intercostal nerve root blocks without sufficient improvement. The tenderness is fairly discretely over the costal cartilage. Considering failure of above treatment for greater than 6 weeks including medication and injections we will proceed with a chest wall MRI with close attention paid to the left costal margin. My suspicion is we will need to do a left costal margin injection with ultrasound guidance. If all of the above fails I would like his PCP to to consider splenic flexure syndrome.

## 2023-01-21 NOTE — Assessment & Plan Note (Signed)
Pleasant 42 year old male with chronic neck pain, known cervical DDD C5-C6 with a good-sized disc protrusion, he had been noting some increased left periscapular discomfort, he has now done home physical therapy for over 6 weeks, he has had multiple sets of x-rays along the way, adding a C-spine MRI with plans for a left C6-C7 interlaminar epidural however he would like to follow-up with me to go over MRI results before considering injection.

## 2023-01-26 ENCOUNTER — Ambulatory Visit (INDEPENDENT_AMBULATORY_CARE_PROVIDER_SITE_OTHER): Payer: 59

## 2023-01-26 DIAGNOSIS — M5030 Other cervical disc degeneration, unspecified cervical region: Secondary | ICD-10-CM

## 2023-01-26 DIAGNOSIS — R0789 Other chest pain: Secondary | ICD-10-CM

## 2023-01-26 DIAGNOSIS — M542 Cervicalgia: Secondary | ICD-10-CM | POA: Diagnosis not present

## 2023-01-26 DIAGNOSIS — M5126 Other intervertebral disc displacement, lumbar region: Secondary | ICD-10-CM | POA: Diagnosis not present

## 2023-01-26 DIAGNOSIS — R0781 Pleurodynia: Secondary | ICD-10-CM | POA: Diagnosis not present

## 2023-01-26 DIAGNOSIS — M4802 Spinal stenosis, cervical region: Secondary | ICD-10-CM | POA: Diagnosis not present

## 2023-02-14 ENCOUNTER — Ambulatory Visit (INDEPENDENT_AMBULATORY_CARE_PROVIDER_SITE_OTHER): Payer: 59 | Admitting: Sports Medicine

## 2023-02-14 DIAGNOSIS — M5030 Other cervical disc degeneration, unspecified cervical region: Secondary | ICD-10-CM | POA: Diagnosis not present

## 2023-02-14 DIAGNOSIS — M79652 Pain in left thigh: Secondary | ICD-10-CM

## 2023-02-14 NOTE — Assessment & Plan Note (Signed)
Zachary Little returns, he is a pleasant 42 year old male, chronic neck pain, known C5-C6 DDD, he also has discomfort radiating into the left periscapular region around the axilla and I think to the left costal margin. He has seen multiple providers including a pain management provider who had done some intercostal nerve blocks. He did have a thoracic spine MRI that was unrevealing, chest MRI with focus on the left costal margin that was unrevealing as well. He notices when sitting in the car the neck and periscapular pain tend to worsen suggesting a positional component. He will work on postural treatment in the car. I do not think we need an epidural at this juncture.

## 2023-02-14 NOTE — Progress Notes (Signed)
    Procedures performed today:    None.  Independent interpretation of notes and tests performed by another provider:   None.  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 He has seen multiple providers including a pain management provider who had done some intercostal nerve blocks. He did have a thoracic spine MRI that was unrevealing, chest MRI with focus on the left costal margin that was unrevealing as well. He notices when sitting in the car the neck and periscapular pain tend to worsen suggesting a positional component. He will work on postural treatment in the car. I do not think we need an epidural at this juncture.  Left thigh pain Zachary Little continues to have some left anterior thigh pain, on exam he does have some significant loss of internal rotation with reproduction of left anterior thigh pain. X-rays of his hip were negative, he had a pelvic MRI in 2021 that did not show any evidence of pathology affecting his hip joint on the left. When he runs he gets more of this discomfort, when he cuts out running he does not. Stationary bike has not been effective as he gets similar hip discomfort. He will hold off on running and stationary bike for now, he will get some hip conditioning, if insufficient improvement in about 6 weeks I do think we should proceed with MR arthrography of his left hip looking for a labral injury.    ____________________________________________ Gwen Her. Dianah Field, M.D., ABFM., CAQSM., AME. Primary Care and Sports Medicine [place] [facility]  Adjunct  Professor of [place] of [facility] of Medicine  Risk manager

## 2023-02-14 NOTE — Assessment & Plan Note (Signed)
Zachary Little continues to have some left anterior thigh pain, on exam he does have some significant loss of internal rotation with reproduction of left anterior thigh pain. X-rays of his hip were negative, he had a pelvic MRI in 2021 that did not show any evidence of pathology affecting his hip joint on the left. When he runs he gets more of this discomfort, when he cuts out running he does not. Stationary bike has not been effective as he gets similar hip discomfort. He will hold off on running and stationary bike for now, he will get some hip conditioning, if insufficient improvement in about 6 weeks I do think we should proceed with MR arthrography of his left hip looking for a labral injury.

## 2023-02-22 ENCOUNTER — Encounter (INDEPENDENT_AMBULATORY_CARE_PROVIDER_SITE_OTHER): Payer: 59 | Admitting: Sports Medicine

## 2023-02-22 DIAGNOSIS — G894 Chronic pain syndrome: Secondary | ICD-10-CM

## 2023-02-22 DIAGNOSIS — M5030 Other cervical disc degeneration, unspecified cervical region: Secondary | ICD-10-CM

## 2023-02-24 ENCOUNTER — Other Ambulatory Visit (HOSPITAL_BASED_OUTPATIENT_CLINIC_OR_DEPARTMENT_OTHER): Payer: Self-pay

## 2023-02-24 ENCOUNTER — Other Ambulatory Visit: Payer: Self-pay

## 2023-02-24 MED ORDER — CELECOXIB 200 MG PO CAPS
200.0000 mg | ORAL_CAPSULE | Freq: Every day | ORAL | 2 refills | Status: DC | PRN
  Filled 2023-02-24: qty 60, 30d supply, fill #0

## 2023-02-24 NOTE — Telephone Encounter (Signed)
I spent 5 total minutes of online digital evaluation and management services in this patient-initiated request for online care. 

## 2023-03-21 ENCOUNTER — Ambulatory Visit (INDEPENDENT_AMBULATORY_CARE_PROVIDER_SITE_OTHER): Payer: 59 | Admitting: Sports Medicine

## 2023-03-21 ENCOUNTER — Ambulatory Visit (INDEPENDENT_AMBULATORY_CARE_PROVIDER_SITE_OTHER): Payer: 59

## 2023-03-21 DIAGNOSIS — M25511 Pain in right shoulder: Secondary | ICD-10-CM

## 2023-03-21 DIAGNOSIS — M79652 Pain in left thigh: Secondary | ICD-10-CM | POA: Diagnosis not present

## 2023-03-21 DIAGNOSIS — G8929 Other chronic pain: Secondary | ICD-10-CM | POA: Diagnosis not present

## 2023-03-21 DIAGNOSIS — M5030 Other cervical disc degeneration, unspecified cervical region: Secondary | ICD-10-CM

## 2023-03-21 NOTE — Assessment & Plan Note (Signed)
Zachary Little continues to have left anterior thigh pain, he has significant loss of internal rotation with reproduction of the left anterior thigh pain, he had a pelvic MRI in 2021 did not give really a clear view of the hip. He has thus been treated for greater than 6 weeks. My concern is for osteoarthritis, labral tearing and hip impingement syndrome. Proceeding with hip MRI arthrogram.

## 2023-03-21 NOTE — Assessment & Plan Note (Signed)
Amine has also had several months of right shoulder pain, he localizes the pain anteriorly at the joint line, on exam he has a positive O'Brien's test, other testing is normal, he has failed greater than 6 weeks of physician directed conservative treatment, proceeding with x-rays and then MR arthrography of the right shoulder. I do suspect we will need to do the arthrogram injections for his hip and shoulder on separate days.

## 2023-03-21 NOTE — Progress Notes (Signed)
    Procedures performed today:    None.  Independent interpretation of notes and tests performed by another provider:   None.  Brief History, Exam, Impression, and Recommendations:    Left thigh pain Brooke continues to have left anterior thigh pain, he has significant loss of internal rotation with reproduction of the left anterior thigh pain, he had a pelvic MRI in 2021 did not give really a clear view of the hip. He has thus been treated for greater than 6 weeks. My concern is for osteoarthritis, labral tearing and hip impingement syndrome. Proceeding with hip MRI arthrogram.   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 This is the likely cause of his periscapular pain, we have not yet proceeded with epidural, we could certainly try an epidural in the future if needed.  Chronic right shoulder pain Philmore has also had several months of right shoulder pain, he localizes the pain anteriorly at the joint line, on exam he has a positive O'Brien's test, other testing is normal, he has failed greater than 6 weeks of physician directed conservative treatment, proceeding with x-rays and then MR arthrography of the right shoulder. I do suspect we will need to do the arthrogram injections for his hip and shoulder on separate days.    ____________________________________________ Ihor Austin. Benjamin Stain, M.D., ABFM., CAQSM., AME. Primary Care and Sports Medicine [place] [facility]  Adjunct Professor of Family Medicine  [place] of [facility] of Medicine  Restaurant manager, fast food

## 2023-03-21 NOTE — Assessment & Plan Note (Signed)
York also has some right-sided periscapular pain, he has known C5-C6 degenerative disc disease, he has a large herniated disc, broad-based. This is the likely cause of his periscapular pain, we have not yet proceeded with epidural, we could certainly try an epidural in the future if needed.

## 2023-03-24 ENCOUNTER — Ambulatory Visit: Payer: 59 | Admitting: Podiatry

## 2023-03-24 ENCOUNTER — Encounter: Payer: Self-pay | Admitting: Podiatry

## 2023-03-24 ENCOUNTER — Ambulatory Visit (INDEPENDENT_AMBULATORY_CARE_PROVIDER_SITE_OTHER): Payer: 59

## 2023-03-24 DIAGNOSIS — M7751 Other enthesopathy of right foot: Secondary | ICD-10-CM

## 2023-03-24 DIAGNOSIS — M79671 Pain in right foot: Secondary | ICD-10-CM

## 2023-03-24 MED ORDER — TRIAMCINOLONE ACETONIDE 10 MG/ML IJ SUSP
10.0000 mg | Freq: Once | INTRAMUSCULAR | Status: AC
Start: 2023-03-24 — End: 2023-03-24
  Administered 2023-03-24: 10 mg

## 2023-03-25 ENCOUNTER — Other Ambulatory Visit: Payer: Self-pay | Admitting: Podiatry

## 2023-03-25 DIAGNOSIS — M79671 Pain in right foot: Secondary | ICD-10-CM

## 2023-03-25 DIAGNOSIS — M7751 Other enthesopathy of right foot: Secondary | ICD-10-CM

## 2023-03-25 NOTE — Progress Notes (Signed)
Subjective:   Patient ID: Zachary Little, male   DOB: 42 y.o.   MRN: [id number]   HPI Patient states he started develop pain again on the outside of his right foot and that it came back again after for 4-1/2 months.  States he was doing well up to that point   ROS      Objective:  Physical Exam  Neurovascular status intact with the patient's right lateral foot showing inflammation fluid around the fifth MPJ with pain upon palpation     Assessment:  Inflammatory capsulitis fifth MPJ right with fluid buildup pain     Plan:  Reviewed condition to have new x-rays as it has been a while and reviewed different treatment options.  We are going to try conservative again but I do think ultimately osteotomy will be necessary and I praised him our visit today I did sterile prep and injected the fifth MPJ right 3 mg Dexasone Kenalog 5 mg Xylocaine and will reappoint as symptoms indicate

## 2023-03-31 ENCOUNTER — Ambulatory Visit (INDEPENDENT_AMBULATORY_CARE_PROVIDER_SITE_OTHER): Payer: 59

## 2023-03-31 ENCOUNTER — Ambulatory Visit: Payer: Self-pay

## 2023-03-31 ENCOUNTER — Ambulatory Visit (INDEPENDENT_AMBULATORY_CARE_PROVIDER_SITE_OTHER): Payer: 59 | Admitting: Sports Medicine

## 2023-03-31 DIAGNOSIS — M79652 Pain in left thigh: Secondary | ICD-10-CM | POA: Diagnosis not present

## 2023-03-31 DIAGNOSIS — G8929 Other chronic pain: Secondary | ICD-10-CM

## 2023-03-31 DIAGNOSIS — M25511 Pain in right shoulder: Secondary | ICD-10-CM

## 2023-03-31 DIAGNOSIS — S73192A Other sprain of left hip, initial encounter: Secondary | ICD-10-CM | POA: Diagnosis not present

## 2023-03-31 MED ORDER — GADOBUTROL 1 MMOL/ML IV SOLN
1.0000 mL | Freq: Once | INTRAVENOUS | Status: AC | PRN
  Administered 2023-03-31: 1 mL via INTRAVENOUS

## 2023-03-31 NOTE — Progress Notes (Signed)
    Procedures performed today:    Procedure: Real-time Ultrasound Guided gadolinium contrast injection of right glenohumeral joint Device: Samsung HS60  Verbal informed consent obtained.  Time-out conducted.  Noted no overlying erythema, induration, or other signs of local infection.  Skin prepped in a sterile fashion.  Local anesthesia: Topical Ethyl chloride.  With sterile technique and under real time ultrasound guidance: Noted slight synovitis, 22-gauge spinal needle advanced to the glenohumeral joint from a posterior approach, I injected 1 cc kenalog 40, 2 cc lidocaine, 2 cc bupivacaine, syringe was switched and 0.1 cc gadolinium injected, switched again and 10 cc sterile saline used to fully distend the joint. Joint visualized and capsule seen distending confirming intra-articular placement of contrast material and medication. Completed without difficulty  Advised to call if fevers/chills, erythema, induration, drainage, or persistent bleeding.  Images permanently stored in PACS Impression: Technically successful ultrasound guided gadolinium contrast injection for MR arthrography.  Please see separate MR arthrogram report.  Procedure: Real-time Ultrasound Guided gadolinium contrast injection of left hip joint Device: Samsung HS60  Verbal informed consent obtained.  Time-out conducted.  Noted no overlying erythema, induration, or other signs of local infection.  Skin prepped in a sterile fashion.  Local anesthesia: Topical Ethyl chloride.  With sterile technique and under real time ultrasound guidance: Noted slight synovitis, 22-gauge spinal needle advanced to the Femoral head/neck junction, I injected 1 cc kenalog 40, 2 cc lidocaine, 2 cc bupivacaine, syringe was switched and 0.1 cc gadolinium injected, switched again and 10 cc sterile saline used to fully distend the joint. Joint visualized and capsule seen distending confirming intra-articular placement of contrast material and  medication. Completed without difficulty  Advised to call if fevers/chills, erythema, induration, drainage, or persistent bleeding.  Images permanently stored in PACS Impression: Technically successful ultrasound guided gadolinium contrast injection for MR arthrography.  Please see separate MR arthrogram report.  Independent interpretation of notes and tests performed by another provider:   None.  Brief History, Exam, Impression, and Recommendations:    Left thigh pain Zachary Little continues to have left anterior thigh pain, he has significant loss of internal rotation with reproduction of the left anterior thigh pain, he had a pelvic MRI in 2021 did not give really a clear view of the hip. He has thus been treated for greater than 6 weeks. My concern is for osteoarthritis, labral tearing and hip impingement syndrome. Proceeding with hip MRI arthrogram.  Update: Injection performed today for MR arthrography  Chronic right shoulder pain Zachary Little has also had several months of right shoulder pain, he localizes the pain anteriorly at the joint line, on exam he has a positive O'Brien's test, other testing is normal, he has failed greater than 6 weeks of physician directed conservative treatment, proceeding with x-rays and then MR arthrography of the right shoulder. I do suspect we will need to do the arthrogram injections for his hip and shoulder on separate days.  Update: Injection performed today for MR arthrography    ____________________________________________ Zachary Little. Zachary Little, M.D., ABFM., CAQSM., AME. Primary Care and Sports Medicine [place] [facility]  Adjunct Professor of Family Medicine  [place] of [facility] of Medicine  Restaurant manager, fast food

## 2023-03-31 NOTE — Assessment & Plan Note (Signed)
Lomax continues to have left anterior thigh pain, he has significant loss of internal rotation with reproduction of the left anterior thigh pain, he had a pelvic MRI in 2021 did not give really a clear view of the hip. He has thus been treated for greater than 6 weeks. My concern is for osteoarthritis, labral tearing and hip impingement syndrome. Proceeding with hip MRI arthrogram.  Update: Injection performed today for MR arthrography

## 2023-03-31 NOTE — Assessment & Plan Note (Signed)
Zachary Little has also had several months of right shoulder pain, he localizes the pain anteriorly at the joint line, on exam he has a positive O'Brien's test, other testing is normal, he has failed greater than 6 weeks of physician directed conservative treatment, proceeding with x-rays and then MR arthrography of the right shoulder. I do suspect we will need to do the arthrogram injections for his hip and shoulder on separate days.  Update: Injection performed today for MR arthrography

## 2023-04-01 ENCOUNTER — Ambulatory Visit: Payer: 59 | Admitting: Sports Medicine

## 2023-04-08 ENCOUNTER — Ambulatory Visit (INDEPENDENT_AMBULATORY_CARE_PROVIDER_SITE_OTHER): Payer: 59 | Admitting: Sports Medicine

## 2023-04-08 DIAGNOSIS — M792 Neuralgia and neuritis, unspecified: Secondary | ICD-10-CM | POA: Diagnosis not present

## 2023-04-08 DIAGNOSIS — M25511 Pain in right shoulder: Secondary | ICD-10-CM | POA: Diagnosis not present

## 2023-04-08 DIAGNOSIS — S73192D Other sprain of left hip, subsequent encounter: Secondary | ICD-10-CM

## 2023-04-08 DIAGNOSIS — G8929 Other chronic pain: Secondary | ICD-10-CM | POA: Diagnosis not present

## 2023-04-08 NOTE — Assessment & Plan Note (Signed)
Qui also continues to have chronic abnormal sensation with numbness and tingling right anterior/lateral thigh worse at night. He did have a nerve conduction study that was normal, a negative rheumatoid workup. One of my partners did a lateral femoral cutaneous nerve block which seem to provide some relief for about a day. MR pelvis and MR lumbar spine were both done, he did have some borderline right L3 and right L4 nerve root impingement but nothing obvious on the lumbar spine MRI and nothing traversing past the knee. Right L3 and right L4 transforaminal epidurals did not provide significant relief. At this point he is interested in a surgical consultation, I would like Dr. Yetta Barre to weigh in regarding potential lateral femoral cutaneous nerve decompression, I am however not entirely certain as to whether or not this is meralgia paresthetica or radicular.Marland Kitchen

## 2023-04-08 NOTE — Assessment & Plan Note (Signed)
After years of left anterior thigh pain Zachary Little came to me, we suspected a labral tear, he had a pelvic MRI back in 2021 that really did not give a clear view of the hip, we did an MR arthrogram with steroid, this confirmed labral tearing and hip impingement syndrome, the steroid has improved his symptoms to the point where he is happy with how things are going, however if he does have recurrence I would like him to touch base with Dr. Merton Border for discussion of hip arthroscopy.

## 2023-04-08 NOTE — Assessment & Plan Note (Signed)
Zachary Little had had several months of right shoulder pain, localized to the pain anteriorly over the joint line, ultimately after failure of conservative treatment we did MR arthrography with steroid, arthrogram did confirm a labral tear but the steroid seems to have improved his shoulder pain, we can watch this for now.

## 2023-04-08 NOTE — Progress Notes (Signed)
    Procedures performed today:    None.  Independent interpretation of notes and tests performed by another provider:   None.  Brief History, Exam, Impression, and Recommendations:    Chronic right shoulder pain Zachary Little had had several months of right shoulder pain, localized to the pain anteriorly over the joint line, ultimately after failure of conservative treatment we did MR arthrography with steroid, arthrogram did confirm a labral tear but the steroid seems to have improved his shoulder pain, we can watch this for now.  Labral tear of left hip joint After years of left anterior thigh pain Zachary Little came to me, we suspected a labral tear, he had a pelvic MRI back in 2021 that really did not give a clear view of the hip, we did an MR arthrogram with steroid, this confirmed labral tearing and hip impingement syndrome, the steroid has improved his symptoms to the point where he is happy with how things are going, however if he does have recurrence I would like him to touch base with Dr. Merton Border for discussion of hip arthroscopy.  Neuropathic pain of thigh, right Zachary Little also continues to have chronic abnormal sensation with numbness and tingling right anterior/lateral thigh worse at night. He did have a nerve conduction study that was normal, a negative rheumatoid workup. One of my partners did a lateral femoral cutaneous nerve block which seem to provide some relief for about a day. MR pelvis and MR lumbar spine were both done, he did have some borderline right L3 and right L4 nerve root impingement but nothing obvious on the lumbar spine MRI and nothing traversing past the knee. Right L3 and right L4 transforaminal epidurals did not provide significant relief. At this point he is interested in a surgical consultation, I would like Dr. Yetta Barre to weigh in regarding potential lateral femoral cutaneous nerve decompression, I am however not entirely certain as to whether or not this is meralgia  paresthetica or radicular..    ____________________________________________ Zachary Little. Zachary Little, M.D., ABFM., CAQSM., AME. Primary Care and Sports Medicine [place] [facility]  Adjunct Professor of Family Medicine  [place] of [facility] of Medicine  Restaurant manager, fast food

## 2023-04-22 DIAGNOSIS — Z6829 Body mass index (BMI) 29.0-29.9, adult: Secondary | ICD-10-CM | POA: Diagnosis not present

## 2023-04-22 DIAGNOSIS — G5711 Meralgia paresthetica, right lower limb: Secondary | ICD-10-CM | POA: Diagnosis not present

## 2023-04-29 ENCOUNTER — Ambulatory Visit (INDEPENDENT_AMBULATORY_CARE_PROVIDER_SITE_OTHER): Payer: 59 | Admitting: Sports Medicine

## 2023-04-29 DIAGNOSIS — M792 Neuralgia and neuritis, unspecified: Secondary | ICD-10-CM | POA: Diagnosis not present

## 2023-04-29 DIAGNOSIS — S73192D Other sprain of left hip, subsequent encounter: Secondary | ICD-10-CM

## 2023-04-29 NOTE — Progress Notes (Signed)
    Procedures performed today:    None.  Independent interpretation of notes and tests performed by another provider:   None.  Brief History, Exam, Impression, and Recommendations:    Neuropathic pain of thigh, right [name], he is a very pleasant 42 year old male, chronic abnormal sensation with numbness, tingling right anterior/lateral thigh worse at night. He had a negative rheumatoid workup, nerve conduction and EMG that was normal, MRI pelvis and MR lumbar spine were both done, there were some borderline right L3 and right L4 nerve root impingement but nothing else obvious, right L3 and right L4 transforaminal epidurals did not provide significant relief. He had a lateral femoral cutaneous nerve block with one of my partners that seemed to help for about a day or so. We did get neurosurgical consultation with Dr. Yetta Barre, [facility] does not have experience and lateral femoral cutaneous nerve release and they offered referral to [facility]. At this point Zachary Little is interested in another lateral femoral cutaneous nerve hydrodissection with ultrasound guidance which I can perform at his leisure.  He is not interested in additional consultation at [facility] at this juncture.   Labral tear of left hip joint Right also has had years of left anterior thigh pain, he had a pelvic MRI in 2021 that did not give a clear view of the hip, we suspected labral tear, ultimately I performed an MR arthrography with steroid that confirmed labral tearing, anatomy consistent with hip impingement syndrome, the steroid injection did improve his symptoms significantly, and he is for the most part pain-free right now, he is concerned that the pain will return when he starts to run again.  We did offer consultation with Dr. Merton Border for discussion of hip arthroscopy, [name] would like to hold off on this for now, and I agree, I think he would need to prove that his pain recurs before we consider any surgical  intervention.    ____________________________________________ Ihor Austin. Benjamin Stain, M.D., ABFM., CAQSM., AME. Primary Care and Sports Medicine [place] [facility]  Adjunct Professor of Family Medicine  [place] of [facility] of Medicine  Restaurant manager, fast food

## 2023-04-29 NOTE — Assessment & Plan Note (Signed)
Zachary Little also has had years of left anterior thigh pain, he had a pelvic MRI in 2021 that did not give a clear view of the hip, we suspected labral tear, ultimately I performed an MR arthrography with steroid that confirmed labral tearing, anatomy consistent with hip impingement syndrome, the steroid injection did improve his symptoms significantly, and he is for the most part pain-free Zachary Little now, he is concerned that the pain will return when he starts to run again.  We did offer consultation with Dr. Merton Border for discussion of hip arthroscopy, Khayree would like to hold off on this for now, and I agree, I think he would need to prove that his pain recurs before we consider any surgical intervention.

## 2023-04-29 NOTE — Assessment & Plan Note (Signed)
Zachary Little returns, he is a very pleasant 41 year old male, chronic abnormal sensation with numbness, tingling right anterior/lateral thigh worse at night. He had a negative rheumatoid workup, nerve conduction and EMG that was normal, MRI pelvis and MR lumbar spine were both done, there were some borderline right L3 and right L4 nerve root impingement but nothing else obvious, right L3 and right L4 transforaminal epidurals did not provide significant relief. He had a lateral femoral cutaneous nerve block with one of my partners that seemed to help for about a day or so. We did get neurosurgical consultation with Dr. Yetta Barre, [facility] does not have experience and lateral femoral cutaneous nerve release and they offered referral to [facility]. At this point Zachary Little is interested in another lateral femoral cutaneous nerve hydrodissection with ultrasound guidance which I can perform at his leisure.  He is not interested in additional consultation at [facility] at this juncture.

## 2023-05-06 DIAGNOSIS — L821 Other seborrheic keratosis: Secondary | ICD-10-CM | POA: Diagnosis not present

## 2023-05-06 DIAGNOSIS — D1801 Hemangioma of skin and subcutaneous tissue: Secondary | ICD-10-CM | POA: Diagnosis not present

## 2023-05-06 DIAGNOSIS — D2262 Melanocytic nevi of left upper limb, including shoulder: Secondary | ICD-10-CM | POA: Diagnosis not present

## 2023-05-06 DIAGNOSIS — D2272 Melanocytic nevi of left lower limb, including hip: Secondary | ICD-10-CM | POA: Diagnosis not present

## 2023-05-06 DIAGNOSIS — D2261 Melanocytic nevi of right upper limb, including shoulder: Secondary | ICD-10-CM | POA: Diagnosis not present

## 2023-05-06 DIAGNOSIS — D2271 Melanocytic nevi of right lower limb, including hip: Secondary | ICD-10-CM | POA: Diagnosis not present

## 2023-05-06 DIAGNOSIS — D224 Melanocytic nevi of scalp and neck: Secondary | ICD-10-CM | POA: Diagnosis not present

## 2023-05-06 DIAGNOSIS — D2239 Melanocytic nevi of other parts of face: Secondary | ICD-10-CM | POA: Diagnosis not present

## 2023-05-06 DIAGNOSIS — L820 Inflamed seborrheic keratosis: Secondary | ICD-10-CM | POA: Diagnosis not present

## 2023-05-06 DIAGNOSIS — D225 Melanocytic nevi of trunk: Secondary | ICD-10-CM | POA: Diagnosis not present

## 2023-05-28 ENCOUNTER — Other Ambulatory Visit: Payer: Self-pay | Admitting: Physician Assistant

## 2023-05-28 ENCOUNTER — Other Ambulatory Visit (HOSPITAL_BASED_OUTPATIENT_CLINIC_OR_DEPARTMENT_OTHER): Payer: Self-pay

## 2023-05-28 MED ORDER — DIAZEPAM 5 MG PO TABS
5.0000 mg | ORAL_TABLET | Freq: Two times a day (BID) | ORAL | 0 refills | Status: DC | PRN
  Filled 2023-05-28: qty 30, 15d supply, fill #0

## 2023-05-28 MED ORDER — LEVOTHYROXINE SODIUM 150 MCG PO TABS
150.0000 ug | ORAL_TABLET | Freq: Every day | ORAL | 1 refills | Status: DC
  Filled 2023-05-28: qty 90, 90d supply, fill #0
  Filled 2023-08-27: qty 90, 90d supply, fill #1

## 2023-05-28 NOTE — Telephone Encounter (Signed)
Pt requesting refill for Valium 5 mg. Last OV 09/25/2022.

## 2023-06-11 ENCOUNTER — Encounter: Payer: Self-pay | Admitting: Orthopaedic Surgery

## 2023-06-11 ENCOUNTER — Ambulatory Visit: Payer: 59 | Admitting: Orthopaedic Surgery

## 2023-06-11 DIAGNOSIS — G894 Chronic pain syndrome: Secondary | ICD-10-CM | POA: Diagnosis not present

## 2023-06-11 DIAGNOSIS — M79604 Pain in right leg: Secondary | ICD-10-CM | POA: Insufficient documentation

## 2023-06-11 DIAGNOSIS — M79605 Pain in left leg: Secondary | ICD-10-CM | POA: Diagnosis not present

## 2023-06-11 NOTE — Progress Notes (Signed)
Office Visit Note   Patient: Zachary Little           Date of Birth: [birth year]           MRN: [id number] Visit Date: 06/11/2023              Requested by: Jarold Motto, [place] [address] [place],  [place] [address] PCP: Jarold Motto, [place]   Assessment & Plan: Visit Diagnoses:  1. Pain in left leg   2. Pain in right leg   3. Chronic pain syndrome     Plan: Zachary Little is a 42 year old gentleman with bilateral leg pain.  This seems to be muscular in nature and we talked about various strategies to improve the pain.  We talked about graded exercise.  We talked about proper way of hydration and replenishing electrolytes and carbohydrates.  Sounds like he was checked out by rheumatologist few years ago but we did consider PMR.  Could have a component of neuropathic pain from previous chemotherapy for leukemia.  Overall reassurance was provided that I do not feel that he has an orthopedic surgical problem that would require an operation.  Questions encouraged and answered.  Follow-up as needed.  Follow-Up Instructions: No follow-ups on file.   Orders:  No orders of the defined types were placed in this encounter.  No orders of the defined types were placed in this encounter.     Procedures: No procedures performed   Clinical Data: No additional findings.   Subjective: Chief Complaint  Patient presents with   Left Leg - Pain   Right Leg - Pain    HPI Zachary Little is a 42 year old gentleman who comes in for evaluation of bilateral leg pain for 2 months.  He has tenderness on the inner thighs.  He has pain usually the following day or 2 after activity.  He states that his knees are sensitive to touch when he sleeps at night.  He has reported no significant improvement in his symptoms from back ESI's.  Review of Systems  Constitutional: Negative.   HENT: Negative.    Eyes: Negative.   Respiratory: Negative.    Cardiovascular: Negative.   Gastrointestinal: Negative.    Endocrine: Negative.   Genitourinary: Negative.   Skin: Negative.   Allergic/Immunologic: Negative.   Neurological: Negative.   Hematological: Negative.   Psychiatric/Behavioral: Negative.    All other systems reviewed and are negative.    Objective: Vital Signs: There were no vitals taken for this visit.  Physical Exam Vitals and nursing note reviewed.  Constitutional:      Appearance: He is well-developed.  HENT:     Head: Normocephalic and atraumatic.  Eyes:     Pupils: Pupils are equal, round, and reactive to light.  Pulmonary:     Effort: Pulmonary effort is normal.  Abdominal:     Palpations: Abdomen is soft.  Musculoskeletal:        General: Normal range of motion.     Cervical back: Neck supple.  Skin:    General: Skin is warm.  Neurological:     Mental Status: He is alert and oriented to person, place, and time.  Psychiatric:        Behavior: Behavior normal.        Thought Content: Thought content normal.        Judgment: Judgment normal.     Ortho Exam Examination of his hips and knees are nonfocal.  He has fluid range of motion without pain. Specialty  Comments:  No specialty comments available.  Imaging: No results found.   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 C6 foraminal stenosis with left-sided periscapular discomfort 04/23/2022   Pes anserinus bursitis of left knee 03/19/2022   Chronic pain syndrome 12/29/2019   Neuropathic pain of thigh, right 12/29/2019   Chronic right shoulder pain 10/06/2019   Depression, major, single episode, 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   Erectile dysfunction 03/09/2018   Obesity 03/09/2018   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/02/2018   Knee pain 10/31/2017   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) 05/16/2015   Hypothyroidism (acquired) 05/16/2015   Vitamin B12 deficiency 10/14/2011   Past Medical History:  Diagnosis Date   Anxiety    Blood transfusion without reported diagnosis 2010   During Chemo Treatments   Leukemia, acute myeloid, in remission (HCC) 2010   Thyroid disease     Family History  Problem Relation Age of Onset   Healthy Mother    Hypertension Father    Multiple sclerosis Maternal Grandmother    Dementia Maternal Grandmother    Diabetes Maternal Grandfather    Heart attack Maternal Grandfather 28   Diabetes Paternal Grandfather    Breast cancer Maternal Aunt    Prostate cancer Neg Hx    Colon cancer Neg Hx     Past Surgical History:  Procedure Laterality Date   CYSTECTOMY Left    Scrotum   PORT-A-CATH REMOVAL     WISDOM TOOTH EXTRACTION     Social History   Occupational History   Occupation: Disabled  Tobacco Use   Smoking status: Never   Smokeless tobacco: Never  Vaping Use   Vaping status: Never Used  Substance and Sexual Activity   Alcohol use: Yes    Comment: "maybe one drink every two years"   Drug use: No   Sexual activity: Yes    Partners: Female

## 2023-07-01 ENCOUNTER — Ambulatory Visit (INDEPENDENT_AMBULATORY_CARE_PROVIDER_SITE_OTHER): Payer: 59 | Admitting: Sports Medicine

## 2023-07-01 ENCOUNTER — Ambulatory Visit (INDEPENDENT_AMBULATORY_CARE_PROVIDER_SITE_OTHER): Payer: 59

## 2023-07-01 DIAGNOSIS — S43431S Superior glenoid labrum lesion of right shoulder, sequela: Secondary | ICD-10-CM | POA: Diagnosis not present

## 2023-07-01 DIAGNOSIS — S73192D Other sprain of left hip, subsequent encounter: Secondary | ICD-10-CM | POA: Diagnosis not present

## 2023-07-01 DIAGNOSIS — M25551 Pain in right hip: Secondary | ICD-10-CM | POA: Diagnosis not present

## 2023-07-01 DIAGNOSIS — M792 Neuralgia and neuritis, unspecified: Secondary | ICD-10-CM

## 2023-07-01 NOTE — Assessment & Plan Note (Signed)
Zachary Little also has a history of years of left anterior thigh pain, worse with internal rotation, he had a pelvic MRI in 2021 that did not give a clear view of the hip. Ultimately after failure of conservative treatment including physical therapy (I am going to give him some additional hip girdle and abductor conditioning to do) we obtained MR arthrography that confirmed a labral tearing, he also had anatomy consistent with hip impingement syndrome. In June I offered consultation with Dr. Steward Drone for discussion of hip arthroscopy, Zachary Little wanted to hold off but he is now ready to consult.

## 2023-07-01 NOTE — Assessment & Plan Note (Signed)
With regards to Zachary Little's right hip, he has had years of discomfort anterolateral right hip, thigh with radiation to the knee.  He describes abnormal sensations such as numbness, tingling of the right anterior/lateral thigh worse at night. He had a negative rheumatoid workup, he had a nerve conduction and EMG that was normal, MRI pelvis and MRI lumbar spine were both done, there was borderline right L3 and right L4 nerve root impingement but nothing else obvious, we did diagnostic and therapeutic right L3 and right L4 transforaminal epidurals that did not provide significant relief. He had a lateral femoral cutaneous nerve block with one of my partners that seem to help for a day or so, but this was not definitive. We got neurosurgical consultation with Dr. Yetta Barre with [facility], he did not have the experience with lateral femoral cutaneous nerve release and offered referral to [facility]. Considering the labral tear in his left hip, before we go any further with his right hip I do think we should proceed with MR arthrography to ensure that were not missing a labral tear (the horse) rather than meralgia paresthetica (the zebra). We will get him teed up with an x-ray, and he will let me know when he is ready to proceed with MR arthrography.

## 2023-07-01 NOTE — Progress Notes (Signed)
    Procedures performed today:    None.  Independent interpretation of notes and tests performed by another provider:   None.  Brief History, Exam, Impression, and Recommendations:    Labral tear of shoulder, right, sequela Khaliq returns, he is a very pleasant 42 year old male, he has had chronic right shoulder pain, he failed conservative treatment in the form of physical therapy, medications, activity modification, ultimately we obtained MR arthrography with steroid, arthrogram did confirm a labral tear but the steroid improved his shoulder pain, we opted to watch it. Now approximately 3 months later he is having recurrence of shoulder pain on the right, I do think we need Dr. Everardo Pacific to weigh in at this point.  Labral tear of left hip joint Genelle Bal also has a history of years of left anterior thigh pain, worse with internal rotation, he had a pelvic MRI in 2021 that did not give a clear view of the hip. Ultimately after failure of conservative treatment including physical therapy (I am going to give him some additional hip girdle and abductor conditioning to do) we obtained MR arthrography that confirmed a labral tearing, he also had anatomy consistent with hip impingement syndrome. In June I offered consultation with Dr. Steward Drone for discussion of hip arthroscopy, Nicodemus wanted to hold off but he is now ready to consult.  Neuropathic pain of thigh, right With regards to Joffre's right hip, he has had years of discomfort anterolateral right hip, thigh with radiation to the knee.  He describes abnormal sensations such as numbness, tingling of the right anterior/lateral thigh worse at night. He had a negative rheumatoid workup, he had a nerve conduction and EMG that was normal, MRI pelvis and MRI lumbar spine were both done, there was borderline right L3 and right L4 nerve root impingement but nothing else obvious, we did diagnostic and therapeutic right L3 and right L4 transforaminal epidurals that  did not provide significant relief. He had a lateral femoral cutaneous nerve block with one of my partners that seem to help for a day or so, but this was not definitive. We got neurosurgical consultation with Dr. Yetta Barre with [facility], he did not have the experience with lateral femoral cutaneous nerve release and offered referral to [facility]. Considering the labral tear in his left hip, before we go any further with his right hip I do think we should proceed with MR arthrography to ensure that were not missing a labral tear (the horse) rather than meralgia paresthetica (the zebra). We will get him teed up with an x-ray, and he will let me know when he is ready to proceed with MR arthrography.  I spent 30 minutes of total time managing this patient today, this includes chart review, face to face, and non-face to face time.  ____________________________________________ Ihor Austin. Benjamin Stain, M.D., ABFM., CAQSM., AME. Primary Care and Sports Medicine [facility]  Adjunct Professor of Family Medicine  [employer]  Restaurant manager, fast food

## 2023-07-01 NOTE — Assessment & Plan Note (Signed)
Zachary Little returns, he is a very pleasant 42 year old male, he has had chronic right shoulder pain, he failed conservative treatment in the form of physical therapy, medications, activity modification, ultimately we obtained MR arthrography with steroid, arthrogram did confirm a labral tear but the steroid improved his shoulder pain, we opted to watch it. Now approximately 3 months later he is having recurrence of shoulder pain on the right, I do think we need Dr. Everardo Pacific to weigh in at this point.

## 2023-07-04 ENCOUNTER — Ambulatory Visit (HOSPITAL_BASED_OUTPATIENT_CLINIC_OR_DEPARTMENT_OTHER): Payer: 59 | Admitting: Orthopaedic Surgery

## 2023-07-04 DIAGNOSIS — M25859 Other specified joint disorders, unspecified hip: Secondary | ICD-10-CM

## 2023-07-04 NOTE — Progress Notes (Signed)
Chief Complaint: Left hip pain     History of Present Illness:    Zachary Little is a 42 y.o. male presents today with ongoing left hip and thigh pain which has been persistently painful for the last several years.  He states that he does enjoy running for exercise although he has been unable to do this now as result of his hip and groin pain.  He does enjoy walking as well although this is essentially come to a stop as a result of his pain.  He has been taking ibuprofen.  He is attempted to dial back activity wise although this has been very difficult as he does enjoy walking and running.  He is here today for further discussion.  He has been seeing Dr. Karie Schwalbe who obtained an MRI showing evidence of a left hip labral tear    Surgical History:   None  PMH/PSH/Family History/Social History/Meds/Allergies:    Past Medical History:  Diagnosis Date   Anxiety    Blood transfusion without reported diagnosis 2010   During Chemo Treatments   Leukemia, acute myeloid, in remission (HCC) 2010   Thyroid disease    Past Surgical History:  Procedure Laterality Date   CYSTECTOMY Left    Scrotum   PORT-A-CATH REMOVAL     WISDOM TOOTH EXTRACTION     Social History   Socioeconomic History   Marital status: Single    Spouse name: Not on file   Number of children: 0   Years of education: college   Highest education level: Bachelor's degree (e.g., BA, AB, BS)  Occupational History   Occupation: Disabled  Tobacco Use   Smoking status: Never   Smokeless tobacco: Never  Vaping Use   Vaping status: Never Used  Substance and Sexual Activity   Alcohol use: Yes    Comment: "maybe one drink every two years"   Drug use: No   Sexual activity: Yes    Partners: Female  Other Topics Concern   Not on file  Social History Narrative   Lives at home with his wife.   Left-handed.   No daily caffeine use.    Social Determinants of Health   Financial Resource  Strain: Low Risk  (02/14/2023)   Overall Financial Resource Strain (CARDIA)    Difficulty of Paying Living Expenses: Not hard at all  Food Insecurity: No Food Insecurity (02/14/2023)   Hunger Vital Sign    Worried About Running Out of Food in the Last Year: Never true    Ran Out of Food in the Last Year: Never true  Transportation Needs: No Transportation Needs (02/14/2023)   PRAPARE - Administrator, Civil Service (Medical): No    Lack of Transportation (Non-Medical): No  Physical Activity: Insufficiently Active (02/14/2023)   Exercise Vital Sign    Days of Exercise per Week: 3 days    Minutes of Exercise per Session: 30 min  Stress: No Stress Concern Present (02/14/2023)   [employer] of Occupational Health - Occupational Stress Questionnaire    Feeling of Stress : Only a little  Social Connections: Unknown (02/14/2023)   Social Connection and Isolation Panel [NHANES]    Frequency of Communication with Friends and Family: Once a week    Frequency of Social Gatherings with Friends and Family: Patient declined  Attends Religious Services: Never    Active Member of Clubs or Organizations: No    Attends Engineer, structural: Not on file    Marital Status: Married   Family History  Problem Relation Age of Onset   Healthy Mother    Hypertension Father    Multiple sclerosis Maternal Grandmother    Dementia Maternal Grandmother    Diabetes Maternal Grandfather    Heart attack Maternal Grandfather 65   Diabetes Paternal Grandfather    Breast cancer Maternal Aunt    Prostate cancer Neg Hx    Colon cancer Neg Hx    Allergies  Allergen Reactions   Topamax [Topiramate] Other (See Comments)    Anger/irritable   Trazodone And Nefazodone Nausea Only   Current Outpatient Medications  Medication Sig Dispense Refill   diazepam (VALIUM) 5 MG tablet Take 1 tablet (5 mg total) by mouth every 12 (twelve) hours as needed for muscle spasms 30 tablet 0   levothyroxine  (SYNTHROID) 150 MCG tablet Take 1 tablet (150 mcg total) by mouth daily. 90 tablet 1   No current facility-administered medications for this visit.   No results found.  Review of Systems:   A ROS was performed including pertinent positives and negatives as documented in the HPI.  Physical Exam :   Constitutional: NAD and appears stated age Neurological: Alert and oriented Psych: Appropriate affect and cooperative There were no vitals taken for this visit.   Comprehensive Musculoskeletal Exam:    Inspection Right Left  Skin No atrophy or gross abnormalities appreciated No atrophy or gross abnormalities appreciated  Palpation    Tenderness None None  Crepitus None None  Range of Motion    Flexion (passive) 120 120  Extension 30 30  IR 30 30 with pain  ER 45 45  Strength    Flexion  5/5 5/5  Extension 5/5 5/5  Special Tests    FABER Negative Negative  FADIR Negative Positive  ER Lag/Capsular Insufficiency Negative Negative  Instability Negative Negative  Sacroiliac pain Negative  Negative   Instability    Generalized Laxity No No  Neurologic    sciatic, femoral, obturator nerves intact to light sensation  Vascular/Lymphatic    DP pulse 2+ 2+  Lumbar Exam    Patient has symmetric lumbar range of motion with negative pain referral to hip     Imaging:   Xray (3 views left hip): 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   Assessment:   42 y.o. male with evidence of anterior superior labral tearing in the setting of femoral acetabular impingement and cam lesion.  Overall I did describe that given that this is limiting his ability to remain active doing jogging and walking that I would ultimately recommend an ultrasound-guided injection of the left hip.  I would like him to go jogging tomorrow  after this is fully kicked in to see how much pain relief he gets.  I do ultimately believe that this is the source of his underlying hip and thigh pain.  I would like him to specifically see how much relief from his thigh pain he gets after this diagnostic ultrasound-guided injection.  I will plan to see him back in 2 weeks for reassessment  Plan :    -Return to clinic in 2 weeks for reassessment -Left hip ultrasound-guided injection  performed and verbal consent obtained     I personally saw and evaluated the patient, and participated in the management and treatment plan.  Huel Cote, MD Attending Physician, Orthopedic Surgery  This document was dictated using Dragon voice recognition software. A reasonable attempt at proof reading has been made to minimize errors.

## 2023-07-08 DIAGNOSIS — S43431A Superior glenoid labrum lesion of right shoulder, initial encounter: Secondary | ICD-10-CM | POA: Diagnosis not present

## 2023-08-04 ENCOUNTER — Ambulatory Visit (INDEPENDENT_AMBULATORY_CARE_PROVIDER_SITE_OTHER): Payer: 59 | Admitting: Orthopaedic Surgery

## 2023-08-04 DIAGNOSIS — M7631 Iliotibial band syndrome, right leg: Secondary | ICD-10-CM

## 2023-08-04 DIAGNOSIS — M79604 Pain in right leg: Secondary | ICD-10-CM | POA: Diagnosis not present

## 2023-08-04 MED ORDER — TRIAMCINOLONE ACETONIDE 40 MG/ML IJ SUSP
80.0000 mg | INTRAMUSCULAR | Status: AC | PRN
Start: 2023-08-04 — End: 2023-08-04
  Administered 2023-08-04: 80 mg via INTRA_ARTICULAR

## 2023-08-04 MED ORDER — LIDOCAINE HCL 1 % IJ SOLN
4.0000 mL | INTRAMUSCULAR | Status: AC | PRN
Start: 2023-08-04 — End: 2023-08-04
  Administered 2023-08-04: 4 mL

## 2023-08-04 NOTE — Progress Notes (Signed)
Chief Complaint: Left hip pain     History of Present Illness:    08/04/2023: Presents today for follow-up of his left hip.  He has gotten 90+ percent relief after his left hip injection.  He has been able to run now.  He is experiencing some tenderness about the lateral aspect of the IT band on the right.  This has been bothering him more so now that his hip is feeling better.  He is scheduled for right shoulder labral repair with Dr. Everardo Pacific coming up.   Zachary Little is a 42 y.o. male presents today with ongoing left hip and thigh pain which has been persistently painful for the last several years.  He states that he does enjoy running for exercise although he has been unable to do this now as result of his hip and groin pain.  He does enjoy walking as well although this is essentially come to a stop as a result of his pain.  He has been taking ibuprofen.  He is attempted to dial back activity wise although this has been very difficult as he does enjoy walking and running.  He is here today for further discussion.  He has been seeing Dr. Karie Schwalbe who obtained an MRI showing evidence of a left hip labral tear    Surgical History:   None  PMH/PSH/Family History/Social History/Meds/Allergies:    Past Medical History:  Diagnosis Date  . Anxiety   . Blood transfusion without reported diagnosis 2010   During Chemo Treatments  . Leukemia, acute myeloid, in remission (HCC) 2010  . Thyroid disease    Past Surgical History:  Procedure Laterality Date  . CYSTECTOMY Left    Scrotum  . PORT-A-CATH REMOVAL    . WISDOM TOOTH EXTRACTION     Social History   Socioeconomic History  . Marital status: Single    Spouse name: Not on file  . Number of children: 0  . Years of education: college  . Highest education level: Bachelor's degree (e.g., BA, AB, BS)  Occupational History  . Occupation: Disabled  Tobacco Use  . Smoking status: Never  . Smokeless  tobacco: Never  Vaping Use  . Vaping status: Never Used  Substance and Sexual Activity  . Alcohol use: Yes    Comment: "maybe one drink every two years"  . Drug use: No  . Sexual activity: Yes    Partners: Female  Other Topics Concern  . Not on file  Social History Narrative   Lives at home with his wife.   Left-handed.   No daily caffeine use.    Social Determinants of Health   Financial Resource Strain: Low Risk  (02/14/2023)   Overall Financial Resource Strain (CARDIA)   . Difficulty of Paying Living Expenses: Not hard at all  Food Insecurity: No Food Insecurity (02/14/2023)   Hunger Vital Sign   . Worried About Programme researcher, broadcasting/film/video in the Last Year: Never true   . Ran Out of Food in the Last Year: Never true  Transportation Needs: No Transportation Needs (02/14/2023)   PRAPARE - Transportation   . Lack of Transportation (Medical): No   . Lack of Transportation (Non-Medical): No  Physical Activity: Insufficiently Active (02/14/2023)   Exercise Vital Sign   . Days of Exercise per Week: 3 days   .  Minutes of Exercise per Session: 30 min  Stress: No Stress Concern Present (02/14/2023)   [employer] of Occupational Health - Occupational Stress Questionnaire   . Feeling of Stress : Only a little  Social Connections: Unknown (02/14/2023)   Social Connection and Isolation Panel [NHANES]   . Frequency of Communication with Friends and Family: Once a week   . Frequency of Social Gatherings with Friends and Family: Patient declined   . Attends Religious Services: Never   . Active Member of Clubs or Organizations: No   . Attends Banker Meetings: Not on file   . Marital Status: Married   Family History  Problem Relation Age of Onset  . Healthy Mother   . Hypertension Father   . Multiple sclerosis Maternal Grandmother   . Dementia Maternal Grandmother   . Diabetes Maternal Grandfather   . Heart attack Maternal Grandfather 80  . Diabetes Paternal Grandfather    . Breast cancer Maternal Aunt   . Prostate cancer Neg Hx   . Colon cancer Neg Hx    Allergies  Allergen Reactions  . Topamax [Topiramate] Other (See Comments)    Anger/irritable  . Trazodone And Nefazodone Nausea Only   Current Outpatient Medications  Medication Sig Dispense Refill  . diazepam (VALIUM) 5 MG tablet Take 1 tablet (5 mg total) by mouth every 12 (twelve) hours as needed for muscle spasms 30 tablet 0  . levothyroxine (SYNTHROID) 150 MCG tablet Take 1 tablet (150 mcg total) by mouth daily. 90 tablet 1   No current facility-administered medications for this visit.   No results found.  Review of Systems:   A ROS was performed including pertinent positives and negatives as documented in the HPI.  Physical Exam :   Constitutional: NAD and appears stated age Neurological: Alert and oriented Psych: Appropriate affect and cooperative There were no vitals taken for this visit.   Comprehensive Musculoskeletal Exam:    Inspection Right Left  Skin No atrophy or gross abnormalities appreciated No atrophy or gross abnormalities appreciated  Palpation    Tenderness None None  Crepitus None None  Range of Motion    Flexion (passive) 120 120  Extension 30 30  IR 30 30 with pain  ER 45 45  Strength    Flexion  5/5 5/5  Extension 5/5 5/5  Special Tests    FABER Negative Negative  FADIR Negative Positive  ER Lag/Capsular Insufficiency Negative Negative  Instability Negative Negative  Sacroiliac pain Negative  Negative   Instability    Generalized Laxity No No  Neurologic    sciatic, femoral, obturator nerves intact to light sensation  Vascular/Lymphatic    DP pulse 2+ 2+  Lumbar Exam    Patient has symmetric lumbar range of motion with negative pain referral to hip   Tenderness over the IT band laterally there is no snapping as he moves the knee from flexion into extension.  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   Assessment:   42 y.o. male with evidence of anterior superior labral tearing in the setting of femoral acetabular impingement and cam lesion.  At this time he has not had an injection with 90% plus relief doing extremely well from this perspective.  He is  scheduled to have an upcoming right shoulder labral repair done.  I did discuss that ultimately the shoulder would be very important in terms of having it available for rehab in the lower extremity.  Particularly for a period of nonweightbearing for 2 weeks.  He is having some IT band tendinitis on the right which I have offered ultrasound-guided injection which she would like to proceed today.  He will think about the timing of the surgery for the hip and send [place] a MyChart message Plan :    -Right IT band injection provided after verbal consent obtained -Likely plan for left hip arthroscopy with labral repair and cam debridement   After a lengthy discussion of treatment options, including risks, benefits, alternatives, complications of surgical and nonsurgical conservative options, the patient elected surgical repair.   The patient  is aware of the material risks  and complications including, but not limited to injury to adjacent structures, neurovascular injury, infection, numbness, bleeding, implant failure, thermal burns, stiffness, persistent pain, failure to heal, disease transmission from allograft, need for further surgery, dislocation, anesthetic risks, blood clots, risks of death,and others. The probabilities of surgical success and failure discussed with patient given their particular co-morbidities.The time and nature of expected rehabilitation and recovery was discussed.The patient's questions were all answered preoperatively.  No  barriers to understanding were noted. I explained the natural history of the disease process and Rx rationale.  I explained to the patient what I considered to be reasonable expectations given their personal situation.  The final treatment plan was arrived at through a shared patient decision making process model.    Procedure Note  Patient: Zachary Little             Date of Birth: 02-22-1981           MRN: [id number]             Visit Date: 08/04/2023  Procedures: Visit Diagnoses: No diagnosis found.  Large Joint Inj: R knee on 08/04/2023 8:47 AM Indications: pain Details: 22 G 1.5 in needle, ultrasound-guided anterior approach  Arthrogram: No  Medications: 4 mL lidocaine 1 %; 80 mg triamcinolone acetonide 40 MG/ML Outcome: tolerated well, no immediate complications Procedure, treatment alternatives, risks and benefits explained, specific risks discussed. Consent was given by the patient. Immediately prior to procedure a time out was called to verify the correct patient, procedure, equipment, support staff and site/side marked as required. Patient was prepped and draped in the usual sterile fashion.          I personally saw and evaluated the patient, and participated in the management and treatment plan.  Huel Cote, MD Attending Physician, Orthopedic Surgery  This document was dictated using Dragon voice recognition software. A reasonable attempt at proof reading has been made to minimize errors.

## 2023-08-11 ENCOUNTER — Encounter (HOSPITAL_BASED_OUTPATIENT_CLINIC_OR_DEPARTMENT_OTHER): Payer: Self-pay | Admitting: Orthopaedic Surgery

## 2023-08-18 ENCOUNTER — Other Ambulatory Visit (INDEPENDENT_AMBULATORY_CARE_PROVIDER_SITE_OTHER): Payer: 59

## 2023-08-18 ENCOUNTER — Encounter: Payer: Self-pay | Admitting: Sports Medicine

## 2023-08-18 ENCOUNTER — Ambulatory Visit (INDEPENDENT_AMBULATORY_CARE_PROVIDER_SITE_OTHER): Payer: 59 | Admitting: Sports Medicine

## 2023-08-18 DIAGNOSIS — M792 Neuralgia and neuritis, unspecified: Secondary | ICD-10-CM | POA: Diagnosis not present

## 2023-08-18 MED ORDER — TRIAMCINOLONE ACETONIDE 40 MG/ML IJ SUSP
40.0000 mg | Freq: Once | INTRAMUSCULAR | Status: AC
Start: 2023-08-18 — End: 2023-08-18
  Administered 2023-08-18: 40 mg via INTRAMUSCULAR

## 2023-08-18 NOTE — Assessment & Plan Note (Signed)
With regards to Zachary Little's right hip, he has had years of discomfort anterolateral right hip, thigh with radiation to the knee.  He describes abnormal sensations such as numbness, tingling of the right anterior/lateral thigh worse at night. He had a negative rheumatoid workup, he had a nerve conduction and EMG that was normal, MRI pelvis and MRI lumbar spine were both done, there was borderline right L3 and right L4 nerve root impingement but nothing else obvious, we did diagnostic and therapeutic right L3 and right L4 transforaminal epidurals that did not provide significant relief. He had a lateral femoral cutaneous nerve block with one of my partners that seem to help for a day or so, but this was not definitive. We got neurosurgical consultation with Dr. Yetta Barre with [facility], he did not have the experience with lateral femoral cutaneous nerve release and offered referral to [facility]. Considering the labral tear in his left hip, before we go any further with his right hip I do think we should proceed with MR arthrography to ensure that were not missing a labral tear (the horse) rather than meralgia paresthetica (the zebra). We will get him teed up with an x-ray, and he will let me know when he is ready to proceed with MR arthrography.  Update: Today I did a ultrasound-guided hydrodissection of the lateral femoral cutaneous nerve at the level of the anterior superior iliac spine and inguinal ligament. The patient did experience concordant paresthesias after the hydrodissection. Follow-up in 6 weeks, if he does not get persistent relief we will consider referral to [facility] for a peripheral neurosurgeon to consider release.

## 2023-08-18 NOTE — Progress Notes (Signed)
    Procedures performed today:    Procedure: Real-time Ultrasound Guided injection/hydrodissection of the right lateral femoral cutaneous nerve at the level of the ASIS/inguinal ligament Device: Samsung HS60  Verbal informed consent obtained.  Time-out conducted.  Noted no overlying erythema, induration, or other signs of local infection.  Skin prepped in a sterile fashion.  Local anesthesia: Topical Ethyl chloride.  With sterile technique and under real time ultrasound guidance: I visualized the nerve in its typical location, I then advanced a 22-gauge spinal needle to the nerve, I injected medication superficial to and deep to the nerve freeing it from surrounding structures, a total of 1 cc Kenalog 40, 2 cc lidocaine, 2 cc bupivacaine was injected easily Completed without difficulty  The patient immediately experienced concordant paresthesias over the anterolateral right thigh in the distribution of the lateral femoral cutaneous nerve. Advised to call if fevers/chills, erythema, induration, drainage, or persistent bleeding.  Images permanently stored and available for review in PACS.  Impression: Technically successful ultrasound guided injection.  Independent interpretation of notes and tests performed by another provider:   None.  Brief History, Exam, Impression, and Recommendations:    Neuropathic pain of thigh, right With regards to Zachary Little's right hip, he has had years of discomfort anterolateral right hip, thigh with radiation to the knee.  He describes abnormal sensations such as numbness, tingling of the right anterior/lateral thigh worse at night. He had a negative rheumatoid workup, he had a nerve conduction and EMG that was normal, MRI pelvis and MRI lumbar spine were both done, there was borderline right L3 and right L4 nerve root impingement but nothing else obvious, we did diagnostic and therapeutic right L3 and right L4 transforaminal epidurals that did not provide  significant relief. He had a lateral femoral cutaneous nerve block with one of my partners that seem to help for a day or so, but this was not definitive. We got neurosurgical consultation with Dr. Yetta Barre with [facility], he did not have the experience with lateral femoral cutaneous nerve release and offered referral to [facility]. Considering the labral tear in his left hip, before we go any further with his right hip I do think we should proceed with MR arthrography to ensure that were not missing a labral tear (the horse) rather than meralgia paresthetica (the zebra). We will get him teed up with an x-ray, and he will let me know when he is ready to proceed with MR arthrography.  Update: Today I did a ultrasound-guided hydrodissection of the lateral femoral cutaneous nerve at the level of the anterior superior iliac spine and inguinal ligament. The patient did experience concordant paresthesias after the hydrodissection. Follow-up in 6 weeks, if he does not get persistent relief we will consider referral to [facility] for a peripheral neurosurgeon to consider release.    ____________________________________________ Zachary Little. Zachary Little, M.D., ABFM., CAQSM., AME. Primary Care and Sports Medicine [facility]  Adjunct Professor of Family Medicine  [employer]  Restaurant manager, fast food

## 2023-08-18 NOTE — Addendum Note (Signed)
Addended by: Carren Rang A on: 08/18/2023 04:41 PM   Modules accepted: Orders

## 2023-08-20 ENCOUNTER — Other Ambulatory Visit (HOSPITAL_BASED_OUTPATIENT_CLINIC_OR_DEPARTMENT_OTHER): Payer: Self-pay | Admitting: Orthopaedic Surgery

## 2023-08-20 DIAGNOSIS — M25859 Other specified joint disorders, unspecified hip: Secondary | ICD-10-CM

## 2023-08-21 ENCOUNTER — Ambulatory Visit (HOSPITAL_BASED_OUTPATIENT_CLINIC_OR_DEPARTMENT_OTHER): Admit: 2023-08-21 | Payer: 59 | Admitting: Orthopaedic Surgery

## 2023-08-21 ENCOUNTER — Encounter (HOSPITAL_BASED_OUTPATIENT_CLINIC_OR_DEPARTMENT_OTHER): Payer: Self-pay

## 2023-08-21 SURGERY — SHOULDER ARTHROSCOPY WITH SUBACROMIAL DECOMPRESSION AND BICEP TENDON REPAIR
Anesthesia: General | Site: Shoulder | Laterality: Right

## 2023-08-27 ENCOUNTER — Other Ambulatory Visit (HOSPITAL_BASED_OUTPATIENT_CLINIC_OR_DEPARTMENT_OTHER): Payer: Self-pay

## 2023-09-15 ENCOUNTER — Ambulatory Visit: Payer: Self-pay | Admitting: Orthopaedic Surgery

## 2023-09-15 DIAGNOSIS — M25859 Other specified joint disorders, unspecified hip: Secondary | ICD-10-CM

## 2023-09-15 NOTE — Pre-Procedure Instructions (Signed)
Surgical Instructions   Your procedure is scheduled on Monday, November 4th. Report to [facility] Main Entrance "A" at 12:00 P.M., then check in with the Admitting office. Any questions or running late day of surgery: call [phone]  Questions prior to your surgery date: call [phone], Monday-Friday, 8am-4pm. If you experience any cold or flu symptoms such as cough, fever, chills, shortness of breath, etc. between now and your scheduled surgery, please notify us at the above number.     Remember:  Do not eat after midnight the night before your surgery   You may drink clear liquids until 11:00 AM the morning of your surgery.   Clear liquids allowed are: Water, Non-Citrus Juices (without pulp), Carbonated Beverages, Clear Tea, Black Coffee Only (NO MILK, CREAM OR POWDERED CREAMER of any kind), and Gatorade.    Take these medicines the morning of surgery with A SIP OF WATER  levothyroxine (SYNTHROID)    May take these medicines IF NEEDED: diazepam (VALIUM)    One week prior to surgery, STOP taking any Aspirin (unless otherwise instructed by your surgeon) Aleve, Naproxen, Ibuprofen, Motrin, Advil, Goody's, BC's, all herbal medications, fish oil, and non-prescription vitamins.                     Do NOT Smoke (Tobacco/Vaping) for 24 hours prior to your procedure.  If you use a CPAP at night, you may bring your mask/headgear for your overnight stay.   You will be asked to remove any contacts, glasses, piercing's, hearing aid's, dentures/partials prior to surgery. Please bring cases for these items if needed.    Patients discharged the day of surgery will not be allowed to drive home, and someone needs to stay with them for 24 hours.  SURGICAL WAITING ROOM VISITATION Patients may have no more than 2 support people in the waiting area - these visitors may rotate.   Pre-op nurse will coordinate an appropriate time for 1 ADULT support person, who may not rotate, to accompany  patient in pre-op.  Children under the age of 18 must have an adult with them who is not the patient and must remain in the main waiting area with an adult.  If the patient needs to stay at the hospital during part of their recovery, the visitor guidelines for inpatient rooms apply.  Please refer to the [facility] website for the visitor guidelines for any additional information.   If you received a COVID test during your pre-op visit  it is requested that you wear a mask when out in public, stay away from anyone that may not be feeling well and notify your surgeon if you develop symptoms. If you have been in contact with anyone that has tested positive in the last 10 days please notify you surgeon.      Pre-operative CHG Bathing Instructions   You can play a key role in reducing the risk of infection after surgery. Your skin needs to be as free of germs as possible. You can reduce the number of germs on your skin by washing with CHG (chlorhexidine gluconate) soap before surgery. CHG is an antiseptic soap that kills germs and continues to kill germs even after washing.   DO NOT use if you have an allergy to chlorhexidine/CHG or antibacterial soaps. If your skin becomes reddened or irritated, stop using the CHG and notify one of our RNs at [phone].              TAKE A  SHOWER THE NIGHT BEFORE SURGERY AND THE DAY OF SURGERY    Please keep in mind the following:  DO NOT shave, including legs and underarms, 48 hours prior to surgery.   You may shave your face before/day of surgery.  Place clean sheets on your bed the night before surgery Use a clean washcloth (not used since being washed) for each shower. DO NOT sleep with pet's night before surgery.  CHG Shower Instructions:  Wash your face and private area with normal soap. If you choose to wash your hair, wash first with your normal shampoo.  After you use shampoo/soap, rinse your hair and body thoroughly to remove shampoo/soap  residue.  Turn the water OFF and apply half the bottle of CHG soap to a CLEAN washcloth.  Apply CHG soap ONLY FROM YOUR NECK DOWN TO YOUR TOES (washing for 3-5 minutes)  DO NOT use CHG soap on face, private areas, open wounds, or sores.  Pay special attention to the area where your surgery is being performed.  If you are having back surgery, having someone wash your back for you may be helpful. Wait 2 minutes after CHG soap is applied, then you may rinse off the CHG soap.  Pat dry with a clean towel  Put on clean pajamas    Additional instructions for the day of surgery: DO NOT APPLY any lotions, deodorants, cologne, or perfumes.   Do not wear jewelry or makeup Do not wear nail polish, gel polish, artificial nails, or any other type of covering on natural nails (fingers and toes) Do not bring valuables to the hospital. [facility] is not responsible for valuables/personal belongings. Put on clean/comfortable clothes.  Please brush your teeth.  Ask your nurse before applying any prescription medications to the skin.

## 2023-09-16 ENCOUNTER — Inpatient Hospital Stay (HOSPITAL_COMMUNITY)
Admission: RE | Admit: 2023-09-16 | Discharge: 2023-09-16 | Disposition: A | Discharge status: Home / Self Care | Payer: 59 | Source: Ambulatory Visit | Attending: Orthopaedic Surgery

## 2023-09-16 ENCOUNTER — Encounter (HOSPITAL_COMMUNITY): Payer: Self-pay

## 2023-09-16 ENCOUNTER — Other Ambulatory Visit: Payer: Self-pay

## 2023-09-16 VITALS — BP 112/77 | HR 77 | Temp 98.2°F | Resp 17 | Ht 68.0 in | Wt 182.7 lb

## 2023-09-16 DIAGNOSIS — Z01818 Encounter for other preprocedural examination: Secondary | ICD-10-CM | POA: Diagnosis present

## 2023-09-16 DIAGNOSIS — Z01812 Encounter for preprocedural laboratory examination: Secondary | ICD-10-CM | POA: Insufficient documentation

## 2023-09-16 HISTORY — DX: Hypothyroidism, unspecified: E03.9

## 2023-09-16 LAB — CBC
HCT: 47.2 % (ref 39.0–52.0)
Hemoglobin: 16.3 g/dL (ref 13.0–17.0)
MCH: 31.7 pg (ref 26.0–34.0)
MCHC: 34.5 g/dL (ref 30.0–36.0)
MCV: 91.7 fL (ref 80.0–100.0)
Platelets: 182 10*3/uL (ref 150–400)
RBC: 5.15 MIL/uL (ref 4.22–5.81)
RDW: 12.8 % (ref 11.5–15.5)
WBC: 7.2 10*3/uL (ref 4.0–10.5)
nRBC: 0 % (ref 0.0–0.2)

## 2023-09-16 NOTE — Pre-Procedure Instructions (Signed)
Surgical Instructions     Your procedure is scheduled on Monday, November 4th. Report to [facility] Main Entrance "A" at 12:00 P.M., then check in with the Admitting office. Any questions or running late day of surgery: call [phone]   Questions prior to your surgery date: call [phone], Monday-Friday, 8am-4pm. If you experience any cold or flu symptoms such as cough, fever, chills, shortness of breath, etc. between now and your scheduled surgery, please notify us at the above number.            Remember:       Do not eat after midnight the night before your surgery     You may drink clear liquids until 11:00 AM the morning of your surgery.   Clear liquids allowed are: Water, Non-Citrus Juices (without pulp), Carbonated Beverages, Clear Tea, Black Coffee Only (NO MILK, CREAM OR POWDERED CREAMER of any kind), and Gatorade.  Patient Instructions  The night before surgery:  No food after midnight. ONLY clear liquids after midnight  The day of surgery (if you do NOT have diabetes):  Drink ONE (1) Pre-Surgery Clear Ensure by 11 AM  the morning of surgery. Drink in one sitting. Do not sip.  This drink was given to you during your hospital  pre-op appointment visit.  Nothing else to drink after completing the  Pre-Surgery Clear Ensure.          If you have questions, please contact your surgeon's office.           Take these medicines the morning of surgery with A SIP OF WATER  levothyroxine (SYNTHROID)      May take these medicines IF NEEDED: diazepam (VALIUM)      One week prior to surgery, STOP taking any Aspirin (unless otherwise instructed by your surgeon) Aleve, Naproxen, Ibuprofen, Motrin, Advil, Goody's, BC's, all herbal medications, fish oil, and non-prescription vitamins.                     Do NOT Smoke (Tobacco/Vaping) for 24 hours prior to your procedure.   If you use a CPAP at night, you may bring your mask/headgear for your overnight stay.   You will  be asked to remove any contacts, glasses, piercing's, hearing aid's, dentures/partials prior to surgery. Please bring cases for these items if needed.    Patients discharged the day of surgery will not be allowed to drive home, and someone needs to stay with them for 24 hours.   SURGICAL WAITING ROOM VISITATION Patients may have no more than 2 support people in the waiting area - these visitors may rotate.   Pre-op nurse will coordinate an appropriate time for 1 ADULT support person, who may not rotate, to accompany patient in pre-op.  Children under the age of 84 must have an adult with them who is not the patient and must remain in the main waiting area with an adult.   If the patient needs to stay at the hospital during part of their recovery, the visitor guidelines for inpatient rooms apply.   Please refer to the [facility] website for the visitor guidelines for any additional information.     If you received a COVID test during your pre-op visit  it is requested that you wear a mask when out in public, stay away from anyone that may not be feeling well and notify your surgeon if you develop symptoms. If you have been in contact with anyone that has tested positive  in the last 10 days please notify you surgeon.         Pre-operative CHG Bathing Instructions    You can play a key role in reducing the risk of infection after surgery. Your skin needs to be as free of germs as possible. You can reduce the number of germs on your skin by washing with CHG (chlorhexidine gluconate) soap before surgery. CHG is an antiseptic soap that kills germs and continues to kill germs even after washing.    DO NOT use if you have an allergy to chlorhexidine/CHG or antibacterial soaps. If your skin becomes reddened or irritated, stop using the CHG and notify one of our RNs at [phone].               TAKE A SHOWER THE NIGHT BEFORE SURGERY AND THE DAY OF SURGERY     Please keep in mind the following:   DO NOT shave, including legs and underarms, 48 hours prior to surgery.   You may shave your face before/day of surgery.  Place clean sheets on your bed the night before surgery Use a clean washcloth (not used since being washed) for each shower. DO NOT sleep with pet's night before surgery.   CHG Shower Instructions:  Wash your face and private area with normal soap. If you choose to wash your hair, wash first with your normal shampoo.  After you use shampoo/soap, rinse your hair and body thoroughly to remove shampoo/soap residue.  Turn the water OFF and apply half the bottle of CHG soap to a CLEAN washcloth.  Apply CHG soap ONLY FROM YOUR NECK DOWN TO YOUR TOES (washing for 3-5 minutes)  DO NOT use CHG soap on face, private areas, open wounds, or sores.  Pay special attention to the area where your surgery is being performed.  If you are having back surgery, having someone wash your back for you may be helpful. Wait 2 minutes after CHG soap is applied, then you may rinse off the CHG soap.  Pat dry with a clean towel  Put on clean pajamas     Additional instructions for the day of surgery: DO NOT APPLY any lotions, deodorants, cologne, or perfumes.   Do not wear jewelry or makeup Do not wear nail polish, gel polish, artificial nails, or any other type of covering on natural nails (fingers and toes) Do not bring valuables to the hospital. [facility] is not responsible for valuables/personal belongings. Put on clean/comfortable clothes.  Please brush your teeth.  Ask your nurse before applying any prescription medications to the skin.

## 2023-09-16 NOTE — Progress Notes (Signed)
PCP - Monica Becton, MD  Cardiologist - denies  PPM/ICD - denies Device Orders - n/a Rep Notified - n/a  Chest x-ray - denies EKG - n/a Stress Test - denies ECHO - denies Cardiac Cath - denies  Sleep Study - denies CPAP - n/a  Fasting Blood Sugar - no DM Checks Blood Sugar _____ times a day  Last dose of GLP1 agonist-  n/a GLP1 instructions:   Blood Thinner Instructions: n/a Aspirin Instructions: n/a  ERAS Protcol - yes; till 11 AM PRE-SURGERY Ensure or G2- Ensure  COVID TEST- n/a   Anesthesia review: no  Patient denies shortness of breath, fever, cough and chest pain at PAT appointment   All instructions explained to the patient, with a verbal understanding of the material. Patient agrees to go over the instructions while at home for a better understanding. Patient also instructed to self quarantine after being tested for COVID-19. The opportunity to ask questions was provided.

## 2023-09-23 ENCOUNTER — Encounter: Payer: Self-pay | Admitting: Sports Medicine

## 2023-09-23 ENCOUNTER — Ambulatory Visit (INDEPENDENT_AMBULATORY_CARE_PROVIDER_SITE_OTHER): Payer: 59 | Admitting: Sports Medicine

## 2023-09-23 DIAGNOSIS — M792 Neuralgia and neuritis, unspecified: Secondary | ICD-10-CM

## 2023-09-23 DIAGNOSIS — G8929 Other chronic pain: Secondary | ICD-10-CM | POA: Insufficient documentation

## 2023-09-23 DIAGNOSIS — S73192D Other sprain of left hip, subsequent encounter: Secondary | ICD-10-CM

## 2023-09-23 DIAGNOSIS — M25551 Pain in right hip: Secondary | ICD-10-CM

## 2023-09-23 NOTE — Progress Notes (Signed)
    Procedures performed today:    None.  Independent interpretation of notes and tests performed by another provider:   None.  Brief History, Exam, Impression, and Recommendations:    Neuropathic pain of thigh, right Zachary Little returns, he is a very pleasant 42 year old male, he has had years of discomfort anterolateral right hip and thigh with radiation to the knee. He describes abnormal sensations as numbness, tingling. He had a negative rheumatoid workup, he had a nerve conduction and EMG that was normal, MRI pelvis and MRI lumbar spine were both done, there was borderline right L3 and right L4 nerve root impingement but nothing else obvious on the lumbar spine MRI. Diagnostic and therapeutic right L3 and right L4 transforaminal epidurals did not provide any relief not even temporary. We do consultation with Dr. Yetta Barre with [facility] for consideration of lateral femoral cutaneous nerve release, he did not have experience with this procedure so he suggested referral to [facility]. I performed a ultrasound-guided hydrodissection of the lateral femoral cutaneous nerve in September, he noted about 3 days of good relief and then recurrence of discomfort. We will place this on the back burner for now and further evaluate for right hip labral tear. If additional right hip evaluation is unrevealing then we will consider referral to [facility] neurosurgery for consideration of lateral femoral cutaneous nerve release.  Labral tear of left hip joint Surgery coming up on Monday with Dr. Steward Drone for hip arthroscopy.  Chronic right hip pain Persistent chronic right hip pain for years, we have done many months of physical therapy, x-rays unrevealing, he has a left hip labral tear so I do suspect similar on the right, ordering MR arthrography, he will schedule this once he has recovered enough from his left hip arthroscopy. We will also going get an MRI of his sacroiliac  joints.    ____________________________________________ Zachary Little. Benjamin Stain, M.D., ABFM., CAQSM., AME. Primary Care and Sports Medicine [facility]  Adjunct Professor of Family Medicine  [employer]  Restaurant manager, fast food

## 2023-09-23 NOTE — Assessment & Plan Note (Signed)
Persistent chronic right hip pain for years, we have done many months of physical therapy, x-rays unrevealing, he has a left hip labral tear so I do suspect similar on the right, ordering MR arthrography, he will schedule this once he has recovered enough from his left hip arthroscopy. We will also going get an MRI of his sacroiliac joints.

## 2023-09-23 NOTE — Assessment & Plan Note (Signed)
Zachary Little returns, he is a very pleasant 42 year old male, he has had years of discomfort anterolateral right hip and thigh with radiation to the knee. He describes abnormal sensations as numbness, tingling. He had a negative rheumatoid workup, he had a nerve conduction and EMG that was normal, MRI pelvis and MRI lumbar spine were both done, there was borderline right L3 and right L4 nerve root impingement but nothing else obvious on the lumbar spine MRI. Diagnostic and therapeutic right L3 and right L4 transforaminal epidurals did not provide any relief not even temporary. We do consultation with Dr. Yetta Barre with [facility] for consideration of lateral femoral cutaneous nerve release, he did not have experience with this procedure so he suggested referral to [facility]. I performed a ultrasound-guided hydrodissection of the lateral femoral cutaneous nerve in September, he noted about 3 days of good relief and then recurrence of discomfort. We will place this on the back burner for now and further evaluate for right hip labral tear. If additional right hip evaluation is unrevealing then we will consider referral to [facility] neurosurgery for consideration of lateral femoral cutaneous nerve release.

## 2023-09-23 NOTE — Assessment & Plan Note (Signed)
Surgery coming up on Monday with Dr. Steward Drone for hip arthroscopy.

## 2023-09-29 ENCOUNTER — Other Ambulatory Visit: Payer: Self-pay

## 2023-09-29 ENCOUNTER — Ambulatory Visit (HOSPITAL_BASED_OUTPATIENT_CLINIC_OR_DEPARTMENT_OTHER): Payer: 59 | Admitting: Anesthesiology

## 2023-09-29 ENCOUNTER — Ambulatory Visit (HOSPITAL_COMMUNITY): Payer: Self-pay | Admitting: Anesthesiology

## 2023-09-29 ENCOUNTER — Other Ambulatory Visit (HOSPITAL_COMMUNITY): Payer: Self-pay

## 2023-09-29 ENCOUNTER — Ambulatory Visit (HOSPITAL_COMMUNITY): Payer: 59

## 2023-09-29 ENCOUNTER — Ambulatory Visit (HOSPITAL_COMMUNITY)
Admission: RE | Admit: 2023-09-29 | Discharge: 2023-09-29 | Disposition: A | Discharge status: Home / Self Care | Payer: 59 | Attending: Orthopaedic Surgery | Admitting: Orthopaedic Surgery

## 2023-09-29 ENCOUNTER — Encounter (HOSPITAL_BASED_OUTPATIENT_CLINIC_OR_DEPARTMENT_OTHER): Payer: Self-pay | Admitting: Orthopaedic Surgery

## 2023-09-29 ENCOUNTER — Encounter (HOSPITAL_COMMUNITY)
Admission: RE | Disposition: A | Discharge status: Home / Self Care | Payer: Self-pay | Source: Home / Self Care | Attending: Orthopaedic Surgery

## 2023-09-29 ENCOUNTER — Encounter (HOSPITAL_COMMUNITY): Payer: Self-pay | Admitting: Orthopaedic Surgery

## 2023-09-29 DIAGNOSIS — X58XXXA Exposure to other specified factors, initial encounter: Secondary | ICD-10-CM | POA: Diagnosis not present

## 2023-09-29 DIAGNOSIS — M25852 Other specified joint disorders, left hip: Secondary | ICD-10-CM | POA: Insufficient documentation

## 2023-09-29 DIAGNOSIS — S73192A Other sprain of left hip, initial encounter: Secondary | ICD-10-CM | POA: Insufficient documentation

## 2023-09-29 DIAGNOSIS — M7632 Iliotibial band syndrome, left leg: Secondary | ICD-10-CM | POA: Diagnosis not present

## 2023-09-29 DIAGNOSIS — Z96642 Presence of left artificial hip joint: Secondary | ICD-10-CM | POA: Diagnosis not present

## 2023-09-29 DIAGNOSIS — M25859 Other specified joint disorders, unspecified hip: Secondary | ICD-10-CM

## 2023-09-29 SURGERY — ARTHROSCOPY, HIP, WITH LABRUM REPAIR
Anesthesia: General | Laterality: Left

## 2023-09-29 MED ORDER — ORAL CARE MOUTH RINSE: 15.0000 mL | Freq: Once | OROMUCOSAL | Status: AC

## 2023-09-29 MED ORDER — BUPIVACAINE HCL (PF) 0.25 % IJ SOLN
INTRAMUSCULAR | Status: AC
  Filled 2023-09-29: qty 30

## 2023-09-29 MED ORDER — PROPOFOL 500 MG/50ML IV EMUL: INTRAVENOUS | Status: DC | PRN

## 2023-09-29 MED ORDER — PROPOFOL 10 MG/ML IV BOLUS
INTRAVENOUS | Status: DC | PRN
  Administered 2023-09-29: 200 mg via INTRAVENOUS

## 2023-09-29 MED ORDER — ROCURONIUM BROMIDE 10 MG/ML (PF) SYRINGE
PREFILLED_SYRINGE | INTRAVENOUS | Status: DC | PRN
  Administered 2023-09-29: 10 mg via INTRAVENOUS
  Administered 2023-09-29: 20 mg via INTRAVENOUS
  Administered 2023-09-29: 50 mg via INTRAVENOUS

## 2023-09-29 MED ORDER — BUPIVACAINE-EPINEPHRINE (PF) 0.25% -1:200000 IJ SOLN
INTRAMUSCULAR | Status: AC
  Filled 2023-09-29: qty 30

## 2023-09-29 MED ORDER — CEFAZOLIN SODIUM-DEXTROSE 2-4 GM/100ML-% IV SOLN: 2.0000 g | INTRAVENOUS | Status: DC

## 2023-09-29 MED ORDER — AMISULPRIDE (ANTIEMETIC) 5 MG/2ML IV SOLN
INTRAVENOUS | Status: AC
  Filled 2023-09-29: qty 4

## 2023-09-29 MED ORDER — FENTANYL CITRATE (PF) 100 MCG/2ML IJ SOLN
25.0000 ug | INTRAMUSCULAR | Status: DC | PRN
  Administered 2023-09-29 (×2): 50 ug via INTRAVENOUS

## 2023-09-29 MED ORDER — ACETAMINOPHEN 325 MG PO TABS: 325.0000 mg | ORAL_TABLET | ORAL | Status: DC | PRN

## 2023-09-29 MED ORDER — DEXAMETHASONE SODIUM PHOSPHATE 10 MG/ML IJ SOLN
INTRAMUSCULAR | Status: DC | PRN
  Administered 2023-09-29: 10 mg via INTRAVENOUS

## 2023-09-29 MED ORDER — KETAMINE HCL 50 MG/5ML IJ SOSY
PREFILLED_SYRINGE | INTRAMUSCULAR | Status: AC
  Filled 2023-09-29: qty 5

## 2023-09-29 MED ORDER — CEFAZOLIN SODIUM-DEXTROSE 2-4 GM/100ML-% IV SOLN
2.0000 g | INTRAVENOUS | Status: AC
  Administered 2023-09-29: 2 g via INTRAVENOUS
  Filled 2023-09-29: qty 100

## 2023-09-29 MED ORDER — ACETAMINOPHEN 160 MG/5ML PO SOLN: 325.0000 mg | ORAL | Status: DC | PRN

## 2023-09-29 MED ORDER — GABAPENTIN 300 MG PO CAPS
300.0000 mg | ORAL_CAPSULE | Freq: Once | ORAL | Status: AC
Start: 2023-09-29 — End: 2023-09-29
  Administered 2023-09-29: 300 mg via ORAL
  Filled 2023-09-29: qty 1

## 2023-09-29 MED ORDER — EPHEDRINE SULFATE-NACL 50-0.9 MG/10ML-% IV SOSY
PREFILLED_SYRINGE | INTRAVENOUS | Status: DC | PRN
  Administered 2023-09-29: 10 mg via INTRAVENOUS
  Administered 2023-09-29: 5 mg via INTRAVENOUS

## 2023-09-29 MED ORDER — ONDANSETRON HCL 4 MG/2ML IJ SOLN
4.0000 mg | Freq: Once | INTRAMUSCULAR | Status: AC | PRN
  Administered 2023-09-29: 4 mg via INTRAVENOUS

## 2023-09-29 MED ORDER — BUPIVACAINE HCL 0.25 % IJ SOLN
INTRAMUSCULAR | Status: DC | PRN
  Administered 2023-09-29: 30 mL

## 2023-09-29 MED ORDER — FENTANYL CITRATE (PF) 250 MCG/5ML IJ SOLN
INTRAMUSCULAR | Status: DC | PRN
  Administered 2023-09-29 (×2): 50 ug via INTRAVENOUS

## 2023-09-29 MED ORDER — FENTANYL CITRATE (PF) 100 MCG/2ML IJ SOLN
INTRAMUSCULAR | Status: AC
  Filled 2023-09-29: qty 2

## 2023-09-29 MED ORDER — OXYCODONE HCL 5 MG/5ML PO SOLN
5.0000 mg | Freq: Once | ORAL | Status: AC | PRN
  Administered 2023-09-29: 5 mg via ORAL

## 2023-09-29 MED ORDER — LACTATED RINGERS IV SOLN
INTRAVENOUS | Status: DC
Start: 2023-09-29 — End: 2023-09-29

## 2023-09-29 MED ORDER — MEPERIDINE HCL 25 MG/ML IJ SOLN
6.2500 mg | INTRAMUSCULAR | Status: DC | PRN
Start: 2023-09-29 — End: 2023-09-29

## 2023-09-29 MED ORDER — SODIUM CHLORIDE 0.9 % IR SOLN
Status: DC | PRN
  Administered 2023-09-29: 1000 mL
  Administered 2023-09-29 (×2): 3000 mL

## 2023-09-29 MED ORDER — OXYCODONE HCL 5 MG PO TABS: 5.0000 mg | ORAL_TABLET | Freq: Once | ORAL | Status: AC | PRN

## 2023-09-29 MED ORDER — KETAMINE HCL 10 MG/ML IJ SOLN
INTRAMUSCULAR | Status: DC | PRN
  Administered 2023-09-29: 10 mg via INTRAVENOUS
  Administered 2023-09-29: 20 mg via INTRAVENOUS

## 2023-09-29 MED ORDER — OXYCODONE HCL 5 MG/5ML PO SOLN
ORAL | Status: AC
  Filled 2023-09-29: qty 5

## 2023-09-29 MED ORDER — AMISULPRIDE (ANTIEMETIC) 5 MG/2ML IV SOLN
10.0000 mg | Freq: Once | INTRAVENOUS | Status: AC
  Administered 2023-09-29: 10 mg via INTRAVENOUS

## 2023-09-29 MED ORDER — PHENYLEPHRINE 80 MCG/ML (10ML) SYRINGE FOR IV PUSH (FOR BLOOD PRESSURE SUPPORT)
PREFILLED_SYRINGE | INTRAVENOUS | Status: DC | PRN
  Administered 2023-09-29 (×6): 80 ug via INTRAVENOUS

## 2023-09-29 MED ORDER — LIDOCAINE 2% (20 MG/ML) 5 ML SYRINGE
INTRAMUSCULAR | Status: DC | PRN
  Administered 2023-09-29: 40 mg via INTRAVENOUS

## 2023-09-29 MED ORDER — EPINEPHRINE PF 1 MG/ML IJ SOLN
INTRAMUSCULAR | Status: AC
  Filled 2023-09-29: qty 3

## 2023-09-29 MED ORDER — PROPOFOL 10 MG/ML IV BOLUS
INTRAVENOUS | Status: AC
  Filled 2023-09-29: qty 20

## 2023-09-29 MED ORDER — SUGAMMADEX SODIUM 200 MG/2ML IV SOLN
INTRAVENOUS | Status: DC | PRN
  Administered 2023-09-29: 200 mg via INTRAVENOUS

## 2023-09-29 MED ORDER — MIDAZOLAM HCL 2 MG/2ML IJ SOLN
INTRAMUSCULAR | Status: AC
  Filled 2023-09-29: qty 2

## 2023-09-29 MED ORDER — HYDROMORPHONE HCL 1 MG/ML IJ SOLN
INTRAMUSCULAR | Status: AC
  Filled 2023-09-29: qty 0.5

## 2023-09-29 MED ORDER — MIDAZOLAM HCL 2 MG/2ML IJ SOLN
INTRAMUSCULAR | Status: DC | PRN
  Administered 2023-09-29: 2 mg via INTRAVENOUS

## 2023-09-29 MED ORDER — OXYCODONE HCL 5 MG PO TABS
5.0000 mg | ORAL_TABLET | ORAL | 0 refills | Status: DC | PRN
  Filled 2023-09-29: qty 15, 3d supply, fill #0

## 2023-09-29 MED ORDER — ACETAMINOPHEN 500 MG PO TABS
1000.0000 mg | ORAL_TABLET | Freq: Once | ORAL | Status: AC
  Administered 2023-09-29: 1000 mg via ORAL
  Filled 2023-09-29: qty 2

## 2023-09-29 MED ORDER — HYDROMORPHONE HCL 1 MG/ML IJ SOLN
INTRAMUSCULAR | Status: DC | PRN
  Administered 2023-09-29: .5 mg via INTRAVENOUS

## 2023-09-29 MED ORDER — CHLORHEXIDINE GLUCONATE 0.12 % MT SOLN
15.0000 mL | Freq: Once | OROMUCOSAL | Status: AC
  Administered 2023-09-29: 15 mL via OROMUCOSAL
  Filled 2023-09-29: qty 15

## 2023-09-29 MED ORDER — KETOROLAC TROMETHAMINE 30 MG/ML IJ SOLN
INTRAMUSCULAR | Status: DC | PRN
  Administered 2023-09-29: 30 mg via INTRAVENOUS

## 2023-09-29 MED ORDER — TRANEXAMIC ACID-NACL 1000-0.7 MG/100ML-% IV SOLN
1000.0000 mg | INTRAVENOUS | Status: AC
Start: 2023-09-29 — End: 2023-09-29
  Administered 2023-09-29: 1000 mg via INTRAVENOUS
  Filled 2023-09-29: qty 100

## 2023-09-29 SURGICAL SUPPLY — 60 items
ANCH SUT .5 CRC TPR CT 40X40 (SUTURE) ×1
ANCH SUT 25D 1.4 FLXINSRTR (Anchor) ×2 IMPLANT
ANCHOR SUT 1.4 FLEX (Anchor) IMPLANT
APL PRP STRL LF DISP 70% ISPRP (MISCELLANEOUS) ×1
BAG COUNTER SPONGE SURGICOUNT (BAG) IMPLANT
BAG SPNG CNTER NS LX DISP (BAG)
BIT DRILL FLEX NANOTACK (BIT) IMPLANT
BLADE SAMURAI STR FULL RADIUS (BLADE) IMPLANT
BLADE SURG 11 STRL SS (BLADE) ×1 IMPLANT
BUR ROUND HI FLUTE 8 4X19 (BURR) ×1 IMPLANT
BURR ROUND HI FLUTE 8 4X19 (BURR) ×1
CANNULA OBTURATOR FLOWPORT ST5 (CANNULA) IMPLANT
CHLORAPREP W/TINT 26 (MISCELLANEOUS) ×1 IMPLANT
DISSECTOR 4.2MMX19CM HL (MISCELLANEOUS) ×1 IMPLANT
DRAPE C-ARM 42X72 X-RAY (DRAPES) ×1 IMPLANT
DRAPE STERI IOBAN 125X83 (DRAPES) ×1 IMPLANT
DRAPE U-SHAPE 47X51 STRL (DRAPES) ×2 IMPLANT
DRSG TEGADERM 4X4.75 (GAUZE/BANDAGES/DRESSINGS) ×2 IMPLANT
FEE RENTAL EQUIP HIP INSTR KIT (INSTRUMENTS) IMPLANT
GAUZE SPONGE 4X4 12PLY STRL (GAUZE/BANDAGES/DRESSINGS) ×1 IMPLANT
GAUZE XEROFORM 1X8 LF (GAUZE/BANDAGES/DRESSINGS) IMPLANT
GLOVE BIO SURGEON STRL SZ7.5 (GLOVE) ×1 IMPLANT
GLOVE BIOGEL PI IND STRL 6.5 (GLOVE) ×1 IMPLANT
GLOVE BIOGEL PI IND STRL 8 (GLOVE) ×1 IMPLANT
GLOVE ECLIPSE 6.0 STRL STRAW (GLOVE) ×1 IMPLANT
GOWN STRL REUS W/ TWL LRG LVL3 (GOWN DISPOSABLE) ×2 IMPLANT
GOWN STRL REUS W/ TWL XL LVL3 (GOWN DISPOSABLE) ×1 IMPLANT
GOWN STRL REUS W/TWL LRG LVL3 (GOWN DISPOSABLE) ×2
GOWN STRL REUS W/TWL XL LVL3 (GOWN DISPOSABLE) ×1
INSTRUMENT ORTHO TEXT HIP FEM (INSTRUMENTS) IMPLANT
KIT BASIN OR (CUSTOM PROCEDURE TRAY) ×1 IMPLANT
KIT HIP ARTHROSCOPY (ORTHOPEDIC DISPOSABLE SUPPLIES) ×1 IMPLANT
KIT PATIENT POSITION LRG (KITS) IMPLANT
KIT PORTAL ENTRY HIP ACCESS (KITS) IMPLANT
KIT TURNOVER KIT B (KITS) ×1 IMPLANT
MANIFOLD NEPTUNE II (INSTRUMENTS) ×2 IMPLANT
NDL INJECTOR II CARTRIDGE (MISCELLANEOUS) IMPLANT
NDL SPNL 18GX3.5 QUINCKE PK (NEEDLE) ×1 IMPLANT
NEEDLE INJECTOR II CARTRIDGE (MISCELLANEOUS) ×1 IMPLANT
NEEDLE SPNL 18GX3.5 QUINCKE PK (NEEDLE) ×1 IMPLANT
PACK BASIC III (CUSTOM PROCEDURE TRAY) ×1
PACK SRG BSC III STRL LF ECLPS (CUSTOM PROCEDURE TRAY) ×1 IMPLANT
PAD ARMBOARD 7.5X6 YLW CONV (MISCELLANEOUS) ×2 IMPLANT
PASSER SUT 1.5D CRESCENT (INSTRUMENTS) IMPLANT
PASSER SUT 70D UP ANGLED (INSTRUMENTS) IMPLANT
SPIKE FLUID TRANSFER (MISCELLANEOUS) ×1 IMPLANT
SPONGE T-LAP 18X18 ~~LOC~~+RFID (SPONGE) ×1 IMPLANT
SUT FIBERWIRE #2 38 T-5 BLUE (SUTURE)
SUT XBRAID 1.4 BLUE (SUTURE) IMPLANT
SUTURE FIBERWR #2 38 T-5 BLUE (SUTURE) IMPLANT
SUTURE TAPE XBRAID 1.2 BLUE 45 (SUTURE) IMPLANT
SUTURETAPE XBRAID 1.2 BLUE 45 (SUTURE) ×1
SYR 50ML LL SCALE MARK (SYRINGE) ×1 IMPLANT
TOWEL GREEN STERILE (TOWEL DISPOSABLE) ×1 IMPLANT
TRAY ARTHROSCOPY HIP STRE FEE (INSTRUMENTS) ×1 IMPLANT
TRAY PIVOT PORT STRE FEE (INSTRUMENTS) ×1 IMPLANT
TUBE CONNECTING 12X1/4 (SUCTIONS) ×2 IMPLANT
TUBING ARTHROSCOPY IRRIG 16FT (MISCELLANEOUS) ×1 IMPLANT
WAND APOLLO RF 50D ABLATOR (BUR) IMPLANT
WATER STERILE IRR 1000ML POUR (IV SOLUTION) ×1 IMPLANT

## 2023-09-29 NOTE — Interval H&P Note (Signed)
History and Physical Interval Note:  09/29/2023 1:17 PM  Zachary Little  has presented today for surgery, with the diagnosis of LEFT HIP IMPINGEMENT.  The various methods of treatment have been discussed with the patient and family. After consideration of risks, benefits and other options for treatment, the patient has consented to  Procedure(s): LEFT HIP ARTHROSCOPY WITH LABRAL REPAIR / CAM DEBRIDEMENT (Left) as a surgical intervention.  The patient's history has been reviewed, patient examined, no change in status, stable for surgery.  I have reviewed the patient's chart and labs.  Questions were answered to the patient's satisfaction.     Huel Cote

## 2023-09-29 NOTE — Anesthesia Postprocedure Evaluation (Signed)
Anesthesia Post Note  Patient: Zachary Little  Procedure(s) Performed: LEFT HIP ARTHROSCOPY WITH LABRAL REPAIR / CAM DEBRIDEMENT (Left)     Patient location during evaluation: PACU Anesthesia Type: General Level of consciousness: awake and alert, oriented and patient cooperative Pain management: pain level controlled Vital Signs Assessment: post-procedure vital signs reviewed and stable Respiratory status: spontaneous breathing, nonlabored ventilation and respiratory function stable Cardiovascular status: blood pressure returned to baseline and stable Postop Assessment: no apparent nausea or vomiting Anesthetic complications: no   No notable events documented.  Last Vitals:  Vitals:   09/29/23 1547 09/29/23 1600  BP: 116/73 114/83  Pulse: 91 74  Resp: 18 16  Temp: (!) 36.2 C   SpO2: 100% 98%    Last Pain:  Vitals:   09/29/23 1547  PainSc: 6                  Lannie Fields

## 2023-09-29 NOTE — Transfer of Care (Signed)
Immediate Anesthesia Transfer of Care Note  Patient: Taner Rzepka  Procedure(s) Performed: LEFT HIP ARTHROSCOPY WITH LABRAL REPAIR / CAM DEBRIDEMENT (Left)  Patient Location: PACU  Anesthesia Type:General  Level of Consciousness: awake, alert , and oriented  Airway & Oxygen Therapy: Patient Spontanous Breathing  Post-op Assessment: Report given to RN and Post -op Vital signs reviewed and stable  Post vital signs: Reviewed and stable  Last Vitals:  Vitals Value Taken Time  BP 116/73 09/29/23 1547  Temp 36.2 C 09/29/23 1547  Pulse 82 09/29/23 1553  Resp 15 09/29/23 1553  SpO2 99 % 09/29/23 1553  Vitals shown include unfiled device data.  Last Pain:  Vitals:   09/29/23 1310  PainSc: 0-No pain         Complications: No notable events documented.

## 2023-09-29 NOTE — Op Note (Signed)
Date of Surgery: 09/29/2023  INDICATIONS: Zachary Little is a 42 y.o.-year-old male with left hip femoracetabular impingement.  The risk and benefits of the procedure were discussed in detail and documented in the pre-operative evaluation.   PREOPERATIVE DIAGNOSIS: 1. 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  PROCEDURE: 1. Left hip labral repair Left hip CAM debridement  SURGEON: Benancio Deeds MD  ASSISTANT: Kerby Less, ATC  ANESTHESIA:  general  IV FLUIDS AND URINE: See anesthesia record.  ANTIBIOTICS: Ancef  ESTIMATED BLOOD LOSS: 10 mL.  IMPLANTS:  Implant Name Type Inv. Item Serial No. Manufacturer Lot No. LRB No. Used Action  ANCH SUT 25D 1.4 FLXINSRTR - [id number] Anchor ANCH SUT 25D 1.4 FLXINSRTR  [employer] [id number] Left 1 Implanted  ANCH SUT 25D 1.4 FLXINSRTR - [id number] Anchor ANCH SUT 25D 1.4 FLXINSRTR  [employer] [id number] Left 1 Implanted    DRAINS: None  CULTURES: None  COMPLICATIONS: none  DESCRIPTION OF PROCEDURE:  Cartilage Intact femoral and acetabular cartilage   Labrum Torn/Frayed appearing   Boundaries of labral tear Convention (3 o'clock anterior, 9 o'clock posterior) Anterior boundary: 3 o'clock Posterior boundary: 1 o'clock   OPERATIVE REPORT:  The patient was brought to the operating room, placed supine on the operating table, and bony prominences were padded.  The traction boots were applied with padding to ensure that safe traction could be applied through the feet.  The contralateral limb was abducted maximally and light traction was applied.  The operative leg was brought into neutral position.  The flouroscopic c-arm was brought between the legs for an AP image.  The patient was prepped and draped in a sterile fashion.  Time-out was performed and landmarks were identified. Traction was obtained and care was taken to ensure the least amount of force necessary to allow safe access to the  joint of 8-53mm.  This was checked with fluoroscopy.    Next we placed an anterolateral portal under the assistance of fluoroscopy.  First, fluoroscopy was used to estimate the trajectory and starting point.  A 5mm incision with a #11 blade was made and a straight hemostat was used to dilate the portal through the appropriate tract.  We then placed a 14-gauge hypodermic needle with careful technique to be as close to the femoral head as possible and parallel to the sorcele to ensure no iatrogenic damage to the labrum.  This released the negative pressure environment and the amount of traction was adjusted to maintain the 8-75mm of distraction.  A nitinol wire was placed through the needle and flouroscopy was used to ensure it extended to the medial wall of the acetabulum.  The Flowport from [employer] was placed over the wire and the nitinol wire was retracted to just inside the capsule during insertion of the dilator and cannula to minimize the risk of breakage. The arthroscope was placed next and we visualized the anterior triangle.     We then placed the anterior portal under direct visualization using the technique described above.  This was safely placed as well without damage to the labrum or femoral head.  We then switched our arthroscope to the anterior portal to ensure we were not through the labrum - we were safely through the capsule only.  We then proceeded with a transverse capsulotomy connecting the 2 portals in the same plane utilizing the Samurai blade from [employer].  We identified the anterior inferior iliac spine proximally, the psoas tendon  medially and the rectus tendon laterally as landmarks.  We then proceeded with a diagnostic arthroscopy - the results can be found in the findings section above.    We then used the 50 degree hip specific radiofrequency device from [employer]. and a 4mm shaver to clear the superior acetabulum and expose the subspinous region.  Next  we exposed the acetabular rim leaving the chondral labral junction intact.  Working from both portals, the acetabular rim/subspinous region was reshaped with 5.5 mm bur consistent with the preoperative three-dimensional imaging.   When adequate reshaping was obtained we then proceeded with the labral repair. We placed 2 anchors at the 1:00 and 2:30 positions. The sutures were passed using the crescent Nanopass from [employer].  This resulted in anatomic labral repair.  We debrided the loose cartilage at the rim and residual degenerative labral tissue.  Traction was let down with total traction time of 36 minutes.  At this time fluoroscopy was used to perform a three-dimensional resection of the cam deformity with a bur under AP and lateral fluoroscopy with varying levels of flexion and external rotation.  This was brought back to a spherical head with alpha angle less than 55 degrees.   Finally, we performed a complete capsular closure with tape suture.  She was replaced in the anterior and posterior limb of the reported capsulotomy with excellent apposition. We then removed the arthroscope and closed the incisions with 3-0 nylon simple stitches.  A sterile dressing was applied..  The patient was awakened from anesthesia and transferred to PACU in stable condition. Postoperative care includes:       POSTOPERATIVE PLAN:    2 weeks touchdown weight bearing, hip labral repair protocol Formal physical therapy will begin this week. ASA 325 Daily for DVT prophylaxis   Benancio Deeds, MD 3:27 PM

## 2023-09-29 NOTE — Anesthesia Preprocedure Evaluation (Addendum)
Anesthesia Evaluation  Patient identified by MRN, date of birth, ID band Patient awake    Reviewed: Allergy & Precautions, H&P , NPO status , Patient's Chart, lab work & pertinent test results  Airway Mallampati: I  TM Distance: >3 FB Neck ROM: Full    Dental no notable dental hx. (+) Teeth Intact, Dental Advisory Given   Pulmonary neg pulmonary ROS   Pulmonary exam normal breath sounds clear to auscultation       Cardiovascular Exercise Tolerance: Good negative cardio ROS Normal cardiovascular exam Rhythm:Regular Rate:Normal     Neuro/Psych negative neurological ROS  negative psych ROS   GI/Hepatic negative GI ROS, Neg liver ROS,,,  Endo/Other  negative endocrine ROS    Renal/GU negative Renal ROS  negative genitourinary   Musculoskeletal negative musculoskeletal ROS (+)    Abdominal   Peds negative pediatric ROS (+)  Hematology negative hematology ROS (+)   Anesthesia Other Findings   Reproductive/Obstetrics negative OB ROS                             Anesthesia Physical Anesthesia Plan  ASA: 2  Anesthesia Plan: General   Post-op Pain Management: Minimal or no pain anticipated, Tylenol PO (pre-op)* and Celebrex PO (pre-op)*   Induction: Intravenous  PONV Risk Score and Plan: 1 and Ondansetron, Dexamethasone and Treatment may vary due to age or medical condition  Airway Management Planned: Oral ETT  Additional Equipment: None  Intra-op Plan:   Post-operative Plan: Extubation in OR  Informed Consent: I have reviewed the patients History and Physical, chart, labs and discussed the procedure including the risks, benefits and alternatives for the proposed anesthesia with the patient or authorized representative who has indicated his/her understanding and acceptance.       Plan Discussed with: Anesthesiologist and CRNA  Anesthesia Plan Comments: (  )        Anesthesia Quick Evaluation

## 2023-09-29 NOTE — OR Nursing (Signed)
Traction on at 1417 down 1453

## 2023-09-29 NOTE — Brief Op Note (Signed)
   Brief Op Note  Date of Surgery: 09/29/2023  Preoperative Diagnosis: Impingement Left Hip  Postoperative Diagnosis: same  Procedure: Procedure(s): LEFT HIP ARTHROSCOPY WITH LABRAL REPAIR / CAM DEBRIDEMENT  Implants: Implant Name Type Inv. Item Serial No. Manufacturer Lot No. LRB No. Used Action  ANCH SUT 25D 1.4 FLXINSRTR - [id number] Anchor ANCH SUT 25D 1.4 FLXINSRTR  [employer] [id number] Left 1 Implanted  ANCH SUT 25D 1.4 FLXINSRTR - [id number] Anchor ANCH SUT 25D 1.4 FLXINSRTR  [employer] [id number] Left 1 Implanted    Surgeons: Surgeon(s): Huel Cote, MD  Anesthesia: General    Estimated Blood Loss: See anesthesia record  Complications: None  Condition to PACU: Stable  Benancio Deeds, MD 09/29/2023 3:27 PM

## 2023-09-29 NOTE — Discharge Instructions (Signed)
Discharge Instructions    Attending Surgeon: Huel Cote, MD Office Phone Number: [phone]   Diagnosis and Procedures:    Surgeries Performed: Left hip labral repair with CAM debridement  Discharge Plan:    Diet: Resume usual diet. Begin with light or bland foods.  Drink plenty of fluids.  Activity:  Touchdown weight bearing for 2 weeks. You are advised to go home directly from the hospital or surgical center. Restrict your activities.  GENERAL INSTRUCTIONS: 1.  Please apply ice to your wound to help with swelling and inflammation. This will improve your comfort and your overall recovery following surgery.     2. Please call Dr. Serena Croissant office at [phone] with questions Monday-Friday during business hours. If no one answers, please leave a message and someone should get back to the patient within 24 hours. For emergencies please call 911 or proceed to the emergency room.   3. Patient to notify surgical team if experiences any of the following: Bowel/Bladder dysfunction, uncontrolled pain, nerve/muscle weakness, incision with increased drainage or redness, nausea/vomiting and Fever greater than 101.0 F.  Be alert for signs of infection including redness, streaking, odor, fever or chills. Be alert for excessive pain or bleeding and notify your surgeon immediately.  WOUND INSTRUCTIONS:   Leave your dressing, cast, or splint in place until your post operative visit.  Keep it clean and dry.  Always keep the incision clean and dry until the staples/sutures are removed. If there is no drainage from the incision you should keep it open to air. If there is drainage from the incision you must keep it covered at all times until the drainage stops  Do not soak in a bath tub, hot tub, pool, lake or other body of water until 21 days after your surgery and your incision is completely dry and healed.  If you have removable sutures (or staples) they must be removed 10-14 days  (unless otherwise instructed) from the day of your surgery.     1)  Elevate the extremity as much as possible.  2)  Keep the dressing clean and dry.  3)  Please call us if the dressing becomes wet or dirty.  4)  If you are experiencing worsening pain or worsening swelling, please call.     MEDICATIONS: Resume all previous home medications at the previous prescribed dose and frequency unless otherwise noted Start taking the  pain medications on an as-needed basis as prescribed  Please taper down pain medication over the next week following surgery.  Ideally you should not require a refill of any narcotic pain medication.  Take pain medication with food to minimize nausea. In addition to the prescribed pain medication, you may take over-the-counter pain relievers such as Tylenol.  Do NOT take additional tylenol if your pain medication already has tylenol in it.  Aspirin 325mg  daily per instructions on bottle. Narcotic policy: Per [facility] clinic policy, our goal is ensure optimal postoperative pain control with a multimodal pain management strategy. For all [facility] patients, our goal is to wean post-operative narcotic medications by 6 weeks post-operatively, and many times sooner. If this is not possible due to utilization of pain medication prior to surgery, your [facility] doctor will support your acute post-operative pain control for the first 6 weeks postoperatively, with a plan to transition you back to your primary pain team following that. Cyndia Skeeters will work to ensure a Therapist, occupational.       FOLLOWUP INSTRUCTIONS: 1. Follow up at  the Physical Therapy Clinic 3-4 days following surgery. This appointment should be scheduled unless other arrangements have been made.The Physical Therapy scheduling number is [phone] if an appointment has not already been arranged.  2. Contact Dr. Serena Croissant office during office hours at [phone] or the practice after hours line at [phone] for  non-emergencies. For medical emergencies call 911.   Discharge Location: Home

## 2023-09-29 NOTE — H&P (Signed)
Chief Complaint: Left hip pain        History of Present Illness:      08/04/2023: Presents today for follow-up of his left hip.  He has gotten 90+ percent relief after his left hip injection.  He has been able to run now.  He is experiencing some tenderness about the lateral aspect of the IT band on the right.  This has been bothering him more so now that his hip is feeling better.  He is scheduled for right shoulder labral repair with Dr. Everardo Pacific coming up.     Sriman Tally is a 42 y.o. male presents today with ongoing left hip and thigh pain which has been persistently painful for the last several years.  He states that he does enjoy running for exercise although he has been unable to do this now as result of his hip and groin pain.  He does enjoy walking as well although this is essentially come to a stop as a result of his pain.  He has been taking ibuprofen.  He is attempted to dial back activity wise although this has been very difficult as he does enjoy walking and running.  He is here today for further discussion.  He has been seeing Dr. Karie Schwalbe who obtained an MRI showing evidence of a left hip labral tear       Surgical History:   None   PMH/PSH/Family History/Social History/Meds/Allergies:         Past Medical History:  Diagnosis Date   Anxiety     Blood transfusion without reported diagnosis 2010    During Chemo Treatments   Leukemia, acute myeloid, in remission (HCC) 2010   Thyroid disease               Past Surgical History:  Procedure Laterality Date   CYSTECTOMY Left      Scrotum   PORT-A-CATH REMOVAL       WISDOM TOOTH EXTRACTION            Social History         Socioeconomic History   Marital status: Single      Spouse name: Not on file   Number of children: 0   Years of education: college   Highest education level: Bachelor's degree (e.g., BA, AB, BS)  Occupational History   Occupation: Disabled  Tobacco Use   Smoking status: Never    Smokeless tobacco: Never  Vaping Use   Vaping status: Never Used  Substance and Sexual Activity   Alcohol use: Yes      Comment: "maybe one drink every two years"   Drug use: No   Sexual activity: Yes      Partners: Female  Other Topics Concern   Not on file  Social History Narrative    Lives at home with his wife.    Left-handed.    No daily caffeine use.     Social Determinants of Health        Financial Resource Strain: Low Risk  (02/14/2023)    Overall Financial Resource Strain (CARDIA)     Difficulty of Paying Living Expenses: Not hard at all  Food Insecurity: No Food Insecurity (02/14/2023)    Hunger Vital Sign     Worried About Running Out of Food in the Last Year: Never true     Ran Out of Food in the Last Year: Never true  Transportation Needs: No Transportation Needs (02/14/2023)    PRAPARE -  Therapist, art (Medical): No     Lack of Transportation (Non-Medical): No  Physical Activity: Insufficiently Active (02/14/2023)    Exercise Vital Sign     Days of Exercise per Week: 3 days     Minutes of Exercise per Session: 30 min  Stress: No Stress Concern Present (02/14/2023)    [employer] of Occupational Health - Occupational Stress Questionnaire     Feeling of Stress : Only a little  Social Connections: Unknown (02/14/2023)    Social Connection and Isolation Panel [NHANES]     Frequency of Communication with Friends and Family: Once a week     Frequency of Social Gatherings with Friends and Family: Patient declined     Attends Religious Services: Never     Database administrator or Organizations: No     Attends Engineer, structural: Not on file     Marital Status: Married         Family History  Problem Relation Age of Onset   Healthy Mother     Hypertension Father     Multiple sclerosis Maternal Grandmother     Dementia Maternal Grandmother     Diabetes Maternal Grandfather     Heart attack Maternal Grandfather 25    Diabetes Paternal Grandfather     Breast cancer Maternal Aunt     Prostate cancer Neg Hx     Colon cancer Neg Hx          Allergies       Allergies  Allergen Reactions   Topamax [Topiramate] Other (See Comments)      Anger/irritable   Trazodone And Nefazodone Nausea Only            Current Outpatient Medications  Medication Sig Dispense Refill   diazepam (VALIUM) 5 MG tablet Take 1 tablet (5 mg total) by mouth every 12 (twelve) hours as needed for muscle spasms 30 tablet 0   levothyroxine (SYNTHROID) 150 MCG tablet Take 1 tablet (150 mcg total) by mouth daily. 90 tablet 1      No current facility-administered medications for this visit.      Imaging Results (Last 48 hours)  No results found.     Review of Systems:   A ROS was performed including pertinent positives and negatives as documented in the HPI.   Physical Exam :   Constitutional: NAD and appears stated age Neurological: Alert and oriented Psych: Appropriate affect and cooperative There were no vitals taken for this visit.    Comprehensive Musculoskeletal Exam:     Inspection Right Left  Skin No atrophy or gross abnormalities appreciated No atrophy or gross abnormalities appreciated  Palpation      Tenderness None None  Crepitus None None  Range of Motion      Flexion (passive) 120 120  Extension 30 30  IR 30 30 with pain  ER 45 45  Strength      Flexion  5/5 5/5  Extension 5/5 5/5  Special Tests      FABER Negative Negative  FADIR Negative Positive  ER Lag/Capsular Insufficiency Negative Negative  Instability Negative Negative  Sacroiliac pain Negative  Negative   Instability      Generalized Laxity No No  Neurologic      sciatic, femoral, obturator nerves intact to light sensation  Vascular/Lymphatic      DP pulse 2+ 2+  Lumbar Exam      Patient has symmetric lumbar range of  motion with negative pain referral to hip    Tenderness over the IT band laterally there is no snapping as he  moves the knee from flexion into extension.  Positive Ober test on the right   Imaging:   Xray (3 views left hip): Significant cam deformity with alpha angle 60 degrees   MRI (left hip): Significant cam deformity with alpha angle of 60 degrees and anterior superior labral tear   I personally reviewed and interpreted the radiographs.     Assessment:   42 y.o. male with evidence of anterior superior labral tearing in the setting of femoral acetabular impingement and cam lesion.  At this time he has not had an injection with 90% plus relief doing extremely well from this perspective.  He is scheduled to have an upcoming right shoulder labral repair done.  I did discuss that ultimately the shoulder would be very important in terms of having it available for rehab in the lower extremity.  Particularly for a period of nonweightbearing for 2 weeks.  He is having some IT band tendinitis on the right which I have offered ultrasound-guided injection which she would like to proceed today.  He will think about the timing of the surgery for the hip and send [place] a MyChart message Plan :     -Right IT band injection provided after verbal consent obtained -Likely plan for left hip arthroscopy with labral repair and cam debridement     After a lengthy discussion of treatment options, including risks, benefits, alternatives, complications of surgical and nonsurgical conservative options, the patient elected surgical repair.    The patient  is aware of the material risks  and complications including, but not limited to injury to adjacent structures, neurovascular injury, infection, numbness, bleeding, implant failure, thermal burns, stiffness, persistent pain, failure to heal, disease transmission from allograft, need for further surgery, dislocation, anesthetic risks, blood clots, risks of death,and others. The probabilities of surgical success and failure discussed with patient given their particular  co-morbidities.The time and nature of expected rehabilitation and recovery was discussed.The patient's questions were all answered preoperatively.  No barriers to understanding were noted. I explained the natural history of the disease process and Rx rationale.  I explained to the patient what I considered to be reasonable expectations given their personal situation.  The final treatment plan was arrived at through a shared patient decision making process model.      Procedure Note   Patient: Zane Pellecchia                                                         Date of Birth: 1981/05/24                                                   MRN: [id number]  Visit Date: 08/04/2023   Procedures: Visit Diagnoses: No diagnosis found.   Large Joint Inj: R knee on 08/04/2023 8:47 AM Indications: pain Details: 22 G 1.5 in needle, ultrasound-guided anterior approach   Arthrogram: No   Medications: 4 mL lidocaine 1 %; 80 mg triamcinolone acetonide 40 MG/ML Outcome: tolerated well, no immediate complications Procedure, treatment alternatives, risks and benefits explained, specific risks discussed. Consent was given by the patient. Immediately prior to procedure a time out was called to verify the correct patient, procedure, equipment, support staff and site/side marked as required. Patient was prepped and draped in the usual sterile fashion.                  I personally saw and evaluated the patient, and participated in the management and treatment plan.   Huel Cote, MD Attending Physician, Orthopedic Surgery   This document was dictated using Dragon voice recognition software. A reasonable attempt at proof reading has been made

## 2023-09-29 NOTE — Anesthesia Procedure Notes (Signed)
Procedure Name: Intubation Date/Time: 09/29/2023 1:52 PM  Performed by: Stanton Kidney, CRNAPre-anesthesia Checklist: Patient identified, Patient being monitored, Timeout performed, Emergency Drugs available and Suction available Patient Re-evaluated:Patient Re-evaluated prior to induction Oxygen Delivery Method: Circle system utilized Preoxygenation: Pre-oxygenation with 100% oxygen Induction Type: IV induction Ventilation: Mask ventilation without difficulty Laryngoscope Size: Miller and 3 Grade View: Grade I Tube type: Oral Tube size: 7.5 mm Number of attempts: 1 Airway Equipment and Method: Stylet Placement Confirmation: ETT inserted through vocal cords under direct vision, positive ETCO2 and breath sounds checked- equal and bilateral Secured at: 21 cm Tube secured with: Tape Dental Injury: Teeth and Oropharynx as per pre-operative assessment

## 2023-10-01 ENCOUNTER — Encounter: Payer: 59 | Admitting: Physician Assistant

## 2023-10-05 ENCOUNTER — Encounter (HOSPITAL_BASED_OUTPATIENT_CLINIC_OR_DEPARTMENT_OTHER): Payer: Self-pay | Admitting: Orthopaedic Surgery

## 2023-10-06 ENCOUNTER — Encounter (HOSPITAL_BASED_OUTPATIENT_CLINIC_OR_DEPARTMENT_OTHER): Payer: Self-pay | Admitting: Physical Therapy

## 2023-10-06 ENCOUNTER — Ambulatory Visit (HOSPITAL_BASED_OUTPATIENT_CLINIC_OR_DEPARTMENT_OTHER): Payer: 59 | Attending: Orthopaedic Surgery | Admitting: Physical Therapy

## 2023-10-06 ENCOUNTER — Other Ambulatory Visit: Payer: Self-pay

## 2023-10-06 DIAGNOSIS — M25859 Other specified joint disorders, unspecified hip: Secondary | ICD-10-CM | POA: Diagnosis present

## 2023-10-06 DIAGNOSIS — M25552 Pain in left hip: Secondary | ICD-10-CM | POA: Insufficient documentation

## 2023-10-06 DIAGNOSIS — M25852 Other specified joint disorders, left hip: Secondary | ICD-10-CM | POA: Insufficient documentation

## 2023-10-06 DIAGNOSIS — M6281 Muscle weakness (generalized): Secondary | ICD-10-CM | POA: Insufficient documentation

## 2023-10-06 DIAGNOSIS — R262 Difficulty in walking, not elsewhere classified: Secondary | ICD-10-CM | POA: Insufficient documentation

## 2023-10-06 NOTE — Therapy (Signed)
OUTPATIENT PHYSICAL THERAPY LOWER EXTREMITY EVALUATION   Patient Name: Zachary Little MRN: [id number] DOB:[birth year], 42 y.o., male Today's Date: 10/06/2023  END OF SESSION:  PT End of Session - 10/06/23 1010     Visit Number 1    Number of Visits 26    Date for PT Re-Evaluation 01/04/24    Authorization Type [facility] [employer]    PT Start Time 1005    PT Stop Time 1050    PT Time Calculation (min) 45 min    Activity Tolerance Patient tolerated treatment well    Behavior During Therapy WFL for tasks assessed/performed             Past Medical History:  Diagnosis Date   Anxiety    Blood transfusion without reported diagnosis 2010   During Chemo Treatments   Hypothyroidism    Leukemia, acute myeloid, in remission (HCC) 2010   Thyroid disease    Past Surgical History:  Procedure Laterality Date   CYSTECTOMY Left    Scrotum   PORT-A-CATH REMOVAL     WISDOM TOOTH EXTRACTION     Patient Active Problem List   Diagnosis Date Noted   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   Chronic right hip pain 09/23/2023   Labral tear of left hip joint 10/01/2022   DDD (degenerative disc disease), cervical, 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 C6 foraminal stenosis with left-sided periscapular discomfort 04/23/2022   Pes anserinus bursitis of left knee 03/19/2022   Chronic pain syndrome 12/29/2019   Neuropathic pain of thigh, right 12/29/2019   Labral tear of shoulder, right, sequela 10/06/2019   Depression, major, single episode, 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   Erectile dysfunction 03/09/2018   Obesity 03/09/2018   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 Left hip labral repair Left hip CAM debridement  SUBJECTIVE:   SUBJECTIVE STATEMENT: Pt has been compliant with WB precautions. Pt has been resting and protecting the hip appropriately. The lateral thigh on the L is aching and painful on occasion. Pt has been using aspirin and icing as directed. Denies NT. Denies s/s of infection. Pt states he has not been icing as much recently. Was not provided a brace post-op.   PERTINENT HISTORY: N/A PAIN:  Are you having pain? Yes: NPRS scale: 3/10 Pain location: L lateral Pain description: aching Aggravating factors: movement  Relieving factors: rest/ meds   PRECAUTIONS: Other: L hip labral repair  RED FLAGS: None   WEIGHT BEARING RESTRICTIONS: Yes TTWB for first two weeks  FALLS:  Has patient fallen in last 6 months? No  LIVING ENVIRONMENT: Lives with: lives with their family Lives in: House/apartment Stairs: 2nd story home Has following equipment at home: Crutches  OCCUPATION: N/A  Running- flat road with occasional trails/hills  PLOF: Independent  PATIENT GOALS: be able to run about 3 miles again     OBJECTIVE:  Note: Objective measures were completed at Evaluation unless otherwise noted.  DIAGNOSTIC FINDINGS: N/A  PATIENT SURVEYS:  FOTO 30 71 @ DC 19 pts MCII  COGNITION: Overall cognitive status: Within functional limits for tasks assessed                         SENSATION: WFL   MUSCLE LENGTH: Hamstrings: positive supine 90/90   POSTURE: No Significant postural limitations   PALPATION: NT around incision sites, no erythema, no signs of drainage, Tegaderm bandage in place   LOWER EXTREMITY ROM:   Passive ROM Right eval Left eval  Hip flexion WFL 75   Hip extension WFL 0  Hip abduction WFL 30  Hip adduction      Hip internal rotation WFL 30  Hip external rotation WFL 20  Knee flexion WFL 130  Knee extension WFL 0  Ankle dorsiflexion      Ankle plantarflexion      Ankle inversion      Ankle eversion       (Blank rows = not tested)   LOWER EXTREMITY MMT: Not tested 2/2 surgical precautions   GAIT: Distance walked: 23ft Assistive device utilized: Crutches Level of assistance: Complete Independence Comments: TTWB     TODAY'S TREATMENT:                                                                                                                              DATE:   11/11 Bandage change and wound inspection    Exercises - Supine Posterior Pelvic Tilt  - 2-3 x daily - 7 x weekly - 2 sets - 10 reps - 2 hold - Hooklying Gluteal Sets  - 2-3 x daily - 7 x weekly - 3 sets - 10 reps - Clamshell  - 2-3 x daily - 7 x weekly - 2 sets - 10 reps - Ankle Pumps in Elevation  - 2-3 x daily - 7 x weekly - 2 sets - 20 reps  PATIENT EDUCATION:  Education details: surgical precautions, s/s of infection, diagnosis, prognosis, anatomy, exercise progression, DOMS expectations, muscle firing,  envelope of function, HEP, POC Person educated: Patient Education method: Explanation, Demonstration, Tactile cues, Verbal cues, and Handouts Education comprehension: verbalized understanding, returned demonstration, verbal cues required, and tactile cues required   HOME EXERCISE PROGRAM:   Access Code: [email or web address] URL: [email or web address] Date: 10/06/2023 Prepared by: Zebedee Iba    ASSESSMENT:   CLINICAL IMPRESSION: Patient is a 42 y.o. male who was seen today for physical therapy evaluation and treatment for s/p L hip labral repair. Pt is post op day 7. Pt with expected gait, ROM, and strength. Wound and stitches intact without signs of infection. Pt able to return demo all edu regarding precautions and safety. Pt would benefit from  continued skilled therapy in order to reach goals and maximize functional L LE strength and ROM for full return to PLOF.    OBJECTIVE IMPAIRMENTS: decreased activity tolerance, decreased balance, decreased endurance, decreased mobility, decreased ROM, decreased strength, hypomobility,  increased muscle spasms, impaired flexibility, improper body mechanics, postural dysfunction, and pain.    ACTIVITY LIMITATIONS: lifting, squatting, locomotion level, and dressing   PARTICIPATION LIMITATIONS: interpersonal relationship, community activity, and exercise   PERSONAL FACTORS: Past/current experiences and Time since onset of injury/illness/exacerbation are also affecting patient's functional outcome.    REHAB POTENTIAL: Good   CLINICAL DECISION MAKING: Stable/uncomplicated   EVALUATION COMPLEXITY: Low     GOALS:     SHORT TERM GOALS: Target date: 11/17/2023       Pt will become independent with HEP in order to demonstrate synthesis of PT education.   Goal status: INITIAL   2. Pt will be able to demonstrate full hip AROM in order to demonstrate functional improvement in LE function for self-care and house hold duties.      Goal status: INITIAL   3.  Pt will report at least 2 pt reduction on NPRS scale for pain in order to demonstrate functional improvement with household activity, self care, and ADL.    Goal status: INITIAL   LONG TERM GOALS: Target date: 12/29/2023         Pt  will become independent with final HEP in order to demonstrate synthesis of PT education.   Goal status: INITIAL   2.  Pt will score >/= 71 on FOTO to demonstrate improvement in perceived L hip function.      Goal status: INITIAL   3.  Pt will be able to demonstrate 20x 8" box step downs without form deviation or pain in order to demonstrate functional improvement in LE function for return to hopping/jumping.    Goal status: INITIAL   4.  Pt will be able to demonstrate 80% strength with HHD in order  to demonstrate functional improvement and tolerance to return to running.    Goal status: INITIAL   5. Pt will be able to demonstrate ability to DL hop without pain in order to demonstrate functional improvement and tolerance to low level plyometric loading.     Goal status: INITIAL       PLAN:   PT FREQUENCY: 1-2x/week   PT DURATION: 12 weeks    PLANNED INTERVENTIONS: Therapeutic exercises, Therapeutic activity, Neuromuscular re-education, Balance training, Gait training, Patient/Family education, Self Care, Joint mobilization, Joint manipulation, Stair training, Aquatic Therapy, Dry Needling, Electrical stimulation, Spinal manipulation, Spinal mobilization, Cryotherapy, Moist heat, scar mobilization, Splintting, Taping, Vasopneumatic device, Traction, Ultrasound, Ionotophoresis 4mg /ml Dexamethasone, Manual therapy, and Re-evaluation   PLAN FOR NEXT SESSION: hip arthroscopy protocol; ROM, muscle activation; STM and joint mobs PRN    Zebedee Iba, PT 10/06/2023, 12:25 PM

## 2023-10-10 ENCOUNTER — Encounter (HOSPITAL_BASED_OUTPATIENT_CLINIC_OR_DEPARTMENT_OTHER): Payer: 59 | Admitting: Orthopaedic Surgery

## 2023-10-13 ENCOUNTER — Ambulatory Visit (HOSPITAL_BASED_OUTPATIENT_CLINIC_OR_DEPARTMENT_OTHER): Payer: 59 | Admitting: Physical Therapy

## 2023-10-13 ENCOUNTER — Encounter (HOSPITAL_BASED_OUTPATIENT_CLINIC_OR_DEPARTMENT_OTHER): Payer: Self-pay | Admitting: Physical Therapy

## 2023-10-13 ENCOUNTER — Ambulatory Visit (INDEPENDENT_AMBULATORY_CARE_PROVIDER_SITE_OTHER): Payer: 59 | Admitting: Orthopaedic Surgery

## 2023-10-13 DIAGNOSIS — M25552 Pain in left hip: Secondary | ICD-10-CM

## 2023-10-13 DIAGNOSIS — R262 Difficulty in walking, not elsewhere classified: Secondary | ICD-10-CM

## 2023-10-13 DIAGNOSIS — M6281 Muscle weakness (generalized): Secondary | ICD-10-CM

## 2023-10-13 DIAGNOSIS — M25852 Other specified joint disorders, left hip: Secondary | ICD-10-CM | POA: Diagnosis not present

## 2023-10-13 DIAGNOSIS — M25859 Other specified joint disorders, unspecified hip: Secondary | ICD-10-CM

## 2023-10-13 NOTE — Therapy (Signed)
OUTPATIENT PHYSICAL THERAPY LOWER EXTREMITY EVALUATION   Patient Name: Zachary Little MRN: [id number] DOB:May 07, 1981, 42 y.o., male Today's Date: 10/13/2023  END OF SESSION:  PT End of Session - 10/13/23 1045     Visit Number 2    Number of Visits 26    Date for PT Re-Evaluation 01/04/24    Authorization Type [facility] [employer]    PT Start Time 1015    PT Stop Time 1048    PT Time Calculation (min) 33 min    Activity Tolerance Patient tolerated treatment well    Behavior During Therapy WFL for tasks assessed/performed              Past Medical History:  Diagnosis Date   Anxiety    Blood transfusion without reported diagnosis 2010   During Chemo Treatments   Hypothyroidism    Leukemia, acute myeloid, in remission (HCC) 2010   Thyroid disease    Past Surgical History:  Procedure Laterality Date   CYSTECTOMY Left    Scrotum   PORT-A-CATH REMOVAL     WISDOM TOOTH EXTRACTION     Patient Active Problem List   Diagnosis Date Noted   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   Chronic right hip pain 09/23/2023   Labral tear of left hip joint 10/01/2022   DDD (degenerative disc disease), cervical, 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 C6 foraminal stenosis with left-sided periscapular discomfort 04/23/2022   Pes anserinus bursitis of left knee 03/19/2022   Chronic pain syndrome 12/29/2019   Neuropathic pain of thigh, right 12/29/2019   Labral tear of shoulder, right, sequela 10/06/2019   Depression, major, single episode, 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   Erectile dysfunction 03/09/2018   Obesity 03/09/2018   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/02/2018   Knee pain 10/31/2017   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 Left hip labral repair Left hip CAM debridement  SUBJECTIVE:   SUBJECTIVE STATEMENT:  Pt states he saw MD today for 2 wk f/u. Stitches removed. Cleared for progressive ROM but still wants to maintain flexion >90 precaution. Pt states that MD cleared him for FWB but to use crutches as needed.    Eval:  Pt has been compliant with WB precautions. Pt has been resting and protecting the hip appropriately. The lateral thigh on the L is aching and painful on occasion. Pt has been using aspirin and icing as directed. Denies NT. Denies s/s of infection. Pt states he has not been icing as much recently. Was not provided a brace post-op.   PERTINENT HISTORY: N/A PAIN:  Are you having pain? No: NPRS scale: 0/10 Pain location: L lateral Pain description: aching Aggravating factors: movement  Relieving factors: rest/ meds   PRECAUTIONS: Other: L hip labral repair  RED FLAGS: None   WEIGHT BEARING RESTRICTIONS: Yes TTWB for first two weeks  FALLS:  Has patient fallen in last 6 months? No  LIVING ENVIRONMENT: Lives with: lives with their family Lives in: House/apartment Stairs: 2nd story home Has following equipment at home: Crutches  OCCUPATION: N/A  Running- flat road with occasional trails/hills  PLOF: Independent  PATIENT GOALS: be able to run about 3 miles again     OBJECTIVE:  Note: Objective measures were completed at Evaluation unless otherwise noted.  DIAGNOSTIC FINDINGS: N/A  PATIENT SURVEYS:  FOTO 30 71 @ DC 19 pts MCII  COGNITION: Overall cognitive status: Within functional limits for tasks assessed                         SENSATION: WFL   MUSCLE LENGTH: Hamstrings: positive supine 90/90   POSTURE: No  Significant postural limitations   PALPATION: NT around incision sites, no erythema, no signs of drainage, Tegaderm bandage in place   LOWER EXTREMITY ROM:   Passive ROM Right eval Left eval  Hip flexion WFL 75  Hip extension WFL 0  Hip abduction WFL 30  Hip adduction      Hip internal rotation WFL 30  Hip external rotation WFL 20  Knee flexion WFL 130  Knee extension WFL 0  Ankle dorsiflexion      Ankle plantarflexion      Ankle inversion      Ankle eversion       (Blank rows = not tested)   LOWER EXTREMITY MMT: Not tested 2/2 surgical precautions   GAIT: Distance walked: 58ft Assistive device utilized: Crutches Level of assistance: Complete Independence Comments: TTWB     TODAY'S TREATMENT:                                                                                                                              DATE:   11/18  PROM to protocol limits   Bridge 3x10 HR 3x10   Gait training with single crutch for reduction of antalgic gait and sensitivity; stairs with single crutch; protocol precaution review Return to walking parameters   11/11 Bandage change and wound inspection    Exercises - Supine Posterior Pelvic Tilt  - 2-3 x daily - 7 x weekly - 2 sets - 10 reps - 2 hold - Hooklying Gluteal Sets  - 2-3 x daily - 7 x weekly - 3 sets - 10 reps - Clamshell  - 2-3 x daily - 7 x weekly - 2 sets - 10 reps - Ankle Pumps in Elevation  - 2-3 x daily - 7 x weekly - 2 sets - 20 reps  PATIENT EDUCATION:  Education details: surgical precautions, s/s of infection, diagnosis, prognosis, anatomy, exercise progression, DOMS expectations, muscle firing,  envelope of function, HEP, POC Person educated: Patient Education method: Explanation, Demonstration, Tactile cues, Verbal cues, and Handouts Education comprehension: verbalized understanding, returned demonstration, verbal cues required, and tactile cues required   HOME EXERCISE PROGRAM:   Access Code: [email or web address]  (prefers print) URL: [email or web address] Date: 10/06/2023 Prepared by: Zebedee Iba    ASSESSMENT:   CLINICAL IMPRESSION: Pt 2 wks post op. Pt saw MD and was cleared for FWB.  However, pt advised that his current gait pattern and future sensitivity will likely benefit from use of single crutch for a short period of time. Pt HEP progressed today without pain and pt able to return demo instruction of single crutch gait and stair management. Continue with protocol as tolerated. Pt would benefit from continued skilled therapy in order to reach goals and maximize functional L LE strength and ROM for full return to PLOF.    OBJECTIVE IMPAIRMENTS: decreased activity tolerance, decreased balance, decreased endurance, decreased mobility, decreased ROM, decreased strength, hypomobility, increased muscle spasms, impaired flexibility, improper body mechanics, postural dysfunction, and pain.    ACTIVITY LIMITATIONS: lifting, squatting, locomotion level, and dressing   PARTICIPATION LIMITATIONS: interpersonal relationship, community activity, and exercise   PERSONAL FACTORS: Past/current experiences and Time since onset of injury/illness/exacerbation are also affecting patient's functional outcome.    REHAB POTENTIAL: Good   CLINICAL DECISION MAKING: Stable/uncomplicated   EVALUATION COMPLEXITY: Low     GOALS:     SHORT TERM GOALS: Target date: 11/17/2023       Pt will become independent with HEP in order to demonstrate synthesis of PT education.   Goal status: INITIAL   2. Pt will be able to demonstrate full hip AROM in order to demonstrate functional improvement in LE function for self-care and house hold duties.      Goal status: INITIAL   3.  Pt will report at least 2 pt reduction on NPRS scale for pain in order to demonstrate functional improvement with household activity, self care, and ADL.    Goal status: INITIAL   LONG TERM GOALS: Target date: 12/29/2023          Pt  will become independent with final HEP in order to demonstrate synthesis of PT education.   Goal status: INITIAL   2.  Pt will score >/= 71 on FOTO to demonstrate improvement in perceived L hip function.      Goal status: INITIAL   3.  Pt will be able to demonstrate 20x 8" box step downs without form deviation or pain in order to demonstrate functional improvement in LE function for return to hopping/jumping.    Goal status: INITIAL   4.  Pt will be able to demonstrate 80% strength with HHD in order to demonstrate functional improvement and tolerance to return to running.    Goal status: INITIAL   5. Pt will be able to demonstrate ability to DL hop without pain in order to demonstrate functional improvement and tolerance to low level plyometric loading.     Goal status: INITIAL       PLAN:   PT FREQUENCY: 1-2x/week   PT DURATION: 12 weeks    PLANNED INTERVENTIONS: Therapeutic exercises, Therapeutic activity, Neuromuscular re-education, Balance training, Gait training, Patient/Family education, Self Care, Joint mobilization, Joint manipulation, Stair training, Aquatic Therapy, Dry Needling, Electrical stimulation, Spinal manipulation, Spinal mobilization, Cryotherapy, Moist heat, scar mobilization, Splintting, Taping, Vasopneumatic device, Traction, Ultrasound, Ionotophoresis 4mg /ml Dexamethasone, Manual therapy, and Re-evaluation   PLAN FOR NEXT SESSION: hip arthroscopy protocol; ROM, muscle activation; STM and joint mobs PRN    Zebedee Iba, PT 10/13/2023, 11:02 AM

## 2023-10-13 NOTE — Progress Notes (Signed)
Post Operative Evaluation    Procedure/Date of Surgery: Left hip arthroscopy with labral repair and cam debridement 11/4   Presents today 2 weeks status post the above procedure.  He has been compliant with nonweightbearing status.  He has been utilizing crutches.  He is having some soreness in the foot and calf as he has been placing his foot back down.  Overall he is doing quite well.  He has been enrolled in physical therapy   PMH/PSH/Family History/Social History/Meds/Allergies:    Past Medical History:  Diagnosis Date   Anxiety    Blood transfusion without reported diagnosis 2010   During Chemo Treatments   Hypothyroidism    Leukemia, acute myeloid, in remission (HCC) 2010   Thyroid disease    Past Surgical History:  Procedure Laterality Date   CYSTECTOMY Left    Scrotum   PORT-A-CATH REMOVAL     WISDOM TOOTH EXTRACTION     Social History   Socioeconomic History   Marital status: Married    Spouse name: Not on file   Number of children: 0   Years of education: college   Highest education level: Bachelor's degree (e.g., BA, AB, BS)  Occupational History   Occupation: Disabled  Tobacco Use   Smoking status: Never   Smokeless tobacco: Never  Vaping Use   Vaping status: Never Used  Substance and Sexual Activity   Alcohol use: Yes    Comment: "maybe one drink every two years"   Drug use: No   Sexual activity: Yes    Partners: Female  Other Topics Concern   Not on file  Social History Narrative   Lives at home with his wife.   Left-handed.   No daily caffeine use.    Social Determinants of Health   Financial Resource Strain: Low Risk  (02/14/2023)   Overall Financial Resource Strain (CARDIA)    Difficulty of Paying Living Expenses: Not hard at all  Food Insecurity: No Food Insecurity (02/14/2023)   Hunger Vital Sign    Worried About Running Out of Food in the Last Year: Never true    Ran Out of Food in the Last Year:  Never true  Transportation Needs: No Transportation Needs (02/14/2023)   PRAPARE - Administrator, Civil Service (Medical): No    Lack of Transportation (Non-Medical): No  Physical Activity: Insufficiently Active (02/14/2023)   Exercise Vital Sign    Days of Exercise per Week: 3 days    Minutes of Exercise per Session: 30 min  Stress: No Stress Concern Present (02/14/2023)   [employer] of Occupational Health - Occupational Stress Questionnaire    Feeling of Stress : Only a little  Social Connections: Unknown (02/14/2023)   Social Connection and Isolation Panel [NHANES]    Frequency of Communication with Friends and Family: Once a week    Frequency of Social Gatherings with Friends and Family: Patient declined    Attends Religious Services: Never    Database administrator or Organizations: No    Attends Engineer, structural: Not on file    Marital Status: Married   Family History  Problem Relation Age of Onset   Healthy Mother    Hypertension Father    Multiple sclerosis Maternal Grandmother    Dementia Maternal Grandmother    Diabetes Maternal  Grandfather    Heart attack Maternal Grandfather 72   Diabetes Paternal Grandfather    Breast cancer Maternal Aunt    Prostate cancer Neg Hx    Colon cancer Neg Hx    Allergies  Allergen Reactions   Topamax [Topiramate] Other (See Comments)    Anger/irritable   Trazodone And Nefazodone Nausea Only   Current Outpatient Medications  Medication Sig Dispense Refill   acetaminophen (TYLENOL) 500 MG tablet Take 1,000 mg by mouth every 6 (six) hours as needed.     diazepam (VALIUM) 5 MG tablet Take 1 tablet (5 mg total) by mouth every 12 (twelve) hours as needed for muscle spasms 30 tablet 0   ibuprofen (ADVIL) 200 MG tablet Take 200-400 mg by mouth every 6 (six) hours as needed for moderate pain.     levothyroxine (SYNTHROID) 150 MCG tablet Take 1 tablet (150 mcg total) by mouth daily. 90 tablet 1   Multiple  Vitamin (MULTIVITAMIN WITH MINERALS) TABS tablet Take 1 tablet by mouth daily.     oxyCODONE (ROXICODONE) 5 MG immediate release tablet Take 1 tablet (5 mg total) by mouth every 4 (four) hours as needed for severe pain (pain score 7-10) or breakthrough pain. 15 tablet 0   No current facility-administered medications for this visit.   No results found.  Review of Systems:   A ROS was performed including pertinent positives and negatives as documented in the HPI.   Musculoskeletal Exam:    There were no vitals taken for this visit.  Left hip with 30 degrees internal/external rotation of the hip without pain.  Active flexion of the left hip is to 90 degrees.  Mild Trendelenburg gait.  Walks with crutches.  Remainder of distal neurosensory exam is intact with 2+ dorsalis pedis pulse  Imaging:      I personally reviewed and interpreted the radiographs.   Assessment:   2 weeks status post left hip arthroscopy with cam debridement and labral repair overall doing very well.  At this time he will advance his weightbearing to as tolerated.  I will plan to see him back in 4 weeks for reassessment.  Plan :    -Return to clinic in 4 weeks for reassessment.      I personally saw and evaluated the patient, and participated in the management and treatment plan.  Huel Cote, MD Attending Physician, Orthopedic Surgery  This document was dictated using Dragon voice recognition software. A reasonable attempt at proof reading has been made to minimize errors.

## 2023-10-14 ENCOUNTER — Ambulatory Visit: Payer: 59 | Admitting: Sports Medicine

## 2023-10-20 ENCOUNTER — Encounter (HOSPITAL_BASED_OUTPATIENT_CLINIC_OR_DEPARTMENT_OTHER): Payer: Self-pay | Admitting: Physical Therapy

## 2023-10-20 ENCOUNTER — Ambulatory Visit (HOSPITAL_BASED_OUTPATIENT_CLINIC_OR_DEPARTMENT_OTHER): Payer: 59 | Admitting: Physical Therapy

## 2023-10-20 DIAGNOSIS — M25552 Pain in left hip: Secondary | ICD-10-CM

## 2023-10-20 DIAGNOSIS — R262 Difficulty in walking, not elsewhere classified: Secondary | ICD-10-CM | POA: Diagnosis not present

## 2023-10-20 DIAGNOSIS — M6281 Muscle weakness (generalized): Secondary | ICD-10-CM

## 2023-10-20 DIAGNOSIS — M25852 Other specified joint disorders, left hip: Secondary | ICD-10-CM | POA: Diagnosis not present

## 2023-10-20 NOTE — Therapy (Signed)
OUTPATIENT PHYSICAL THERAPY LOWER EXTREMITY EVALUATION   Patient Name: Zachary Little MRN: [id number] DOB:1981/04/29, 42 y.o., male Today's Date: 10/20/2023  END OF SESSION:  PT End of Session - 10/20/23 1009     Visit Number 3    Number of Visits 26    Date for PT Re-Evaluation 01/04/24    Authorization Type [facility] [employer]    PT Start Time 1015    PT Stop Time 1045    PT Time Calculation (min) 30 min    Activity Tolerance Patient tolerated treatment well    Behavior During Therapy [facility] for tasks assessed/performed              Past Medical History:  Diagnosis Date   Anxiety    Blood transfusion without reported diagnosis 2010   During Chemo Treatments   Hypothyroidism    Leukemia, acute myeloid, in remission (HCC) 2010   Thyroid disease    Past Surgical History:  Procedure Laterality Date   CYSTECTOMY Left    Scrotum   PORT-A-CATH REMOVAL     WISDOM TOOTH EXTRACTION     Patient Active Problem List   Diagnosis Date Noted   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   Chronic right hip pain 09/23/2023   Labral tear of left hip joint 10/01/2022   DDD (degenerative disc disease), cervical, 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 C6 foraminal stenosis with left-sided periscapular discomfort 04/23/2022   Pes anserinus bursitis of left knee 03/19/2022   Chronic pain syndrome 12/29/2019   Neuropathic pain of thigh, right 12/29/2019   Labral tear of shoulder, right, sequela 10/06/2019   Depression, major, single episode, 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   Erectile dysfunction 03/09/2018   Obesity 03/09/2018   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 Left hip labral repair Left hip CAM debridement  SUBJECTIVE:   SUBJECTIVE STATEMENT:  Pt is at 3 wks at this time. Pt did walk a lot over the weekend and had some mild groin and hip soreness. No pain. Walked close to 5 miles.   Eval:  Pt has been compliant with WB precautions. Pt has been resting and protecting the hip appropriately. The lateral thigh on the L is aching and painful on occasion. Pt has been using aspirin and icing as directed. Denies NT. Denies s/s of infection. Pt states he has not been icing as much recently. Was not provided a brace post-op.   PERTINENT HISTORY: N/A PAIN:  Are you having pain? No: NPRS scale: 0/10 Pain location: L lateral Pain description: aching Aggravating factors: movement  Relieving factors: rest/ meds   PRECAUTIONS: Other: L hip labral repair  RED FLAGS: None   WEIGHT BEARING RESTRICTIONS: Yes TTWB for first two weeks  FALLS:  Has patient fallen in last 6 months? No  LIVING ENVIRONMENT: Lives with: lives with their family Lives in: House/apartment Stairs: 2nd story home Has following equipment at home: Crutches  OCCUPATION: N/A  Running- flat road with  occasional trails/hills  PLOF: Independent  PATIENT GOALS: be able to run about 3 miles again     OBJECTIVE:  Note: Objective measures were completed at Evaluation unless otherwise noted.  DIAGNOSTIC FINDINGS: N/A  PATIENT SURVEYS:  FOTO 30 71 @ DC 19 pts MCII  COGNITION: Overall cognitive status: Within functional limits for tasks assessed                         SENSATION: WFL   MUSCLE LENGTH: Hamstrings: positive supine 90/90   POSTURE: No Significant postural limitations   PALPATION: NT around incision  sites, no erythema, no signs of drainage, Tegaderm bandage in place   LOWER EXTREMITY ROM:   Passive ROM Right eval Left eval  Hip flexion WFL 75  Hip extension WFL 0  Hip abduction WFL 30  Hip adduction      Hip internal rotation WFL 30  Hip external rotation WFL 20  Knee flexion WFL 130  Knee extension WFL 0  Ankle dorsiflexion      Ankle plantarflexion      Ankle inversion      Ankle eversion       (Blank rows = not tested)   LOWER EXTREMITY MMT: Not tested 2/2 surgical precautions   GAIT: Distance walked: 42ft Assistive device utilized: Crutches Level of assistance: Complete Independence Comments: TTWB     TODAY'S TREATMENT:                                                                                                                              DATE:   11/25   PROM hip IR/ER Manual quad stretch in prone 30s 5x  Supine SKTC 5s 15x  Supine butterfly 30s 3x Supine piriformis 30s 3x Standing adductor stretch 30s 3x  Bridge with RTB at knees 3x10 Clam with RTB 3x10 Prone hip ext 3x10 (2x with straight leg, 1x bent knee)   11/18  PROM to protocol limits   Bridge 3x10 HR 3x10   Gait training with single crutch for reduction of antalgic gait and sensitivity; stairs with single crutch; protocol precaution review Return to walking parameters   11/11 Bandage change and wound inspection    Exercises - Supine Posterior Pelvic Tilt  - 2-3 x daily - 7 x weekly - 2 sets - 10 reps - 2 hold - Hooklying Gluteal Sets  - 2-3 x daily - 7 x weekly - 3 sets - 10 reps - Clamshell  - 2-3 x daily - 7 x weekly - 2 sets - 10 reps - Ankle Pumps in Elevation  - 2-3 x daily - 7 x weekly - 2 sets - 20 reps  PATIENT EDUCATION:  Education details: surgical precautions, s/s of infection, diagnosis, prognosis, anatomy, exercise progression, DOMS expectations, muscle firing,  envelope of function, HEP, POC Person educated: Patient Education method: Explanation, Demonstration,  Tactile cues, Verbal cues, and Handouts Education comprehension: verbalized understanding, returned  demonstration, verbal cues required, and tactile cues required   HOME EXERCISE PROGRAM:   Access Code: [email or web address] (prefers print) URL: [email or web address] Date: 10/06/2023 Prepared by: Zebedee Iba    ASSESSMENT:   CLINICAL IMPRESSION: Pt now 3 wks post op and able to progress ROM. ROM restrictions lifted for pt and advised to slowly ease into stretching and slowly progress as he is able. HEP updated today to focus on mobility/flexibility in addition to progression of resisted exercise. L lateral hip soreness likely due to spike in walking/step count. Pt advised on use of thermotherapy in addition to new HEP. Demo of self STM also provided. Plan to continue with protocol with intro of [facility] at next. Pt progressing well. Recheck hip soreness and stiffness at next as pt does have mild antalgic gait. Pt would benefit from continued skilled therapy in order to reach goals and maximize functional L LE strength and ROM for full return to PLOF.    OBJECTIVE IMPAIRMENTS: decreased activity tolerance, decreased balance, decreased endurance, decreased mobility, decreased ROM, decreased strength, hypomobility, increased muscle spasms, impaired flexibility, improper body mechanics, postural dysfunction, and pain.    ACTIVITY LIMITATIONS: lifting, squatting, locomotion level, and dressing   PARTICIPATION LIMITATIONS: interpersonal relationship, community activity, and exercise   PERSONAL FACTORS: Past/current experiences and Time since onset of injury/illness/exacerbation are also affecting patient's functional outcome.    REHAB POTENTIAL: Good   CLINICAL DECISION MAKING: Stable/uncomplicated   EVALUATION COMPLEXITY: Low     GOALS:     SHORT TERM GOALS: Target date: 11/17/2023       Pt will become independent with HEP in order to demonstrate synthesis of PT education.   Goal status:  INITIAL   2. Pt will be able to demonstrate full hip AROM in order to demonstrate functional improvement in LE function for self-care and house hold duties.      Goal status: INITIAL   3.  Pt will report at least 2 pt reduction on NPRS scale for pain in order to demonstrate functional improvement with household activity, self care, and ADL.    Goal status: INITIAL   LONG TERM GOALS: Target date: 12/29/2023         Pt  will become independent with final HEP in order to demonstrate synthesis of PT education.   Goal status: INITIAL   2.  Pt will score >/= 71 on FOTO to demonstrate improvement in perceived L hip function.      Goal status: INITIAL   3.  Pt will be able to demonstrate 20x 8" box step downs without form deviation or pain in order to demonstrate functional improvement in LE function for return to hopping/jumping.    Goal status: INITIAL   4.  Pt will be able to demonstrate 80% strength with HHD in order to demonstrate functional improvement and tolerance to return to running.    Goal status: INITIAL   5. Pt will be able to demonstrate ability to DL hop without pain in order to demonstrate functional improvement and tolerance to low level plyometric loading.     Goal status: INITIAL       PLAN:   PT FREQUENCY: 1-2x/week   PT DURATION: 12 weeks    PLANNED INTERVENTIONS: Therapeutic exercises, Therapeutic activity, Neuromuscular re-education, Balance training, Gait training, Patient/Family education, Self Care, Joint mobilization, Joint manipulation, Stair training, Aquatic Therapy, Dry Needling, Electrical stimulation, Spinal manipulation, Spinal mobilization, Cryotherapy, Moist heat, scar mobilization, Splintting, Taping, Vasopneumatic device, Traction, Ultrasound, Ionotophoresis 4mg /ml Dexamethasone, Manual therapy, and  Re-evaluation   PLAN FOR NEXT SESSION: hip arthroscopy protocol; ROM, muscle activation; STM and joint mobs PRN    Zebedee Iba, PT 10/20/2023,  10:49 AM

## 2023-10-27 ENCOUNTER — Ambulatory Visit (HOSPITAL_BASED_OUTPATIENT_CLINIC_OR_DEPARTMENT_OTHER): Payer: 59 | Attending: Orthopaedic Surgery | Admitting: Physical Therapy

## 2023-10-27 ENCOUNTER — Encounter (HOSPITAL_BASED_OUTPATIENT_CLINIC_OR_DEPARTMENT_OTHER): Payer: Self-pay | Admitting: Physical Therapy

## 2023-10-27 DIAGNOSIS — M25552 Pain in left hip: Secondary | ICD-10-CM | POA: Insufficient documentation

## 2023-10-27 DIAGNOSIS — M6281 Muscle weakness (generalized): Secondary | ICD-10-CM | POA: Insufficient documentation

## 2023-10-27 DIAGNOSIS — R262 Difficulty in walking, not elsewhere classified: Secondary | ICD-10-CM | POA: Diagnosis not present

## 2023-10-27 NOTE — Therapy (Signed)
OUTPATIENT PHYSICAL THERAPY LOWER EXTREMITY EVALUATION   Patient Name: Aniello Servi MRN: [id number] DOB:07-27-1981, 42 y.o., male Today's Date: 10/27/2023  END OF SESSION:  PT End of Session - 10/27/23 1105     Visit Number 3    Number of Visits 26    Date for PT Re-Evaluation 01/04/24    Authorization Type [facility] [employer]    PT Start Time 1015    PT Stop Time 1058    PT Time Calculation (min) 43 min    Activity Tolerance Patient tolerated treatment well    Behavior During Therapy WFL for tasks assessed/performed               Past Medical History:  Diagnosis Date   Anxiety    Blood transfusion without reported diagnosis 2010   During Chemo Treatments   Hypothyroidism    Leukemia, acute myeloid, in remission (HCC) 2010   Thyroid disease    Past Surgical History:  Procedure Laterality Date   CYSTECTOMY Left    Scrotum   PORT-A-CATH REMOVAL     WISDOM TOOTH EXTRACTION     Patient Active Problem List   Diagnosis Date Noted   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   Chronic right hip pain 09/23/2023   Labral tear of left hip joint 10/01/2022   DDD (degenerative disc disease), cervical, 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 C6 foraminal stenosis with left-sided periscapular discomfort 04/23/2022   Pes anserinus bursitis of left knee 03/19/2022   Chronic pain syndrome 12/29/2019   Neuropathic pain of thigh, right 12/29/2019   Labral tear of shoulder, right, sequela 10/06/2019   Depression, major, single episode, 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   Erectile dysfunction 03/09/2018   Obesity 03/09/2018   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/02/2018   Knee pain 10/31/2017   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 Left hip labral repair Left hip CAM debridement  SUBJECTIVE:   SUBJECTIVE STATEMENT:  Pt is at 4 wks at this time. Pt states that he is having more quad pain, especially with the lateral and medial thigh. Pt states pain got up to a 6-7/10 with only ibuprofen reducing discomfort.    Eval:  Pt has been compliant with WB precautions. Pt has been resting and protecting the hip appropriately. The lateral thigh on the L is aching and painful on occasion. Pt has been using aspirin and icing as directed. Denies NT. Denies s/s of infection. Pt states he has not been icing as much recently. Was not provided a brace post-op.   PERTINENT HISTORY: N/A PAIN:  Are you having pain? No: NPRS scale: 0/10 Pain location: L lateral Pain description: aching Aggravating factors: movement  Relieving factors: rest/ meds   PRECAUTIONS: Other: L hip labral repair  RED FLAGS: None   WEIGHT BEARING RESTRICTIONS: Yes TTWB for first two weeks  FALLS:  Has patient fallen in last 6 months? No  LIVING ENVIRONMENT: Lives with: lives with their family Lives in: House/apartment Stairs: 2nd story home Has following equipment at home: Crutches  OCCUPATION: N/A  Running- flat road with occasional trails/hills  PLOF: Independent  PATIENT GOALS: be able to run about 3 miles again     OBJECTIVE:  Note: Objective measures were completed at Evaluation unless otherwise noted.  DIAGNOSTIC FINDINGS: N/A  PATIENT SURVEYS:  FOTO 30 71 @ DC 19 pts MCII  COGNITION: Overall cognitive status: Within functional limits for tasks assessed                         SENSATION: WFL   MUSCLE LENGTH: Hamstrings: positive supine 90/90   POSTURE: No Significant  postural limitations   PALPATION: NT around incision sites, no erythema, no signs of drainage, Tegaderm bandage in place   LOWER EXTREMITY ROM:   Passive ROM Right eval Left eval  Hip flexion WFL 75  Hip extension WFL 0  Hip abduction WFL 30  Hip adduction      Hip internal rotation WFL 30  Hip external rotation WFL 20  Knee flexion WFL 130  Knee extension WFL 0  Ankle dorsiflexion      Ankle plantarflexion      Ankle inversion      Ankle eversion       (Blank rows = not tested)   LOWER EXTREMITY MMT: Not tested 2/2 surgical precautions   GAIT: Distance walked: 47ft Assistive device utilized: Crutches Level of assistance: Complete Independence Comments: TTWB     TODAY'S TREATMENT:                                                                                                                              DATE:   12/2  STM L quad: VL and rec fem focus in particular  Self foam rolling for quad Prone quad stretch 30s 3x Seated QL stretch with L leg cross 30s 2x (recreates   TPDN edu  11/25   PROM hip IR/ER Manual quad stretch in prone 30s 5x  Supine SKTC 5s 15x  Supine butterfly 30s 3x Supine piriformis 30s 3x Standing adductor stretch 30s 3x  Bridge with RTB at knees 3x10 Clam with RTB 3x10 Prone hip ext 3x10 (2x with straight leg, 1x bent knee)   11/18  PROM to protocol limits   Bridge 3x10 HR 3x10   Gait training with single crutch for reduction of antalgic gait and sensitivity; stairs with single crutch; protocol precaution review Return to walking parameters   11/11 Bandage change and wound inspection    Exercises - Supine Posterior Pelvic Tilt  - 2-3 x daily - 7 x weekly - 2 sets - 10 reps - 2 hold - Hooklying Gluteal Sets  - 2-3 x daily - 7 x weekly - 3 sets - 10 reps - Clamshell  - 2-3 x daily - 7 x weekly - 2 sets - 10 reps - Ankle Pumps in Elevation  - 2-3 x daily - 7 x weekly - 2 sets - 20 reps  PATIENT EDUCATION:  Education  details: surgical precautions, s/s of infection, diagnosis, prognosis, anatomy, exercise progression, DOMS expectations, muscle firing,  envelope of function, HEP, POC Person educated: Patient Education method: Explanation, Demonstration, Tactile cues, Verbal cues, and Handouts Education comprehension: verbalized understanding, returned demonstration, verbal cues required, and tactile cues required   HOME EXERCISE PROGRAM:   Access Code: [email or web address] (prefers print) URL: [email or web address] Date: 10/06/2023 Prepared by: Zebedee Iba    ASSESSMENT:   CLINICAL IMPRESSION: Pt now 4 wks post op and able to progress ROM. Previous hip soreness from increase in walking has resolved but pt today complains of L anterior thigh pain that appears consistent with significant hypertonicity of L rectus femoris and vastus lateralis. There is likely lateral hip stiffness as well as figure 4 stretch increases down to lateral knee and shank. Pt with mild improvement in soft tissue extensibility with manual and advised on potential use of TPDN at future sessions if self massage, massage therapy, and stretching do not improve. Consider check of lumbar spine as well. Plan to continue with protocol with intro of [facility] at next. Pt would benefit from continued skilled therapy in order to reach goals and maximize functional L LE strength and ROM for full return to PLOF.    OBJECTIVE IMPAIRMENTS: decreased activity tolerance, decreased balance, decreased endurance, decreased mobility, decreased ROM, decreased strength, hypomobility, increased muscle spasms, impaired flexibility, improper body mechanics, postural dysfunction, and pain.    ACTIVITY LIMITATIONS: lifting, squatting, locomotion level, and dressing   PARTICIPATION LIMITATIONS: interpersonal relationship, community activity, and exercise   PERSONAL FACTORS: Past/current experiences and Time since onset of injury/illness/exacerbation are also affecting  patient's functional outcome.    REHAB POTENTIAL: Good   CLINICAL DECISION MAKING: Stable/uncomplicated   EVALUATION COMPLEXITY: Low     GOALS:     SHORT TERM GOALS: Target date: 11/17/2023       Pt will become independent with HEP in order to demonstrate synthesis of PT education.   Goal status: INITIAL   2. Pt will be able to demonstrate full hip AROM in order to demonstrate functional improvement in LE function for self-care and house hold duties.      Goal status: INITIAL   3.  Pt will report at least 2 pt reduction on NPRS scale for pain in order to demonstrate functional improvement with household activity, self care, and ADL.    Goal status: INITIAL   LONG TERM GOALS: Target date: 12/29/2023         Pt  will become independent with final HEP in order to demonstrate synthesis of PT education.   Goal status: INITIAL   2.  Pt will score >/= 71 on FOTO to demonstrate improvement in perceived L hip function.      Goal status: INITIAL   3.  Pt will be able to demonstrate 20x 8" box step downs without form deviation or pain in order to demonstrate functional improvement in LE function for return to hopping/jumping.    Goal status: INITIAL   4.  Pt will be able to demonstrate 80% strength with HHD in order to demonstrate functional improvement and tolerance to return to running.    Goal status: INITIAL   5. Pt will be able to demonstrate ability to DL hop without pain in order to demonstrate functional improvement and tolerance to low level plyometric loading.     Goal status: INITIAL       PLAN:   PT FREQUENCY: 1-2x/week   PT DURATION: 12  weeks    PLANNED INTERVENTIONS: Therapeutic exercises, Therapeutic activity, Neuromuscular re-education, Balance training, Gait training, Patient/Family education, Self Care, Joint mobilization, Joint manipulation, Stair training, Aquatic Therapy, Dry Needling, Electrical stimulation, Spinal manipulation, Spinal  mobilization, Cryotherapy, Moist heat, scar mobilization, Splintting, Taping, Vasopneumatic device, Traction, Ultrasound, Ionotophoresis 4mg /ml Dexamethasone, Manual therapy, and Re-evaluation   PLAN FOR NEXT SESSION: hip arthroscopy protocol; ROM, muscle activation; STM and joint mobs PRN    Zebedee Iba, PT 10/27/2023, 11:17 AM

## 2023-10-28 ENCOUNTER — Encounter: Payer: Self-pay | Admitting: Physician Assistant

## 2023-10-28 ENCOUNTER — Ambulatory Visit (INDEPENDENT_AMBULATORY_CARE_PROVIDER_SITE_OTHER): Payer: 59 | Admitting: Physician Assistant

## 2023-10-28 VITALS — BP 110/70 | HR 74 | Temp 97.8°F | Ht 68.0 in | Wt 186.0 lb

## 2023-10-28 DIAGNOSIS — R519 Headache, unspecified: Secondary | ICD-10-CM

## 2023-10-28 DIAGNOSIS — Z0000 Encounter for general adult medical examination without abnormal findings: Secondary | ICD-10-CM | POA: Diagnosis not present

## 2023-10-28 DIAGNOSIS — K5900 Constipation, unspecified: Secondary | ICD-10-CM | POA: Diagnosis not present

## 2023-10-28 DIAGNOSIS — E663 Overweight: Secondary | ICD-10-CM

## 2023-10-28 DIAGNOSIS — E039 Hypothyroidism, unspecified: Secondary | ICD-10-CM | POA: Diagnosis not present

## 2023-10-28 LAB — LIPID PANEL
Cholesterol: 162 mg/dL (ref 0–200)
HDL: 50 mg/dL (ref >39.00)
LDL Cholesterol: 90 mg/dL (ref 0–99)
NonHDL: 112.22
Total CHOL/HDL Ratio: 3
Triglycerides: 111 mg/dL (ref 0.0–149.0)
VLDL: 22.2 mg/dL (ref 0.0–40.0)

## 2023-10-28 LAB — CBC WITH DIFFERENTIAL/PLATELET
Basophils Absolute: 0 10*3/uL (ref 0.0–0.1)
Basophils Relative: 0.7 % (ref 0.0–3.0)
Eosinophils Absolute: 0.1 10*3/uL (ref 0.0–0.7)
Eosinophils Relative: 2.1 % (ref 0.0–5.0)
HCT: 48.7 % (ref 39.0–52.0)
Hemoglobin: 16.3 g/dL (ref 13.0–17.0)
Lymphocytes Relative: 32.5 % (ref 12.0–46.0)
Lymphs Abs: 1.9 10*3/uL (ref 0.7–4.0)
MCHC: 33.6 g/dL (ref 30.0–36.0)
MCV: 97.4 fL (ref 78.0–100.0)
Monocytes Absolute: 0.7 10*3/uL (ref 0.1–1.0)
Monocytes Relative: 11.5 % (ref 3.0–12.0)
Neutro Abs: 3.1 10*3/uL (ref 1.4–7.7)
Neutrophils Relative %: 53.2 % (ref 43.0–77.0)
Platelets: 208 10*3/uL (ref 150.0–400.0)
RBC: 5 Mil/uL (ref 4.22–5.81)
RDW: 13.9 % (ref 11.5–15.5)
WBC: 5.8 10*3/uL (ref 4.0–10.5)

## 2023-10-28 LAB — COMPREHENSIVE METABOLIC PANEL
ALT: 25 U/L (ref 0–53)
AST: 18 U/L (ref 0–37)
Albumin: 4.3 g/dL (ref 3.5–5.2)
Alkaline Phosphatase: 52 U/L (ref 39–117)
BUN: 18 mg/dL (ref 6–23)
CO2: 29 meq/L (ref 19–32)
Calcium: 9.5 mg/dL (ref 8.4–10.5)
Chloride: 105 meq/L (ref 96–112)
Creatinine, Ser: 0.95 mg/dL (ref 0.40–1.50)
GFR: 99.46 mL/min (ref >60.00)
Glucose, Bld: 88 mg/dL (ref 70–99)
Potassium: 4.1 meq/L (ref 3.5–5.1)
Sodium: 140 meq/L (ref 135–145)
Total Bilirubin: 0.7 mg/dL (ref 0.2–1.2)
Total Protein: 6.9 g/dL (ref 6.0–8.3)

## 2023-10-28 LAB — TSH: TSH: 1.31 u[IU]/mL (ref 0.35–5.50)

## 2023-10-28 NOTE — Patient Instructions (Addendum)
It was great to see you!  Please try to get to the eye doctor, keep me posted on your headaches Review the constipation handout and work on better bowel health as able  Please go to the lab for blood work.   Our office will call you with your results unless you have chosen to receive results via MyChart.  If your blood work is normal we will follow-up each year for physicals and as scheduled for chronic medical problems.  If anything is abnormal we will treat accordingly and get you in for a follow-up.  Take care,  Lelon Mast

## 2023-10-28 NOTE — Progress Notes (Signed)
Zachary Little is a 42 y.o. male and is here for a comprehensive physical exam.  HPI  Acute Concerns: Headaches: Reports having some headaches since getting a new phone this year.  States he does plan on talking to his eye doctor about this, but otherwise uses a filter on his screen that mildly helps. Notes his headache doesn't usually need medicating but does occasionally take a tylenol if headache becomes severe.   Constipation: Reports that he's been experiencing constipation for the past year.  States he did try stool softened but couldn't tolerate them as they caused GI upset.  Does note that his diet this year has been different.  Also says he doesn't feel like he's developing hemorrhoids from straining.   Chronic Issues: Hypothyroidism: Managed with 150 mcg Levothyroxine.  Had a left hip arthroscopy on 11/04. He reports that he is healing well and continues to go to PT.  States that he may have more surgeries in the upcoming year.   Health Maintenance: Immunizations -- UpToDate  Colonoscopy -- N/A PSA -- No results found for: "PSA1", "PSA" Diet -- Healthy overall Sleep habits -- Good sleep quality Exercise -- Regular exercise (limited post surgery): walking   Weight -- Recent weight history Wt Readings from Last 10 Encounters:  10/28/23 186 lb (84.4 kg)  09/29/23 183 lb (83 kg)  09/16/23 182 lb 11.2 oz (82.9 kg)  10/01/22 205 lb (93 kg)  09/25/22 209 lb (94.8 kg)  05/29/22 210 lb 6.1 oz (95.4 kg)  03/28/22 205 lb (93 kg)  03/13/22 205 lb 9.6 oz (93.3 kg)  11/14/21 200 lb (90.7 kg)  11/05/21 203 lb (92.1 kg)   Body mass index is 28.28 kg/m.  Mood -- Stable Alcohol use --  reports current alcohol use.  Tobacco use --  Tobacco Use: Low Risk  (10/28/2023)   Patient History    Smoking Tobacco Use: Never    Smokeless Tobacco Use: Never    Passive Exposure: Not on file    Eligible for Low Dose CT?  UTD with eye doctor? No UTD with dentist?  Yes Established/UTD with dermatology? - Yes     10/28/2023    8:43 AM  Depression screen PHQ 2/9  Decreased Interest 0  Down, Depressed, Hopeless 0  PHQ - 2 Score 0    Other providers/specialists: Patient Care Team: Jarold Motto, [place] as PCP - General (Physician Assistant) Sedalia Muta, PT as Physical Therapist (Physical Therapy)   PMHx, SurgHx, SocialHx, Medications, and Allergies were reviewed in the Visit Navigator and updated as appropriate.   Past Medical History:  Diagnosis Date   Anxiety    Blood transfusion without reported diagnosis 2010   During Chemo Treatments   Hypothyroidism    Leukemia, acute myeloid, in remission (HCC) 2010   Thyroid disease     Past Surgical History:  Procedure Laterality Date   CYSTECTOMY Left    Scrotum   PORT-A-CATH REMOVAL     WISDOM TOOTH EXTRACTION     Family History  Problem Relation Age of Onset   Healthy Mother    Hypertension Father    Multiple sclerosis Maternal Grandmother    Dementia Maternal Grandmother    Diabetes Maternal Grandfather    Heart attack Maternal Grandfather 70   Diabetes Paternal Grandfather    Breast cancer Maternal Aunt    Prostate cancer Neg Hx    Colon cancer Neg Hx    Social History   Tobacco Use   Smoking status: Never  Smokeless tobacco: Never  Vaping Use   Vaping status: Never Used  Substance Use Topics   Alcohol use: Yes    Comment: "maybe one drink every two years"   Drug use: No   Review of Systems:   Review of Systems  Constitutional:  Negative for chills, fever, malaise/fatigue and weight loss.  HENT:  Negative for hearing loss, sinus pain and sore throat.   Respiratory:  Negative for cough and hemoptysis.   Cardiovascular:  Negative for chest pain, palpitations, leg swelling and PND.  Gastrointestinal:  Negative for abdominal pain, constipation, diarrhea, heartburn, nausea and vomiting.  Genitourinary:  Negative for dysuria, frequency and urgency.  Musculoskeletal:   Negative for back pain, myalgias and neck pain.  Skin:  Negative for itching and rash.  Neurological:  Negative for dizziness, tingling, seizures and headaches.  Endo/Heme/Allergies:  Negative for polydipsia.  Psychiatric/Behavioral:  Negative for depression. The patient is not nervous/anxious.      Objective:    Vitals:   10/28/23 0843  BP: 110/70  Pulse: 74  Temp: 97.8 F (36.6 C)  SpO2: 98%    Body mass index is 28.28 kg/m.  General  Alert, cooperative, no distress, appears stated age  Head:  Normocephalic, without obvious abnormality, atraumatic  Eyes:  PERRL, conjunctiva/corneas clear, EOM's intact, fundi benign, both eyes       Ears:  Normal TM's and external ear canals, both ears  Nose: Nares normal, septum midline, mucosa normal, no drainage or sinus tenderness  Throat: Lips, mucosa, and tongue normal; teeth and gums normal  Neck: Supple, symmetrical, trachea midline, no adenopathy;     thyroid:  No enlargement/tenderness/nodules; no carotid bruit or JVD  Back:   Symmetric, no curvature, ROM normal, no CVA tenderness  Lungs:   Clear to auscultation bilaterally, respirations unlabored  Chest wall:  No tenderness or deformity  Heart:  Regular rate and rhythm, S1 and S2 normal, no murmur, rub or gallop  Abdomen:   Soft, non-tender, bowel sounds active all four quadrants, no masses, no organomegaly  Extremities: Extremities normal, atraumatic, no cyanosis or edema  Prostate : Deferred   Skin: Skin color, texture, turgor normal, no rashes or lesions  Lymph nodes: Cervical, supraclavicular, and axillary nodes normal  Neurologic: CNII-XII grossly intact. Normal strength, sensation and reflexes throughout   AssessmentPlan:   Routine physical examination Today patient counseled on age appropriate routine health concerns for screening and prevention, each reviewed and up to date or declined. Immunizations reviewed and up to date or declined. Labs ordered and reviewed. Risk  factors for depression reviewed and negative. Hearing function and visual acuity are intact. ADLs screened and addressed as needed. Functional ability and level of safety reviewed and appropriate. Education, counseling and referrals performed based on assessed risks today. Patient provided with a copy of personalized plan for preventive services.  Hypothyroidism (acquired) Update TSH and adjust levothyroxine 150 mcg daily Follow-up in 1 year, sooner if abnormal results  Overweight Continue efforts at healthy lifestyle  Frequent headaches No red flags Neuro exam wnl He is planning to see an eye doctor next year and will follow-up with [place] if needed If new/worsening symptom(s) in the meantime, recommend close follow-up  Constipation, unspecified constipation type No red flags Constipation handout provided Recommend miralax 1 capful as needed or daily based on tolerance Follow-up if new/worsening/lack of improvement   I,Emily Lagle,acting as a scribe for [employer], PA.,have documented all relevant documentation on the behalf of Jarold Motto, PA,as directed by  Jarold Motto, PA while in the presence of Jarold Motto, [place].  I, Jarold Motto, [place], have reviewed all documentation for this visit. The documentation on 10/28/23 for the exam, diagnosis, procedures, and orders are all accurate and complete.  Jarold Motto, PA-C [place] [facility]

## 2023-10-29 ENCOUNTER — Other Ambulatory Visit (HOSPITAL_BASED_OUTPATIENT_CLINIC_OR_DEPARTMENT_OTHER): Payer: Self-pay

## 2023-10-29 ENCOUNTER — Other Ambulatory Visit: Payer: Self-pay | Admitting: Physician Assistant

## 2023-10-29 MED ORDER — LEVOTHYROXINE SODIUM 150 MCG PO TABS
150.0000 ug | ORAL_TABLET | Freq: Every day | ORAL | 3 refills | Status: DC
  Filled 2023-10-29 – 2023-11-22 (×2): qty 90, 90d supply, fill #0
  Filled 2024-02-20: qty 90, 90d supply, fill #1
  Filled 2024-05-25: qty 90, 90d supply, fill #2
  Filled 2024-08-18: qty 90, 90d supply, fill #3

## 2023-10-30 ENCOUNTER — Encounter (HOSPITAL_BASED_OUTPATIENT_CLINIC_OR_DEPARTMENT_OTHER): Payer: Self-pay

## 2023-10-30 ENCOUNTER — Ambulatory Visit (HOSPITAL_BASED_OUTPATIENT_CLINIC_OR_DEPARTMENT_OTHER): Payer: 59

## 2023-10-30 DIAGNOSIS — R262 Difficulty in walking, not elsewhere classified: Secondary | ICD-10-CM

## 2023-10-30 DIAGNOSIS — M6281 Muscle weakness (generalized): Secondary | ICD-10-CM | POA: Diagnosis not present

## 2023-10-30 DIAGNOSIS — M25552 Pain in left hip: Secondary | ICD-10-CM | POA: Diagnosis not present

## 2023-10-30 NOTE — Therapy (Signed)
OUTPATIENT PHYSICAL THERAPY LOWER EXTREMITY EVALUATION   Patient Name: Zachary Little MRN: [id number] DOB:1980-12-09, 42 y.o., male Today's Date: 10/30/2023  END OF SESSION:  PT End of Session - 10/30/23 1343     Visit Number 4    Number of Visits 26    Date for PT Re-Evaluation 01/04/24    Authorization Type [facility] [employer]    PT Start Time 1350    PT Stop Time 1430    PT Time Calculation (min) 40 min    Activity Tolerance Patient tolerated treatment well    Behavior During Therapy WFL for tasks assessed/performed                Past Medical History:  Diagnosis Date   Anxiety    Blood transfusion without reported diagnosis 2010   During Chemo Treatments   Hypothyroidism    Leukemia, acute myeloid, in remission (HCC) 2010   Thyroid disease    Past Surgical History:  Procedure Laterality Date   CYSTECTOMY Left    Scrotum   PORT-A-CATH REMOVAL     WISDOM TOOTH EXTRACTION     Patient Active Problem List   Diagnosis Date Noted   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   Chronic right hip pain 09/23/2023   Labral tear of left hip joint 10/01/2022   DDD (degenerative disc disease), cervical, 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 C6 foraminal stenosis with left-sided periscapular discomfort 04/23/2022   Pes anserinus bursitis of left knee 03/19/2022   Chronic pain syndrome 12/29/2019   Neuropathic pain of thigh, right 12/29/2019   Labral tear of shoulder, right, sequela 10/06/2019   Depression, major, single episode, 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   Erectile dysfunction 03/09/2018   Obesity 03/09/2018   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/02/2018   Knee pain 10/31/2017   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 Left hip labral repair Left hip CAM debridement  SUBJECTIVE:   SUBJECTIVE STATEMENT:  Pt reports hip has been doing well, occasional pinching. Improving quad pain, though still present.   Eval:  Pt has been compliant with WB precautions. Pt has been resting and protecting the hip appropriately. The lateral thigh on the L is aching and painful on occasion. Pt has been using aspirin and icing as directed. Denies NT. Denies s/s of infection. Pt states he has not been icing as much recently. Was not provided a brace post-op.   PERTINENT HISTORY: N/A PAIN:  Are you having pain? No: NPRS scale: 0/10 Pain location: L lateral Pain description: aching Aggravating factors: movement  Relieving factors: rest/ meds   PRECAUTIONS: Other: L hip labral repair  RED FLAGS: None   WEIGHT BEARING RESTRICTIONS: Yes TTWB for first two weeks  FALLS:  Has patient fallen in last 6 months? No  LIVING ENVIRONMENT: Lives with: lives with their family Lives in: House/apartment Stairs: 2nd story home Has following equipment at home: Crutches  OCCUPATION: N/A  Running- flat road with occasional trails/hills  PLOF: Independent  PATIENT GOALS: be able to run about 3  miles again     OBJECTIVE:  Note: Objective measures were completed at Evaluation unless otherwise noted.  DIAGNOSTIC FINDINGS: N/A  PATIENT SURVEYS:  FOTO 30 71 @ DC 19 pts MCII  COGNITION: Overall cognitive status: Within functional limits for tasks assessed                         SENSATION: WFL   MUSCLE LENGTH: Hamstrings: positive supine 90/90   POSTURE: No Significant postural limitations   PALPATION: NT around incision sites, no erythema, no signs of drainage,  Tegaderm bandage in place   LOWER EXTREMITY ROM:   Passive ROM Right eval Left eval  Hip flexion WFL 75  Hip extension WFL 0  Hip abduction WFL 30  Hip adduction      Hip internal rotation WFL 30  Hip external rotation WFL 20  Knee flexion WFL 130  Knee extension WFL 0  Ankle dorsiflexion      Ankle plantarflexion      Ankle inversion      Ankle eversion       (Blank rows = not tested)   LOWER EXTREMITY MMT: Not tested 2/2 surgical precautions   GAIT: Distance walked: 72ft Assistive device utilized: Crutches Level of assistance: Complete Independence Comments: TTWB     TODAY'S TREATMENT:                                                                                                                              DATE:   12/5 PROM L hip STM L quads and hip flexors Thomas stretch modified Bridges x20 Prone hip extension 2# 2x10 Prone HSC 4# 2x10 LAQ 4# 5" 2x10 Side stepping at rail x 4 laps Retro walking at rail x4 laps Partial squats at rail 2x10     12/2  STM L quad: VL and rec fem focus in particular  Self foam rolling for quad Prone quad stretch 30s 3x Seated QL stretch with L leg cross 30s 2x (recreates   TPDN edu  11/25   PROM hip IR/ER Manual quad stretch in prone 30s 5x  Supine SKTC 5s 15x  Supine butterfly 30s 3x Supine piriformis 30s 3x Standing adductor stretch 30s 3x  Bridge with RTB at knees 3x10 Clam with RTB 3x10 Prone hip ext 3x10 (2x with straight leg, 1x bent knee)   11/18  PROM to protocol limits   Bridge 3x10 HR 3x10   Gait training with single crutch for reduction of antalgic gait and sensitivity; stairs with single crutch; protocol precaution review Return to walking parameters   11/11 Bandage change and wound inspection    Exercises - Supine Posterior Pelvic Tilt  - 2-3 x daily - 7 x weekly - 2 sets - 10 reps - 2 hold - Hooklying Gluteal Sets  - 2-3 x daily - 7 x weekly - 3 sets - 10 reps - Clamshell  - 2-3  x daily -  7 x weekly - 2 sets - 10 reps - Ankle Pumps in Elevation  - 2-3 x daily - 7 x weekly - 2 sets - 20 reps  PATIENT EDUCATION:  Education details: surgical precautions, s/s of infection, diagnosis, prognosis, anatomy, exercise progression, DOMS expectations, muscle firing,  envelope of function, HEP, POC Person educated: Patient Education method: Explanation, Demonstration, Tactile cues, Verbal cues, and Handouts Education comprehension: verbalized understanding, returned demonstration, verbal cues required, and tactile cues required   HOME EXERCISE PROGRAM:   Access Code: [email or web address] (prefers print) URL: [email or web address] Date: 10/06/2023 Prepared by: Zebedee Iba    ASSESSMENT:   CLINICAL IMPRESSION: Pt with limited available hip IR with PROM. Also noted tightness in end range hip flexion. He is mildly tender to deep palpation of lateral quads and to mid rectus femoris. Also tender at proximal hip flexors just medial to ASIS. Will continue to monitor these areas for improvements. Good tolerance for therex today without c/o pain, only mild discomfort occasionally  in medial groin region. Progress per protocol.    OBJECTIVE IMPAIRMENTS: decreased activity tolerance, decreased balance, decreased endurance, decreased mobility, decreased ROM, decreased strength, hypomobility, increased muscle spasms, impaired flexibility, improper body mechanics, postural dysfunction, and pain.    ACTIVITY LIMITATIONS: lifting, squatting, locomotion level, and dressing   PARTICIPATION LIMITATIONS: interpersonal relationship, community activity, and exercise   PERSONAL FACTORS: Past/current experiences and Time since onset of injury/illness/exacerbation are also affecting patient's functional outcome.    REHAB POTENTIAL: Good   CLINICAL DECISION MAKING: Stable/uncomplicated   EVALUATION COMPLEXITY: Low     GOALS:     SHORT TERM GOALS: Target date: 11/17/2023       Pt will  become independent with HEP in order to demonstrate synthesis of PT education.   Goal status: INITIAL   2. Pt will be able to demonstrate full hip AROM in order to demonstrate functional improvement in LE function for self-care and house hold duties.      Goal status: INITIAL   3.  Pt will report at least 2 pt reduction on NPRS scale for pain in order to demonstrate functional improvement with household activity, self care, and ADL.    Goal status: INITIAL   LONG TERM GOALS: Target date: 12/29/2023         Pt  will become independent with final HEP in order to demonstrate synthesis of PT education.   Goal status: INITIAL   2.  Pt will score >/= 71 on FOTO to demonstrate improvement in perceived L hip function.      Goal status: INITIAL   3.  Pt will be able to demonstrate 20x 8" box step downs without form deviation or pain in order to demonstrate functional improvement in LE function for return to hopping/jumping.    Goal status: INITIAL   4.  Pt will be able to demonstrate 80% strength with HHD in order to demonstrate functional improvement and tolerance to return to running.    Goal status: INITIAL   5. Pt will be able to demonstrate ability to DL hop without pain in order to demonstrate functional improvement and tolerance to low level plyometric loading.     Goal status: INITIAL       PLAN:   PT FREQUENCY: 1-2x/week   PT DURATION: 12 weeks    PLANNED INTERVENTIONS: Therapeutic exercises, Therapeutic activity, Neuromuscular re-education, Balance training, Gait training, Patient/Family education, Self Care, Joint mobilization, Joint manipulation, Stair training, Aquatic Therapy, Dry Needling, Electrical stimulation, Spinal manipulation, Spinal mobilization,  Cryotherapy, Moist heat, scar mobilization, Splintting, Taping, Vasopneumatic device, Traction, Ultrasound, Ionotophoresis 4mg /ml Dexamethasone, Manual therapy, and Re-evaluation   PLAN FOR NEXT SESSION: hip  arthroscopy protocol; ROM, muscle activation; STM and joint mobs PRN    Donnel Saxon Jaydrian Corpening, PTA 10/30/2023, 2:37 PM

## 2023-11-03 ENCOUNTER — Encounter (HOSPITAL_BASED_OUTPATIENT_CLINIC_OR_DEPARTMENT_OTHER): Payer: Self-pay

## 2023-11-03 ENCOUNTER — Ambulatory Visit (HOSPITAL_BASED_OUTPATIENT_CLINIC_OR_DEPARTMENT_OTHER): Payer: 59

## 2023-11-03 DIAGNOSIS — M6281 Muscle weakness (generalized): Secondary | ICD-10-CM | POA: Diagnosis not present

## 2023-11-03 DIAGNOSIS — M25552 Pain in left hip: Secondary | ICD-10-CM | POA: Diagnosis not present

## 2023-11-03 DIAGNOSIS — R262 Difficulty in walking, not elsewhere classified: Secondary | ICD-10-CM | POA: Diagnosis not present

## 2023-11-03 NOTE — Therapy (Signed)
OUTPATIENT PHYSICAL THERAPY LOWER EXTREMITY TREATMENT   Patient Name: Zachary Little MRN: [id number] DOB:[birth year], 42 y.o., male Today's Date: 11/03/2023  END OF SESSION:  PT End of Session - 11/03/23 1213     Visit Number 5    Number of Visits 26    Date for PT Re-Evaluation 01/04/24    PT Start Time 1150    PT Stop Time 1230    PT Time Calculation (min) 40 min    Activity Tolerance Patient tolerated treatment well    Behavior During Therapy [facility] for tasks assessed/performed                 Past Medical History:  Diagnosis Date   Anxiety    Blood transfusion without reported diagnosis 2010   During Chemo Treatments   Hypothyroidism    Leukemia, acute myeloid, in remission (HCC) 2010   Thyroid disease    Past Surgical History:  Procedure Laterality Date   CYSTECTOMY Left    Scrotum   PORT-A-CATH REMOVAL     WISDOM TOOTH EXTRACTION     Patient Active Problem List   Diagnosis Date Noted   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   Chronic right hip pain 09/23/2023   Labral tear of left hip joint 10/01/2022   DDD (degenerative disc disease), cervical, 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 C6 foraminal stenosis with left-sided periscapular discomfort 04/23/2022   Pes anserinus bursitis of left knee 03/19/2022   Chronic pain syndrome 12/29/2019   Neuropathic pain of thigh, right 12/29/2019   Labral tear of shoulder, right, sequela 10/06/2019   Depression, major, single episode, 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   Erectile dysfunction 03/09/2018   Obesity 03/09/2018   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 Left hip labral repair Left hip CAM debridement  SUBJECTIVE:   SUBJECTIVE STATEMENT:  Pt reports improved hip pain. Continues to have some groin tightness, but improving.   Eval:  Pt has been compliant with WB precautions. Pt has been resting and protecting the hip appropriately. The lateral thigh on the L is aching and painful on occasion. Pt has been using aspirin and icing as directed. Denies NT. Denies s/s of infection. Pt states he has not been icing as much recently. Was not provided a brace post-op.   PERTINENT HISTORY: N/A PAIN:  Are you having pain? No: NPRS scale: 0/10 Pain location: L lateral Pain description: aching Aggravating factors: movement  Relieving factors: rest/ meds   PRECAUTIONS: Other: L hip labral repair  RED FLAGS: None   WEIGHT BEARING RESTRICTIONS: Yes TTWB for first two weeks  FALLS:  Has patient fallen in last 6 months? No  LIVING ENVIRONMENT: Lives with: lives with their family Lives in: House/apartment Stairs: 2nd story home Has following equipment at home: Crutches  OCCUPATION: N/A  Running- flat road with occasional trails/hills  PLOF: Independent  PATIENT GOALS: be able to run about 3 miles again     OBJECTIVE:  Note: Objective measures were completed at Evaluation unless otherwise noted.  DIAGNOSTIC FINDINGS: N/A  PATIENT SURVEYS:  FOTO 30 71 @ DC 19 pts MCII  COGNITION: Overall cognitive status: Within functional limits for tasks assessed                         SENSATION: WFL   MUSCLE LENGTH: Hamstrings: positive supine 90/90   POSTURE: No Significant postural limitations   PALPATION: NT around incision sites, no erythema, no signs of drainage, Tegaderm bandage in place   LOWER EXTREMITY  ROM:   Passive ROM Right eval Left eval  Hip flexion WFL 75  Hip extension WFL 0  Hip abduction WFL 30  Hip adduction      Hip internal rotation WFL 30  Hip external rotation WFL 20  Knee flexion WFL 130  Knee extension WFL 0  Ankle dorsiflexion      Ankle plantarflexion      Ankle inversion      Ankle eversion       (Blank rows = not tested)   LOWER EXTREMITY MMT: Not tested 2/2 surgical precautions   GAIT: Distance walked: 75ft Assistive device utilized: Crutches Level of assistance: Complete Independence Comments: TTWB     TODAY'S TREATMENT:                                                                                                                              DATE:    12/9 PROM L hip LAD L hip STM L quads and hip flexors Thomas stretch modified Bridges x30 5" hold S/l hip abd 2x10 LAQ 5# 5" 2x10 Standing hip abd/ext 2x10ea  Partial squats at rail 2x10  12/5 PROM L hip STM L quads and hip flexors Thomas stretch modified Bridges x20 Prone hip extension 2# 2x10 Prone HSC 4# 2x10 LAQ 4# 5" 2x10 Side stepping at rail x 4 laps Retro walking at rail x4 laps Partial squats at rail 2x10     12/2  STM L quad: VL and rec fem focus in particular  Self foam rolling for quad Prone quad stretch 30s 3x Seated QL stretch with L leg cross 30s 2x (recreates   TPDN edu  11/25   PROM hip IR/ER Manual quad stretch in prone 30s 5x  Supine SKTC 5s 15x  Supine butterfly 30s 3x Supine piriformis 30s 3x Standing adductor stretch 30s 3x  Bridge with RTB at knees 3x10 Clam with RTB 3x10 Prone hip ext 3x10 (2x with straight leg, 1x bent knee)   11/18  PROM to protocol limits   Bridge 3x10 HR 3x10   Gait training with single crutch for reduction of antalgic gait and sensitivity; stairs with single crutch; protocol precaution review Return to walking parameters   11/11 Bandage change and wound inspection    Exercises - Supine Posterior Pelvic  Tilt  - 2-3 x daily - 7 x weekly - 2 sets -  10 reps - 2 hold - Hooklying Gluteal Sets  - 2-3 x daily - 7 x weekly - 3 sets - 10 reps - Clamshell  - 2-3 x daily - 7 x weekly - 2 sets - 10 reps - Ankle Pumps in Elevation  - 2-3 x daily - 7 x weekly - 2 sets - 20 reps  PATIENT EDUCATION:  Education details: surgical precautions, s/s of infection, diagnosis, prognosis, anatomy, exercise progression, DOMS expectations, muscle firing,  envelope of function, HEP, POC Person educated: Patient Education method: Explanation, Demonstration, Tactile cues, Verbal cues, and Handouts Education comprehension: verbalized understanding, returned demonstration, verbal cues required, and tactile cues required   HOME EXERCISE PROGRAM:   Access Code: [email or web address] (prefers print) URL: [email or web address] Date: 10/06/2023 Prepared by: Zebedee Iba    ASSESSMENT:   CLINICAL IMPRESSION: Continued to work on manual intervention to release tightness in proximal L hip. Some tightness with hip flexion and rotation PROM.  He reports benefit from hip flexor stretching. Good performance with standing tasks without c/o pain. Pt 5 weeks s/p currently.    OBJECTIVE IMPAIRMENTS: decreased activity tolerance, decreased balance, decreased endurance, decreased mobility, decreased ROM, decreased strength, hypomobility, increased muscle spasms, impaired flexibility, improper body mechanics, postural dysfunction, and pain.    ACTIVITY LIMITATIONS: lifting, squatting, locomotion level, and dressing   PARTICIPATION LIMITATIONS: interpersonal relationship, community activity, and exercise   PERSONAL FACTORS: Past/current experiences and Time since onset of injury/illness/exacerbation are also affecting patient's functional outcome.    REHAB POTENTIAL: Good   CLINICAL DECISION MAKING: Stable/uncomplicated   EVALUATION COMPLEXITY: Low     GOALS:     SHORT TERM GOALS: Target date: 11/17/2023       Pt will  become independent with HEP in order to demonstrate synthesis of PT education.   Goal status: INITIAL   2. Pt will be able to demonstrate full hip AROM in order to demonstrate functional improvement in LE function for self-care and house hold duties.      Goal status: INITIAL   3.  Pt will report at least 2 pt reduction on NPRS scale for pain in order to demonstrate functional improvement with household activity, self care, and ADL.    Goal status: INITIAL   LONG TERM GOALS: Target date: 12/29/2023         Pt  will become independent with final HEP in order to demonstrate synthesis of PT education.   Goal status: INITIAL   2.  Pt will score >/= 71 on FOTO to demonstrate improvement in perceived L hip function.      Goal status: INITIAL   3.  Pt will be able to demonstrate 20x 8" box step downs without form deviation or pain in order to demonstrate functional improvement in LE function for return to hopping/jumping.    Goal status: INITIAL   4.  Pt will be able to demonstrate 80% strength with HHD in order to demonstrate functional improvement and tolerance to return to running.    Goal status: INITIAL   5. Pt will be able to demonstrate ability to DL hop without pain in order to demonstrate functional improvement and tolerance to low level plyometric loading.     Goal status: INITIAL       PLAN:   PT FREQUENCY: 1-2x/week   PT DURATION: 12 weeks    PLANNED INTERVENTIONS: Therapeutic exercises, Therapeutic activity, Neuromuscular re-education, Balance training, Gait training, Patient/Family education, Self Care, Joint mobilization, Joint manipulation, Stair training, Aquatic Therapy, Dry Needling, Electrical  stimulation, Spinal manipulation, Spinal mobilization, Cryotherapy, Moist heat, scar mobilization, Splintting, Taping, Vasopneumatic device, Traction, Ultrasound, Ionotophoresis 4mg /ml Dexamethasone, Manual therapy, and Re-evaluation   PLAN FOR NEXT SESSION: hip  arthroscopy protocol; ROM, muscle activation; STM and joint mobs PRN    Donnel Saxon Daionna Crossland, PTA 11/03/2023, 2:30 PM

## 2023-11-06 ENCOUNTER — Ambulatory Visit (HOSPITAL_BASED_OUTPATIENT_CLINIC_OR_DEPARTMENT_OTHER): Payer: 59 | Admitting: Physical Therapy

## 2023-11-07 ENCOUNTER — Ambulatory Visit (INDEPENDENT_AMBULATORY_CARE_PROVIDER_SITE_OTHER): Payer: 59 | Admitting: Orthopaedic Surgery

## 2023-11-07 DIAGNOSIS — M25859 Other specified joint disorders, unspecified hip: Secondary | ICD-10-CM

## 2023-11-07 NOTE — Progress Notes (Signed)
Post Operative Evaluation    Procedure/Date of Surgery: Left hip arthroscopy with labral repair and cam debridement 11/4   Presents today 6 weeks status post the above procedure.  He is still having some pain on the vastus lateralis as well as rectus involving the quad on the left side.   PMH/PSH/Family History/Social History/Meds/Allergies:    Past Medical History:  Diagnosis Date   Anxiety    Blood transfusion without reported diagnosis 2010   During Chemo Treatments   Hypothyroidism    Leukemia, acute myeloid, in remission (HCC) 2010   Thyroid disease    Past Surgical History:  Procedure Laterality Date   CYSTECTOMY Left    Scrotum   PORT-A-CATH REMOVAL     WISDOM TOOTH EXTRACTION     Social History   Socioeconomic History   Marital status: Married    Spouse name: Not on file   Number of children: 0   Years of education: college   Highest education level: Bachelor's degree (e.g., BA, AB, BS)  Occupational History   Occupation: Disabled  Tobacco Use   Smoking status: Never   Smokeless tobacco: Never  Vaping Use   Vaping status: Never Used  Substance and Sexual Activity   Alcohol use: Yes    Comment: "maybe one drink every two years"   Drug use: No   Sexual activity: Yes    Partners: Female  Other Topics Concern   Not on file  Social History Narrative   Lives at home with his wife.   Left-handed.   No daily caffeine use.    Social Drivers of Corporate investment banker Strain: Low Risk  (02/14/2023)   Overall Financial Resource Strain (CARDIA)    Difficulty of Paying Living Expenses: Not hard at all  Food Insecurity: No Food Insecurity (02/14/2023)   Hunger Vital Sign    Worried About Running Out of Food in the Last Year: Never true    Ran Out of Food in the Last Year: Never true  Transportation Needs: No Transportation Needs (02/14/2023)   PRAPARE - Administrator, Civil Service (Medical): No    Lack of  Transportation (Non-Medical): No  Physical Activity: Insufficiently Active (02/14/2023)   Exercise Vital Sign    Days of Exercise per Week: 3 days    Minutes of Exercise per Session: 30 min  Stress: No Stress Concern Present (02/14/2023)   [employer] of Occupational Health - Occupational Stress Questionnaire    Feeling of Stress : Only a little  Social Connections: Unknown (02/14/2023)   Social Connection and Isolation Panel [NHANES]    Frequency of Communication with Friends and Family: Once a week    Frequency of Social Gatherings with Friends and Family: Patient declined    Attends Religious Services: Never    Database administrator or Organizations: No    Attends Engineer, structural: Not on file    Marital Status: Married   Family History  Problem Relation Age of Onset   Hypertension Mother    Hypertension Father    Multiple sclerosis Maternal Grandmother    Dementia Maternal Grandmother    Diabetes Maternal Grandfather    Heart attack Maternal Grandfather 13   Diabetes Paternal Grandfather    Thyroid cancer Maternal Aunt    Prostate cancer Neg  Hx    Colon cancer Neg Hx    Allergies  Allergen Reactions   Topamax [Topiramate] Other (See Comments)    Anger/irritable   Trazodone And Nefazodone Nausea Only   Current Outpatient Medications  Medication Sig Dispense Refill   acetaminophen (TYLENOL) 500 MG tablet Take 1,000 mg by mouth every 6 (six) hours as needed.     diazepam (VALIUM) 5 MG tablet Take 1 tablet (5 mg total) by mouth every 12 (twelve) hours as needed for muscle spasms 30 tablet 0   ibuprofen (ADVIL) 200 MG tablet Take 200-400 mg by mouth every 6 (six) hours as needed for moderate pain.     levothyroxine (SYNTHROID) 150 MCG tablet Take 1 tablet (150 mcg total) by mouth daily. 90 tablet 3   Multiple Vitamin (MULTIVITAMIN WITH MINERALS) TABS tablet Take 1 tablet by mouth daily.     No current facility-administered medications for this visit.   No  results found.  Review of Systems:   A ROS was performed including pertinent positives and negatives as documented in the HPI.   Musculoskeletal Exam:    There were no vitals taken for this visit.  Left hip with 30 degrees internal/external rotation of the hip without pain.  Active flexion of the left hip is to 90 degrees.  Mild Trendelenburg gait.  Walks with crutches.  Remainder of distal neurosensory exam is intact with 2+ dorsalis pedis pulse  Imaging:      I personally reviewed and interpreted the radiographs.   Assessment:   6 weeks status post left hip arthroscopy with cam debridement and labral repair overall doing very well.  At this point I do believe he has some weakness in his core as well as hip flexors which is likely causing some referred pain to the quad.  I would like him to begin working potentially with some quad strengthening as well as blood flow restriction to help with the thigh.  Plan to see him back in 6 weeks for reassessment  Plan :    -Return to clinic in 6 weeks for reassessment.      I personally saw and evaluated the patient, and participated in the management and treatment plan.  Huel Cote, MD Attending Physician, Orthopedic Surgery  This document was dictated using Dragon voice recognition software. A reasonable attempt at proof reading has been made to minimize errors.

## 2023-11-10 ENCOUNTER — Ambulatory Visit (HOSPITAL_BASED_OUTPATIENT_CLINIC_OR_DEPARTMENT_OTHER): Payer: 59

## 2023-11-10 ENCOUNTER — Encounter (HOSPITAL_BASED_OUTPATIENT_CLINIC_OR_DEPARTMENT_OTHER): Payer: Self-pay

## 2023-11-10 DIAGNOSIS — M6281 Muscle weakness (generalized): Secondary | ICD-10-CM | POA: Diagnosis not present

## 2023-11-10 DIAGNOSIS — R262 Difficulty in walking, not elsewhere classified: Secondary | ICD-10-CM

## 2023-11-10 DIAGNOSIS — M25552 Pain in left hip: Secondary | ICD-10-CM

## 2023-11-10 NOTE — Therapy (Signed)
OUTPATIENT PHYSICAL THERAPY LOWER EXTREMITY TREATMENT   Patient Name: Zachary Little MRN: [id number] DOB:02/07/1981, 42 y.o., male Today's Date: 11/10/2023  END OF SESSION:  PT End of Session - 11/10/23 1018     Visit Number 6    Number of Visits 26    Date for PT Re-Evaluation 01/04/24    Authorization Type [facility] [employer]    PT Start Time 1017    PT Stop Time 1100    PT Time Calculation (min) 43 min    Activity Tolerance Patient tolerated treatment well    Behavior During Therapy WFL for tasks assessed/performed                  Past Medical History:  Diagnosis Date   Anxiety    Blood transfusion without reported diagnosis 2010   During Chemo Treatments   Hypothyroidism    Leukemia, acute myeloid, in remission (HCC) 2010   Thyroid disease    Past Surgical History:  Procedure Laterality Date   CYSTECTOMY Left    Scrotum   PORT-A-CATH REMOVAL     WISDOM TOOTH EXTRACTION     Patient Active Problem List   Diagnosis Date Noted   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   Chronic right hip pain 09/23/2023   Labral tear of left hip joint 10/01/2022   DDD (degenerative disc disease), cervical, 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 C6 foraminal stenosis with left-sided periscapular discomfort 04/23/2022   Pes anserinus bursitis of left knee 03/19/2022   Chronic pain syndrome 12/29/2019   Neuropathic pain of thigh, right 12/29/2019   Labral tear of shoulder, right, sequela 10/06/2019   Depression, major, single episode, 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   Erectile dysfunction 03/09/2018   Obesity 03/09/2018   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 Left hip labral repair Left hip CAM debridement  SUBJECTIVE:   SUBJECTIVE STATEMENT:  Pt saw MD recently who wants to try BFR. Pt continues to have pain in anterior hip.   Eval:  Pt has been compliant with WB precautions. Pt has been resting and protecting the hip appropriately. The lateral thigh on the L is aching and painful on occasion. Pt has been using aspirin and icing as directed. Denies NT. Denies s/s of infection. Pt states he has not been icing as much recently. Was not provided a brace post-op.   PERTINENT HISTORY: N/A PAIN:  Are you having pain? No: NPRS scale: 0/10 Pain location: L lateral Pain description: aching Aggravating factors: movement  Relieving factors: rest/ meds   PRECAUTIONS: Other: L hip labral repair  RED FLAGS: None   WEIGHT BEARING RESTRICTIONS: Yes TTWB for first two weeks  FALLS:  Has patient fallen in last 6 months? No  LIVING ENVIRONMENT: Lives with: lives with their family Lives in: House/apartment Stairs: 2nd story home Has following equipment at home: Crutches  OCCUPATION: N/A  Running- flat road with occasional trails/hills  PLOF: Independent  PATIENT GOALS: be able  to run about 3 miles again     OBJECTIVE:  Note: Objective measures were completed at Evaluation unless otherwise noted.  DIAGNOSTIC FINDINGS: N/A  PATIENT SURVEYS:  FOTO 30 71 @ DC 19 pts MCII 69% on 12/16   COGNITION: Overall cognitive status: Within functional limits for tasks assessed                         SENSATION: WFL   MUSCLE LENGTH: Hamstrings: positive supine 90/90   POSTURE: No Significant postural limitations   PALPATION: NT around incision sites, no erythema, no signs of drainage,  Tegaderm bandage in place   LOWER EXTREMITY ROM:   Passive ROM Right eval Left eval  Hip flexion WFL 75  Hip extension WFL 0  Hip abduction WFL 30  Hip adduction      Hip internal rotation WFL 30  Hip external rotation WFL 20  Knee flexion WFL 130  Knee extension WFL 0  Ankle dorsiflexion      Ankle plantarflexion      Ankle inversion      Ankle eversion       (Blank rows = not tested)   LOWER EXTREMITY MMT: Not tested 2/2 surgical precautions   GAIT: Distance walked: 72ft Assistive device utilized: Crutches Level of assistance: Complete Independence Comments: TTWB     TODAY'S TREATMENT:                                                                                                                              DATE:   12/16: Quad sets Thomas stretch (Modified) pre and post BFR Manual ITB stretch (pre and post BFR) PROM L hip BFR: Partial SLR x30, 3x15 Quad sets x30, 3x15   12/5 PROM L hip STM L quads and hip flexors Thomas stretch modified Bridges x20 Prone hip extension 2# 2x10 Prone HSC 4# 2x10 LAQ 4# 5" 2x10 Side stepping at rail x 4 laps Retro walking at rail x4 laps Partial squats at rail 2x10     12/2  STM L quad: VL and rec fem focus in particular  Self foam rolling for quad Prone quad stretch 30s 3x Seated QL stretch with L leg cross 30s 2x (recreates   TPDN edu  11/25   PROM hip IR/ER Manual quad stretch in prone 30s 5x  Supine SKTC 5s 15x  Supine butterfly 30s 3x Supine piriformis 30s 3x Standing adductor stretch 30s 3x  Bridge with RTB at knees 3x10 Clam with RTB 3x10 Prone hip ext 3x10 (2x with straight leg, 1x bent knee)     PATIENT EDUCATION:  Education details: surgical precautions, s/s of infection, diagnosis, prognosis, anatomy, exercise progression, DOMS expectations, muscle firing,  envelope of function, HEP, POC Person educated: Patient Education method: Explanation, Demonstration, Tactile cues, Verbal  cues, and Handouts Education comprehension: verbalized understanding, returned demonstration, verbal cues required, and tactile cues required   HOME EXERCISE PROGRAM:  Access Code: [email or web address] (prefers print) URL: [email or web address] Date: 10/06/2023 Prepared by: Zebedee Iba    ASSESSMENT:   CLINICAL IMPRESSION: Some tightness with passive hip IR and has some discomfort with end range passive hip flexion. Trialed BFR at MD recommendation. Pt described feeling "looser" in hip flexors following this. He remains tender and tight into ITB and lateral hip. Will assess response to BFR next visit and continue if beneficial. Will monitor ITB tightness/discomfort.    OBJECTIVE IMPAIRMENTS: decreased activity tolerance, decreased balance, decreased endurance, decreased mobility, decreased ROM, decreased strength, hypomobility, increased muscle spasms, impaired flexibility, improper body mechanics, postural dysfunction, and pain.    ACTIVITY LIMITATIONS: lifting, squatting, locomotion level, and dressing   PARTICIPATION LIMITATIONS: interpersonal relationship, community activity, and exercise   PERSONAL FACTORS: Past/current experiences and Time since onset of injury/illness/exacerbation are also affecting patient's functional outcome.    REHAB POTENTIAL: Good   CLINICAL DECISION MAKING: Stable/uncomplicated   EVALUATION COMPLEXITY: Low     GOALS:     SHORT TERM GOALS: Target date: 11/17/2023       Pt will become independent with HEP in order to demonstrate synthesis of PT education.   Goal status: INITIAL   2. Pt will be able to demonstrate full hip AROM in order to demonstrate functional improvement in LE function for self-care and house hold duties.      Goal status: INITIAL   3.  Pt will report at least 2 pt reduction on NPRS scale for pain in order to demonstrate functional improvement with household activity, self care, and ADL.    Goal status: INITIAL   LONG  TERM GOALS: Target date: 12/29/2023         Pt  will become independent with final HEP in order to demonstrate synthesis of PT education.   Goal status: INITIAL   2.  Pt will score >/= 71 on FOTO to demonstrate improvement in perceived L hip function.      Goal status: INITIAL   3.  Pt will be able to demonstrate 20x 8" box step downs without form deviation or pain in order to demonstrate functional improvement in LE function for return to hopping/jumping.    Goal status: INITIAL   4.  Pt will be able to demonstrate 80% strength with HHD in order to demonstrate functional improvement and tolerance to return to running.    Goal status: INITIAL   5. Pt will be able to demonstrate ability to DL hop without pain in order to demonstrate functional improvement and tolerance to low level plyometric loading.     Goal status: INITIAL       PLAN:   PT FREQUENCY: 1-2x/week   PT DURATION: 12 weeks    PLANNED INTERVENTIONS: Therapeutic exercises, Therapeutic activity, Neuromuscular re-education, Balance training, Gait training, Patient/Family education, Self Care, Joint mobilization, Joint manipulation, Stair training, Aquatic Therapy, Dry Needling, Electrical stimulation, Spinal manipulation, Spinal mobilization, Cryotherapy, Moist heat, scar mobilization, Splintting, Taping, Vasopneumatic device, Traction, Ultrasound, Ionotophoresis 4mg /ml Dexamethasone, Manual therapy, and Re-evaluation   PLAN FOR NEXT SESSION: hip arthroscopy protocol; ROM, muscle activation; STM and joint mobs PRN    Donnel Saxon Deiondra Denley, PTA 11/10/2023, 11:48 AM

## 2023-11-11 ENCOUNTER — Ambulatory Visit: Payer: 59

## 2023-11-11 ENCOUNTER — Ambulatory Visit (INDEPENDENT_AMBULATORY_CARE_PROVIDER_SITE_OTHER): Payer: 59

## 2023-11-11 ENCOUNTER — Ambulatory Visit (INDEPENDENT_AMBULATORY_CARE_PROVIDER_SITE_OTHER): Payer: 59 | Admitting: Sports Medicine

## 2023-11-11 ENCOUNTER — Encounter: Payer: Self-pay | Admitting: Sports Medicine

## 2023-11-11 DIAGNOSIS — M533 Sacrococcygeal disorders, not elsewhere classified: Secondary | ICD-10-CM | POA: Diagnosis not present

## 2023-11-11 DIAGNOSIS — G8929 Other chronic pain: Secondary | ICD-10-CM

## 2023-11-11 DIAGNOSIS — M25551 Pain in right hip: Secondary | ICD-10-CM

## 2023-11-11 DIAGNOSIS — M25451 Effusion, right hip: Secondary | ICD-10-CM | POA: Diagnosis not present

## 2023-11-11 DIAGNOSIS — M160 Bilateral primary osteoarthritis of hip: Secondary | ICD-10-CM | POA: Diagnosis not present

## 2023-11-11 DIAGNOSIS — S73191A Other sprain of right hip, initial encounter: Secondary | ICD-10-CM | POA: Diagnosis not present

## 2023-11-11 MED ORDER — GADOBUTROL 1 MMOL/ML IV SOLN
1.0000 mL | Freq: Once | INTRAVENOUS | Status: AC | PRN
  Administered 2023-11-11: 1 mL via INTRAVENOUS

## 2023-11-11 NOTE — Progress Notes (Signed)
    Procedures performed today:    Procedure: Real-time Ultrasound Guided gadolinium contrast injection of right hip joint Device: Samsung [id number]  Verbal informed consent obtained.  Time-out conducted.  Noted no overlying erythema, induration, or other signs of local infection.  Skin prepped in a sterile fashion.  Local anesthesia: Topical Ethyl chloride.  With sterile technique and under real time ultrasound guidance: 22-gauge spinal needle advanced to the femoral head/neck junction, noted arthritic changes and cam lesion, contacted bone, I injected 1 cc kenalog 40, 2 cc lidocaine, 2 cc bupivacaine, syringe switched and 0.1 cc gadolinium injected, syringe again switched and 10 cc sterile saline used to fully distend the joint. Joint visualized and capsule seen distending confirming intra-articular placement of contrast material and medication. Completed without difficulty  Advised to call if fevers/chills, erythema, induration, drainage, or persistent bleeding.  Images permanently stored in PACS Impression: Technically successful ultrasound guided gadolinium contrast injection for MR arthrography.  Please see separate MR arthrogram report.  Independent interpretation of notes and tests performed by another provider:   None.  Brief History, Exam, Impression, and Recommendations:    Chronic right hip pain Persistent chronic right hip pain for years, we have done many months of physical therapy, x-rays unrevealing, he has a left hip labral tear so I do suspect similar on the right, ordering MR arthrography, he will schedule this once he has recovered enough from his left hip arthroscopy. We will also going get an MRI of his sacroiliac joints.  Update: Injection performed today for MR arthrography.    ____________________________________________ Ihor Austin. Benjamin Stain, M.D., ABFM., CAQSM., AME. Primary Care and Sports Medicine [facility]  Adjunct Professor of  Family Medicine  [employer] [facility] [employer]  Restaurant manager, fast food

## 2023-11-11 NOTE — Assessment & Plan Note (Signed)
Persistent chronic right hip pain for years, we have done many months of physical therapy, x-rays unrevealing, he has a left hip labral tear so I do suspect similar on the right, ordering MR arthrography, he will schedule this once he has recovered enough from his left hip arthroscopy. We will also going get an MRI of his sacroiliac joints.  Update: Injection performed today for MR arthrography.

## 2023-11-13 ENCOUNTER — Ambulatory Visit (HOSPITAL_BASED_OUTPATIENT_CLINIC_OR_DEPARTMENT_OTHER): Payer: 59 | Admitting: Physical Therapy

## 2023-11-13 ENCOUNTER — Encounter (HOSPITAL_BASED_OUTPATIENT_CLINIC_OR_DEPARTMENT_OTHER): Payer: Self-pay | Admitting: Physical Therapy

## 2023-11-13 DIAGNOSIS — R262 Difficulty in walking, not elsewhere classified: Secondary | ICD-10-CM

## 2023-11-13 DIAGNOSIS — M6281 Muscle weakness (generalized): Secondary | ICD-10-CM

## 2023-11-13 DIAGNOSIS — M25552 Pain in left hip: Secondary | ICD-10-CM | POA: Diagnosis not present

## 2023-11-13 NOTE — Therapy (Signed)
OUTPATIENT PHYSICAL THERAPY LOWER EXTREMITY TREATMENT   Patient Name: Zachary Little MRN: [id number] DOB:1981-03-06, 42 y.o., male Today's Date: 11/13/2023  END OF SESSION:  PT End of Session - 11/13/23 1022     Visit Number 7    Number of Visits 26    Date for PT Re-Evaluation 01/04/24    Authorization Type [facility] [employer]    PT Start Time 1017    PT Stop Time 1059    PT Time Calculation (min) 42 min    Activity Tolerance Patient tolerated treatment well    Behavior During Therapy WFL for tasks assessed/performed                  Past Medical History:  Diagnosis Date   Anxiety    Blood transfusion without reported diagnosis 2010   During Chemo Treatments   Hypothyroidism    Leukemia, acute myeloid, in remission (HCC) 2010   Thyroid disease    Past Surgical History:  Procedure Laterality Date   CYSTECTOMY Left    Scrotum   PORT-A-CATH REMOVAL     WISDOM TOOTH EXTRACTION     Patient Active Problem List   Diagnosis Date Noted   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   Chronic right hip pain 09/23/2023   Labral tear of left hip joint 10/01/2022   DDD (degenerative disc disease), cervical, 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 C6 foraminal stenosis with left-sided periscapular discomfort 04/23/2022   Pes anserinus bursitis of left knee 03/19/2022   Chronic pain syndrome 12/29/2019   Neuropathic pain of thigh, right 12/29/2019   Labral tear of shoulder, right, sequela 10/06/2019   Depression, major, single episode, 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   Erectile dysfunction 03/09/2018   Obesity 03/09/2018   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/02/2018   Knee pain 10/31/2017   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 Left hip labral repair Left hip CAM debridement  SUBJECTIVE:   SUBJECTIVE STATEMENT:  Pt states he is doing well. R hip just got injected and   Eval:  Pt has been compliant with WB precautions. Pt has been resting and protecting the hip appropriately. The lateral thigh on the L is aching and painful on occasion. Pt has been using aspirin and icing as directed. Denies NT. Denies s/s of infection. Pt states he has not been icing as much recently. Was not provided a brace post-op.   PERTINENT HISTORY: N/A PAIN:  Are you having pain? No: NPRS scale: 0/10 Pain location: L lateral Pain description: aching Aggravating factors: movement  Relieving factors: rest/ meds   PRECAUTIONS: Other: L hip labral repair  RED FLAGS: None   WEIGHT BEARING RESTRICTIONS: Yes TTWB for first two weeks  FALLS:  Has patient fallen in last 6 months? No  LIVING ENVIRONMENT: Lives with: lives with their family Lives in: House/apartment Stairs: 2nd story home Has following equipment at home: Crutches  OCCUPATION: N/A  Running- flat road with occasional trails/hills  PLOF: Independent  PATIENT GOALS: be able to run about 3 miles  again     OBJECTIVE:  Note: Objective measures were completed at Evaluation unless otherwise noted.  DIAGNOSTIC FINDINGS: N/A  PATIENT SURVEYS:  FOTO 30 71 @ DC 19 pts MCII 69% on 12/16   COGNITION: Overall cognitive status: Within functional limits for tasks assessed                         SENSATION: WFL   MUSCLE LENGTH: Hamstrings: positive supine 90/90   POSTURE: No Significant postural limitations   PALPATION: NT around incision sites, no erythema, no signs of drainage, Tegaderm bandage in place    LOWER EXTREMITY ROM:   Passive ROM Right eval Left eval  Hip flexion WFL 75  Hip extension WFL 0  Hip abduction WFL 30  Hip adduction      Hip internal rotation WFL 30  Hip external rotation WFL 20  Knee flexion WFL 130  Knee extension WFL 0  Ankle dorsiflexion      Ankle plantarflexion      Ankle inversion      Ankle eversion       (Blank rows = not tested)   LOWER EXTREMITY MMT: Not tested 2/2 surgical precautions   GAIT: Distance walked: 62ft Assistive device utilized: Crutches Level of assistance: Complete Independence Comments: TTWB     TODAY'S TREATMENT:                                                                                                                              DATE:   12/19  Recumbent bike Lvl 4 8 min  BFR leg press on shuttle: 30, 15, 15, 15 (30s rest between) 75lbs staggered stance  BFR knee ext 5lbs 30x, 15, 15, 15  Kneeling plank 30s 3x Sidestepping with RTB at knees 35 ft 3x Squats with GTB at knees 4x8 8" step up 2x10 Hip flexor stretch 30s 2x   12/16: Quad sets USAA stretch (Modified) pre and post BFR Manual ITB stretch (pre and post BFR) PROM L hip BFR: Partial SLR x30, 3x15 Quad sets x30, 3x15   12/5 PROM L hip STM L quads and hip flexors Thomas stretch modified Bridges x20 Prone hip extension 2# 2x10 Prone HSC 4# 2x10 LAQ 4# 5" 2x10 Side stepping at rail x 4 laps Retro walking at rail x4 laps Partial squats at rail 2x10     12/2  STM L quad: VL and rec fem focus in particular  Self foam rolling for quad Prone quad stretch 30s 3x Seated QL stretch with L leg cross 30s 2x (recreates   TPDN edu  11/25   PROM hip IR/ER Manual quad stretch in prone 30s 5x  Supine SKTC 5s 15x  Supine butterfly 30s 3x Supine piriformis 30s 3x Standing adductor stretch 30s 3x  Bridge with RTB at knees 3x10 Clam with RTB 3x10 Prone hip ext 3x10 (2x with straight leg, 1x bent knee)  PATIENT EDUCATION:   Education details: surgical precautions, s/s of infection, diagnosis, prognosis, anatomy, exercise progression, DOMS expectations, muscle firing,  envelope of function, HEP, POC Person educated: Patient Education method: Explanation, Demonstration, Tactile cues, Verbal cues, and Handouts Education comprehension: verbalized understanding, returned demonstration, verbal cues required, and tactile cues required   HOME EXERCISE PROGRAM:   Access Code: [email or web address] (prefers print) URL: [email or web address] Date: 10/06/2023 Prepared by: Zebedee Iba    ASSESSMENT:   CLINICAL IMPRESSION: Pt able to progress as per protocol today and continue with BFR as requested by MD. Pt able to introduce SL CKC exercise, isometric abdominal exercise, as well as squatting today. Minimal L hip irritation noted during session. Pt advised on expected DOMS and frequency of exercise to allow for recovery. Continue with BFR and progressive SL and DL exercise per protocol. Consider hip flexor marching at next. Pt would benefit from continued skilled therapy in order to reach goals and maximize functional L LE strength and ROM for full return to PLOF.    OBJECTIVE IMPAIRMENTS: decreased activity tolerance, decreased balance, decreased endurance, decreased mobility, decreased ROM, decreased strength, hypomobility, increased muscle spasms, impaired flexibility, improper body mechanics, postural dysfunction, and pain.    ACTIVITY LIMITATIONS: lifting, squatting, locomotion level, and dressing   PARTICIPATION LIMITATIONS: interpersonal relationship, community activity, and exercise   PERSONAL FACTORS: Past/current experiences and Time since onset of injury/illness/exacerbation are also affecting patient's functional outcome.    REHAB POTENTIAL: Good   CLINICAL DECISION MAKING: Stable/uncomplicated   EVALUATION COMPLEXITY: Low     GOALS:     SHORT TERM GOALS: Target date: 11/17/2023       Pt will  become independent with HEP in order to demonstrate synthesis of PT education.   Goal status: met   2. Pt will be able to demonstrate full hip AROM in order to demonstrate functional improvement in LE function for self-care and house hold duties.      Goal status: ongoing   3.  Pt will report at least 2 pt reduction on NPRS scale for pain in order to demonstrate functional improvement with household activity, self care, and ADL.    Goal status: met   LONG TERM GOALS: Target date: 12/29/2023         Pt  will become independent with final HEP in order to demonstrate synthesis of PT education.   Goal status: INITIAL   2.  Pt will score >/= 71 on FOTO to demonstrate improvement in perceived L hip function.      Goal status: INITIAL   3.  Pt will be able to demonstrate 20x 8" box step downs without form deviation or pain in order to demonstrate functional improvement in LE function for return to hopping/jumping.    Goal status: INITIAL   4.  Pt will be able to demonstrate 80% strength with HHD in order to demonstrate functional improvement and tolerance to return to running.    Goal status: INITIAL   5. Pt will be able to demonstrate ability to DL hop without pain in order to demonstrate functional improvement and tolerance to low level plyometric loading.     Goal status: INITIAL       PLAN:   PT FREQUENCY: 1-2x/week   PT DURATION: 12 weeks    PLANNED INTERVENTIONS: Therapeutic exercises, Therapeutic activity, Neuromuscular re-education, Balance training, Gait training, Patient/Family education, Self Care, Joint mobilization, Joint manipulation, Stair training, Aquatic Therapy, Dry Needling, Electrical stimulation, Spinal manipulation, Spinal mobilization, Cryotherapy, Moist heat, scar mobilization,  Splintting, Taping, Vasopneumatic device, Traction, Ultrasound, Ionotophoresis 4mg /ml Dexamethasone, Manual therapy, and Re-evaluation   PLAN FOR NEXT SESSION: hip arthroscopy  protocol; ROM, muscle activation; STM and joint mobs PRN    Zebedee Iba, PT 11/13/2023, 11:01 AM

## 2023-11-23 ENCOUNTER — Other Ambulatory Visit (HOSPITAL_BASED_OUTPATIENT_CLINIC_OR_DEPARTMENT_OTHER): Payer: Self-pay

## 2023-11-24 ENCOUNTER — Ambulatory Visit (HOSPITAL_BASED_OUTPATIENT_CLINIC_OR_DEPARTMENT_OTHER): Payer: 59 | Admitting: Physical Therapy

## 2023-11-24 ENCOUNTER — Other Ambulatory Visit (HOSPITAL_BASED_OUTPATIENT_CLINIC_OR_DEPARTMENT_OTHER): Payer: Self-pay

## 2023-11-24 ENCOUNTER — Encounter (HOSPITAL_BASED_OUTPATIENT_CLINIC_OR_DEPARTMENT_OTHER): Payer: Self-pay | Admitting: Physical Therapy

## 2023-11-24 DIAGNOSIS — R262 Difficulty in walking, not elsewhere classified: Secondary | ICD-10-CM | POA: Diagnosis not present

## 2023-11-24 DIAGNOSIS — M6281 Muscle weakness (generalized): Secondary | ICD-10-CM

## 2023-11-24 DIAGNOSIS — M25552 Pain in left hip: Secondary | ICD-10-CM | POA: Diagnosis not present

## 2023-11-24 NOTE — Therapy (Signed)
OUTPATIENT PHYSICAL THERAPY LOWER EXTREMITY TREATMENT   Patient Name: Zachary Little MRN: [id number] DOB:May 18, 1981, 42 y.o., male Today's Date: 11/24/2023  END OF SESSION:  PT End of Session - 11/24/23 1021     Visit Number 8    Number of Visits 26    Date for PT Re-Evaluation 01/04/24    Authorization Type [facility] [employer]    PT Start Time 1015    PT Stop Time 1053    PT Time Calculation (min) 38 min    Activity Tolerance Patient tolerated treatment well    Behavior During Therapy WFL for tasks assessed/performed                   Past Medical History:  Diagnosis Date   Anxiety    Blood transfusion without reported diagnosis 2010   During Chemo Treatments   Hypothyroidism    Leukemia, acute myeloid, in remission (HCC) 2010   Thyroid disease    Past Surgical History:  Procedure Laterality Date   CYSTECTOMY Left    Scrotum   PORT-A-CATH REMOVAL     WISDOM TOOTH EXTRACTION     Patient Active Problem List   Diagnosis Date Noted   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   Chronic right hip pain 09/23/2023   Labral tear of left hip joint 10/01/2022   DDD (degenerative disc disease), cervical, 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 C6 foraminal stenosis with left-sided periscapular discomfort 04/23/2022   Pes anserinus bursitis of left knee 03/19/2022   Chronic pain syndrome 12/29/2019   Neuropathic pain of thigh, right 12/29/2019   Labral tear of shoulder, right, sequela 10/06/2019   Depression, major, single episode, 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   Erectile dysfunction 03/09/2018   Obesity 03/09/2018   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 Left hip labral repair Left hip CAM debridement  SUBJECTIVE:   SUBJECTIVE STATEMENT:  Pt states that the R thigh pain is slightly improved. It is less uncomfortable now. However, long travel over the holidays was uncomfortable. 8 wks out  Eval:  Pt has been compliant with WB precautions. Pt has been resting and protecting the hip appropriately. The lateral thigh on the L is aching and painful on occasion. Pt has been using aspirin and icing as directed. Denies NT. Denies s/s of infection. Pt states he has not been icing as much recently. Was not provided a brace post-op.   PERTINENT HISTORY: N/A PAIN:  Are you having pain? No: NPRS scale: 0/10 Pain location: L lateral Pain description: aching Aggravating factors: movement  Relieving factors: rest/ meds   PRECAUTIONS: Other: L hip labral repair  RED FLAGS: None   WEIGHT BEARING RESTRICTIONS: Yes TTWB for first two weeks  FALLS:  Has patient fallen in last 6 months? No  LIVING ENVIRONMENT: Lives with: lives with their family Lives in: House/apartment Stairs: 2nd story home Has following equipment at home: Crutches  OCCUPATION: N/A  Running- flat road with occasional  trails/hills  PLOF: Independent  PATIENT GOALS: be able to run about 3 miles again     OBJECTIVE:  Note: Objective measures were completed at Evaluation unless otherwise noted.  DIAGNOSTIC FINDINGS: N/A  PATIENT SURVEYS:  FOTO 30 71 @ DC 19 pts MCII 69% on 12/16   COGNITION: Overall cognitive status: Within functional limits for tasks assessed                         SENSATION: WFL   MUSCLE LENGTH: Hamstrings: positive supine 90/90   POSTURE: No Significant postural limitations    PALPATION: NT around incision sites, no erythema, no signs of drainage, Tegaderm bandage in place   LOWER EXTREMITY ROM:   Passive ROM Right eval Left eval  Hip flexion WFL 75  Hip extension WFL 0  Hip abduction WFL 30  Hip adduction      Hip internal rotation WFL 30  Hip external rotation WFL 20  Knee flexion WFL 130  Knee extension WFL 0  Ankle dorsiflexion      Ankle plantarflexion      Ankle inversion      Ankle eversion       (Blank rows = not tested)   LOWER EXTREMITY MMT: Not tested 2/2 surgical precautions   GAIT: Distance walked: 53ft Assistive device utilized: Crutches Level of assistance: Complete Independence Comments: TTWB     TODAY'S TREATMENT:                                                                                                                              DATE:   12/30  Recumbent bike Lvl 1 6 min Bar assist deep squat 3s pause 3x8 Lateral lunge stretch 30s 3x Lateral lunge at rail 2x10 Sidestepping with GTB at ankles 35 ft 3x SLS with 10lb weight pass 3x20 8" step up 2x10 lateral  12/19  Recumbent bike Lvl 4 8 min  BFR leg press on shuttle: 30, 15, 15, 15 (30s rest between) 75lbs staggered stance  BFR knee ext 5lbs 30x, 15, 15, 15  Kneeling plank 30s 3x Sidestepping with RTB at knees 35 ft 3x Squats with GTB at knees 4x8 8" step up 2x10 Hip flexor stretch 30s 2x   12/16: Quad sets USAA stretch (Modified) pre and post BFR Manual ITB stretch (pre and post BFR) PROM L hip BFR: Partial SLR x30, 3x15 Quad sets x30, 3x15   12/5 PROM L hip STM L quads and hip flexors Thomas stretch modified Bridges x20 Prone hip extension 2# 2x10 Prone HSC 4# 2x10 LAQ 4# 5" 2x10 Side stepping at rail x 4 laps Retro walking at rail x4 laps Partial squats at rail 2x10     12/2  STM L quad: VL and rec fem focus in particular  Self foam rolling for quad Prone quad stretch 30s 3x Seated QL stretch with L leg cross 30s 2x  (recreates  TPDN edu  11/25   PROM hip IR/ER Manual quad stretch in prone 30s 5x  Supine SKTC 5s 15x  Supine butterfly 30s 3x Supine piriformis 30s 3x Standing adductor stretch 30s 3x  Bridge with RTB at knees 3x10 Clam with RTB 3x10 Prone hip ext 3x10 (2x with straight leg, 1x bent knee)     PATIENT EDUCATION:  Education details: anatomy, exercise progression, DOMS expectations, muscle firing,  envelope of function, HEP, POC Person educated: Patient Education method: Explanation, Demonstration, Tactile cues, Verbal cues, and Handouts Education comprehension: verbalized understanding, returned demonstration, verbal cues required, and tactile cues required   HOME EXERCISE PROGRAM:   Access Code: [email or web address] (prefers print) URL: [email or web address] Date: 10/06/2023 Prepared by: Zebedee Iba    ASSESSMENT:   CLINICAL IMPRESSION: Pt now 8 wks post op. Pt able to progress as per protocol and continue with quad, hip ABD, and hip flexor strengthening. Pt exercise progressed to be more SL as well as into deeper degrees of hip flexion. Pt fatigues quickly in deep squat holds due to hip weakness and notices it especially into the L adductor. Pain noted during session only with excessively deep squatting. However, pt advised to continue working into fuller ROM as tolerated with HEP. HEP updated today. Consider more SL stance exercise as well as gym based strength. Pt would benefit from continued skilled therapy in order to reach goals and maximize functional L LE strength and ROM for full return to PLOF.    OBJECTIVE IMPAIRMENTS: decreased activity tolerance, decreased balance, decreased endurance, decreased mobility, decreased ROM, decreased strength, hypomobility, increased muscle spasms, impaired flexibility, improper body mechanics, postural dysfunction, and pain.    ACTIVITY LIMITATIONS: lifting, squatting, locomotion level, and dressing   PARTICIPATION LIMITATIONS:  interpersonal relationship, community activity, and exercise   PERSONAL FACTORS: Past/current experiences and Time since onset of injury/illness/exacerbation are also affecting patient's functional outcome.    REHAB POTENTIAL: Good   CLINICAL DECISION MAKING: Stable/uncomplicated   EVALUATION COMPLEXITY: Low     GOALS:     SHORT TERM GOALS: Target date: 11/17/2023       Pt will become independent with HEP in order to demonstrate synthesis of PT education.   Goal status: met   2. Pt will be able to demonstrate full hip AROM in order to demonstrate functional improvement in LE function for self-care and house hold duties.      Goal status: ongoing   3.  Pt will report at least 2 pt reduction on NPRS scale for pain in order to demonstrate functional improvement with household activity, self care, and ADL.    Goal status: met   LONG TERM GOALS: Target date: 12/29/2023         Pt  will become independent with final HEP in order to demonstrate synthesis of PT education.   Goal status: INITIAL   2.  Pt will score >/= 71 on FOTO to demonstrate improvement in perceived L hip function.      Goal status: INITIAL   3.  Pt will be able to demonstrate 20x 8" box step downs without form deviation or pain in order to demonstrate functional improvement in LE function for return to hopping/jumping.    Goal status: INITIAL   4.  Pt will be able to demonstrate 80% strength with HHD in order to demonstrate functional improvement and tolerance to return to running.    Goal status: INITIAL   5. Pt will be able to demonstrate ability to DL hop without  pain in order to demonstrate functional improvement and tolerance to low level plyometric loading.     Goal status: INITIAL       PLAN:   PT FREQUENCY: 1-2x/week   PT DURATION: 12 weeks    PLANNED INTERVENTIONS: Therapeutic exercises, Therapeutic activity, Neuromuscular re-education, Balance training, Gait training, Patient/Family  education, Self Care, Joint mobilization, Joint manipulation, Stair training, Aquatic Therapy, Dry Needling, Electrical stimulation, Spinal manipulation, Spinal mobilization, Cryotherapy, Moist heat, scar mobilization, Splintting, Taping, Vasopneumatic device, Traction, Ultrasound, Ionotophoresis 4mg /ml Dexamethasone, Manual therapy, and Re-evaluation   PLAN FOR NEXT SESSION: hip arthroscopy protocol; ROM, muscle activation; STM and joint mobs PRN    Zebedee Iba, PT 11/24/2023, 10:57 AM

## 2023-11-27 ENCOUNTER — Ambulatory Visit (HOSPITAL_BASED_OUTPATIENT_CLINIC_OR_DEPARTMENT_OTHER): Payer: 59 | Admitting: Physical Therapy

## 2023-11-28 ENCOUNTER — Encounter: Payer: Self-pay | Admitting: Sports Medicine

## 2023-12-01 ENCOUNTER — Ambulatory Visit (HOSPITAL_BASED_OUTPATIENT_CLINIC_OR_DEPARTMENT_OTHER): Payer: 59 | Attending: Orthopaedic Surgery

## 2023-12-01 ENCOUNTER — Encounter (HOSPITAL_BASED_OUTPATIENT_CLINIC_OR_DEPARTMENT_OTHER): Payer: Self-pay

## 2023-12-01 DIAGNOSIS — M25552 Pain in left hip: Secondary | ICD-10-CM | POA: Diagnosis not present

## 2023-12-01 DIAGNOSIS — M25511 Pain in right shoulder: Secondary | ICD-10-CM | POA: Insufficient documentation

## 2023-12-01 DIAGNOSIS — R262 Difficulty in walking, not elsewhere classified: Secondary | ICD-10-CM | POA: Diagnosis not present

## 2023-12-01 DIAGNOSIS — G8929 Other chronic pain: Secondary | ICD-10-CM | POA: Diagnosis not present

## 2023-12-01 DIAGNOSIS — G2589 Other specified extrapyramidal and movement disorders: Secondary | ICD-10-CM | POA: Diagnosis not present

## 2023-12-01 DIAGNOSIS — M6281 Muscle weakness (generalized): Secondary | ICD-10-CM | POA: Insufficient documentation

## 2023-12-01 NOTE — Therapy (Signed)
 OUTPATIENT PHYSICAL THERAPY LOWER EXTREMITY TREATMENT   Patient Name: Zachary Little MRN: [id number] DOB:July 13, 1981, 43 y.o., male Today's Date: 12/01/2023  END OF SESSION:  PT End of Session - 12/01/23 1008     Visit Number 9    Number of Visits 26    Date for PT Re-Evaluation 01/04/24    Authorization Type [facility] [employer]    PT Start Time 1008    PT Stop Time 1052    PT Time Calculation (min) 44 min    Activity Tolerance Patient tolerated treatment well    Behavior During Therapy WFL for tasks assessed/performed                    Past Medical History:  Diagnosis Date   Anxiety    Blood transfusion without reported diagnosis 2010   During Chemo Treatments   Hypothyroidism    Leukemia, acute myeloid, in remission (HCC) 2010   Thyroid  disease    Past Surgical History:  Procedure Laterality Date   CYSTECTOMY Left    Scrotum   PORT-A-CATH REMOVAL     WISDOM TOOTH EXTRACTION     Patient Active Problem List   Diagnosis Date Noted   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   Chronic right hip pain 09/23/2023   Labral tear of left hip joint 10/01/2022   DDD (degenerative disc disease), cervical, 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 C6 foraminal stenosis with left-sided periscapular discomfort 04/23/2022   Pes anserinus bursitis of left knee 03/19/2022   Chronic pain syndrome 12/29/2019   Neuropathic pain of thigh, right 12/29/2019   Labral tear of shoulder, right, sequela 10/06/2019   Depression, major, single episode, 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   Erectile dysfunction 03/09/2018   Obesity 03/09/2018   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/02/2018   Knee pain 10/31/2017   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 Left hip labral repair Left hip CAM debridement  SUBJECTIVE:   SUBJECTIVE STATEMENT:  Pt reports he had significant soreness after last PT session. I had to cancel the one after that. Reports no pain at entry. Has some gorin pain with exercise. Having some pain in trigger point in R upper trap region as well.   Eval:  Pt has been compliant with WB precautions. Pt has been resting and protecting the hip appropriately. The lateral thigh on the L is aching and painful on occasion. Pt has been using aspirin and icing as directed. Denies NT. Denies s/s of infection. Pt states he has not been icing as much recently. Was not provided a brace post-op.   PERTINENT HISTORY: N/A PAIN:  Are you having pain? No: NPRS scale: 0/10 Pain location: L lateral Pain description: aching Aggravating factors: movement  Relieving factors: rest/ meds   PRECAUTIONS: Other: L hip labral repair  RED FLAGS: None   WEIGHT BEARING RESTRICTIONS: Yes TTWB for first two weeks  FALLS:  Has patient fallen in last 6 months? No  LIVING ENVIRONMENT: Lives with: lives with their family Lives in: House/apartment Stairs:  2nd story home Has following equipment at home: Crutches  OCCUPATION: N/A  Running- flat road with occasional trails/hills  PLOF: Independent  PATIENT GOALS: be able to run about 3 miles again     OBJECTIVE:  Note: Objective measures were completed at Evaluation unless otherwise noted.  DIAGNOSTIC FINDINGS: N/A  PATIENT SURVEYS:  FOTO 30 71 @ DC 19 pts MCII 69% on 12/16   COGNITION: Overall cognitive status: Within functional limits for tasks assessed                         SENSATION: WFL   MUSCLE LENGTH: Hamstrings:  positive supine 90/90   POSTURE: No Significant postural limitations   PALPATION: NT around incision sites, no erythema, no signs of drainage, Tegaderm bandage in place   LOWER EXTREMITY ROM:   Passive ROM Right eval Left eval  Hip flexion WFL 75  Hip extension WFL 0  Hip abduction WFL 30  Hip adduction      Hip internal rotation WFL 30  Hip external rotation WFL 20  Knee flexion WFL 130  Knee extension WFL 0  Ankle dorsiflexion      Ankle plantarflexion      Ankle inversion      Ankle eversion       (Blank rows = not tested)   LOWER EXTREMITY MMT: Not tested 2/2 surgical precautions   GAIT: Distance walked: 30ft Assistive device utilized: Crutches Level of assistance: Complete Independence Comments: TTWB     TODAY'S TREATMENT:                                                                                                                              DATE:   1/6  Recumbent bike Lvl 1 6 min  STM to proximal hip flexors  Supine clams/bridge- 5 clams with GTB followed by 5 bridge x10ea Single leg bridge 3 2x10 L LE Supine SLR 2x10 Sidelying hip abduction 3x10 LAQ 5# 5 2x15 Standing resisted hip flexion SLR  Runner step up 8 x30L Static lunges at rail x10ea  Squats x10 (tried 3 second hold but pt reports significant fatigue)  SLS on airex with alternating lateral reaches 2x5ea   12/30  Recumbent bike Lvl 1 6 min Bar assist deep squat 3s pause 3x8 Lateral lunge stretch 30s 3x Lateral lunge at rail 2x10 Sidestepping with GTB at ankles 35 ft 3x SLS with 10lb weight pass 3x20 8 step up 2x10 lateral  12/19  Recumbent bike Lvl 4 8 min  BFR leg press on shuttle: 30, 15, 15, 15 (30s rest between) 75lbs staggered stance  BFR knee ext 5lbs 30x, 15, 15, 15  Kneeling plank 30s 3x Sidestepping with RTB at knees 35 ft 3x Squats with GTB at knees 4x8 8 step up 2x10 Hip flexor stretch 30s 2x   12/16: Quad sets Thomas stretch (Modified) pre and post  BFR Manual ITB stretch (pre and post  BFR) PROM L hip BFR: Partial SLR x30, 3x15 Quad sets x30, 3x15   12/5 PROM L hip STM L quads and hip flexors Thomas stretch modified Bridges x20 Prone hip extension 2# 2x10 Prone HSC 4# 2x10 LAQ 4# 5 2x10 Side stepping at rail x 4 laps Retro walking at rail x4 laps Partial squats at rail 2x10     12/2  STM L quad: VL and rec fem focus in particular  Self foam rolling for quad Prone quad stretch 30s 3x Seated QL stretch with L leg cross 30s 2x (recreates   TPDN edu  11/25   PROM hip IR/ER Manual quad stretch in prone 30s 5x  Supine SKTC 5s 15x  Supine butterfly 30s 3x Supine piriformis 30s 3x Standing adductor stretch 30s 3x  Bridge with RTB at knees 3x10 Clam with RTB 3x10 Prone hip ext 3x10 (2x with straight leg, 1x bent knee)     PATIENT EDUCATION:  Education details: anatomy, exercise progression, DOMS expectations, muscle firing,  envelope of function, HEP, POC Person educated: Patient Education method: Explanation, Demonstration, Tactile cues, Verbal cues, and Handouts Education comprehension: verbalized understanding, returned demonstration, verbal cues required, and tactile cues required   HOME EXERCISE PROGRAM:   Access Code: [email or web address] (prefers print) URL: [email or web address] Date: 10/06/2023 Prepared by: Dale Call    ASSESSMENT:   CLINICAL IMPRESSION: Pt now 9 wks post op. Pt regressed slightly with exercises due to significant soreness experienced following last session. STM perofrmed to proximal hip flexors to address ongoing tightness. Fatigues primarily with squatting and lunging. Held weight with SLS on airex due to irritation of R trigger point with this. Pt plans to bring this up to MD at appt on Wednesday.    OBJECTIVE IMPAIRMENTS: decreased activity tolerance, decreased balance, decreased endurance, decreased mobility, decreased ROM, decreased strength, hypomobility,  increased muscle spasms, impaired flexibility, improper body mechanics, postural dysfunction, and pain.    ACTIVITY LIMITATIONS: lifting, squatting, locomotion level, and dressing   PARTICIPATION LIMITATIONS: interpersonal relationship, community activity, and exercise   PERSONAL FACTORS: Past/current experiences and Time since onset of injury/illness/exacerbation are also affecting patient's functional outcome.    REHAB POTENTIAL: Good   CLINICAL DECISION MAKING: Stable/uncomplicated   EVALUATION COMPLEXITY: Low     GOALS:     SHORT TERM GOALS: Target date: 11/17/2023       Pt will become independent with HEP in order to demonstrate synthesis of PT education.   Goal status: met   2. Pt will be able to demonstrate full hip AROM in order to demonstrate functional improvement in LE function for self-care and house hold duties.      Goal status: ongoing   3.  Pt will report at least 2 pt reduction on NPRS scale for pain in order to demonstrate functional improvement with household activity, self care, and ADL.    Goal status: met   LONG TERM GOALS: Target date: 12/29/2023         Pt  will become independent with final HEP in order to demonstrate synthesis of PT education.   Goal status: INITIAL   2.  Pt will score >/= 71 on FOTO to demonstrate improvement in perceived L hip function.      Goal status: INITIAL   3.  Pt will be able to demonstrate 20x 8 box step downs without form deviation or pain in order to demonstrate functional improvement in LE function for return to hopping/jumping.    Goal status: INITIAL  4.  Pt will be able to demonstrate 80% strength with HHD in order to demonstrate functional improvement and tolerance to return to running.    Goal status: INITIAL   5. Pt will be able to demonstrate ability to DL hop without pain in order to demonstrate functional improvement and tolerance to low level plyometric loading.     Goal status: INITIAL        PLAN:   PT FREQUENCY: 1-2x/week   PT DURATION: 12 weeks    PLANNED INTERVENTIONS: Therapeutic exercises, Therapeutic activity, Neuromuscular re-education, Balance training, Gait training, Patient/Family education, Self Care, Joint mobilization, Joint manipulation, Stair training, Aquatic Therapy, Dry Needling, Electrical stimulation, Spinal manipulation, Spinal mobilization, Cryotherapy, Moist heat, scar mobilization, Splintting, Taping, Vasopneumatic device, Traction, Ultrasound, Ionotophoresis 4mg /ml Dexamethasone , Manual therapy, and Re-evaluation   PLAN FOR NEXT SESSION: hip arthroscopy protocol; ROM, muscle activation; STM and joint mobs PRN    Zachary Little, PTA 12/01/2023, 11:00 AM

## 2023-12-03 ENCOUNTER — Encounter: Payer: Self-pay | Admitting: Physical Therapy

## 2023-12-03 ENCOUNTER — Ambulatory Visit (HOSPITAL_BASED_OUTPATIENT_CLINIC_OR_DEPARTMENT_OTHER): Payer: 59 | Admitting: Orthopaedic Surgery

## 2023-12-03 ENCOUNTER — Ambulatory Visit: Payer: 59 | Admitting: Physical Therapy

## 2023-12-03 DIAGNOSIS — G8929 Other chronic pain: Secondary | ICD-10-CM

## 2023-12-03 DIAGNOSIS — M25511 Pain in right shoulder: Secondary | ICD-10-CM | POA: Diagnosis not present

## 2023-12-03 DIAGNOSIS — G2589 Other specified extrapyramidal and movement disorders: Secondary | ICD-10-CM

## 2023-12-03 MED ORDER — LIDOCAINE HCL 1 % IJ SOLN
4.0000 mL | INTRAMUSCULAR | Status: AC | PRN
  Administered 2023-12-03: 4 mL

## 2023-12-03 MED ORDER — TRIAMCINOLONE ACETONIDE 40 MG/ML IJ SUSP
80.0000 mg | INTRAMUSCULAR | Status: AC | PRN
  Administered 2023-12-03: 80 mg via INTRA_ARTICULAR

## 2023-12-03 NOTE — Therapy (Signed)
  OUTPATIENT PHYSICAL THERAPY SCREEN @[address]   Patient Name: Zachary Little MRN: [id number] DOB:30-Jan-1981, 43 y.o., male Today's Date: 12/03/2023  END OF SESSION:  PT End of Session - 12/03/23 1107     Visit Number 1    Activity Tolerance Patient tolerated treatment well    Behavior During Therapy [facility] for tasks assessed/performed             Past Medical History:  Diagnosis Date   Anxiety    Blood transfusion without reported diagnosis 2010   During Chemo Treatments   Hypothyroidism    Leukemia, acute myeloid, in remission (HCC) 2010   Thyroid  disease    Past Surgical History:  Procedure Laterality Date   CYSTECTOMY Left    Scrotum   PORT-A-CATH REMOVAL     WISDOM TOOTH EXTRACTION     Patient Active Problem List   Diagnosis Date Noted   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   Chronic right hip pain 09/23/2023   Labral tear of left hip joint 10/01/2022   DDD (degenerative disc disease), cervical, 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 C6 foraminal stenosis with left-sided periscapular discomfort 04/23/2022   Pes anserinus bursitis of left knee 03/19/2022   Chronic pain syndrome 12/29/2019   Neuropathic pain of thigh, right 12/29/2019   Labral tear of shoulder, right, sequela 10/06/2019   Depression, major, single episode, 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   Erectile dysfunction 03/09/2018   Obesity 03/09/2018   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   Medbridge HEP code:  [email or web address]   added shoulder exercises to existing hip  program [email or web address]  Clinical Impression & Plan:  Pt demo decreased throacic & lumbar curvature with limited mobility on the right side. Hypermobility through bilateral shoulders, tightness in pecs, upper trap & levator. Worked with prone exercises for scapular stability but requires heavy tactile cuing and will benefit from addition of shoulder to current POC. Has had TPDN in the past but denies exercise program to supplement.    Harlene Cordon PT, DPT 12/03/2023, 11:07 AM  [address] [place], [place] [address] [phone]   Note: charges not applied for screen.

## 2023-12-03 NOTE — Progress Notes (Signed)
 Post Operative Evaluation    Procedure/Date of Surgery: Left hip arthroscopy with labral repair and cam debridement 11/4  Presents today for right shoulder pain.  He was scheduled to have a biceps nieces with Dr. Cristy.  At this time he is experiences pain predominantly in the medial border of the scapula.  He is here today for further discussion of the right shoulder  PMH/PSH/Family History/Social History/Meds/Allergies:    Past Medical History:  Diagnosis Date  . Anxiety   . Blood transfusion without reported diagnosis 2010   During Chemo Treatments  . Hypothyroidism   . Leukemia, acute myeloid, in remission (HCC) 2010  . Thyroid  disease    Past Surgical History:  Procedure Laterality Date  . CYSTECTOMY Left    Scrotum  . PORT-A-CATH REMOVAL    . WISDOM TOOTH EXTRACTION     Social History   Socioeconomic History  . Marital status: Married    Spouse name: Not on file  . Number of children: 0  . Years of education: college  . Highest education level: Bachelor's degree (e.g., BA, AB, BS)  Occupational History  . Occupation: Disabled  Tobacco Use  . Smoking status: Never  . Smokeless tobacco: Never  Vaping Use  . Vaping status: Never Used  Substance and Sexual Activity  . Alcohol use: Yes    Comment: maybe one drink every two years  . Drug use: No  . Sexual activity: Yes    Partners: Female  Other Topics Concern  . Not on file  Social History Narrative   Lives at home with his wife.   Left-handed.   No daily caffeine use.    Social Drivers of Health   Financial Resource Strain: Low Risk  (02/14/2023)   Overall Financial Resource Strain (CARDIA)   . Difficulty of Paying Living Expenses: Not hard at all  Food Insecurity: No Food Insecurity (02/14/2023)   Hunger Vital Sign   . Worried About Programme Researcher, Broadcasting/film/video in the Last Year: Never true   . Ran Out of Food in the Last Year: Never true  Transportation Little: No  Transportation Little (02/14/2023)   PRAPARE - Transportation   . Lack of Transportation (Medical): No   . Lack of Transportation (Non-Medical): No  Physical Activity: Insufficiently Active (02/14/2023)   Exercise Vital Sign   . Days of Exercise per Week: 3 days   . Minutes of Exercise per Session: 30 min  Stress: No Stress Concern Present (02/14/2023)   [employer] of Occupational Health - Occupational Stress Questionnaire   . Feeling of Stress : Only a little  Social Connections: Unknown (02/14/2023)   Social Connection and Isolation Panel [NHANES]   . Frequency of Communication with Friends and Family: Once a week   . Frequency of Social Gatherings with Friends and Family: Patient declined   . Attends Religious Services: Never   . Active Member of Clubs or Organizations: No   . Attends Banker Meetings: Not on file   . Marital Status: Married   Family History  Problem Relation Age of Onset  . Hypertension Mother   . Hypertension Father   . Multiple sclerosis Maternal Grandmother   . Dementia Maternal Grandmother   . Diabetes Maternal Grandfather   . Heart attack Maternal Grandfather 80  . Diabetes Paternal Grandfather   .  Thyroid  cancer Maternal Aunt   . Prostate cancer Neg Hx   . Colon cancer Neg Hx    Allergies  Allergen Reactions  . Topamax  [Topiramate ] Other (See Comments)    Anger/irritable  . Trazodone  And Nefazodone Nausea Only   Current Outpatient Medications  Medication Sig Dispense Refill  . acetaminophen  (TYLENOL ) 500 MG tablet Take 1,000 mg by mouth every 6 (six) hours as needed.    . diazepam  (VALIUM ) 5 MG tablet Take 1 tablet (5 mg total) by mouth every 12 (twelve) hours as needed for muscle spasms 30 tablet 0  . ibuprofen (ADVIL) 200 MG tablet Take 200-400 mg by mouth every 6 (six) hours as needed for moderate pain.    . levothyroxine  (SYNTHROID ) 150 MCG tablet Take 1 tablet (150 mcg total) by mouth daily. 90 tablet 3  . Multiple Vitamin  (MULTIVITAMIN WITH MINERALS) TABS tablet Take 1 tablet by mouth daily.     No current facility-administered medications for this visit.   No results found.  Review of Systems:   A ROS was performed including pertinent positives and negatives as documented in the HPI.   Musculoskeletal Exam:    There were no vitals taken for this visit.  Left hip with 30 degrees internal/external rotation of the hip without pain.  Active flexion of the left hip is to 90 degrees.  Mild Trendelenburg gait.  Walks with crutches.  Remainder of distal neurosensory exam is intact with 2+ dorsalis pedis pulse  Right shoulder with tenderness about the medial scapula without palpable clicking.  There is scapular winging as he goes into forward elevation.  Negative O'Brien.  Forward elevation is to 170 degrees with external rotation to 50 degrees  Imaging:      I personally reviewed and interpreted the radiographs.   Assessment:   Presents today for discussion of the right shoulder.  At this time his symptoms are somewhat consistent with scapular dyskinesis and scapular winging.  I would like him to work with physical therapy with this to restore scapular rhythm.  I have also offered him a right subscapular posterior injection today.  She has elected Plan :    -Right shoulder injection provided at the limits obtained     Procedure Note  Patient: Zachary Little             Date of Birth: 1981/02/11           MRN: [id number]             Visit Date: 12/03/2023  Procedures: Visit Diagnoses: No diagnosis found.  Large Joint Inj on 12/03/2023 12:05 PM Indications: pain Details: 22 G 1.5 in needle, ultrasound-guided anterior approach  Arthrogram: No  Medications: 4 mL lidocaine  1 %; 80 mg triamcinolone  acetonide 40 MG/ML Outcome: tolerated well, no immediate complications Procedure, treatment alternatives, risks and benefits explained, specific risks discussed. Consent was given by the patient.  Immediately prior to procedure a time out was called to verify the correct patient, procedure, equipment, support staff and site/side marked as required. Patient was prepped and draped in the usual sterile fashion.         I personally saw and evaluated the patient, and participated in the management and treatment plan.  Elspeth Parker, MD Attending Physician, Orthopedic Surgery  This document was dictated using Dragon voice recognition software. A reasonable attempt at proof reading has been made to minimize errors.

## 2023-12-04 ENCOUNTER — Ambulatory Visit (HOSPITAL_BASED_OUTPATIENT_CLINIC_OR_DEPARTMENT_OTHER): Payer: 59

## 2023-12-04 ENCOUNTER — Encounter (HOSPITAL_BASED_OUTPATIENT_CLINIC_OR_DEPARTMENT_OTHER): Payer: Self-pay

## 2023-12-04 DIAGNOSIS — G2589 Other specified extrapyramidal and movement disorders: Secondary | ICD-10-CM | POA: Diagnosis not present

## 2023-12-04 DIAGNOSIS — M25511 Pain in right shoulder: Secondary | ICD-10-CM | POA: Diagnosis not present

## 2023-12-04 DIAGNOSIS — M25552 Pain in left hip: Secondary | ICD-10-CM | POA: Diagnosis not present

## 2023-12-04 DIAGNOSIS — M6281 Muscle weakness (generalized): Secondary | ICD-10-CM | POA: Diagnosis not present

## 2023-12-04 DIAGNOSIS — G8929 Other chronic pain: Secondary | ICD-10-CM | POA: Diagnosis not present

## 2023-12-04 DIAGNOSIS — R262 Difficulty in walking, not elsewhere classified: Secondary | ICD-10-CM | POA: Diagnosis not present

## 2023-12-04 NOTE — Therapy (Signed)
 OUTPATIENT PHYSICAL THERAPY LOWER EXTREMITY TREATMENT   Patient Name: Zachary Little MRN: [id number] DOB:11-13-1981, 43 y.o., male Today's Date: 12/04/2023  END OF SESSION:  PT End of Session - 12/04/23 0940     Visit Number 10    Number of Visits 26    Date for PT Re-Evaluation 01/04/24    Authorization Type [facility] [employer]    PT Start Time [phone]    PT Stop Time 1013    PT Time Calculation (min) 46 min    Activity Tolerance Patient tolerated treatment well    Behavior During Therapy [facility] for tasks assessed/performed                     Past Medical History:  Diagnosis Date   Anxiety    Blood transfusion without reported diagnosis 2010   During Chemo Treatments   Hypothyroidism    Leukemia, acute myeloid, in remission (HCC) 2010   Thyroid  disease    Past Surgical History:  Procedure Laterality Date   CYSTECTOMY Left    Scrotum   PORT-A-CATH REMOVAL     WISDOM TOOTH EXTRACTION     Patient Active Problem List   Diagnosis Date Noted   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   Chronic right hip pain 09/23/2023   Labral tear of left hip joint 10/01/2022   DDD (degenerative disc disease), cervical, 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 C6 foraminal stenosis with left-sided periscapular discomfort 04/23/2022   Pes anserinus bursitis of left knee 03/19/2022   Chronic pain syndrome 12/29/2019   Neuropathic pain of thigh, right 12/29/2019   Labral tear of shoulder, right, sequela 10/06/2019   Depression, major, single episode, 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   Erectile dysfunction 03/09/2018   Obesity 03/09/2018   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/02/2018   Knee pain 10/31/2017   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 Left hip labral repair Left hip CAM debridement  SUBJECTIVE:   SUBJECTIVE STATEMENT:  Pt reports his groin area does not feel as tight. Still having tightness in anterolateral hip however. No pain at entry. Saw MD recently for assessment of R shoulder pain. Was dx with scapular dyskinesia. MD referred pt to PT to address this in addition to L hip. He c/o ongoing pain in triggerpoint in R shoulder blade. Received injection here at last MD visit without improvement. He has been using theracane.   Eval:  Pt has been compliant with WB precautions. Pt has been resting and protecting the hip appropriately. The lateral thigh on the L is aching and painful on occasion. Pt has been using aspirin and icing as directed. Denies NT. Denies s/s of infection. Pt states he has not been icing as much recently. Was not provided a brace post-op.   PERTINENT HISTORY: N/A PAIN:  Are you having pain? No: NPRS scale: 0/10 Pain location: L lateral Pain description: aching Aggravating factors: movement  Relieving factors: rest/ meds   PRECAUTIONS: Other: L hip labral repair  RED FLAGS:  None   WEIGHT BEARING RESTRICTIONS: Yes TTWB for first two weeks  FALLS:  Has patient fallen in last 6 months? No  LIVING ENVIRONMENT: Lives with: lives with their family Lives in: House/apartment Stairs: 2nd story home Has following equipment at home: Crutches  OCCUPATION: N/A  Running- flat road with occasional trails/hills  PLOF: Independent  PATIENT GOALS: be able to run about 3 miles again     OBJECTIVE:  Note: Objective measures were completed at Evaluation unless otherwise noted.  DIAGNOSTIC FINDINGS: N/A  PATIENT  SURVEYS:  FOTO 30 71 @ DC 19 pts MCII 69% on 12/16   COGNITION: Overall cognitive status: Within functional limits for tasks assessed                         SENSATION: WFL   MUSCLE LENGTH: Hamstrings: positive supine 90/90   POSTURE: No Significant postural limitations   PALPATION: NT around incision sites, no erythema, no signs of drainage, Tegaderm bandage in place   LOWER EXTREMITY ROM:   Passive ROM Right eval Left eval  Hip flexion WFL 75  Hip extension WFL 0  Hip abduction WFL 30  Hip adduction      Hip internal rotation WFL 30  Hip external rotation WFL 20  Knee flexion WFL 130  Knee extension WFL 0  Ankle dorsiflexion      Ankle plantarflexion      Ankle inversion      Ankle eversion       (Blank rows = not tested)   LOWER EXTREMITY MMT: Not tested 2/2 surgical precautions   GAIT: Distance walked: 17ft Assistive device utilized: Crutches Level of assistance: Complete Independence Comments: TTWB     TODAY'S TREATMENT:                                                                                                                              DATE:    1/9  Recumbent bike Lvl 1 6 min  STM to proximal lateral quad TPR to R periscapular mm  Seated scap retraction 5 x20  Single leg bridge 5 2x10 L LE Supine SLR 2x10 Sidelying hip abduction 3x10 Runner step up 8 x30L Static lunges at rail 2x10ea  Squats 2x10 with 3 seconds hold, seconds set (only 9 reps with second set, pt fatigued and lost balance, falling to ground from squat position. Did not hurt self and able to stand up quickly and independently.  Able to stand independently following this.) SLS on airex with alternating lateral reaches x10ea LAQ 5# 5 2x10    1/6  Recumbent bike Lvl 1 6 min  STM to proximal hip flexors  Supine clams/bridge- 5 clams with GTB followed by 5 bridge x10ea Single leg bridge 3 2x10 L LE Supine SLR 2x10 Sidelying hip abduction 3x10 LAQ 5# 5  2x15 Standing resisted hip flexion SLR  Runner step up 8 x30L Static lunges at rail x10ea  Squats x10 (tried 3 second hold but pt reports significant fatigue)  SLS on airex with alternating lateral reaches 2x5ea   12/30  Recumbent bike Lvl 1 6 min Bar assist deep squat 3s pause 3x8 Lateral lunge stretch 30s 3x Lateral lunge at rail 2x10 Sidestepping with GTB at ankles 35 ft 3x SLS with 10lb weight pass 3x20 8 step up 2x10 lateral  12/19  Recumbent bike Lvl 4 8 min  BFR leg press on shuttle: 30, 15, 15, 15 (30s rest between) 75lbs staggered stance  BFR knee ext 5lbs 30x, 15, 15, 15  Kneeling plank 30s 3x Sidestepping with RTB at knees 35 ft 3x Squats with GTB at knees 4x8 8 step up 2x10 Hip flexor stretch 30s 2x     PATIENT EDUCATION:  Education details: anatomy, exercise progression, DOMS expectations, muscle firing,  envelope of function, HEP, POC Person educated: Patient Education method: Explanation, Demonstration, Tactile cues, Verbal cues, and Handouts Education comprehension: verbalized understanding, returned demonstration, verbal cues required, and tactile cues required   HOME EXERCISE PROGRAM:   Access Code: [email or web address] (prefers print) URL: [email or web address] Date: 10/06/2023 Prepared by: Dale Call    ASSESSMENT:   CLINICAL IMPRESSION: Pt continues to be limited due to significant pain in R scapular region. Pt saw MD for this yesterday who added shoulder dx to POC. Pt assessed by PT at that time as well. Pt to have formal re-eval next visit. May benefit from DN. Today focused on L hip strengthening, ROM, and TPR to R periscapular region. Pt especially chanlleneged due to L LE weakness with squats and lunges. With second set of squats on 9th rep, pt unable to return to standing from end range position due to fatigue and sat down onto floor. Pt able to stand back up quickly and independently without difficulty or pain. Incident report will be  documented to record this. Pt will benefit from continued LE strengthening to address ongoing weakness. Formal re-eval for R shoulder to take place next visit.    OBJECTIVE IMPAIRMENTS: decreased activity tolerance, decreased balance, decreased endurance, decreased mobility, decreased ROM, decreased strength, hypomobility, increased muscle spasms, impaired flexibility, improper body mechanics, postural dysfunction, and pain.    ACTIVITY LIMITATIONS: lifting, squatting, locomotion level, and dressing   PARTICIPATION LIMITATIONS: interpersonal relationship, community activity, and exercise   PERSONAL FACTORS: Past/current experiences and Time since onset of injury/illness/exacerbation are also affecting patient's functional outcome.    REHAB POTENTIAL: Good   CLINICAL DECISION MAKING: Stable/uncomplicated   EVALUATION COMPLEXITY: Low     GOALS:     SHORT TERM GOALS: Target date: 11/17/2023       Pt will become independent with HEP in order to demonstrate synthesis of PT education.   Goal status: met   2. Pt will be able to demonstrate full hip AROM in order to demonstrate functional improvement in LE function for self-care and house hold duties.      Goal status: ongoing   3.  Pt will report at least 2 pt reduction on NPRS scale for pain in order to demonstrate functional improvement with household activity, self care, and ADL.    Goal status: met   LONG TERM GOALS: Target date: 12/29/2023         Pt  will become independent with final HEP in order to demonstrate synthesis of PT education.   Goal status: INITIAL   2.  Pt will score >/= 71 on FOTO to demonstrate improvement in perceived L hip function.  Goal status: INITIAL   3.  Pt will be able to demonstrate 20x 8 box step downs without form deviation or pain in order to demonstrate functional improvement in LE function for return to hopping/jumping.    Goal status: INITIAL   4.  Pt will be able to demonstrate  80% strength with HHD in order to demonstrate functional improvement and tolerance to return to running.    Goal status: INITIAL   5. Pt will be able to demonstrate ability to DL hop without pain in order to demonstrate functional improvement and tolerance to low level plyometric loading.     Goal status: INITIAL       PLAN:   PT FREQUENCY: 1-2x/week   PT DURATION: 12 weeks    PLANNED INTERVENTIONS: Therapeutic exercises, Therapeutic activity, Neuromuscular re-education, Balance training, Gait training, Patient/Family education, Self Care, Joint mobilization, Joint manipulation, Stair training, Aquatic Therapy, Dry Needling, Electrical stimulation, Spinal manipulation, Spinal mobilization, Cryotherapy, Moist heat, scar mobilization, Splintting, Taping, Vasopneumatic device, Traction, Ultrasound, Ionotophoresis 4mg /ml Dexamethasone , Manual therapy, and Re-evaluation   PLAN FOR NEXT SESSION: hip arthroscopy protocol; ROM, muscle activation; STM and joint mobs PRN    Asberry BRAVO Aranda Bihm, PTA 12/04/2023, 2:20 PM

## 2023-12-08 ENCOUNTER — Ambulatory Visit (HOSPITAL_BASED_OUTPATIENT_CLINIC_OR_DEPARTMENT_OTHER): Payer: 59 | Admitting: Physical Therapy

## 2023-12-08 ENCOUNTER — Encounter (HOSPITAL_BASED_OUTPATIENT_CLINIC_OR_DEPARTMENT_OTHER): Payer: Self-pay | Admitting: Physical Therapy

## 2023-12-08 DIAGNOSIS — M25511 Pain in right shoulder: Secondary | ICD-10-CM | POA: Diagnosis not present

## 2023-12-08 DIAGNOSIS — G8929 Other chronic pain: Secondary | ICD-10-CM | POA: Diagnosis not present

## 2023-12-08 DIAGNOSIS — M25552 Pain in left hip: Secondary | ICD-10-CM | POA: Diagnosis not present

## 2023-12-08 DIAGNOSIS — G2589 Other specified extrapyramidal and movement disorders: Secondary | ICD-10-CM | POA: Diagnosis not present

## 2023-12-08 DIAGNOSIS — M6281 Muscle weakness (generalized): Secondary | ICD-10-CM

## 2023-12-08 DIAGNOSIS — R262 Difficulty in walking, not elsewhere classified: Secondary | ICD-10-CM | POA: Diagnosis not present

## 2023-12-08 NOTE — Therapy (Signed)
 OUTPATIENT PHYSICAL THERAPY SHOULDER EVALUATION   Patient Name: Zachary Little MRN: [id number] DOB:1981/10/01, 43 y.o., male Today's Date: 12/08/2023  END OF SESSION:  PT End of Session - 12/08/23 1509     Visit Number 11    Number of Visits 26    Date for PT Re-Evaluation 03/07/24    Authorization Type [facility] [employer]    PT Start Time 1400    PT Stop Time 1445    PT Time Calculation (min) 45 min    Activity Tolerance Patient tolerated treatment well    Behavior During Therapy [facility] for tasks assessed/performed                      Past Medical History:  Diagnosis Date   Anxiety    Blood transfusion without reported diagnosis 2010   During Chemo Treatments   Hypothyroidism    Leukemia, acute myeloid, in remission (HCC) 2010   Thyroid  disease    Past Surgical History:  Procedure Laterality Date   CYSTECTOMY Left    Scrotum   PORT-A-CATH REMOVAL     WISDOM TOOTH EXTRACTION     Patient Active Problem List   Diagnosis Date Noted   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   Chronic right hip pain 09/23/2023   Labral tear of left hip joint 10/01/2022   DDD (degenerative disc disease), cervical, 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 C6 foraminal stenosis with left-sided periscapular discomfort 04/23/2022   Pes anserinus bursitis of left knee 03/19/2022   Chronic pain syndrome 12/29/2019   Neuropathic pain of thigh, right 12/29/2019   Labral tear of shoulder, right, sequela 10/06/2019   Depression, major, single episode, 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   Erectile dysfunction 03/09/2018   Obesity 03/09/2018   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/02/2018   Knee pain 10/31/2017   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) 05/16/2015   Hypothyroidism (acquired) 05/16/2015   Vitamin B12 deficiency 10/14/2011    PCP: Job Lukes, PA   REFERRING PROVIDER:  Genelle Standing, MD     REFERRING DIAG:  M25.859 (ICD-10-CM) -  Femoroacetabular impingement      THERAPY DIAG:  Chronic right shoulder pain - Plan: PT plan of care cert/re-cert  Pain in left hip - Plan: PT plan of care cert/re-cert  Muscle weakness (generalized) - Plan: PT plan of care cert/re-cert  Difficulty walking - Plan: PT plan of care cert/re-cert  Rationale for Evaluation and Treatment: Rehabilitation  ONSET DATE: 09/29/2023 DOS Days since surgery: 70   PROCEDURE: 1. Left hip labral repair Left hip CAM debridement  SUBJECTIVE:   SUBJECTIVE STATEMENT:  Pt is here for the R should evaluation today. Pt notes that the R shoulder blade will hurt up to a 6/10 as it goes into adduction as he is sleeping. Unable to side sleep anymore. Pt states the the shoulder hurts with walking and it is creating pain from just sitting at his side. Pt did have an MRI that did show labral tear. Pt states that that shoulder pain has been ongoing off and on for about 5 years (beyond time of imaging) Jogging does not increase pain.  Lifting weights did not cause pain. Denies NT.  Denies history of neck involvement/pain. Pt states that the pain will also feel it into the triceps. Feels similar aching sensation. Pt is L hand dominant. Has not noticed weakness into the R UE. No dropping of items. Movement during ADL cannot recreate pain.   Pt has  had an MRI that shows C6 stenosis. He has been doing light HEP for the neck and shoulders: ROM  Eval:  Pt has been compliant with WB precautions. Pt has been resting and protecting the hip appropriately. The lateral thigh on the L is aching and painful on occasion. Pt has been using aspirin and icing as directed. Denies NT. Denies s/s of infection. Pt states he has not been icing as much recently. Was not provided a brace post-op.   PERTINENT HISTORY: N/A PAIN:   PAIN:  Are you having pain? No none at rest but does come on with sleep and walking VAS scale: 0/10 Pain location:  R rhomboid area Pain description:aching   Aggravating factors: walking,cross body adduction Relieving factors: massage/theracane   Are you having pain? No: NPRS scale: 0/10 Pain location: L lateral Pain description: aching Aggravating factors: movement  Relieving factors: rest/ meds   PRECAUTIONS: Other: L hip labral repair  RED FLAGS: None   WEIGHT BEARING RESTRICTIONS: Yes TTWB for first two weeks  FALLS:  Has patient fallen in last 6 months? No  LIVING ENVIRONMENT: Lives with: lives with their family Lives in: House/apartment Stairs: 2nd story home Has following equipment at home: Crutches  OCCUPATION: N/A  Running- flat road with occasional trails/hills  PLOF: Independent  PATIENT GOALS: be able to run about 3 miles again; sleep on shoulder/reduce shoulder pain   OBJECTIVE:  Note: Objective measures were completed at Evaluation unless otherwise noted.  DIAGNOSTIC FINDINGS:   FINDINGS: Rotator cuff:  Intact without significant tendinosis.   Muscles:  No focal muscular atrophy or edema.   Biceps long head:  Intact and normally positioned.   Acromioclavicular Joint: Normal acromioclavicular joint. No significant fluid or contrast is present in the subacromial/subdeltoid bursa.   Glenohumeral Joint: Distended with intra-articular contrast. No chondral defect.   Labrum: Superior labral tear (series 5, image 10). Remaining labrum is intact.   Bones: No acute fracture or dislocation. No suspicious bone lesion.   Other: None.   IMPRESSION: 1. Superior labral tear.  IMPRESSION: 1. Broad-based posterior disc bulge with uncovertebral spurring at C5-6 with resultant moderate spinal stenosis, with mild left C6 foraminal narrowing. 2. Additional mild noncompressive disc bulging at C4-5 and C6-7 without stenosis or impingement.  MMT in lbs  Right 1/13  Left 1/13  Shoulder flexion 39.6 49.3  Shoulder extension    Shoulder abduction 34.5 37.0  Shoulder protaction 34.3 40.8  Shoulder adduction     Shoulder internal rotation 5/5 5/5  Shoulder external rotation 4+/5 4+/5  (Blank rows = not tested)   Shoulder AROM symmetrical and WFL bilat; no recreation of pain    CERVICAL ROM:   Active ROM A/PROM (deg) eval  Flexion 100%  Extension 60%  Right lateral flexion 75% R p!  Left lateral flexion 75%  Right rotation 80%  Left rotation 80%   (Blank rows = not tested)    C/S joint mobility: lack of R side glide and ext   T/S joint moiblity: significantly hypomobile with ext and rotation joint glides      TODAY'S TREATMENT:  DATE:   1/13  T4-10 CPA grade IV C3-6 UPA on R grade III   Exercises - Thoracic Extension Mobilization on Foam Roll  - 1 x daily - 7 x weekly - 2 sets - 10 reps - 2s hold - Shoulder External Rotation and Scapular Retraction with Resistance  - 3-4 x daily - 7 x weekly - 1 sets - 10 reps   1/9  Recumbent bike Lvl 1 6 min  STM to proximal lateral quad TPR to R periscapular mm  Seated scap retraction 5 x20  Single leg bridge 5 2x10 L LE Supine SLR 2x10 Sidelying hip abduction 3x10 Runner step up 8 x30L Static lunges at rail 2x10ea  Squats 2x10 with 3 seconds hold, seconds set (only 9 reps with second set, pt fatigued and lost balance, falling to ground from squat position. Did not hurt self and able to stand up quickly and independently.  Able to stand independently following this.) SLS on airex with alternating lateral reaches x10ea LAQ 5# 5 2x10    1/6  Recumbent bike Lvl 1 6 min  STM to proximal hip flexors  Supine clams/bridge- 5 clams with GTB followed by 5 bridge x10ea Single leg bridge 3 2x10 L LE Supine SLR 2x10 Sidelying hip abduction 3x10 LAQ 5# 5 2x15 Standing resisted hip flexion SLR  Runner step up 8 x30L Static lunges at rail x10ea  Squats x10 (tried 3 second hold but pt reports  significant fatigue)  SLS on airex with alternating lateral reaches 2x5ea   12/30  Recumbent bike Lvl 1 6 min Bar assist deep squat 3s pause 3x8 Lateral lunge stretch 30s 3x Lateral lunge at rail 2x10 Sidestepping with GTB at ankles 35 ft 3x SLS with 10lb weight pass 3x20 8 step up 2x10 lateral  12/19  Recumbent bike Lvl 4 8 min  BFR leg press on shuttle: 30, 15, 15, 15 (30s rest between) 75lbs staggered stance  BFR knee ext 5lbs 30x, 15, 15, 15  Kneeling plank 30s 3x Sidestepping with RTB at knees 35 ft 3x Squats with GTB at knees 4x8 8 step up 2x10 Hip flexor stretch 30s 2x     PATIENT EDUCATION:  Education details: anatomy, exercise progression, DOMS expectations, muscle firing,  envelope of function, HEP, POC Person educated: Patient Education method: Explanation, Demonstration, Tactile cues, Verbal cues, and Handouts Education comprehension: verbalized understanding, returned demonstration, verbal cues required, and tactile cues required   HOME EXERCISE PROGRAM:   Access Code: [email or web address] (prefers print) URL: [email or web address] Date: 10/06/2023 Prepared by: Dale Call    Access Code: [phone] (shoulder; texted to pt)  URL: [email or web address] Date: 12/08/2023 Prepared by: Dale Call    ASSESSMENT:   CLINICAL IMPRESSION: Re-evaluation today for L shoulder to be combined with hip case. Pt's s/s appear consistent with thoracic/mid back type pain due to static positioning and positional, static over-lengthening of scapular retractors. Pain into R triceps appears to be referral pain vs cervicogenic. Cervical testing does not recreated distal pain. Pt is very stiff into T/S with significant lack of ext. Pt tends to sit with shoulder rounded and IR which may be contributing to discomfort on L. Plan to continue with thoracic mobility and general scapular retractor strengthening at future sessions.    OBJECTIVE IMPAIRMENTS: decreased  activity tolerance, decreased balance, decreased endurance, decreased mobility, decreased ROM, decreased strength, hypomobility, increased muscle spasms, impaired flexibility, improper body mechanics, postural dysfunction, and pain.    ACTIVITY LIMITATIONS: lifting, squatting, locomotion level, and dressing  PARTICIPATION LIMITATIONS: interpersonal relationship, community activity, and exercise   PERSONAL FACTORS: Past/current experiences and Time since onset of injury/illness/exacerbation are also affecting patient's functional outcome.    REHAB POTENTIAL: Good   CLINICAL DECISION MAKING: Stable/uncomplicated   EVALUATION COMPLEXITY: Low     GOALS:     SHORT TERM GOALS: Target date: 11/17/2023       Pt will become independent with HEP in order to demonstrate synthesis of PT education.   Goal status: met   2. Pt will be able to demonstrate full hip AROM in order to demonstrate functional improvement in LE function for self-care and house hold duties.      Goal status: ongoing   3.  Pt will report at least 2 pt reduction on NPRS scale for pain in order to demonstrate functional improvement with household activity, self care, and ADL.    Goal status: met  4. Pt will report at least 2 pt reduction on NPRS scale for pain in order to demonstrate functional improvement with household activity, self care, and ADL.   Goal status: INITIAL   LONG TERM GOALS: Target date: 12/29/2023         Pt  will become independent with final HEP in order to demonstrate synthesis of PT education.   Goal status: INITIAL   2.  Pt will score >/= 71 on FOTO to demonstrate improvement in perceived L hip function.      Goal status: INITIAL   3.  Pt will be able to demonstrate 20x 8 box step downs without form deviation or pain in order to demonstrate functional improvement in LE function for return to hopping/jumping.    Goal status: INITIAL   4.  Pt will be able to demonstrate 80% strength  with HHD in order to demonstrate functional improvement and tolerance to return to running.    Goal status: INITIAL   5. Pt will be able to demonstrate ability to DL hop without pain in order to demonstrate functional improvement and tolerance to low level plyometric loading.     Goal status: INITIAL  6. Pt will be able to demonstrate/report ability to sit/stand/sleep for extended periods of time without pain in order to demonstrate functional improvement and tolerance to static positioning.   Goal status: INITIAL       PLAN:   PT FREQUENCY: 1-2x/week   PT DURATION: 12 weeks (combine with hip labral repair episode)   PLANNED INTERVENTIONS: Therapeutic exercises, Therapeutic activity, Neuromuscular re-education, Balance training, Gait training, Patient/Family education, Self Care, Joint mobilization, Joint manipulation, Stair training, Aquatic Therapy, Dry Needling, Electrical stimulation, Spinal manipulation, Spinal mobilization, Cryotherapy, Moist heat, scar mobilization, Splintting, Taping, Vasopneumatic device, Traction, Ultrasound, Ionotophoresis 4mg /ml Dexamethasone , Manual therapy, and Re-evaluation   PLAN FOR NEXT SESSION: hip arthroscopy protocol; ROM, muscle activation; STM and joint mobs PRN    Dale Call, PT 12/08/2023, 3:36 PM

## 2023-12-12 ENCOUNTER — Ambulatory Visit (HOSPITAL_BASED_OUTPATIENT_CLINIC_OR_DEPARTMENT_OTHER): Payer: 59

## 2023-12-12 ENCOUNTER — Encounter (HOSPITAL_BASED_OUTPATIENT_CLINIC_OR_DEPARTMENT_OTHER): Payer: Self-pay

## 2023-12-12 DIAGNOSIS — G8929 Other chronic pain: Secondary | ICD-10-CM

## 2023-12-12 DIAGNOSIS — M6281 Muscle weakness (generalized): Secondary | ICD-10-CM

## 2023-12-12 DIAGNOSIS — M25552 Pain in left hip: Secondary | ICD-10-CM | POA: Diagnosis not present

## 2023-12-12 DIAGNOSIS — G2589 Other specified extrapyramidal and movement disorders: Secondary | ICD-10-CM | POA: Diagnosis not present

## 2023-12-12 DIAGNOSIS — R262 Difficulty in walking, not elsewhere classified: Secondary | ICD-10-CM

## 2023-12-12 DIAGNOSIS — M25511 Pain in right shoulder: Secondary | ICD-10-CM | POA: Diagnosis not present

## 2023-12-12 NOTE — Therapy (Signed)
OUTPATIENT PHYSICAL THERAPY SHOULDER TREATMENT   Patient Name: Mathhew Doody MRN: [id number] DOB:1981/03/16, 43 y.o., male Today's Date: 12/12/2023  END OF SESSION:  PT End of Session - 12/12/23 1555     Visit Number 12    Number of Visits 26    Date for PT Re-Evaluation 03/07/24    Authorization Type [facility] [employer]    PT Start Time 1602    PT Stop Time 1644    PT Time Calculation (min) 42 min    Activity Tolerance Patient tolerated treatment well    Behavior During Therapy [facility] for tasks assessed/performed                       Past Medical History:  Diagnosis Date   Anxiety    Blood transfusion without reported diagnosis 2010   During Chemo Treatments   Hypothyroidism    Leukemia, acute myeloid, in remission (HCC) 2010   Thyroid disease    Past Surgical History:  Procedure Laterality Date   CYSTECTOMY Left    Scrotum   PORT-A-CATH REMOVAL     WISDOM TOOTH EXTRACTION     Patient Active Problem List   Diagnosis Date Noted   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   Chronic right hip pain 09/23/2023   Labral tear of left hip joint 10/01/2022   DDD (degenerative disc disease), cervical, 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 C6 foraminal stenosis with left-sided periscapular discomfort 04/23/2022   Pes anserinus bursitis of left knee 03/19/2022   Chronic pain syndrome 12/29/2019   Neuropathic pain of thigh, right 12/29/2019   Labral tear of shoulder, right, sequela 10/06/2019   Depression, major, single episode, 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   Erectile dysfunction 03/09/2018   Obesity 03/09/2018   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/02/2018   Knee pain 10/31/2017   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 Left hip labral repair Left hip CAM debridement  SUBJECTIVE:   SUBJECTIVE STATEMENT:  Pt reports some improvement in R shoulder pain since last session. Feels the thoracic extension stretching has helped. Hip feels better, thinks the squats are helping. No pain in hip or shoulder at entry.   Eval: Pt is here for the R should evaluation today. Pt notes that the R shoulder blade will hurt up to a 6/10 as it goes into adduction as he is sleeping. Unable to side sleep anymore. Pt states the the shoulder hurts with walking and it is creating pain from just sitting at his side. Pt did have an MRI that did show labral tear. Pt states that that shoulder pain has been ongoing off and on for about 5 years (beyond time of imaging) Jogging does not increase pain.  Lifting weights did not cause pain. Denies NT.  Denies history of neck involvement/pain. Pt states that the pain will also feel it into the triceps. Feels similar aching sensation. Pt is L hand dominant. Has not noticed weakness into the R UE. No dropping of items.  Movement during ADL cannot recreate pain.   Pt has had an MRI that shows C6 stenosis. He has been doing light HEP for the neck and shoulders: ROM  Eval:  Pt has been compliant with WB precautions. Pt has been resting and protecting the hip appropriately. The lateral thigh on the L is aching and painful on occasion. Pt has been using aspirin and icing as directed. Denies NT. Denies s/s of infection. Pt states he has not been icing as much recently. Was not provided a brace post-op.   PERTINENT HISTORY: N/A PAIN:   PAIN:  Are you having pain? No none at rest but does come on with sleep and walking VAS  scale: 0/10 Pain location:  R rhomboid area Pain description:aching  Aggravating factors: walking,cross body adduction Relieving factors: massage/theracane   Are you having pain? No: NPRS scale: 0/10 Pain location: L lateral Pain description: aching Aggravating factors: movement  Relieving factors: rest/ meds   PRECAUTIONS: Other: L hip labral repair  RED FLAGS: None   WEIGHT BEARING RESTRICTIONS: Yes TTWB for first two weeks  FALLS:  Has patient fallen in last 6 months? No  LIVING ENVIRONMENT: Lives with: lives with their family Lives in: House/apartment Stairs: 2nd story home Has following equipment at home: Crutches  OCCUPATION: N/A  Running- flat road with occasional trails/hills  PLOF: Independent  PATIENT GOALS: be able to run about 3 miles again; sleep on shoulder/reduce shoulder pain   OBJECTIVE:  Note: Objective measures were completed at Evaluation unless otherwise noted.  DIAGNOSTIC FINDINGS:   FINDINGS: Rotator cuff:  Intact without significant tendinosis.   Muscles:  No focal muscular atrophy or edema.   Biceps long head:  Intact and normally positioned.   Acromioclavicular Joint: Normal acromioclavicular joint. No significant fluid or contrast is present in the subacromial/subdeltoid bursa.   Glenohumeral Joint: Distended with intra-articular contrast. No chondral defect.   Labrum: Superior labral tear (series 5, image 10). Remaining labrum is intact.   Bones: No acute fracture or dislocation. No suspicious bone lesion.   Other: None.   IMPRESSION: 1. Superior labral tear.  IMPRESSION: 1. Broad-based posterior disc bulge with uncovertebral spurring at C5-6 with resultant moderate spinal stenosis, with mild left C6 foraminal narrowing. 2. Additional mild noncompressive disc bulging at C4-5 and C6-7 without stenosis or impingement.  MMT in lbs  Right 1/13  Left 1/13  Shoulder flexion 39.6 49.3  Shoulder extension    Shoulder  abduction 34.5 37.0  Shoulder protaction 34.3 40.8  Shoulder adduction    Shoulder internal rotation 5/5 5/5  Shoulder external rotation 4+/5 4+/5  (Blank rows = not tested)   Shoulder AROM symmetrical and WFL bilat; no recreation of pain    CERVICAL ROM:   Active ROM A/PROM (deg) eval  Flexion 100%  Extension 60%  Right lateral flexion 75% R p!  Left lateral flexion 75%  Right rotation 80%  Left rotation 80%   (Blank rows = not tested)    C/S joint mobility: lack of R side glide and ext   T/S joint moiblity: significantly hypomobile with ext and rotation joint glides      TODAY'S TREATMENT:  DATE:   1/17 FOTO 80% STM to R upper traps Cupping to R UT  Bil ER in standing YTB 2x10 Serratus flexion with YTB x10 Supine thoracic ext over 1/2 roll x10 Supine horizontal abd RTB 2x10  Single leg bridge 3" 2x10 L LE Runner step up with heel raise 8" x20 Staggered sit to stands from elevated plinth 2x5 (very fatiguing) Standing hip abd RTB at ankles 2x10ea   1/13  T4-10 CPA grade IV C3-6 UPA on R grade III   Exercises - Thoracic Extension Mobilization on Foam Roll  - 1 x daily - 7 x weekly - 2 sets - 10 reps - 2s hold - Shoulder External Rotation and Scapular Retraction with Resistance  - 3-4 x daily - 7 x weekly - 1 sets - 10 reps   1/9  Recumbent bike Lvl 1 6 min  STM to proximal lateral quad TPR to R periscapular mm  Seated scap retraction 5" x20  Single leg bridge 5" 2x10 L LE Supine SLR 2x10 Sidelying hip abduction 3x10 Runner step up 8" x30L Static lunges at rail 2x10ea  Squats 2x10 with 3 seconds hold, seconds set (only 9 reps with second set, pt fatigued and lost balance, falling to ground from squat position. Did not hurt self and able to stand up quickly and independently.  Able to stand independently following this.) SLS  on airex with alternating lateral reaches x10ea LAQ 5# 5" 2x10    1/6  Recumbent bike Lvl 1 6 min  STM to proximal hip flexors  Supine clams/bridge- 5 clams with GTB followed by 5" bridge x10ea Single leg bridge 3" 2x10 L LE Supine SLR 2x10 Sidelying hip abduction 3x10 LAQ 5# 5" 2x15 Standing resisted hip flexion SLR  Runner step up 8" x30L Static lunges at rail x10ea  Squats x10 (tried 3 second hold but pt reports significant fatigue)  SLS on airex with alternating lateral reaches 2x5ea   12/30  Recumbent bike Lvl 1 6 min Bar assist deep squat 3s pause 3x8 Lateral lunge stretch 30s 3x Lateral lunge at rail 2x10 Sidestepping with GTB at ankles 35 ft 3x SLS with 10lb weight pass 3x20 8" step up 2x10 lateral    PATIENT EDUCATION:  Education details: anatomy, exercise progression, DOMS expectations, muscle firing,  envelope of function, HEP, POC Person educated: Patient Education method: Explanation, Demonstration, Tactile cues, Verbal cues, and Handouts Education comprehension: verbalized understanding, returned demonstration, verbal cues required, and tactile cues required   HOME EXERCISE PROGRAM:   Access Code: [email or web address] (prefers print) URL: [email or web address] Date: 10/06/2023 Prepared by: Zebedee Iba    Access Code: [email or web address] (shoulder; texted to pt)  URL: [email or web address] Date: 12/08/2023 Prepared by: Zebedee Iba    ASSESSMENT:   CLINICAL IMPRESSION: Performed STM/TPR as well as static cupping to R UT to address trigger points in this area. Responded well to scapular strengthening, though did have some discomfort with resisted SA raise using YTB. Pt remains very challenged by stagered sit to stands, only able to perform sets of 5 reps to elevated plinth. Pt also fatigued with resisted standing hip abduction, though able to complete all reps. Will continue to monitor symptoms and progress as tolerated.    Re-evaluation today  for L shoulder to be combined with hip case. Pt's s/s appear consistent with thoracic/mid back type pain due to static positioning and positional, static over-lengthening of scapular retractors. Pain into R triceps appears to be referral pain vs cervicogenic. Cervical testing does not recreated distal  pain. Pt is very stiff into T/S with significant lack of ext. Pt tends to sit with shoulder rounded and IR which may be contributing to discomfort on L. Plan to continue with thoracic mobility and general scapular retractor strengthening at future sessions.    OBJECTIVE IMPAIRMENTS: decreased activity tolerance, decreased balance, decreased endurance, decreased mobility, decreased ROM, decreased strength, hypomobility, increased muscle spasms, impaired flexibility, improper body mechanics, postural dysfunction, and pain.    ACTIVITY LIMITATIONS: lifting, squatting, locomotion level, and dressing   PARTICIPATION LIMITATIONS: interpersonal relationship, community activity, and exercise   PERSONAL FACTORS: Past/current experiences and Time since onset of injury/illness/exacerbation are also affecting patient's functional outcome.    REHAB POTENTIAL: Good   CLINICAL DECISION MAKING: Stable/uncomplicated   EVALUATION COMPLEXITY: Low     GOALS:     SHORT TERM GOALS: Target date: 11/17/2023       Pt will become independent with HEP in order to demonstrate synthesis of PT education.   Goal status: met   2. Pt will be able to demonstrate full hip AROM in order to demonstrate functional improvement in LE function for self-care and house hold duties.      Goal status: ongoing   3.  Pt will report at least 2 pt reduction on NPRS scale for pain in order to demonstrate functional improvement with household activity, self care, and ADL.    Goal status: met  4. Pt will report at least 2 pt reduction on NPRS scale for pain in order to demonstrate functional improvement with household activity, self  care, and ADL.   Goal status: INITIAL   LONG TERM GOALS: Target date: 12/29/2023         Pt  will become independent with final HEP in order to demonstrate synthesis of PT education.   Goal status: INITIAL   2.  Pt will score >/= 71 on FOTO to demonstrate improvement in perceived L hip function.      Goal status: INITIAL   3.  Pt will be able to demonstrate 20x 8" box step downs without form deviation or pain in order to demonstrate functional improvement in LE function for return to hopping/jumping.    Goal status: INITIAL   4.  Pt will be able to demonstrate 80% strength with HHD in order to demonstrate functional improvement and tolerance to return to running.    Goal status: INITIAL   5. Pt will be able to demonstrate ability to DL hop without pain in order to demonstrate functional improvement and tolerance to low level plyometric loading.     Goal status: INITIAL  6. Pt will be able to demonstrate/report ability to sit/stand/sleep for extended periods of time without pain in order to demonstrate functional improvement and tolerance to static positioning.   Goal status: INITIAL       PLAN:   PT FREQUENCY: 1-2x/week   PT DURATION: 12 weeks (combine with hip labral repair episode)   PLANNED INTERVENTIONS: Therapeutic exercises, Therapeutic activity, Neuromuscular re-education, Balance training, Gait training, Patient/Family education, Self Care, Joint mobilization, Joint manipulation, Stair training, Aquatic Therapy, Dry Needling, Electrical stimulation, Spinal manipulation, Spinal mobilization, Cryotherapy, Moist heat, scar mobilization, Splintting, Taping, Vasopneumatic device, Traction, Ultrasound, Ionotophoresis 4mg /ml Dexamethasone, Manual therapy, and Re-evaluation   PLAN FOR NEXT SESSION: hip arthroscopy protocol; ROM, muscle activation; STM and joint mobs PRN    Donnel Saxon Alyiah Ulloa, PTA 12/12/2023, 4:52 PM

## 2023-12-18 ENCOUNTER — Ambulatory Visit (HOSPITAL_BASED_OUTPATIENT_CLINIC_OR_DEPARTMENT_OTHER): Payer: 59

## 2023-12-24 ENCOUNTER — Ambulatory Visit (HOSPITAL_BASED_OUTPATIENT_CLINIC_OR_DEPARTMENT_OTHER): Payer: 59 | Admitting: Orthopaedic Surgery

## 2023-12-24 DIAGNOSIS — M25512 Pain in left shoulder: Secondary | ICD-10-CM

## 2023-12-24 DIAGNOSIS — H5211 Myopia, right eye: Secondary | ICD-10-CM | POA: Diagnosis not present

## 2023-12-24 DIAGNOSIS — G2589 Other specified extrapyramidal and movement disorders: Secondary | ICD-10-CM

## 2023-12-24 DIAGNOSIS — H5202 Hypermetropia, left eye: Secondary | ICD-10-CM | POA: Diagnosis not present

## 2023-12-24 DIAGNOSIS — H524 Presbyopia: Secondary | ICD-10-CM | POA: Diagnosis not present

## 2023-12-24 NOTE — Progress Notes (Signed)
Post Operative Evaluation    Procedure/Date of Surgery: Left hip arthroscopy with labral repair and cam debridement 11/4  Presents today for follow-up of the right shoulder as well as the left hip.  With regard to the left hip.  He has been experiencing some pain around the abductor tubercle of the groin area around the pubic symphysis.  The right shoulder is still having upper cervical type pain.  PMH/PSH/Family History/Social History/Meds/Allergies:    Past Medical History:  Diagnosis Date   Anxiety    Blood transfusion without reported diagnosis 2010   During Chemo Treatments   Hypothyroidism    Leukemia, acute myeloid, in remission (HCC) 2010   Thyroid disease    Past Surgical History:  Procedure Laterality Date   CYSTECTOMY Left    Scrotum   PORT-A-CATH REMOVAL     WISDOM TOOTH EXTRACTION     Social History   Socioeconomic History   Marital status: Married    Spouse name: Not on file   Number of children: 0   Years of education: college   Highest education level: Bachelor's degree (e.g., BA, AB, BS)  Occupational History   Occupation: Disabled  Tobacco Use   Smoking status: Never   Smokeless tobacco: Never  Vaping Use   Vaping status: Never Used  Substance and Sexual Activity   Alcohol use: Yes    Comment: "maybe one drink every two years"   Drug use: No   Sexual activity: Yes    Partners: Female  Other Topics Concern   Not on file  Social History Narrative   Lives at home with his wife.   Left-handed.   No daily caffeine use.    Social Drivers of Corporate investment banker Strain: Low Risk  (02/14/2023)   Overall Financial Resource Strain (CARDIA)    Difficulty of Paying Living Expenses: Not hard at all  Food Insecurity: No Food Insecurity (02/14/2023)   Hunger Vital Sign    Worried About Running Out of Food in the Last Year: Never true    Ran Out of Food in the Last Year: Never true  Transportation Needs: No  Transportation Needs (02/14/2023)   PRAPARE - Administrator, Civil Service (Medical): No    Lack of Transportation (Non-Medical): No  Physical Activity: Insufficiently Active (02/14/2023)   Exercise Vital Sign    Days of Exercise per Week: 3 days    Minutes of Exercise per Session: 30 min  Stress: No Stress Concern Present (02/14/2023)   [employer] of Occupational Health - Occupational Stress Questionnaire    Feeling of Stress : Only a little  Social Connections: Unknown (02/14/2023)   Social Connection and Isolation Panel [NHANES]    Frequency of Communication with Friends and Family: Once a week    Frequency of Social Gatherings with Friends and Family: Patient declined    Attends Religious Services: Never    Database administrator or Organizations: No    Attends Engineer, structural: Not on file    Marital Status: Married   Family History  Problem Relation Age of Onset   Hypertension Mother    Hypertension Father    Multiple sclerosis Maternal Grandmother    Dementia Maternal Grandmother    Diabetes Maternal Grandfather    Heart attack Maternal Grandfather 18  Diabetes Paternal Grandfather    Thyroid cancer Maternal Aunt    Prostate cancer Neg Hx    Colon cancer Neg Hx    Allergies  Allergen Reactions   Topamax [Topiramate] Other (See Comments)    Anger/irritable   Trazodone And Nefazodone Nausea Only   Current Outpatient Medications  Medication Sig Dispense Refill   acetaminophen (TYLENOL) 500 MG tablet Take 1,000 mg by mouth every 6 (six) hours as needed.     diazepam (VALIUM) 5 MG tablet Take 1 tablet (5 mg total) by mouth every 12 (twelve) hours as needed for muscle spasms 30 tablet 0   ibuprofen (ADVIL) 200 MG tablet Take 200-400 mg by mouth every 6 (six) hours as needed for moderate pain.     levothyroxine (SYNTHROID) 150 MCG tablet Take 1 tablet (150 mcg total) by mouth daily. 90 tablet 3   Multiple Vitamin (MULTIVITAMIN WITH MINERALS)  TABS tablet Take 1 tablet by mouth daily.     No current facility-administered medications for this visit.   No results found.  Review of Systems:   A ROS was performed including pertinent positives and negatives as documented in the HPI.   Musculoskeletal Exam:    There were no vitals taken for this visit.  Left hip with 30 degrees internal/external rotation of the hip without pain.  Active flexion of the left hip is to 90 degrees.  Mild Trendelenburg gait.  Walks with crutches.  Remainder of distal neurosensory exam is intact with 2+ dorsalis pedis pulse  Right shoulder with tenderness about the medial scapula without palpable clicking.  There is scapular winging as he goes into forward elevation.  Negative O'Brien.  Forward elevation is to 170 degrees with external rotation to 50 degrees  Imaging:      I personally reviewed and interpreted the radiographs.   Assessment:   Presents today for discussion of the right shoulder.  At this time his symptoms are somewhat consistent with scapular dyskinesis and scapular winging.  He has upper trapezial pain which may be reflective of a possible cervical degenerative disc disease.  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  Given the fact that he is not having any radiating or weakness type symptoms I do believe initial trial physical therapy for some dry needling of his upper trapezius muscles would benefit him significantly.  I does also counsel him on a cervical traction type brace.  I will plan to see him back in 2 months for reassessment of both these Plan :    -Return to clinic 2 months for reassessment of shoulder and hip    I personally saw and evaluated the patient, and participated in the management and treatment plan.  Huel Cote, MD Attending Physician, Orthopedic Surgery  This document was dictated using Dragon voice recognition software. A reasonable attempt at proof reading has been made to minimize  errors.

## 2023-12-29 ENCOUNTER — Encounter (HOSPITAL_BASED_OUTPATIENT_CLINIC_OR_DEPARTMENT_OTHER): Payer: Self-pay | Admitting: Physical Therapy

## 2023-12-29 ENCOUNTER — Ambulatory Visit (HOSPITAL_BASED_OUTPATIENT_CLINIC_OR_DEPARTMENT_OTHER): Payer: 59 | Attending: Orthopaedic Surgery | Admitting: Physical Therapy

## 2023-12-29 DIAGNOSIS — G8929 Other chronic pain: Secondary | ICD-10-CM | POA: Diagnosis not present

## 2023-12-29 DIAGNOSIS — M6281 Muscle weakness (generalized): Secondary | ICD-10-CM | POA: Insufficient documentation

## 2023-12-29 DIAGNOSIS — M25552 Pain in left hip: Secondary | ICD-10-CM | POA: Insufficient documentation

## 2023-12-29 DIAGNOSIS — M25511 Pain in right shoulder: Secondary | ICD-10-CM | POA: Diagnosis not present

## 2023-12-29 DIAGNOSIS — R262 Difficulty in walking, not elsewhere classified: Secondary | ICD-10-CM | POA: Diagnosis not present

## 2023-12-29 NOTE — Therapy (Signed)
OUTPATIENT PHYSICAL THERAPY SHOULDER TREATMENT   Patient Name: Zachary Little MRN: [id number] DOB:Oct 21, 1981, 43 y.o., male Today's Date: 12/29/2023  END OF SESSION:  PT End of Session - 12/29/23 0931     Visit Number 13    Number of Visits 26    Date for PT Re-Evaluation 03/07/24    Authorization Type [facility] [employer]    PT Start Time 0930    PT Stop Time 1014    PT Time Calculation (min) 44 min    Activity Tolerance Patient tolerated treatment well    Behavior During Therapy [facility] for tasks assessed/performed                       Past Medical History:  Diagnosis Date   Anxiety    Blood transfusion without reported diagnosis 2010   During Chemo Treatments   Hypothyroidism    Leukemia, acute myeloid, in remission (HCC) 2010   Thyroid disease    Past Surgical History:  Procedure Laterality Date   CYSTECTOMY Left    Scrotum   PORT-A-CATH REMOVAL     WISDOM TOOTH EXTRACTION     Patient Active Problem List   Diagnosis Date Noted   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   Chronic right hip pain 09/23/2023   Labral tear of left hip joint 10/01/2022   DDD (degenerative disc disease), cervical, 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 C6 foraminal stenosis with left-sided periscapular discomfort 04/23/2022   Pes anserinus bursitis of left knee 03/19/2022   Chronic pain syndrome 12/29/2019   Neuropathic pain of thigh, right 12/29/2019   Labral tear of shoulder, right, sequela 10/06/2019   Depression, major, single episode, 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   Erectile dysfunction 03/09/2018   Obesity 03/09/2018   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/02/2018   Knee pain 10/31/2017   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 Left hip labral repair Left hip CAM debridement  SUBJECTIVE:   SUBJECTIVE STATEMENT:  Pt states that he has L hip adductor discomfort. It feels achey and painful to palpation. Pt states that a new pillow seems to be helping with posterior shoulder pain.   Eval: Pt is here for the R should evaluation today. Pt notes that the R shoulder blade will hurt up to a 6/10 as it goes into adduction as he is sleeping. Unable to side sleep anymore. Pt states the the shoulder hurts with walking and it is creating pain from just sitting at his side. Pt did have an MRI that did show labral tear. Pt states that that shoulder pain has been ongoing off and on for about 5 years (beyond time of imaging) Jogging does not increase pain.  Lifting weights did not cause pain. Denies NT.  Denies history of neck involvement/pain. Pt states that the pain will also feel it into the triceps. Feels similar aching sensation. Pt is L hand dominant. Has not noticed weakness into the R UE. No dropping of items. Movement during ADL cannot  recreate pain.   Pt has had an MRI that shows C6 stenosis. He has been doing light HEP for the neck and shoulders: ROM  Eval:  Pt has been compliant with WB precautions. Pt has been resting and protecting the hip appropriately. The lateral thigh on the L is aching and painful on occasion. Pt has been using aspirin and icing as directed. Denies NT. Denies s/s of infection. Pt states he has not been icing as much recently. Was not provided a brace post-op.   PERTINENT HISTORY: N/A PAIN:   PAIN:  Are you having pain? No none at rest but does come on with sleep and walking VAS scale: 0/10 Pain location:  R rhomboid  area Pain description:aching  Aggravating factors: walking,cross body adduction Relieving factors: massage/theracane   Are you having pain? No: NPRS scale: 0/10 Pain location: L lateral Pain description: aching Aggravating factors: movement  Relieving factors: rest/ meds   PRECAUTIONS: Other: L hip labral repair  RED FLAGS: None   WEIGHT BEARING RESTRICTIONS: Yes TTWB for first two weeks  FALLS:  Has patient fallen in last 6 months? No  LIVING ENVIRONMENT: Lives with: lives with their family Lives in: House/apartment Stairs: 2nd story home Has following equipment at home: Crutches  OCCUPATION: N/A  Running- flat road with occasional trails/hills  PLOF: Independent  PATIENT GOALS: be able to run about 3 miles again; sleep on shoulder/reduce shoulder pain   OBJECTIVE:  Note: Objective measures were completed at Evaluation unless otherwise noted.  DIAGNOSTIC FINDINGS:   FINDINGS: Rotator cuff:  Intact without significant tendinosis.   Muscles:  No focal muscular atrophy or edema.   Biceps long head:  Intact and normally positioned.   Acromioclavicular Joint: Normal acromioclavicular joint. No significant fluid or contrast is present in the subacromial/subdeltoid bursa.   Glenohumeral Joint: Distended with intra-articular contrast. No chondral defect.   Labrum: Superior labral tear (series 5, image 10). Remaining labrum is intact.   Bones: No acute fracture or dislocation. No suspicious bone lesion.   Other: None.   IMPRESSION: 1. Superior labral tear.  IMPRESSION: 1. Broad-based posterior disc bulge with uncovertebral spurring at C5-6 with resultant moderate spinal stenosis, with mild left C6 foraminal narrowing. 2. Additional mild noncompressive disc bulging at C4-5 and C6-7 without stenosis or impingement.  MMT in lbs  Right 1/13  Left 1/13  Shoulder flexion 39.6 49.3  Shoulder extension    Shoulder abduction 34.5 37.0  Shoulder protaction  34.3 40.8  Shoulder adduction    Shoulder internal rotation 5/5 5/5  Shoulder external rotation 4+/5 4+/5  (Blank rows = not tested)   Shoulder AROM symmetrical and WFL bilat; no recreation of pain    CERVICAL ROM:   Active ROM A/PROM (deg) eval  Flexion 100%  Extension 60%  Right lateral flexion 75% R p!  Left lateral flexion 75%  Right rotation 80%  Left rotation 80%   (Blank rows = not tested)    C/S joint mobility: lack of R side glide and ext   T/S joint moiblity: significantly hypomobile with ext and rotation joint glides      TODAY'S TREATMENT:  DATE:   2/3 L hip mulligan mob grade III-IV inf and lateral  FABER slide 3s 15x Butterfly 30s 2x Lateral step down 3x8 8"  RTB serratus punch in standing 3x10 Diagonals with RTB 3x5 15lbs goblet squat 3x8  Half kneeling hip flexor 30s 3x   1/17 FOTO 80% STM to R upper traps Cupping to R UT  Bil ER in standing YTB 2x10 Serratus flexion with YTB x10 Supine thoracic ext over 1/2 roll x10 Supine horizontal abd RTB 2x10  Single leg bridge 3" 2x10 L LE Runner step up with heel raise 8" x20 Staggered sit to stands from elevated plinth 2x5 (very fatiguing) Standing hip abd RTB at ankles 2x10ea   1/13  T4-10 CPA grade IV C3-6 UPA on R grade III   Exercises - Thoracic Extension Mobilization on Foam Roll  - 1 x daily - 7 x weekly - 2 sets - 10 reps - 2s hold - Shoulder External Rotation and Scapular Retraction with Resistance  - 3-4 x daily - 7 x weekly - 1 sets - 10 reps   1/9  Recumbent bike Lvl 1 6 min  STM to proximal lateral quad TPR to R periscapular mm  Seated scap retraction 5" x20  Single leg bridge 5" 2x10 L LE Supine SLR 2x10 Sidelying hip abduction 3x10 Runner step up 8" x30L Static lunges at rail 2x10ea  Squats 2x10 with 3 seconds hold, seconds set (only 9 reps  with second set, pt fatigued and lost balance, falling to ground from squat position. Did not hurt self and able to stand up quickly and independently.  Able to stand independently following this.) SLS on airex with alternating lateral reaches x10ea LAQ 5# 5" 2x10    1/6  Recumbent bike Lvl 1 6 min  STM to proximal hip flexors  Supine clams/bridge- 5 clams with GTB followed by 5" bridge x10ea Single leg bridge 3" 2x10 L LE Supine SLR 2x10 Sidelying hip abduction 3x10 LAQ 5# 5" 2x15 Standing resisted hip flexion SLR  Runner step up 8" x30L Static lunges at rail x10ea  Squats x10 (tried 3 second hold but pt reports significant fatigue)  SLS on airex with alternating lateral reaches 2x5ea   12/30  Recumbent bike Lvl 1 6 min Bar assist deep squat 3s pause 3x8 Lateral lunge stretch 30s 3x Lateral lunge at rail 2x10 Sidestepping with GTB at ankles 35 ft 3x SLS with 10lb weight pass 3x20 8" step up 2x10 lateral    PATIENT EDUCATION:  Education details: anatomy, exercise progression, DOMS expectations, muscle firing,  envelope of function, HEP, POC Person educated: Patient Education method: Explanation, Demonstration, Tactile cues, Verbal cues, and Handouts Education comprehension: verbalized understanding, returned demonstration, verbal cues required, and tactile cues required   HOME EXERCISE PROGRAM:   Access Code: [email or web address] (prefers print) URL: [email or web address] Date: 10/06/2023 Prepared by: Zebedee Iba    Access Code: [phone] (shoulder; texted to pt)  URL: [email or web address] Date: 12/08/2023 Prepared by: Zebedee Iba    ASSESSMENT:   CLINICAL IMPRESSION: Pt able to tolerate progressive L hip mobility, strength, and control exercise today without increasing pain. Pt did improve hip extension and reports targeting of groin discomfort with new hip flexor stretching exrecise. Pt continues to fatigue quickly and lacks control with new  SL activity today. However, pt was able to demo good hip hinge position and progress DL squatting today. Plan to trial reverse lunging as well as continued scapular strengthening exercise at future sessions.   Re-evaluation today  for L shoulder to be combined with hip case. Pt's s/s appear consistent with thoracic/mid back type pain due to static positioning and positional, static over-lengthening of scapular retractors. Pain into R triceps appears to be referral pain vs cervicogenic. Cervical testing does not recreated distal pain. Pt is very stiff into T/S with significant lack of ext. Pt tends to sit with shoulder rounded and IR which may be contributing to discomfort on L. Plan to continue with thoracic mobility and general scapular retractor strengthening at future sessions.    OBJECTIVE IMPAIRMENTS: decreased activity tolerance, decreased balance, decreased endurance, decreased mobility, decreased ROM, decreased strength, hypomobility, increased muscle spasms, impaired flexibility, improper body mechanics, postural dysfunction, and pain.    ACTIVITY LIMITATIONS: lifting, squatting, locomotion level, and dressing   PARTICIPATION LIMITATIONS: interpersonal relationship, community activity, and exercise   PERSONAL FACTORS: Past/current experiences and Time since onset of injury/illness/exacerbation are also affecting patient's functional outcome.    REHAB POTENTIAL: Good   CLINICAL DECISION MAKING: Stable/uncomplicated   EVALUATION COMPLEXITY: Low     GOALS:     SHORT TERM GOALS: Target date: 11/17/2023       Pt will become independent with HEP in order to demonstrate synthesis of PT education.   Goal status: met   2. Pt will be able to demonstrate full hip AROM in order to demonstrate functional improvement in LE function for self-care and house hold duties.      Goal status: ongoing   3.  Pt will report at least 2 pt reduction on NPRS scale for pain in order to demonstrate  functional improvement with household activity, self care, and ADL.    Goal status: met  4. Pt will report at least 2 pt reduction on NPRS scale for pain in order to demonstrate functional improvement with household activity, self care, and ADL.   Goal status: INITIAL   LONG TERM GOALS: Target date: 12/29/2023         Pt  will become independent with final HEP in order to demonstrate synthesis of PT education.   Goal status: INITIAL   2.  Pt will score >/= 71 on FOTO to demonstrate improvement in perceived L hip function.      Goal status: INITIAL   3.  Pt will be able to demonstrate 20x 8" box step downs without form deviation or pain in order to demonstrate functional improvement in LE function for return to hopping/jumping.    Goal status: INITIAL   4.  Pt will be able to demonstrate 80% strength with HHD in order to demonstrate functional improvement and tolerance to return to running.    Goal status: INITIAL   5. Pt will be able to demonstrate ability to DL hop without pain in order to demonstrate functional improvement and tolerance to low level plyometric loading.     Goal status: INITIAL  6. Pt will be able to demonstrate/report ability to sit/stand/sleep for extended periods of time without pain in order to demonstrate functional improvement and tolerance to static positioning.   Goal status: INITIAL       PLAN:   PT FREQUENCY: 1-2x/week   PT DURATION: 12 weeks (combine with hip labral repair episode)   PLANNED INTERVENTIONS: Therapeutic exercises, Therapeutic activity, Neuromuscular re-education, Balance training, Gait training, Patient/Family education, Self Care, Joint mobilization, Joint manipulation, Stair training, Aquatic Therapy, Dry Needling, Electrical stimulation, Spinal manipulation, Spinal mobilization, Cryotherapy, Moist heat, scar mobilization, Splintting, Taping, Vasopneumatic device, Traction, Ultrasound, Ionotophoresis 4mg /ml Dexamethasone,  Manual therapy,  and Re-evaluation   PLAN FOR NEXT SESSION: hip arthroscopy protocol; ROM, muscle activation; STM and joint mobs PRN    Zebedee Iba, PT 12/29/2023, 10:19 AM

## 2024-01-01 ENCOUNTER — Ambulatory Visit (HOSPITAL_BASED_OUTPATIENT_CLINIC_OR_DEPARTMENT_OTHER): Payer: 59

## 2024-01-01 ENCOUNTER — Encounter (HOSPITAL_BASED_OUTPATIENT_CLINIC_OR_DEPARTMENT_OTHER): Payer: Self-pay

## 2024-01-01 DIAGNOSIS — M25552 Pain in left hip: Secondary | ICD-10-CM | POA: Diagnosis not present

## 2024-01-01 DIAGNOSIS — M25511 Pain in right shoulder: Secondary | ICD-10-CM | POA: Diagnosis not present

## 2024-01-01 DIAGNOSIS — M6281 Muscle weakness (generalized): Secondary | ICD-10-CM

## 2024-01-01 DIAGNOSIS — G8929 Other chronic pain: Secondary | ICD-10-CM | POA: Diagnosis not present

## 2024-01-01 DIAGNOSIS — R262 Difficulty in walking, not elsewhere classified: Secondary | ICD-10-CM | POA: Diagnosis not present

## 2024-01-01 NOTE — Therapy (Signed)
 OUTPATIENT PHYSICAL THERAPY SHOULDER TREATMENT   Patient Name: Zachary Little MRN: [id number] DOB:1981/01/08, 43 y.o., male Today's Date: 01/01/2024  END OF SESSION:  PT End of Session - 01/01/24 1045     Visit Number 14    Number of Visits 26    Date for PT Re-Evaluation 03/07/24    Authorization Type [facility] [employer]    PT Start Time [phone]    PT Stop Time 1015    PT Time Calculation (min) 42 min    Activity Tolerance Patient tolerated treatment well    Behavior During Therapy 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   Chronic right hip pain 09/23/2023   Labral tear of left hip joint 10/01/2022   DDD (degenerative disc disease), cervical, 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 C6 foraminal stenosis with left-sided periscapular discomfort 04/23/2022   Pes anserinus bursitis of left knee 03/19/2022   Chronic pain syndrome 12/29/2019   Neuropathic pain of thigh, right 12/29/2019   Labral tear of shoulder, right, sequela 10/06/2019   Depression, major, single episode, 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   Erectile dysfunction 03/09/2018   Obesity 03/09/2018   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/02/2018   Knee pain 10/31/2017   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 Left hip labral repair Left hip CAM debridement  SUBJECTIVE:   SUBJECTIVE STATEMENT:  Pt reports soreness in R hip after last session. Feels improved shoulder pain when he sleeps on his back. Sleeping on either side tends to bother it.   Eval: Pt is here for the R should evaluation today. Pt notes that the R shoulder blade will hurt up to a 6/10 as it goes into adduction as he is sleeping. Unable to side sleep anymore. Pt states the the shoulder hurts with walking and it is creating pain from just sitting at his side. Pt did have an MRI that did show labral tear. Pt states that that shoulder pain has been ongoing off and on for about 5 years (beyond time of imaging) Jogging does not increase pain.  Lifting weights did not cause pain. Denies NT.  Denies history of neck involvement/pain. Pt states that the pain will also feel it into the triceps. Feels similar aching sensation. Pt is L hand dominant. Has not noticed weakness into the R UE. No dropping of items. Movement during ADL cannot recreate pain.  Pt has had an MRI that shows C6 stenosis. He has been doing light HEP for the neck and shoulders: ROM  Eval:  Pt has been compliant with WB precautions. Pt has been resting and protecting the hip appropriately. The lateral thigh on the L is aching and painful on occasion. Pt has been using aspirin and icing as directed. Denies NT. Denies s/s of infection. Pt states he has not been icing as much recently. Was not provided a brace post-op.   PERTINENT HISTORY: N/A PAIN:   PAIN:  Are you having pain? No none at rest but does come on with sleep and walking VAS scale: 0/10 Pain location:  R rhomboid area Pain  description:aching  Aggravating factors: walking,cross body adduction Relieving factors: massage/theracane   Are you having pain? No: NPRS scale: 0/10 Pain location: L lateral Pain description: aching Aggravating factors: movement  Relieving factors: rest/ meds   PRECAUTIONS: Other: L hip labral repair  RED FLAGS: None   WEIGHT BEARING RESTRICTIONS: Yes TTWB for first two weeks  FALLS:  Has patient fallen in last 6 months? No  LIVING ENVIRONMENT: Lives with: lives with their family Lives in: House/apartment Stairs: 2nd story home Has following equipment at home: Crutches  OCCUPATION: N/A  Running- flat road with occasional trails/hills  PLOF: Independent  PATIENT GOALS: be able to run about 3 miles again; sleep on shoulder/reduce shoulder pain   OBJECTIVE:  Note: Objective measures were completed at Evaluation unless otherwise noted.  DIAGNOSTIC FINDINGS:   FINDINGS: Rotator cuff:  Intact without significant tendinosis.   Muscles:  No focal muscular atrophy or edema.   Biceps long head:  Intact and normally positioned.   Acromioclavicular Joint: Normal acromioclavicular joint. No significant fluid or contrast is present in the subacromial/subdeltoid bursa.   Glenohumeral Joint: Distended with intra-articular contrast. No chondral defect.   Labrum: Superior labral tear (series 5, image 10). Remaining labrum is intact.   Bones: No acute fracture or dislocation. No suspicious bone lesion.   Other: None.   IMPRESSION: 1. Superior labral tear.  IMPRESSION: 1. Broad-based posterior disc bulge with uncovertebral spurring at C5-6 with resultant moderate spinal stenosis, with mild left C6 foraminal narrowing. 2. Additional mild noncompressive disc bulging at C4-5 and C6-7 without stenosis or impingement.  MMT in lbs  Right 1/13  Left 1/13  Shoulder flexion 39.6 49.3  Shoulder extension    Shoulder abduction 34.5 37.0  Shoulder protaction 34.3 40.8   Shoulder adduction    Shoulder internal rotation 5/5 5/5  Shoulder external rotation 4+/5 4+/5  (Blank rows = not tested)   Shoulder AROM symmetrical and WFL bilat; no recreation of pain    CERVICAL ROM:   Active ROM A/PROM (deg) eval  Flexion 100%  Extension 60%  Right lateral flexion 75% R p!  Left lateral flexion 75%  Right rotation 80%  Left rotation 80%   (Blank rows = not tested)    C/S joint mobility: lack of R side glide and ext   T/S joint moiblity: significantly hypomobile with ext and rotation joint glides      TODAY'S TREATMENT:  DATE:   2/6 L hip PROM   Lateral step down 3x8 8  RTB serratus punch in standing 3x10 Diagonals with RTB PNF D2 2x10 Horizontal abduction RTB 2x15 Bilateral ER RTB x15 (stopped due to lateral forearm discomfort) Staggered STS 2x10 L posterior Reverse lunges at rail x 10 alternating Half kneeling hip flexor 30s 3x Serratus raises RTB 2x10 Drivers RTB [id number] Wall clocks x10 RTB Side stepping with squats x 3 laps (1/2 hall) Blue TB at ankles 2x10    2/3 L hip mulligan mob grade III-IV inf and lateral  FABER slide 3s 15x Butterfly 30s 2x Lateral step down 3x8 8  RTB serratus punch in standing 3x10 Diagonals with RTB 3x5 15lbs goblet squat 3x8  Half kneeling hip flexor 30s 3x   1/17 FOTO 80% STM to R upper traps Cupping to R UT  Bil ER in standing YTB 2x10 Serratus flexion with YTB x10 Supine thoracic ext over 1/2 roll x10 Supine horizontal abd RTB 2x10  Single leg bridge 3 2x10 L LE Runner step up with heel raise 8 x20 Staggered sit to stands from elevated plinth 2x5 (very fatiguing) Standing hip abd RTB at ankles 2x10ea   1/13  T4-10 CPA grade IV C3-6 UPA on R grade III   Exercises - Thoracic Extension Mobilization on Foam Roll  - 1 x daily - 7 x weekly - 2 sets - 10 reps -  2s hold - Shoulder External Rotation and Scapular Retraction with Resistance  - 3-4 x daily - 7 x weekly - 1 sets - 10 reps   1/9  Recumbent bike Lvl 1 6 min  STM to proximal lateral quad TPR to R periscapular mm  Seated scap retraction 5 x20  Single leg bridge 5 2x10 L LE Supine SLR 2x10 Sidelying hip abduction 3x10 Runner step up 8 x30L Static lunges at rail 2x10ea  Squats 2x10 with 3 seconds hold, seconds set (only 9 reps with second set, pt fatigued and lost balance, falling to ground from squat position. Did not hurt self and able to stand up quickly and independently.  Able to stand independently following this.) SLS on airex with alternating lateral reaches x10ea LAQ 5# 5 2x10    1/6  Recumbent bike Lvl 1 6 min  STM to proximal hip flexors  Supine clams/bridge- 5 clams with GTB followed by 5 bridge x10ea Single leg bridge 3 2x10 L LE Supine SLR 2x10 Sidelying hip abduction 3x10 LAQ 5# 5 2x15 Standing resisted hip flexion SLR  Runner step up 8 x30L Static lunges at rail x10ea  Squats x10 (tried 3 second hold but pt reports significant fatigue)  SLS on airex with alternating lateral reaches 2x5ea   12/30  Recumbent bike Lvl 1 6 min Bar assist deep squat 3s pause 3x8 Lateral lunge stretch 30s 3x Lateral lunge at rail 2x10 Sidestepping with GTB at ankles 35 ft 3x SLS with 10lb weight pass 3x20 8 step up 2x10 lateral    PATIENT EDUCATION:  Education details: anatomy, exercise progression, DOMS expectations, muscle firing,  envelope of function, HEP, POC Person educated: Patient Education method: Explanation, Demonstration, Tactile cues, Verbal cues, and Handouts Education comprehension: verbalized understanding, returned demonstration, verbal cues required, and tactile cues required   HOME EXERCISE PROGRAM:   Access Code: [email or web address] (prefers print) URL: [email or web address] Date: 10/06/2023 Prepared by: Dale Call    Access  Code: [phone] (shoulder; texted to pt)  URL: [email or web address] Date: 12/08/2023 Prepared by: Dale Call    ASSESSMENT:  CLINICAL IMPRESSION: Pt without significant restrictions today in L hip with PROM. Continue to progress L hip and R shoulder strengthening. Challenged by staggered sit to stands, though this has improved greatly from previous sessions. Pt also demonstrates improved strength with resisted scapular exercises. Will continue to work on licensed conveyancer with both UE and LE movements.   Re-evaluation today for L shoulder to be combined with hip case. Pt's s/s appear consistent with thoracic/mid back type pain due to static positioning and positional, static over-lengthening of scapular retractors. Pain into R triceps appears to be referral pain vs cervicogenic. Cervical testing does not recreated distal pain. Pt is very stiff into T/S with significant lack of ext. Pt tends to sit with shoulder rounded and IR which may be contributing to discomfort on L. Plan to continue with thoracic mobility and general scapular retractor strengthening at future sessions.    OBJECTIVE IMPAIRMENTS: decreased activity tolerance, decreased balance, decreased endurance, decreased mobility, decreased ROM, decreased strength, hypomobility, increased muscle spasms, impaired flexibility, improper body mechanics, postural dysfunction, and pain.    ACTIVITY LIMITATIONS: lifting, squatting, locomotion level, and dressing   PARTICIPATION LIMITATIONS: interpersonal relationship, community activity, and exercise   PERSONAL FACTORS: Past/current experiences and Time since onset of injury/illness/exacerbation are also affecting patient's functional outcome.    REHAB POTENTIAL: Good   CLINICAL DECISION MAKING: Stable/uncomplicated   EVALUATION COMPLEXITY: Low     GOALS:     SHORT TERM GOALS: Target date: 11/17/2023       Pt will become independent with HEP in  order to demonstrate synthesis of PT education.   Goal status: met   2. Pt will be able to demonstrate full hip AROM in order to demonstrate functional improvement in LE function for self-care and house hold duties.      Goal status: ongoing   3.  Pt will report at least 2 pt reduction on NPRS scale for pain in order to demonstrate functional improvement with household activity, self care, and ADL.    Goal status: met  4. Pt will report at least 2 pt reduction on NPRS scale for pain in order to demonstrate functional improvement with household activity, self care, and ADL.   Goal status: INITIAL   LONG TERM GOALS: Target date: 12/29/2023         Pt  will become independent with final HEP in order to demonstrate synthesis of PT education.   Goal status: INITIAL   2.  Pt will score >/= 71 on FOTO to demonstrate improvement in perceived L hip function.      Goal status: INITIAL   3.  Pt will be able to demonstrate 20x 8 box step downs without form deviation or pain in order to demonstrate functional improvement in LE function for return to hopping/jumping.    Goal status: INITIAL   4.  Pt will be able to demonstrate 80% strength with HHD in order to demonstrate functional improvement and tolerance to return to running.    Goal status: INITIAL   5. Pt will be able to demonstrate ability to DL hop without pain in order to demonstrate functional improvement and tolerance to low level plyometric loading.     Goal status: INITIAL  6. Pt will be able to demonstrate/report ability to sit/stand/sleep for extended periods of time without pain in order to demonstrate functional improvement and tolerance to static positioning.   Goal status: INITIAL       PLAN:   PT FREQUENCY:  1-2x/week   PT DURATION: 12 weeks (combine with hip labral repair episode)   PLANNED INTERVENTIONS: Therapeutic exercises, Therapeutic activity, Neuromuscular re-education, Balance training, Gait  training, Patient/Family education, Self Care, Joint mobilization, Joint manipulation, Stair training, Aquatic Therapy, Dry Needling, Electrical stimulation, Spinal manipulation, Spinal mobilization, Cryotherapy, Moist heat, scar mobilization, Splintting, Taping, Vasopneumatic device, Traction, Ultrasound, Ionotophoresis 4mg /ml Dexamethasone , Manual therapy, and Re-evaluation   PLAN FOR NEXT SESSION: hip arthroscopy protocol; ROM, muscle activation; STM and joint mobs PRN    Asberry FORBES Rodes, PTA 01/01/2024, 11:38 AM [facility] at [facility] [address] [place], [place], [address] Phone: [phone]   Fax:  [phone]  Patient Details  Name: Zachary Little MRN: [id number] Date of Birth: 05-03-1981 Referring Provider:  Genelle Standing, MD  Encounter Date: 01/01/2024   Asberry FORBES Rodes, PTA 01/01/2024, 11:38 AM  [facility] at [facility] [address] [place], [place], [address] Phone: [phone]   Fax:  [phone]

## 2024-01-05 ENCOUNTER — Encounter (HOSPITAL_BASED_OUTPATIENT_CLINIC_OR_DEPARTMENT_OTHER): Payer: Self-pay | Admitting: Physical Therapy

## 2024-01-05 ENCOUNTER — Encounter (HOSPITAL_BASED_OUTPATIENT_CLINIC_OR_DEPARTMENT_OTHER): Payer: Self-pay

## 2024-01-05 ENCOUNTER — Ambulatory Visit (HOSPITAL_BASED_OUTPATIENT_CLINIC_OR_DEPARTMENT_OTHER): Payer: 59 | Admitting: Physical Therapy

## 2024-01-08 ENCOUNTER — Ambulatory Visit (HOSPITAL_BASED_OUTPATIENT_CLINIC_OR_DEPARTMENT_OTHER): Payer: 59

## 2024-01-08 ENCOUNTER — Encounter (HOSPITAL_BASED_OUTPATIENT_CLINIC_OR_DEPARTMENT_OTHER): Payer: Self-pay

## 2024-01-08 DIAGNOSIS — G8929 Other chronic pain: Secondary | ICD-10-CM

## 2024-01-08 DIAGNOSIS — M25552 Pain in left hip: Secondary | ICD-10-CM | POA: Diagnosis not present

## 2024-01-08 DIAGNOSIS — R262 Difficulty in walking, not elsewhere classified: Secondary | ICD-10-CM

## 2024-01-08 DIAGNOSIS — M6281 Muscle weakness (generalized): Secondary | ICD-10-CM

## 2024-01-08 DIAGNOSIS — M25511 Pain in right shoulder: Secondary | ICD-10-CM | POA: Diagnosis not present

## 2024-01-08 NOTE — Therapy (Signed)
OUTPATIENT PHYSICAL THERAPY SHOULDER TREATMENT      Patient Name: Zachary Little MRN: [id number] DOB:[birth year], 43 y.o., male Today's Date: 01/08/2024  END OF SESSION:  PT End of Session - 01/08/24 0919     Visit Number 15    Number of Visits 26    Date for PT Re-Evaluation 03/07/24    Authorization Type [facility] [employer]    PT Start Time 0932    PT Stop Time 1015    PT Time Calculation (min) 43 min    Activity Tolerance Patient tolerated treatment well    Behavior During Therapy [facility] for tasks assessed/performed                         Past Medical History:  Diagnosis Date   Anxiety    Blood transfusion without reported diagnosis 2010   During Chemo Treatments   Hypothyroidism    Leukemia, acute myeloid, in remission (HCC) 2010   Thyroid disease    Past Surgical History:  Procedure Laterality Date   CYSTECTOMY Left    Scrotum   PORT-A-CATH REMOVAL     WISDOM TOOTH EXTRACTION     Patient Active Problem List   Diagnosis Date Noted   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   Chronic right hip pain 09/23/2023   Labral tear of left hip joint 10/01/2022   DDD (degenerative disc disease), cervical, 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 C6 foraminal stenosis with left-sided periscapular discomfort 04/23/2022   Pes anserinus bursitis of left knee 03/19/2022   Chronic pain syndrome 12/29/2019   Neuropathic pain of thigh, right 12/29/2019   Labral tear of shoulder, right, sequela 10/06/2019   Depression, major, single episode, 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   Erectile dysfunction 03/09/2018   Obesity 03/09/2018   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/02/2018   Knee pain 10/31/2017   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 Left hip labral repair Left hip CAM debridement  SUBJECTIVE:   SUBJECTIVE STATEMENT:  Pt reports increased neck pain after looking down playing board games for extended period this week. This feels better today. On Tuesday he noticed increased quad tightness and discomfort when resting. R shoulder area is improving.   Eval: Pt is here for the R should evaluation today. Pt notes that the R shoulder blade will hurt up to a 6/10 as it goes into adduction as he is sleeping. Unable to side sleep anymore. Pt states the the shoulder hurts with walking and it is creating pain from just sitting at his side. Pt did have an MRI that did show labral tear. Pt states that that shoulder pain has been ongoing off and on for about 5 years (beyond time of imaging) Jogging does not increase pain.  Lifting weights did not cause pain. Denies NT.  Denies history of neck involvement/pain. Pt states that the pain will also feel it into the triceps. Feels similar aching sensation. Pt is L hand dominant. Has not noticed weakness into  the R UE. No dropping of items. Movement during ADL cannot recreate pain.   Pt has had an MRI that shows C6 stenosis. He has been doing light HEP for the neck and shoulders: ROM  Eval:  Pt has been compliant with WB precautions. Pt has been resting and protecting the hip appropriately. The lateral thigh on the L is aching and painful on occasion. Pt has been using aspirin and icing as directed. Denies NT. Denies s/s of infection. Pt states he has not been icing as much recently. Was not provided a brace post-op.   PERTINENT HISTORY: N/A PAIN:   PAIN:  Are you having pain? No none at rest but  does come on with sleep and walking VAS scale: 0/10 Pain location:  R rhomboid area Pain description:aching  Aggravating factors: walking,cross body adduction Relieving factors: massage/theracane   Are you having pain? No: NPRS scale: 0/10 Pain location: L lateral Pain description: aching Aggravating factors: movement  Relieving factors: rest/ meds   PRECAUTIONS: Other: L hip labral repair  RED FLAGS: None   WEIGHT BEARING RESTRICTIONS: Yes TTWB for first two weeks  FALLS:  Has patient fallen in last 6 months? No  LIVING ENVIRONMENT: Lives with: lives with their family Lives in: House/apartment Stairs: 2nd story home Has following equipment at home: Crutches  OCCUPATION: N/A  Running- flat road with occasional trails/hills  PLOF: Independent  PATIENT GOALS: be able to run about 3 miles again; sleep on shoulder/reduce shoulder pain   OBJECTIVE:  Note: Objective measures were completed at Evaluation unless otherwise noted.  DIAGNOSTIC FINDINGS:   FINDINGS: Rotator cuff:  Intact without significant tendinosis.   Muscles:  No focal muscular atrophy or edema.   Biceps long head:  Intact and normally positioned.   Acromioclavicular Joint: Normal acromioclavicular joint. No significant fluid or contrast is present in the subacromial/subdeltoid bursa.   Glenohumeral Joint: Distended with intra-articular contrast. No chondral defect.   Labrum: Superior labral tear (series 5, image 10). Remaining labrum is intact.   Bones: No acute fracture or dislocation. No suspicious bone lesion.   Other: None.   IMPRESSION: 1. Superior labral tear.  IMPRESSION: 1. Broad-based posterior disc bulge with uncovertebral spurring at C5-6 with resultant moderate spinal stenosis, with mild left C6 foraminal narrowing. 2. Additional mild noncompressive disc bulging at C4-5 and C6-7 without stenosis or impingement.  MMT in lbs  Right 1/13  Left 1/13  Shoulder flexion 39.6  49.3  Shoulder extension    Shoulder abduction 34.5 37.0  Shoulder protaction 34.3 40.8  Shoulder adduction    Shoulder internal rotation 5/5 5/5  Shoulder external rotation 4+/5 4+/5  (Blank rows = not tested)   Shoulder AROM symmetrical and WFL bilat; no recreation of pain    CERVICAL ROM:   Active ROM A/PROM (deg) eval  Flexion 100%  Extension 60%  Right lateral flexion 75% R p!  Left lateral flexion 75%  Right rotation 80%  Left rotation 80%   (Blank rows = not tested)    C/S joint mobility: lack of R side glide and ext   T/S joint moiblity: significantly hypomobile with ext and rotation joint glides      TODAY'S TREATMENT:  DATE:    2/13 L hip PROM  IASTM- roller to L quads  Recumbent bike L 3 x11min Supine SLR 2x10ea Supine chin tuck 5" x10 Prone quad stretch with 1/2 roll under thigh Sidelying hip abduction 2# 3x10L Horizontal abduction RTB 2x15  Staggered eccentric STS 3x10 L posterior Reverse lunges at rail x 10 alternating Serratus raises RTB 2x10 Drivers RTB 6E95 Side stepping with squat x 3 laps (1/2 hall) Blue TB at ankles 2x10   2/6 L hip PROM   Lateral step down 3x8 8"  RTB serratus punch in standing 3x10 Diagonals with RTB PNF D2 2x10 Horizontal abduction RTB 2x15 Bilateral ER RTB x15 (stopped due to lateral forearm discomfort) Staggered STS 2x10 L posterior Reverse lunges at rail x 10 alternating Half kneeling hip flexor 30s 3x Serratus raises RTB 2x10 Drivers RTB 2W41 Wall clocks x10 RTB Side stepping with squats x 3 laps (1/2 hall) Blue TB at ankles 2x10    2/3 L hip mulligan mob grade III-IV inf and lateral  FABER slide 3s 15x Butterfly 30s 2x Lateral step down 3x8 8"  RTB serratus punch in standing 3x10 Diagonals with RTB 3x5 15lbs goblet squat 3x8  Half kneeling hip flexor 30s  3x   1/17 FOTO 80% STM to R upper traps Cupping to R UT  Bil ER in standing YTB 2x10 Serratus flexion with YTB x10 Supine thoracic ext over 1/2 roll x10 Supine horizontal abd RTB 2x10  Single leg bridge 3" 2x10 L LE Runner step up with heel raise 8" x20 Staggered sit to stands from elevated plinth 2x5 (very fatiguing) Standing hip abd RTB at ankles 2x10ea   1/13  T4-10 CPA grade IV C3-6 UPA on R grade III   Exercises - Thoracic Extension Mobilization on Foam Roll  - 1 x daily - 7 x weekly - 2 sets - 10 reps - 2s hold - Shoulder External Rotation and Scapular Retraction with Resistance  - 3-4 x daily - 7 x weekly - 1 sets - 10 reps   1/9  Recumbent bike Lvl 1 6 min  STM to proximal lateral quad TPR to R periscapular mm  Seated scap retraction 5" x20  Single leg bridge 5" 2x10 L LE Supine SLR 2x10 Sidelying hip abduction 3x10 Runner step up 8" x30L Static lunges at rail 2x10ea  Squats 2x10 with 3 seconds hold, seconds set (only 9 reps with second set, pt fatigued and lost balance, falling to ground from squat position. Did not hurt self and able to stand up quickly and independently.  Able to stand independently following this.) SLS on airex with alternating lateral reaches x10ea LAQ 5# 5" 2x10   PATIENT EDUCATION:  Education details: anatomy, exercise progression, DOMS expectations, muscle firing,  envelope of function, HEP, POC Person educated: Patient Education method: Explanation, Demonstration, Tactile cues, Verbal cues, and Handouts Education comprehension: verbalized understanding, returned demonstration, verbal cues required, and tactile cues required   HOME EXERCISE PROGRAM:   Access Code: [email or web address] (prefers print) URL: [email or web address] Date: 10/06/2023 Prepared by: Zebedee Iba    Access Code: [phone] (shoulder; texted to pt)  URL: [email or web address] Date: 12/08/2023 Prepared by: Zebedee Iba    ASSESSMENT:    CLINICAL IMPRESSION: Mild tightness into hip IR noted today. Spent time on IASTM using roller to quads which pt reported benefit from. Pt has strength deficit in L SLR compared to R. Good tolerance for posture re-ed and strengthening. Added chin tucks for pt to add to HEP. Pt working  towards strength goals.    Re-evaluation today for L shoulder to be combined with hip case. Pt's s/s appear consistent with thoracic/mid back type pain due to static positioning and positional, static over-lengthening of scapular retractors. Pain into R triceps appears to be referral pain vs cervicogenic. Cervical testing does not recreated distal pain. Pt is very stiff into T/S with significant lack of ext. Pt tends to sit with shoulder rounded and IR which may be contributing to discomfort on L. Plan to continue with thoracic mobility and general scapular retractor strengthening at future sessions.    OBJECTIVE IMPAIRMENTS: decreased activity tolerance, decreased balance, decreased endurance, decreased mobility, decreased ROM, decreased strength, hypomobility, increased muscle spasms, impaired flexibility, improper body mechanics, postural dysfunction, and pain.    ACTIVITY LIMITATIONS: lifting, squatting, locomotion level, and dressing   PARTICIPATION LIMITATIONS: interpersonal relationship, community activity, and exercise   PERSONAL FACTORS: Past/current experiences and Time since onset of injury/illness/exacerbation are also affecting patient's functional outcome.    REHAB POTENTIAL: Good   CLINICAL DECISION MAKING: Stable/uncomplicated   EVALUATION COMPLEXITY: Low     GOALS:     SHORT TERM GOALS: Target date: 11/17/2023       Pt will become independent with HEP in order to demonstrate synthesis of PT education.   Goal status: met   2. Pt will be able to demonstrate full hip AROM in order to demonstrate functional improvement in LE function for self-care and house hold duties.      Goal status:  ongoing   3.  Pt will report at least 2 pt reduction on NPRS scale for pain in order to demonstrate functional improvement with household activity, self care, and ADL.    Goal status: met  4. Pt will report at least 2 pt reduction on NPRS scale for pain in order to demonstrate functional improvement with household activity, self care, and ADL.   Goal status: INITIAL   LONG TERM GOALS: Target date: 12/29/2023         Pt  will become independent with final HEP in order to demonstrate synthesis of PT education.   Goal status: INITIAL   2.  Pt will score >/= 71 on FOTO to demonstrate improvement in perceived L hip function.      Goal status: INITIAL   3.  Pt will be able to demonstrate 20x 8" box step downs without form deviation or pain in order to demonstrate functional improvement in LE function for return to hopping/jumping.    Goal status: INITIAL   4.  Pt will be able to demonstrate 80% strength with HHD in order to demonstrate functional improvement and tolerance to return to running.    Goal status: INITIAL   5. Pt will be able to demonstrate ability to DL hop without pain in order to demonstrate functional improvement and tolerance to low level plyometric loading.     Goal status: INITIAL  6. Pt will be able to demonstrate/report ability to sit/stand/sleep for extended periods of time without pain in order to demonstrate functional improvement and tolerance to static positioning.   Goal status: INITIAL       PLAN:   PT FREQUENCY: 1-2x/week   PT DURATION: 12 weeks (combine with hip labral repair episode)   PLANNED INTERVENTIONS: Therapeutic exercises, Therapeutic activity, Neuromuscular re-education, Balance training, Gait training, Patient/Family education, Self Care, Joint mobilization, Joint manipulation, Stair training, Aquatic Therapy, Dry Needling, Electrical stimulation, Spinal manipulation, Spinal mobilization, Cryotherapy, Moist heat, scar mobilization,  Splintting, Taping, Vasopneumatic  device, Traction, Ultrasound, Ionotophoresis 4mg /ml Dexamethasone, Manual therapy, and Re-evaluation   PLAN FOR NEXT SESSION: hip arthroscopy protocol; ROM, muscle activation; STM and joint mobs PRN    Lenore Manner, PTA 01/08/2024, 11:03 AM [facility] [address] [place], [place], [address] Phone: [phone]   Fax:  [phone]  Patient Details  Name: Zachary Little MRN: [id number] Date of Birth: 12-04-1980 Referring Provider:  Huel Cote, MD  Encounter Date: 01/08/2024   Lenore Manner, PTA 01/08/2024, 11:03 AM  [facility] at [facility] [address] [place], [place], [address] Phone: [phone]   Fax:  [phone]

## 2024-01-26 ENCOUNTER — Ambulatory Visit (HOSPITAL_BASED_OUTPATIENT_CLINIC_OR_DEPARTMENT_OTHER): Payer: 59 | Attending: Orthopaedic Surgery

## 2024-01-26 ENCOUNTER — Encounter (HOSPITAL_BASED_OUTPATIENT_CLINIC_OR_DEPARTMENT_OTHER): Payer: Self-pay

## 2024-01-26 DIAGNOSIS — G8929 Other chronic pain: Secondary | ICD-10-CM | POA: Diagnosis not present

## 2024-01-26 DIAGNOSIS — M6281 Muscle weakness (generalized): Secondary | ICD-10-CM | POA: Insufficient documentation

## 2024-01-26 DIAGNOSIS — R262 Difficulty in walking, not elsewhere classified: Secondary | ICD-10-CM | POA: Diagnosis not present

## 2024-01-26 DIAGNOSIS — M25511 Pain in right shoulder: Secondary | ICD-10-CM | POA: Insufficient documentation

## 2024-01-26 DIAGNOSIS — M25552 Pain in left hip: Secondary | ICD-10-CM | POA: Insufficient documentation

## 2024-01-26 NOTE — Therapy (Signed)
 OUTPATIENT PHYSICAL THERAPY SHOULDER TREATMENT      Patient Name: Zachary Little MRN: [id number] DOB:[birth year], 43 y.o., male Today's Date: 01/26/2024  END OF SESSION:  PT End of Session - 01/26/24 0935     Visit Number 16    Number of Visits 26    Date for PT Re-Evaluation 03/07/24    Authorization Type [facility] [employer]    PT Start Time 0932    PT Stop Time 1014    PT Time Calculation (min) 42 min    Activity Tolerance Patient tolerated treatment well    Behavior During Therapy [facility] for tasks assessed/performed                          Past Medical History:  Diagnosis Date   Anxiety    Blood transfusion without reported diagnosis 2010   During Chemo Treatments   Hypothyroidism    Leukemia, acute myeloid, in remission (HCC) 2010   Thyroid disease    Past Surgical History:  Procedure Laterality Date   CYSTECTOMY Left    Scrotum   PORT-A-CATH REMOVAL     WISDOM TOOTH EXTRACTION     Patient Active Problem List   Diagnosis Date Noted   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   Chronic right hip pain 09/23/2023   Labral tear of left hip joint 10/01/2022   DDD (degenerative disc disease), cervical, 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 C6 foraminal stenosis with left-sided periscapular discomfort 04/23/2022   Pes anserinus bursitis of left knee 03/19/2022   Chronic pain syndrome 12/29/2019   Neuropathic pain of thigh, right 12/29/2019   Labral tear of shoulder, right, sequela 10/06/2019   Depression, major, single episode, 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   Erectile dysfunction 03/09/2018   Obesity 03/09/2018   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/02/2018   Knee pain 10/31/2017   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 Left hip labral repair Left hip CAM debridement  SUBJECTIVE:   SUBJECTIVE STATEMENT:  Pt reports increase in posterior R shoulder tightness which has been feeling better. Has been walking longer distances without much difficulty. Quads sometimes bother him with squats initially, but loosens after first couple sets.   Eval: Pt is here for the R should evaluation today. Pt notes that the R shoulder blade will hurt up to a 6/10 as it goes into adduction as he is sleeping. Unable to side sleep anymore. Pt states the the shoulder hurts with walking and it is creating pain from just sitting at his side. Pt did have an MRI that did show labral tear. Pt states that that shoulder pain has been ongoing off and on for about 5 years (beyond time of imaging) Jogging does not increase pain.  Lifting weights did not cause pain. Denies NT.  Denies history of neck involvement/pain. Pt states that the pain will also feel it into the triceps. Feels similar aching sensation. Pt is L hand dominant. Has not noticed weakness into the  R UE. No dropping of items. Movement during ADL cannot recreate pain.   Pt has had an MRI that shows C6 stenosis. He has been doing light HEP for the neck and shoulders: ROM  Eval:  Pt has been compliant with WB precautions. Pt has been resting and protecting the hip appropriately. The lateral thigh on the L is aching and painful on occasion. Pt has been using aspirin and icing as directed. Denies NT. Denies s/s of infection. Pt states he has not been icing as much recently. Was not provided a brace post-op.   PERTINENT HISTORY: N/A PAIN:   PAIN:  Are you having pain? No none at rest but  does come on with sleep and walking VAS scale: 0/10 Pain location:  R rhomboid area Pain description:aching  Aggravating factors: walking,cross body adduction Relieving factors: massage/theracane   Are you having pain? No: NPRS scale: 0/10 Pain location: L lateral Pain description: aching Aggravating factors: movement  Relieving factors: rest/ meds   PRECAUTIONS: Other: L hip labral repair  RED FLAGS: None   WEIGHT BEARING RESTRICTIONS: Yes TTWB for first two weeks  FALLS:  Has patient fallen in last 6 months? No  LIVING ENVIRONMENT: Lives with: lives with their family Lives in: House/apartment Stairs: 2nd story home Has following equipment at home: Crutches  OCCUPATION: N/A  Running- flat road with occasional trails/hills  PLOF: Independent  PATIENT GOALS: be able to run about 3 miles again; sleep on shoulder/reduce shoulder pain   OBJECTIVE:  Note: Objective measures were completed at Evaluation unless otherwise noted.  DIAGNOSTIC FINDINGS:   FINDINGS: Rotator cuff:  Intact without significant tendinosis.   Muscles:  No focal muscular atrophy or edema.   Biceps long head:  Intact and normally positioned.   Acromioclavicular Joint: Normal acromioclavicular joint. No significant fluid or contrast is present in the subacromial/subdeltoid bursa.   Glenohumeral Joint: Distended with intra-articular contrast. No chondral defect.   Labrum: Superior labral tear (series 5, image 10). Remaining labrum is intact.   Bones: No acute fracture or dislocation. No suspicious bone lesion.   Other: None.   IMPRESSION: 1. Superior labral tear.  IMPRESSION: 1. Broad-based posterior disc bulge with uncovertebral spurring at C5-6 with resultant moderate spinal stenosis, with mild left C6 foraminal narrowing. 2. Additional mild noncompressive disc bulging at C4-5 and C6-7 without stenosis or impingement.  MMT in lbs  Right 1/13  Left 1/13  Shoulder flexion 39.6  49.3  Shoulder extension    Shoulder abduction 34.5 37.0  Shoulder protaction 34.3 40.8  Shoulder adduction    Shoulder internal rotation 5/5 5/5  Shoulder external rotation 4+/5 4+/5  (Blank rows = not tested)   Shoulder AROM symmetrical and WFL bilat; no recreation of pain    CERVICAL ROM:   Active ROM A/PROM (deg) eval  Flexion 100%  Extension 60%  Right lateral flexion 75% R p!  Left lateral flexion 75%  Right rotation 80%  Left rotation 80%   (Blank rows = not tested)    C/S joint mobility: lack of R side glide and ext   T/S joint moiblity: significantly hypomobile with ext and rotation joint glides      TODAY'S TREATMENT:  DATE:    3/3 Recumbent bike L 3 x13min Prone H abduction 2# x20, 3# x20 Prone Low trap 3# 2x01 1/2 knee hip flexor stretch Standing quad stretch Squats x10 following stretches Lat pull downs 40lbs x10 (for observation of form for independent gym program) Triceps press 55lbs 2x10 Shoulder press (to observe form) 2-3lb dumbbells Discussion of gym program and HEP Reviewed form with HEP exercises      2/13 L hip PROM  IASTM- roller to L quads  Recumbent bike L 3 x23min Supine SLR 2x10ea Supine chin tuck 5" x10 Prone quad stretch with 1/2 roll under thigh Sidelying hip abduction 2# 3x10L Horizontal abduction RTB 2x15  Staggered eccentric STS 3x10 L posterior Reverse lunges at rail x 10 alternating Serratus raises RTB 2x10 Drivers RTB 4U98 Side stepping with squat x 3 laps (1/2 hall) Blue TB at ankles 2x10   2/6 L hip PROM   Lateral step down 3x8 8"  RTB serratus punch in standing 3x10 Diagonals with RTB PNF D2 2x10 Horizontal abduction RTB 2x15 Bilateral ER RTB x15 (stopped due to lateral forearm discomfort) Staggered STS 2x10 L posterior Reverse lunges at rail x 10 alternating Half kneeling hip  flexor 30s 3x Serratus raises RTB 2x10 Drivers RTB 1X91 Wall clocks x10 RTB Side stepping with squats x 3 laps (1/2 hall) Blue TB at ankles 2x10    2/3 L hip mulligan mob grade III-IV inf and lateral  FABER slide 3s 15x Butterfly 30s 2x Lateral step down 3x8 8"  RTB serratus punch in standing 3x10 Diagonals with RTB 3x5 15lbs goblet squat 3x8  Half kneeling hip flexor 30s 3x   1/17 FOTO 80% STM to R upper traps Cupping to R UT  Bil ER in standing YTB 2x10 Serratus flexion with YTB x10 Supine thoracic ext over 1/2 roll x10 Supine horizontal abd RTB 2x10  Single leg bridge 3" 2x10 L LE Runner step up with heel raise 8" x20 Staggered sit to stands from elevated plinth 2x5 (very fatiguing) Standing hip abd RTB at ankles 2x10ea   1/13  T4-10 CPA grade IV C3-6 UPA on R grade III   Exercises - Thoracic Extension Mobilization on Foam Roll  - 1 x daily - 7 x weekly - 2 sets - 10 reps - 2s hold - Shoulder External Rotation and Scapular Retraction with Resistance  - 3-4 x daily - 7 x weekly - 1 sets - 10 reps   1/9  Recumbent bike Lvl 1 6 min  STM to proximal lateral quad TPR to R periscapular mm  Seated scap retraction 5" x20  Single leg bridge 5" 2x10 L LE Supine SLR 2x10 Sidelying hip abduction 3x10 Runner step up 8" x30L Static lunges at rail 2x10ea  Squats 2x10 with 3 seconds hold, seconds set (only 9 reps with second set, pt fatigued and lost balance, falling to ground from squat position. Did not hurt self and able to stand up quickly and independently.  Able to stand independently following this.) SLS on airex with alternating lateral reaches x10ea LAQ 5# 5" 2x10   PATIENT EDUCATION:  Education details: anatomy, exercise progression, DOMS expectations, muscle firing,  envelope of function, HEP, POC Person educated: Patient Education method: Explanation, Demonstration, Tactile cues, Verbal cues, and Handouts Education comprehension: verbalized  understanding, returned demonstration, verbal cues required, and tactile cues required   HOME EXERCISE PROGRAM:   Access Code: [email or web address] (prefers print) URL: [email or web address] Date: 10/06/2023 Prepared by: Zebedee Iba    Access Code:  [email or web address] (shoulder; texted to pt)  URL: [email or web address] Date: 12/08/2023 Prepared by: Zebedee Iba    ASSESSMENT:   CLINICAL IMPRESSION: Spent time on trigger point release to right upper trap and levator Scap where multiple trigger points were present.  This did improve following manual therapy.  Observed performance with squats due to patient complaint of anterior hip discomfort.  Advised patient to stretch quad and hip flexors prior to performing squats.  Patient did this in clinic which relieved the pain.  Patient would like to return to previous level of activity at [facility].  Patient wishes to return to running pain-free as well.  Instructed patient to try elliptical at [facility] if this is pain-free.  Reviewed types of machines and free weights he can begin using with focus on correct form and muscle engagement.  Trialed some of these in the clinic today with good performance and understanding of proper mechanics.  Patient to initiate gym program between now and next visit.  Will discuss tolerance with this and modify exercises as needed.   Re-evaluation today for L shoulder to be combined with hip case. Pt's s/s appear consistent with thoracic/mid back type pain due to static positioning and positional, static over-lengthening of scapular retractors. Pain into R triceps appears to be referral pain vs cervicogenic. Cervical testing does not recreated distal pain. Pt is very stiff into T/S with significant lack of ext. Pt tends to sit with shoulder rounded and IR which may be contributing to discomfort on L. Plan to continue with thoracic mobility and general scapular retractor strengthening at future sessions.    OBJECTIVE  IMPAIRMENTS: decreased activity tolerance, decreased balance, decreased endurance, decreased mobility, decreased ROM, decreased strength, hypomobility, increased muscle spasms, impaired flexibility, improper body mechanics, postural dysfunction, and pain.    ACTIVITY LIMITATIONS: lifting, squatting, locomotion level, and dressing   PARTICIPATION LIMITATIONS: interpersonal relationship, community activity, and exercise   PERSONAL FACTORS: Past/current experiences and Time since onset of injury/illness/exacerbation are also affecting patient's functional outcome.    REHAB POTENTIAL: Good   CLINICAL DECISION MAKING: Stable/uncomplicated   EVALUATION COMPLEXITY: Low     GOALS:     SHORT TERM GOALS: Target date: 11/17/2023       Pt will become independent with HEP in order to demonstrate synthesis of PT education.   Goal status: met   2. Pt will be able to demonstrate full hip AROM in order to demonstrate functional improvement in LE function for self-care and house hold duties.      Goal status: ongoing   3.  Pt will report at least 2 pt reduction on NPRS scale for pain in order to demonstrate functional improvement with household activity, self care, and ADL.    Goal status: met  4. Pt will report at least 2 pt reduction on NPRS scale for pain in order to demonstrate functional improvement with household activity, self care, and ADL.   Goal status: INITIAL   LONG TERM GOALS: Target date: 12/29/2023         Pt  will become independent with final HEP in order to demonstrate synthesis of PT education.   Goal status: INITIAL   2.  Pt will score >/= 71 on FOTO to demonstrate improvement in perceived L hip function.      Goal status: INITIAL   3.  Pt will be able to demonstrate 20x 8" box step downs without form deviation or pain in order to demonstrate functional improvement in LE function for return to hopping/jumping.  Goal status: INITIAL   4.  Pt will be able to  demonstrate 80% strength with HHD in order to demonstrate functional improvement and tolerance to return to running.    Goal status: INITIAL   5. Pt will be able to demonstrate ability to DL hop without pain in order to demonstrate functional improvement and tolerance to low level plyometric loading.     Goal status: INITIAL  6. Pt will be able to demonstrate/report ability to sit/stand/sleep for extended periods of time without pain in order to demonstrate functional improvement and tolerance to static positioning.   Goal status: INITIAL       PLAN:   PT FREQUENCY: 1-2x/week   PT DURATION: 12 weeks (combine with hip labral repair episode)   PLANNED INTERVENTIONS: Therapeutic exercises, Therapeutic activity, Neuromuscular re-education, Balance training, Gait training, Patient/Family education, Self Care, Joint mobilization, Joint manipulation, Stair training, Aquatic Therapy, Dry Needling, Electrical stimulation, Spinal manipulation, Spinal mobilization, Cryotherapy, Moist heat, scar mobilization, Splintting, Taping, Vasopneumatic device, Traction, Ultrasound, Ionotophoresis 4mg /ml Dexamethasone, Manual therapy, and Re-evaluation   PLAN FOR NEXT SESSION: hip arthroscopy protocol; ROM, muscle activation; STM and joint mobs PRN    Lenore Manner, PTA 01/26/2024, 11:28 AM [place] [facility] at [facility] [address] [place], [place], [address] Phone: [phone]   Fax:  [phone]  Patient Details  Name: Zachary Little MRN: [id number] Date of Birth: November 30, 1980 Referring Provider:  Huel Cote, MD  Encounter Date: 01/26/2024   Lenore Manner, PTA 01/26/2024, 11:28 AM  [place] [facility] at [facility] [address] [place], [place], [address] Phone: [phone]   Fax:  [phone]

## 2024-01-28 ENCOUNTER — Ambulatory Visit (HOSPITAL_BASED_OUTPATIENT_CLINIC_OR_DEPARTMENT_OTHER): Payer: 59

## 2024-02-02 ENCOUNTER — Ambulatory Visit (HOSPITAL_BASED_OUTPATIENT_CLINIC_OR_DEPARTMENT_OTHER): Payer: 59 | Admitting: Physical Therapy

## 2024-02-02 ENCOUNTER — Encounter (HOSPITAL_BASED_OUTPATIENT_CLINIC_OR_DEPARTMENT_OTHER): Payer: Self-pay | Admitting: Physical Therapy

## 2024-02-02 DIAGNOSIS — G8929 Other chronic pain: Secondary | ICD-10-CM | POA: Diagnosis not present

## 2024-02-02 DIAGNOSIS — M6281 Muscle weakness (generalized): Secondary | ICD-10-CM | POA: Diagnosis not present

## 2024-02-02 DIAGNOSIS — R262 Difficulty in walking, not elsewhere classified: Secondary | ICD-10-CM

## 2024-02-02 DIAGNOSIS — M25511 Pain in right shoulder: Secondary | ICD-10-CM | POA: Diagnosis not present

## 2024-02-02 DIAGNOSIS — M25552 Pain in left hip: Secondary | ICD-10-CM

## 2024-02-02 NOTE — Therapy (Signed)
 OUTPATIENT PHYSICAL THERAPY SHOULDER TREATMENT      Patient Name: Zachary Little MRN: [id number] DOB:August 19, 1981, 43 y.o., male Today's Date: 02/02/2024  END OF SESSION:  PT End of Session - 02/02/24 1018     Visit Number 17    Number of Visits 26    Date for PT Re-Evaluation 03/07/24    Authorization Type [facility] [employer]    PT Start Time 0932    PT Stop Time 1014    PT Time Calculation (min) 42 min    Activity Tolerance Patient tolerated treatment well    Behavior During Therapy WFL for tasks assessed/performed                           Past Medical History:  Diagnosis Date   Anxiety    Blood transfusion without reported diagnosis 2010   During Chemo Treatments   Hypothyroidism    Leukemia, acute myeloid, in remission (HCC) 2010   Thyroid disease    Past Surgical History:  Procedure Laterality Date   CYSTECTOMY Left    Scrotum   PORT-A-CATH REMOVAL     WISDOM TOOTH EXTRACTION     Patient Active Problem List   Diagnosis Date Noted   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   Chronic right hip pain 09/23/2023   Labral tear of left hip joint 10/01/2022   DDD (degenerative disc disease), cervical, 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 C6 foraminal stenosis with left-sided periscapular discomfort 04/23/2022   Pes anserinus bursitis of left knee 03/19/2022   Chronic pain syndrome 12/29/2019   Neuropathic pain of thigh, right 12/29/2019   Labral tear of shoulder, right, sequela 10/06/2019   Depression, major, single episode, 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   Erectile dysfunction 03/09/2018   Obesity 03/09/2018   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 Left hip labral repair Left hip CAM debridement  SUBJECTIVE:   SUBJECTIVE STATEMENT:  Pt notes R shoulder pain with walking still. Pt still notes L anterior hip and thigh pain with increase in walking distance.   Eval: Pt is here for the R should evaluation today. Pt notes that the R shoulder blade will hurt up to a 6/10 as it goes into adduction as he is sleeping. Unable to side sleep anymore. Pt states the the shoulder hurts with walking and it is creating pain from just sitting at his side. Pt did have an MRI that did show labral tear. Pt states that that shoulder pain has been ongoing off and on for about 5 years (beyond time of imaging) Jogging does not increase pain.  Lifting weights did not cause pain. Denies NT.  Denies history of neck involvement/pain. Pt states that the pain will also feel it into the triceps. Feels similar aching sensation. Pt is L hand dominant. Has not noticed weakness into the R UE. No dropping of items. Movement during ADL cannot recreate  pain.   Pt has had an MRI that shows C6 stenosis. He has been doing light HEP for the neck and shoulders: ROM  Eval:  Pt has been compliant with WB precautions. Pt has been resting and protecting the hip appropriately. The lateral thigh on the L is aching and painful on occasion. Pt has been using aspirin and icing as directed. Denies NT. Denies s/s of infection. Pt states he has not been icing as much recently. Was not provided a brace post-op.   PERTINENT HISTORY: N/A PAIN:   PAIN:  Are you having pain? No none at rest but does come on with sleep and walking VAS scale: 0/10 Pain location:  R rhomboid area Pain  description:aching  Aggravating factors: walking,cross body adduction Relieving factors: massage/theracane   Are you having pain? No: NPRS scale: 0/10 Pain location: L lateral Pain description: aching Aggravating factors: movement  Relieving factors: rest/ meds   PRECAUTIONS: Other: L hip labral repair  RED FLAGS: None   WEIGHT BEARING RESTRICTIONS: Yes TTWB for first two weeks  FALLS:  Has patient fallen in last 6 months? No  LIVING ENVIRONMENT: Lives with: lives with their family Lives in: House/apartment Stairs: 2nd story home Has following equipment at home: Crutches  OCCUPATION: N/A  Running- flat road with occasional trails/hills  PLOF: Independent  PATIENT GOALS: be able to run about 3 miles again; sleep on shoulder/reduce shoulder pain   OBJECTIVE:  Note: Objective measures were completed at Evaluation unless otherwise noted.  DIAGNOSTIC FINDINGS:   FINDINGS: Rotator cuff:  Intact without significant tendinosis.   Muscles:  No focal muscular atrophy or edema.   Biceps long head:  Intact and normally positioned.   Acromioclavicular Joint: Normal acromioclavicular joint. No significant fluid or contrast is present in the subacromial/subdeltoid bursa.   Glenohumeral Joint: Distended with intra-articular contrast. No chondral defect.   Labrum: Superior labral tear (series 5, image 10). Remaining labrum is intact.   Bones: No acute fracture or dislocation. No suspicious bone lesion.   Other: None.   IMPRESSION: 1. Superior labral tear.  IMPRESSION: 1. Broad-based posterior disc bulge with uncovertebral spurring at C5-6 with resultant moderate spinal stenosis, with mild left C6 foraminal narrowing. 2. Additional mild noncompressive disc bulging at C4-5 and C6-7 without stenosis or impingement.  MMT in lbs  Right 1/13  Left 1/13  Shoulder flexion 39.6 49.3  Shoulder extension    Shoulder abduction 34.5 37.0  Shoulder protaction 34.3 40.8   Shoulder adduction    Shoulder internal rotation 5/5 5/5  Shoulder external rotation 4+/5 4+/5  (Blank rows = not tested)   Shoulder AROM symmetrical and WFL bilat; no recreation of pain  MMT Right 3/10 Left 3/10  Hip flexion 80.6 74.5  Hip extension    Hip abduction 67.9 56.7  Hip adduction    Hip internal rotation 37.2 28.2  Hip external rotation 30.4 28.8  Knee flexion    Knee extension 48.0 46.5  Ankle dorsiflexion    Ankle plantarflexion    Ankle inversion    Ankle eversion     (Blank rows = not tested)   CERVICAL ROM:   Active ROM A/PROM (deg) eval  Flexion 100%  Extension 60%  Right lateral flexion 75% R p!  Left lateral flexion 75%  Right rotation 80%  Left rotation 80%   (Blank rows = not tested)    C/S joint mobility: lack of R side glide and ext   T/S joint moiblity: significantly hypomobile with ext  and rotation joint glides      TODAY'S TREATMENT:                                                                                                                              DATE:   3/10  Prone CPA  and UPA grade IV C7-T5 T1-5 Grade III rotational mob (on spinous process)  Seated cable row (wide grip) 3x8  DL hopping 16X 2x; SL hopping 30s  Skipping 47ft 2x Cat/cow 2x10      3/3 Recumbent bike L 3 x42min Prone H abduction 2# x20, 3# x20 Prone Low trap 3# 2x01 1/2 knee hip flexor stretch Standing quad stretch Squats x10 following stretches Lat pull downs 40lbs x10 (for observation of form for independent gym program) Triceps press 55lbs 2x10 Shoulder press (to observe form) 2-3lb dumbbells Discussion of gym program and HEP Reviewed form with HEP exercises      2/13 L hip PROM  IASTM- roller to L quads  Recumbent bike L 3 x41min Supine SLR 2x10ea Supine chin tuck 5" x10 Prone quad stretch with 1/2 roll under thigh Sidelying hip abduction 2# 3x10L Horizontal abduction RTB 2x15  Staggered eccentric STS 3x10 L  posterior Reverse lunges at rail x 10 alternating Serratus raises RTB 2x10 Drivers RTB 0R60 Side stepping with squat x 3 laps (1/2 hall) Blue TB at ankles 2x10   2/6 L hip PROM   Lateral step down 3x8 8"  RTB serratus punch in standing 3x10 Diagonals with RTB PNF D2 2x10 Horizontal abduction RTB 2x15 Bilateral ER RTB x15 (stopped due to lateral forearm discomfort) Staggered STS 2x10 L posterior Reverse lunges at rail x 10 alternating Half kneeling hip flexor 30s 3x Serratus raises RTB 2x10 Drivers RTB 4V40 Wall clocks x10 RTB Side stepping with squats x 3 laps (1/2 hall) Blue TB at ankles 2x10    2/3 L hip mulligan mob grade III-IV inf and lateral  FABER slide 3s 15x Butterfly 30s 2x Lateral step down 3x8 8"  RTB serratus punch in standing 3x10 Diagonals with RTB 3x5 15lbs goblet squat 3x8  Half kneeling hip flexor 30s 3x   1/17 FOTO 80% STM to R upper traps Cupping to R UT  Bil ER in standing YTB 2x10 Serratus flexion with YTB x10 Supine thoracic ext over 1/2 roll x10 Supine horizontal abd RTB 2x10  Single leg bridge 3" 2x10 L LE Runner step up with heel raise 8" x20 Staggered sit to stands from elevated plinth 2x5 (very fatiguing) Standing hip abd RTB at ankles 2x10ea   1/13  T4-10 CPA grade IV C3-6 UPA on R grade III   Exercises - Thoracic Extension Mobilization on Foam Roll  - 1 x daily - 7 x weekly - 2 sets - 10 reps - 2s hold - Shoulder External Rotation and Scapular Retraction with Resistance  - 3-4 x daily - 7 x weekly - 1 sets - 10 reps  1/9  Recumbent bike Lvl 1 6 min  STM to proximal lateral quad TPR to R periscapular mm  Seated scap retraction 5" x20  Single leg bridge 5" 2x10 L LE Supine SLR 2x10 Sidelying hip abduction 3x10 Runner step up 8" x30L Static lunges at rail 2x10ea  Squats 2x10 with 3 seconds hold, seconds set (only 9 reps with second set, pt fatigued and lost balance, falling to ground from squat position. Did not  hurt self and able to stand up quickly and independently.  Able to stand independently following this.) SLS on airex with alternating lateral reaches x10ea LAQ 5# 5" 2x10   PATIENT EDUCATION:  Education details: anatomy, exercise progression, DOMS expectations, muscle firing,  envelope of function, HEP, POC Person educated: Patient Education method: Explanation, Demonstration, Tactile cues, Verbal cues, and Handouts Education comprehension: verbalized understanding, returned demonstration, verbal cues required, and tactile cues required   HOME EXERCISE PROGRAM:   Access Code: [email or web address] (prefers print) URL: [email or web address] Date: 10/06/2023 Prepared by: Zebedee Iba    Access Code: [phone] (shoulder; texted to pt)  URL: [email or web address] Date: 12/08/2023 Prepared by: Zebedee Iba    ASSESSMENT:   CLINICAL IMPRESSION: Dynamometry testing reveals that patient is at 80% of the contralateral hip.  At this time, patient given precautions and indications for starting jogging program.  Plyometrics introduced today for home exercise to assess tolerance to running.  Patient also advised on gym-based exercise to work on postural strengthening and thoracic mobility as patient is extremely stiff into thoracic extension.  Plan for patient to test home exercise program and in the coming days prior to next session to assess for response to treatment and progression of home exercise program.  Home exercise program updated accordingly today.  Plan to continue with upper quarter strength in left lower extremity strength for return to normal exercise and jogging. No pain noted today with exercise but pt was sore from manual into T/S.     OBJECTIVE IMPAIRMENTS: decreased activity tolerance, decreased balance, decreased endurance, decreased mobility, decreased ROM, decreased strength, hypomobility, increased muscle spasms, impaired flexibility, improper body mechanics,  postural dysfunction, and pain.    ACTIVITY LIMITATIONS: lifting, squatting, locomotion level, and dressing   PARTICIPATION LIMITATIONS: interpersonal relationship, community activity, and exercise   PERSONAL FACTORS: Past/current experiences and Time since onset of injury/illness/exacerbation are also affecting patient's functional outcome.    REHAB POTENTIAL: Good   CLINICAL DECISION MAKING: Stable/uncomplicated   EVALUATION COMPLEXITY: Low     GOALS:     SHORT TERM GOALS: Target date: 11/17/2023       Pt will become independent with HEP in order to demonstrate synthesis of PT education.   Goal status: met   2. Pt will be able to demonstrate full hip AROM in order to demonstrate functional improvement in LE function for self-care and house hold duties.      Goal status: ongoing   3.  Pt will report at least 2 pt reduction on NPRS scale for pain in order to demonstrate functional improvement with household activity, self care, and ADL.    Goal status: met  4. Pt will report at least 2 pt reduction on NPRS scale for pain in order to demonstrate functional improvement with household activity, self care, and ADL.   Goal status: INITIAL   LONG TERM GOALS: Target date: 12/29/2023         Pt  will become independent with final HEP in order to demonstrate synthesis of PT education.   Goal  status: INITIAL   2.  Pt will score >/= 71 on FOTO to demonstrate improvement in perceived L hip function.      Goal status: INITIAL   3.  Pt will be able to demonstrate 20x 8" box step downs without form deviation or pain in order to demonstrate functional improvement in LE function for return to hopping/jumping.    Goal status: INITIAL   4.  Pt will be able to demonstrate 80% strength with HHD in order to demonstrate functional improvement and tolerance to return to running.    Goal status: INITIAL   5. Pt will be able to demonstrate ability to DL hop without pain in order to  demonstrate functional improvement and tolerance to low level plyometric loading.     Goal status: INITIAL  6. Pt will be able to demonstrate/report ability to sit/stand/sleep for extended periods of time without pain in order to demonstrate functional improvement and tolerance to static positioning.   Goal status: INITIAL       PLAN:   PT FREQUENCY: 1-2x/week   PT DURATION: 12 weeks (combine with hip labral repair episode)   PLANNED INTERVENTIONS: Therapeutic exercises, Therapeutic activity, Neuromuscular re-education, Balance training, Gait training, Patient/Family education, Self Care, Joint mobilization, Joint manipulation, Stair training, Aquatic Therapy, Dry Needling, Electrical stimulation, Spinal manipulation, Spinal mobilization, Cryotherapy, Moist heat, scar mobilization, Splintting, Taping, Vasopneumatic device, Traction, Ultrasound, Ionotophoresis 4mg /ml Dexamethasone, Manual therapy, and Re-evaluation   PLAN FOR NEXT SESSION: hip arthroscopy protocol; ROM, muscle activation; STM and joint mobs PRN    Zebedee Iba, PT 02/02/2024, 10:18 AM [place] [facility] [address] [place], [place], [address] Phone: [phone]   Fax:  [phone]

## 2024-02-04 ENCOUNTER — Ambulatory Visit (HOSPITAL_BASED_OUTPATIENT_CLINIC_OR_DEPARTMENT_OTHER): Payer: 59 | Admitting: Physical Therapy

## 2024-02-09 ENCOUNTER — Ambulatory Visit (HOSPITAL_BASED_OUTPATIENT_CLINIC_OR_DEPARTMENT_OTHER): Payer: 59 | Admitting: Physical Therapy

## 2024-02-09 ENCOUNTER — Encounter (HOSPITAL_BASED_OUTPATIENT_CLINIC_OR_DEPARTMENT_OTHER): Payer: Self-pay | Admitting: Physical Therapy

## 2024-02-09 DIAGNOSIS — R262 Difficulty in walking, not elsewhere classified: Secondary | ICD-10-CM | POA: Diagnosis not present

## 2024-02-09 DIAGNOSIS — M6281 Muscle weakness (generalized): Secondary | ICD-10-CM | POA: Diagnosis not present

## 2024-02-09 DIAGNOSIS — G8929 Other chronic pain: Secondary | ICD-10-CM | POA: Diagnosis not present

## 2024-02-09 DIAGNOSIS — M25552 Pain in left hip: Secondary | ICD-10-CM

## 2024-02-09 DIAGNOSIS — M25511 Pain in right shoulder: Secondary | ICD-10-CM | POA: Diagnosis not present

## 2024-02-09 NOTE — Therapy (Signed)
 OUTPATIENT PHYSICAL THERAPY SHOULDER TREATMENT      Patient Name: Zachary Little MRN: [id number] DOB:1981-08-13, 43 y.o., male Today's Date: 02/09/2024  END OF SESSION:  PT End of Session - 02/09/24 1036     Visit Number 18    Number of Visits 26    Date for PT Re-Evaluation 03/07/24    Authorization Type [facility] [employer]    PT Start Time 1016    PT Stop Time 1058    PT Time Calculation (min) 42 min    Activity Tolerance Patient tolerated treatment well    Behavior During Therapy WFL for tasks assessed/performed                            Past Medical History:  Diagnosis Date   Anxiety    Blood transfusion without reported diagnosis 2010   During Chemo Treatments   Hypothyroidism    Leukemia, acute myeloid, in remission (HCC) 2010   Thyroid disease    Past Surgical History:  Procedure Laterality Date   CYSTECTOMY Left    Scrotum   PORT-A-CATH REMOVAL     WISDOM TOOTH EXTRACTION     Patient Active Problem List   Diagnosis Date Noted   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   Chronic right hip pain 09/23/2023   Labral tear of left hip joint 10/01/2022   DDD (degenerative disc disease), cervical, 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 C6 foraminal stenosis with left-sided periscapular discomfort 04/23/2022   Pes anserinus bursitis of left knee 03/19/2022   Chronic pain syndrome 12/29/2019   Neuropathic pain of thigh, right 12/29/2019   Labral tear of shoulder, right, sequela 10/06/2019   Depression, major, single episode, 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   Erectile dysfunction 03/09/2018   Obesity 03/09/2018   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/02/2018   Knee pain 10/31/2017   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 Left hip labral repair Left hip CAM debridement  SUBJECTIVE:   SUBJECTIVE STATEMENT:  Pt states he has been jogging a few times since last session- no serious complaints. Feels achey into L thigh. Pt did have some improvement in R posterior shoulder pain with mobility exercise.   Eval: Pt is here for the R should evaluation today. Pt notes that the R shoulder blade will hurt up to a 6/10 as it goes into adduction as he is sleeping. Unable to side sleep anymore. Pt states the the shoulder hurts with walking and it is creating pain from just sitting at his side. Pt did have an MRI that did show labral tear. Pt states that that shoulder pain has been ongoing off and on for about 5 years (beyond time of imaging) Jogging does not increase pain.  Lifting weights did not cause pain. Denies NT.  Denies history of neck involvement/pain. Pt states that the pain will also feel it into the triceps. Feels similar aching sensation. Pt is L hand dominant. Has not noticed weakness into  the R UE. No dropping of items. Movement during ADL cannot recreate pain.   Pt has had an MRI that shows C6 stenosis. He has been doing light HEP for the neck and shoulders: ROM  Eval:  Pt has been compliant with WB precautions. Pt has been resting and protecting the hip appropriately. The lateral thigh on the L is aching and painful on occasion. Pt has been using aspirin and icing as directed. Denies NT. Denies s/s of infection. Pt states he has not been icing as much recently. Was not provided a brace post-op.   PERTINENT HISTORY: N/A PAIN:   PAIN:  Are you having pain? No none at rest but does come on with sleep and  walking VAS scale: 0/10 Pain location:  R rhomboid area Pain description:aching  Aggravating factors: walking,cross body adduction Relieving factors: massage/theracane   Are you having pain? No: NPRS scale: 0/10 Pain location: L lateral Pain description: aching Aggravating factors: movement  Relieving factors: rest/ meds   PRECAUTIONS: Other: L hip labral repair  RED FLAGS: None   WEIGHT BEARING RESTRICTIONS: Yes TTWB for first two weeks  FALLS:  Has patient fallen in last 6 months? No  LIVING ENVIRONMENT: Lives with: lives with their family Lives in: House/apartment Stairs: 2nd story home Has following equipment at home: Crutches  OCCUPATION: N/A  Running- flat road with occasional trails/hills  PLOF: Independent  PATIENT GOALS: be able to run about 3 miles again; sleep on shoulder/reduce shoulder pain   OBJECTIVE:  Note: Objective measures were completed at Evaluation unless otherwise noted.  DIAGNOSTIC FINDINGS:   FINDINGS: Rotator cuff:  Intact without significant tendinosis.   Muscles:  No focal muscular atrophy or edema.   Biceps long head:  Intact and normally positioned.   Acromioclavicular Joint: Normal acromioclavicular joint. No significant fluid or contrast is present in the subacromial/subdeltoid bursa.   Glenohumeral Joint: Distended with intra-articular contrast. No chondral defect.   Labrum: Superior labral tear (series 5, image 10). Remaining labrum is intact.   Bones: No acute fracture or dislocation. No suspicious bone lesion.   Other: None.   IMPRESSION: 1. Superior labral tear.  IMPRESSION: 1. Broad-based posterior disc bulge with uncovertebral spurring at C5-6 with resultant moderate spinal stenosis, with mild left C6 foraminal narrowing. 2. Additional mild noncompressive disc bulging at C4-5 and C6-7 without stenosis or impingement.  MMT in lbs  Right 1/13  Left 1/13  Shoulder flexion 39.6 49.3  Shoulder extension     Shoulder abduction 34.5 37.0  Shoulder protaction 34.3 40.8  Shoulder adduction    Shoulder internal rotation 5/5 5/5  Shoulder external rotation 4+/5 4+/5  (Blank rows = not tested)   Shoulder AROM symmetrical and WFL bilat; no recreation of pain  MMT Right 3/10 Left 3/10  Hip flexion 80.6 74.5  Hip extension    Hip abduction 67.9 56.7  Hip adduction    Hip internal rotation 37.2 28.2  Hip external rotation 30.4 28.8  Knee flexion    Knee extension 48.0 46.5  Ankle dorsiflexion    Ankle plantarflexion    Ankle inversion    Ankle eversion     (Blank rows = not tested)   CERVICAL ROM:   Active ROM A/PROM (deg) eval  Flexion 100%  Extension 60%  Right lateral flexion 75% R p!  Left lateral flexion 75%  Right rotation 80%  Left rotation 80%   (Blank rows = not tested)    C/S joint mobility: lack of R side  glide and ext   T/S joint moiblity: significantly hypomobile with ext and rotation joint glides      TODAY'S TREATMENT:                                                                                                                              DATE:   3/17   Prone CPA  and UPA grade IV C7-T5 T1-5 Grade III rotational mob (on spinous process)  Supine piriformis 30s 3x Couch stretch 30s 4x Wall angel 2x10 SL leg press 3x8 55lbs (white machine)  SL knee ext 3x8 (white machine) 25lbs  SL HS curl 3x8 15lbs  3/10  Prone CPA  and UPA grade IV C7-T5 T1-5 Grade III rotational mob (on spinous process)  Seated cable row (wide grip) 3x8  DL hopping 08M 2x; SL hopping 30s  Skipping 67ft 2x Cat/cow 2x10      3/3 Recumbent bike L 3 x8min Prone H abduction 2# x20, 3# x20 Prone Low trap 3# 2x01 1/2 knee hip flexor stretch Standing quad stretch Squats x10 following stretches Lat pull downs 40lbs x10 (for observation of form for independent gym program) Triceps press 55lbs 2x10 Shoulder press (to observe form) 2-3lb dumbbells Discussion of gym  program and HEP Reviewed form with HEP exercises      2/13 L hip PROM  IASTM- roller to L quads  Recumbent bike L 3 x32min Supine SLR 2x10ea Supine chin tuck 5" x10 Prone quad stretch with 1/2 roll under thigh Sidelying hip abduction 2# 3x10L Horizontal abduction RTB 2x15  Staggered eccentric STS 3x10 L posterior Reverse lunges at rail x 10 alternating Serratus raises RTB 2x10 Drivers RTB 5H84 Side stepping with squat x 3 laps (1/2 hall) Blue TB at ankles 2x10   2/6 L hip PROM   Lateral step down 3x8 8"  RTB serratus punch in standing 3x10 Diagonals with RTB PNF D2 2x10 Horizontal abduction RTB 2x15 Bilateral ER RTB x15 (stopped due to lateral forearm discomfort) Staggered STS 2x10 L posterior Reverse lunges at rail x 10 alternating Half kneeling hip flexor 30s 3x Serratus raises RTB 2x10 Drivers RTB 6N62 Wall clocks x10 RTB Side stepping with squats x 3 laps (1/2 hall) Blue TB at ankles 2x10    2/3 L hip mulligan mob grade III-IV inf and lateral  FABER slide 3s 15x Butterfly 30s 2x Lateral step down 3x8 8"  RTB serratus punch in standing 3x10 Diagonals with RTB 3x5 15lbs goblet squat 3x8  Half kneeling hip flexor 30s 3x   1/17 FOTO 80% STM to R upper traps Cupping to R UT  Bil ER in standing YTB 2x10 Serratus flexion with YTB x10 Supine thoracic ext over 1/2 roll x10 Supine horizontal abd RTB 2x10  Single leg bridge 3" 2x10 L LE Runner step up with heel raise 8" x20 Staggered sit to stands from elevated plinth 2x5 (very fatiguing) Standing hip abd RTB at ankles 2x10ea   1/13  T4-10 CPA grade IV C3-6 UPA on R grade III   Exercises - Thoracic Extension Mobilization on Foam Roll  - 1 x daily - 7 x weekly - 2 sets - 10 reps - 2s hold - Shoulder External Rotation and Scapular Retraction with Resistance  - 3-4 x daily - 7 x weekly - 1 sets - 10 reps   1/9  Recumbent bike Lvl 1 6 min  STM to proximal lateral quad TPR to R periscapular mm   Seated scap retraction 5" x20  Single leg bridge 5" 2x10 L LE Supine SLR 2x10 Sidelying hip abduction 3x10 Runner step up 8" x30L Static lunges at rail 2x10ea  Squats 2x10 with 3 seconds hold, seconds set (only 9 reps with second set, pt fatigued and lost balance, falling to ground from squat position. Did not hurt self and able to stand up quickly and independently.  Able to stand independently following this.) SLS on airex with alternating lateral reaches x10ea LAQ 5# 5" 2x10   PATIENT EDUCATION:  Education details: anatomy, exercise progression, DOMS expectations, muscle firing,  envelope of function, HEP, POC Person educated: Patient Education method: Explanation, Demonstration, Tactile cues, Verbal cues, and Handouts Education comprehension: verbalized understanding, returned demonstration, verbal cues required, and tactile cues required   HOME EXERCISE PROGRAM:   Access Code: [email or web address] (prefers print) URL: [email or web address] Date: 10/06/2023 Prepared by: Zebedee Iba    Access Code: [phone] (shoulder; texted to pt)  URL: [email or web address] Date: 12/08/2023 Prepared by: Zebedee Iba    ASSESSMENT:   CLINICAL IMPRESSION: Pt with good response to previous session and has improved upper back pain though still present. Frequency of pain has decreased. Pt Hep progressed for the L LE as well to improve L individual LE strength as quad is limited with output vs R. Plan to continue with upper quarter strength in left lower extremity strength for return to normal exercise and jogging. Pt advised to continue with [employer] running program. Consider progression to  tall box step ups for LE at next as well as SL jump/hop for return to running. Pt would benefit from continued skilled therapy in order to reach goals and maximize functional strength and ROM for return to normalized community mobility and exercise.     OBJECTIVE IMPAIRMENTS: decreased activity  tolerance, decreased balance, decreased endurance, decreased mobility, decreased ROM, decreased strength, hypomobility, increased muscle spasms, impaired flexibility, improper body mechanics, postural dysfunction, and pain.    ACTIVITY LIMITATIONS: lifting, squatting, locomotion level, and dressing   PARTICIPATION LIMITATIONS: interpersonal relationship, community activity, and exercise   PERSONAL FACTORS: Past/current experiences and Time since onset of injury/illness/exacerbation are also affecting patient's functional outcome.    REHAB POTENTIAL: Good   CLINICAL DECISION MAKING: Stable/uncomplicated   EVALUATION COMPLEXITY: Low     GOALS:     SHORT TERM GOALS: Target date: 11/17/2023       Pt will become independent with HEP in order to demonstrate synthesis of PT education.   Goal status: met   2. Pt will be able to demonstrate full hip AROM in order to demonstrate functional improvement in LE function for self-care and house hold duties.      Goal status: ongoing   3.  Pt will report at least 2 pt reduction on NPRS scale for pain in order to demonstrate functional improvement with household activity, self care, and ADL.    Goal status: met  4. Pt will report at least 2 pt reduction on NPRS scale for pain in order  to demonstrate functional improvement with household activity, self care, and ADL.   Goal status: INITIAL   LONG TERM GOALS: Target date: 12/29/2023         Pt  will become independent with final HEP in order to demonstrate synthesis of PT education.   Goal status: INITIAL   2.  Pt will score >/= 71 on FOTO to demonstrate improvement in perceived L hip function.      Goal status: INITIAL   3.  Pt will be able to demonstrate 20x 8" box step downs without form deviation or pain in order to demonstrate functional improvement in LE function for return to hopping/jumping.    Goal status: INITIAL   4.  Pt will be able to demonstrate 80% strength with HHD  in order to demonstrate functional improvement and tolerance to return to running.    Goal status: INITIAL   5. Pt will be able to demonstrate ability to DL hop without pain in order to demonstrate functional improvement and tolerance to low level plyometric loading.     Goal status: INITIAL  6. Pt will be able to demonstrate/report ability to sit/stand/sleep for extended periods of time without pain in order to demonstrate functional improvement and tolerance to static positioning.   Goal status: INITIAL       PLAN:   PT FREQUENCY: 1-2x/week   PT DURATION: 12 weeks (combine with hip labral repair episode)   PLANNED INTERVENTIONS: Therapeutic exercises, Therapeutic activity, Neuromuscular re-education, Balance training, Gait training, Patient/Family education, Self Care, Joint mobilization, Joint manipulation, Stair training, Aquatic Therapy, Dry Needling, Electrical stimulation, Spinal manipulation, Spinal mobilization, Cryotherapy, Moist heat, scar mobilization, Splintting, Taping, Vasopneumatic device, Traction, Ultrasound, Ionotophoresis 4mg /ml Dexamethasone, Manual therapy, and Re-evaluation   PLAN FOR NEXT SESSION: hip arthroscopy protocol; ROM, muscle activation; STM and joint mobs PRN    Zebedee Iba, PT 02/09/2024, 10:59 AM [facility] at [facility] [address] [place], [place], [address] Phone: [phone]   Fax:  [phone]

## 2024-02-11 ENCOUNTER — Encounter (HOSPITAL_BASED_OUTPATIENT_CLINIC_OR_DEPARTMENT_OTHER): Payer: 59

## 2024-02-16 ENCOUNTER — Ambulatory Visit (HOSPITAL_BASED_OUTPATIENT_CLINIC_OR_DEPARTMENT_OTHER): Payer: 59 | Admitting: Physical Therapy

## 2024-02-16 ENCOUNTER — Encounter (HOSPITAL_BASED_OUTPATIENT_CLINIC_OR_DEPARTMENT_OTHER): Payer: Self-pay | Admitting: Physical Therapy

## 2024-02-16 DIAGNOSIS — G8929 Other chronic pain: Secondary | ICD-10-CM

## 2024-02-16 DIAGNOSIS — M6281 Muscle weakness (generalized): Secondary | ICD-10-CM

## 2024-02-16 DIAGNOSIS — R262 Difficulty in walking, not elsewhere classified: Secondary | ICD-10-CM | POA: Diagnosis not present

## 2024-02-16 DIAGNOSIS — M25511 Pain in right shoulder: Secondary | ICD-10-CM | POA: Diagnosis not present

## 2024-02-16 DIAGNOSIS — M25552 Pain in left hip: Secondary | ICD-10-CM | POA: Diagnosis not present

## 2024-02-16 NOTE — Therapy (Signed)
 OUTPATIENT PHYSICAL THERAPY SHOULDER TREATMENT      Patient Name: Zachary Little MRN: [id number] DOB:1981/07/05, 43 y.o., male Today's Date: 02/16/2024  END OF SESSION:  PT End of Session - 02/16/24 0938     Visit Number 19    Number of Visits 26    Date for PT Re-Evaluation 03/07/24    Authorization Type [facility] [employer]    PT Start Time 0932    PT Stop Time 1012    PT Time Calculation (min) 40 min    Activity Tolerance Patient tolerated treatment well    Behavior During Therapy [facility] for tasks assessed/performed                             Past Medical History:  Diagnosis Date   Anxiety    Blood transfusion without reported diagnosis 2010   During Chemo Treatments   Hypothyroidism    Leukemia, acute myeloid, in remission (HCC) 2010   Thyroid disease    Past Surgical History:  Procedure Laterality Date   CYSTECTOMY Left    Scrotum   PORT-A-CATH REMOVAL     WISDOM TOOTH EXTRACTION     Patient Active Problem List   Diagnosis Date Noted   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   Chronic right hip pain 09/23/2023   Labral tear of left hip joint 10/01/2022   DDD (degenerative disc disease), cervical, 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 C6 foraminal stenosis with left-sided periscapular discomfort 04/23/2022   Pes anserinus bursitis of left knee 03/19/2022   Chronic pain syndrome 12/29/2019   Neuropathic pain of thigh, right 12/29/2019   Labral tear of shoulder, right, sequela 10/06/2019   Depression, major, single episode, 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   Erectile dysfunction 03/09/2018   Obesity 03/09/2018   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/02/2018   Knee pain 10/31/2017   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 Left hip labral repair Left hip CAM debridement  SUBJECTIVE:   SUBJECTIVE STATEMENT:  Pt denies increase in pain since last session. Progressively less pain with hip and shoulder. Pt was able to go exercise at the gym 2x/week and jogged without discomfort.   Worst pain is 4/10 in shoulder when laying on L  Eval: Pt is here for the R should evaluation today. Pt notes that the R shoulder blade will hurt up to a 6/10 as it goes into adduction as he is sleeping. Unable to side sleep anymore. Pt states the the shoulder hurts with walking and it is creating pain from just sitting at his side. Pt did have an MRI that did show labral tear. Pt states that that shoulder pain has been ongoing off and on for about 5 years (beyond time of imaging) Jogging does not increase pain.  Lifting weights did not cause pain. Denies NT.  Denies history of neck involvement/pain. Pt states that the pain will also feel it into the triceps. Feels similar aching sensation. Pt is  L hand dominant. Has not noticed weakness into the R UE. No dropping of items. Movement during ADL cannot recreate pain.   Pt has had an MRI that shows C6 stenosis. He has been doing light HEP for the neck and shoulders: ROM  Eval:  Pt has been compliant with WB precautions. Pt has been resting and protecting the hip appropriately. The lateral thigh on the L is aching and painful on occasion. Pt has been using aspirin and icing as directed. Denies NT. Denies s/s of infection. Pt states he has not been icing as much recently. Was not provided a brace post-op.   PERTINENT HISTORY: N/A PAIN:   PAIN:  Are you having pain? No none at rest but  does come on with sleep and walking VAS scale: 0/10 Pain location:  R rhomboid area Pain description:aching  Aggravating factors: walking,cross body adduction Relieving factors: massage/theracane   Are you having pain? No: NPRS scale: 0/10 Pain location: L lateral Pain description: aching Aggravating factors: movement  Relieving factors: rest/ meds   PRECAUTIONS: Other: L hip labral repair  RED FLAGS: None   WEIGHT BEARING RESTRICTIONS: Yes TTWB for first two weeks  FALLS:  Has patient fallen in last 6 months? No  LIVING ENVIRONMENT: Lives with: lives with their family Lives in: House/apartment Stairs: 2nd story home Has following equipment at home: Crutches  OCCUPATION: N/A  Running- flat road with occasional trails/hills  PLOF: Independent  PATIENT GOALS: be able to run about 3 miles again; sleep on shoulder/reduce shoulder pain   OBJECTIVE:  Note: Objective measures were completed at Evaluation unless otherwise noted.  DIAGNOSTIC FINDINGS:   FINDINGS: Rotator cuff:  Intact without significant tendinosis.   Muscles:  No focal muscular atrophy or edema.   Biceps long head:  Intact and normally positioned.   Acromioclavicular Joint: Normal acromioclavicular joint. No significant fluid or contrast is present in the subacromial/subdeltoid bursa.   Glenohumeral Joint: Distended with intra-articular contrast. No chondral defect.   Labrum: Superior labral tear (series 5, image 10). Remaining labrum is intact.   Bones: No acute fracture or dislocation. No suspicious bone lesion.   Other: None.   IMPRESSION: 1. Superior labral tear.  IMPRESSION: 1. Broad-based posterior disc bulge with uncovertebral spurring at C5-6 with resultant moderate spinal stenosis, with mild left C6 foraminal narrowing. 2. Additional mild noncompressive disc bulging at C4-5 and C6-7 without stenosis or impingement.  MMT in lbs  Right 1/13  Left 1/13  Shoulder flexion 39.6  49.3  Shoulder extension    Shoulder abduction 34.5 37.0  Shoulder protaction 34.3 40.8  Shoulder adduction    Shoulder internal rotation 5/5 5/5  Shoulder external rotation 4+/5 4+/5  (Blank rows = not tested)   Shoulder AROM symmetrical and WFL bilat; no recreation of pain  MMT Right 3/10 Left 3/10  Hip flexion 80.6 74.5  Hip extension    Hip abduction 67.9 56.7  Hip adduction    Hip internal rotation 37.2 28.2  Hip external rotation 30.4 28.8  Knee flexion    Knee extension 48.0 46.5  Ankle dorsiflexion    Ankle plantarflexion    Ankle inversion    Ankle eversion     (Blank rows = not tested)   CERVICAL ROM:   Active ROM A/PROM (deg) eval  Flexion 100%  Extension 60%  Right lateral flexion 75% R p!  Left lateral flexion 75%  Right rotation 80%  Left rotation 80%   (Blank rows = not tested)  C/S joint mobility: lack of R side glide and ext   T/S joint moiblity: significantly hypomobile with ext and rotation joint glides      TODAY'S TREATMENT:                                                                                                                              DATE:   3/24  Recumbent bike lvl 2 5 min   26lb KB farmers carry 4x weight room laps Jefferson curl to box 26lbs KB 3x6 18" weighted box step up 3x8 10lbs each hand 20lb RDL 4x8 (back of leg on box to promote vertical shin angle)   Supramax eccentric SL squat to chair 4x6 10lb KB   3/17   Prone CPA  and UPA grade IV C7-T5 T1-5 Grade III rotational mob (on spinous process)  Supine piriformis 30s 3x Couch stretch 30s 4x Wall angel 2x10 SL leg press 3x8 55lbs (white machine)  SL knee ext 3x8 (white machine) 25lbs  SL HS curl 3x8 15lbs  3/10  Prone CPA  and UPA grade IV C7-T5 T1-5 Grade III rotational mob (on spinous process)  Seated cable row (wide grip) 3x8  DL hopping 16X 2x; SL hopping 30s  Skipping 78ft 2x Cat/cow 2x10      3/3 Recumbent bike L 3 x36min Prone H  abduction 2# x20, 3# x20 Prone Low trap 3# 2x01 1/2 knee hip flexor stretch Standing quad stretch Squats x10 following stretches Lat pull downs 40lbs x10 (for observation of form for independent gym program) Triceps press 55lbs 2x10 Shoulder press (to observe form) 2-3lb dumbbells Discussion of gym program and HEP Reviewed form with HEP exercises      2/13 L hip PROM  IASTM- roller to L quads  Recumbent bike L 3 x55min Supine SLR 2x10ea Supine chin tuck 5" x10 Prone quad stretch with 1/2 roll under thigh Sidelying hip abduction 2# 3x10L Horizontal abduction RTB 2x15  Staggered eccentric STS 3x10 L posterior Reverse lunges at rail x 10 alternating Serratus raises RTB 2x10 Drivers RTB 0R60 Side stepping with squat x 3 laps (1/2 hall) Blue TB at ankles 2x10   2/6 L hip PROM   Lateral step down 3x8 8"  RTB serratus punch in standing 3x10 Diagonals with RTB PNF D2 2x10 Horizontal abduction RTB 2x15 Bilateral ER RTB x15 (stopped due to lateral forearm discomfort) Staggered STS 2x10 L posterior Reverse lunges at rail x 10 alternating Half kneeling hip flexor 30s 3x Serratus raises RTB 2x10 Drivers RTB 4V40 Wall clocks x10 RTB Side stepping with squats x 3 laps (1/2 hall) Blue TB at ankles 2x10    2/3 L hip mulligan mob grade III-IV inf and lateral  FABER slide 3s 15x Butterfly 30s 2x Lateral step down 3x8 8"  RTB serratus punch in standing 3x10 Diagonals with RTB 3x5 15lbs goblet squat 3x8  Half kneeling hip flexor 30s 3x   1/17 FOTO 80% STM to  R upper traps Cupping to R UT  Bil ER in standing YTB 2x10 Serratus flexion with YTB x10 Supine thoracic ext over 1/2 roll x10 Supine horizontal abd RTB 2x10  Single leg bridge 3" 2x10 L LE Runner step up with heel raise 8" x20 Staggered sit to stands from elevated plinth 2x5 (very fatiguing) Standing hip abd RTB at ankles 2x10ea   1/13  T4-10 CPA grade IV C3-6 UPA on R grade III   Exercises -  Thoracic Extension Mobilization on Foam Roll  - 1 x daily - 7 x weekly - 2 sets - 10 reps - 2s hold - Shoulder External Rotation and Scapular Retraction with Resistance  - 3-4 x daily - 7 x weekly - 1 sets - 10 reps   1/9  Recumbent bike Lvl 1 6 min  STM to proximal lateral quad TPR to R periscapular mm  Seated scap retraction 5" x20  Single leg bridge 5" 2x10 L LE Supine SLR 2x10 Sidelying hip abduction 3x10 Runner step up 8" x30L Static lunges at rail 2x10ea  Squats 2x10 with 3 seconds hold, seconds set (only 9 reps with second set, pt fatigued and lost balance, falling to ground from squat position. Did not hurt self and able to stand up quickly and independently.  Able to stand independently following this.) SLS on airex with alternating lateral reaches x10ea LAQ 5# 5" 2x10   PATIENT EDUCATION:  Education details: anatomy, exercise progression, DOMS expectations, muscle firing,  envelope of function, HEP, POC Person educated: Patient Education method: Explanation, Demonstration, Tactile cues, Verbal cues, and Handouts Education comprehension: verbalized understanding, returned demonstration, verbal cues required, and tactile cues required   HOME EXERCISE PROGRAM:   Access Code: [email or web address] (prefers print) URL: [email or web address] Date: 10/06/2023 Prepared by: Zebedee Iba    Access Code: [phone] (shoulder; texted to pt)  URL: [email or web address] Date: 12/08/2023 Prepared by: Zebedee Iba    ASSESSMENT:   CLINICAL IMPRESSION: Pt with improvement in L thigh and R shoulder pain since last session with decrease in frequency of pain. Pt with good tolerance to progressive strength exercise with increasing degrees of freedom and deeper degrees of hip and knee flexion. Pt compound exercises now also target postural strength and thoracic extension positioning. Plan to start single leg jumping progression at next as pt has good tolerance during session  and no increase in discomfort during session, only fatigue. Pt would benefit from continued skilled therapy in order to reach goals and maximize functional strength and ROM for return to normalized community mobility and exercise.     OBJECTIVE IMPAIRMENTS: decreased activity tolerance, decreased balance, decreased endurance, decreased mobility, decreased ROM, decreased strength, hypomobility, increased muscle spasms, impaired flexibility, improper body mechanics, postural dysfunction, and pain.    ACTIVITY LIMITATIONS: lifting, squatting, locomotion level, and dressing   PARTICIPATION LIMITATIONS: interpersonal relationship, community activity, and exercise   PERSONAL FACTORS: Past/current experiences and Time since onset of injury/illness/exacerbation are also affecting patient's functional outcome.    REHAB POTENTIAL: Good   CLINICAL DECISION MAKING: Stable/uncomplicated   EVALUATION COMPLEXITY: Low     GOALS:     SHORT TERM GOALS: Target date: 11/17/2023       Pt will become independent with HEP in order to demonstrate synthesis of PT education.   Goal status: met   2. Pt will be able to demonstrate full hip AROM in order to demonstrate functional improvement in LE function for self-care and house hold duties.      Goal status: ongoing  3.  Pt will report at least 2 pt reduction on NPRS scale for pain in order to demonstrate functional improvement with household activity, self care, and ADL.    Goal status: met  4. Pt will report at least 2 pt reduction on NPRS scale for pain in order to demonstrate functional improvement with household activity, self care, and ADL.   Goal status: INITIAL   LONG TERM GOALS: Target date: 12/29/2023         Pt  will become independent with final HEP in order to demonstrate synthesis of PT education.   Goal status: INITIAL   2.  Pt will score >/= 71 on FOTO to demonstrate improvement in perceived L hip function.      Goal status:  INITIAL   3.  Pt will be able to demonstrate 20x 8" box step downs without form deviation or pain in order to demonstrate functional improvement in LE function for return to hopping/jumping.    Goal status: INITIAL   4.  Pt will be able to demonstrate 80% strength with HHD in order to demonstrate functional improvement and tolerance to return to running.    Goal status: INITIAL   5. Pt will be able to demonstrate ability to DL hop without pain in order to demonstrate functional improvement and tolerance to low level plyometric loading.     Goal status: INITIAL  6. Pt will be able to demonstrate/report ability to sit/stand/sleep for extended periods of time without pain in order to demonstrate functional improvement and tolerance to static positioning.   Goal status: INITIAL       PLAN:   PT FREQUENCY: 1-2x/week   PT DURATION: 12 weeks (combine with hip labral repair episode)   PLANNED INTERVENTIONS: Therapeutic exercises, Therapeutic activity, Neuromuscular re-education, Balance training, Gait training, Patient/Family education, Self Care, Joint mobilization, Joint manipulation, Stair training, Aquatic Therapy, Dry Needling, Electrical stimulation, Spinal manipulation, Spinal mobilization, Cryotherapy, Moist heat, scar mobilization, Splintting, Taping, Vasopneumatic device, Traction, Ultrasound, Ionotophoresis 4mg /ml Dexamethasone, Manual therapy, and Re-evaluation   PLAN FOR NEXT SESSION: hip arthroscopy protocol; ROM, muscle activation; STM and joint mobs PRN    Zebedee Iba, PT 02/16/2024, 10:33 AM [facility] at [facility] [address] [place], [place], [address] Phone: [phone]   Fax:  [phone]

## 2024-02-18 ENCOUNTER — Encounter (HOSPITAL_BASED_OUTPATIENT_CLINIC_OR_DEPARTMENT_OTHER): Payer: 59

## 2024-02-19 ENCOUNTER — Ambulatory Visit (HOSPITAL_BASED_OUTPATIENT_CLINIC_OR_DEPARTMENT_OTHER): Admitting: Orthopaedic Surgery

## 2024-02-19 ENCOUNTER — Encounter (HOSPITAL_BASED_OUTPATIENT_CLINIC_OR_DEPARTMENT_OTHER): Payer: Self-pay | Admitting: Orthopaedic Surgery

## 2024-02-19 DIAGNOSIS — M79605 Pain in left leg: Secondary | ICD-10-CM

## 2024-02-19 MED ORDER — LIDOCAINE HCL 1 % IJ SOLN
4.0000 mL | INTRAMUSCULAR | Status: AC | PRN
  Administered 2024-02-19: 4 mL

## 2024-02-19 MED ORDER — TRIAMCINOLONE ACETONIDE 40 MG/ML IJ SUSP
80.0000 mg | INTRAMUSCULAR | Status: AC | PRN
Start: 2024-02-19 — End: 2024-02-19
  Administered 2024-02-19: 80 mg via INTRA_ARTICULAR

## 2024-02-19 NOTE — Progress Notes (Signed)
 Post Operative Evaluation    Procedure/Date of Surgery: Left hip arthroscopy with labral repair and cam debridement 11/4  Presents today for follow-up of the right shoulder as well as the left hip.  Today is experiencing more pain about the anterior medial hamstring involving the left distal thigh.  PMH/PSH/Family History/Social History/Meds/Allergies:    Past Medical History:  Diagnosis Date  . Anxiety   . Blood transfusion without reported diagnosis 2010   During Chemo Treatments  . Hypothyroidism   . Leukemia, acute myeloid, in remission (HCC) 2010  . Thyroid disease    Past Surgical History:  Procedure Laterality Date  . CYSTECTOMY Left    Scrotum  . PORT-A-CATH REMOVAL    . WISDOM TOOTH EXTRACTION     Social History   Socioeconomic History  . Marital status: Married    Spouse name: Not on file  . Number of children: 0  . Years of education: college  . Highest education level: Bachelor's degree (e.g., BA, AB, BS)  Occupational History  . Occupation: Disabled  Tobacco Use  . Smoking status: Never  . Smokeless tobacco: Never  Vaping Use  . Vaping status: Never Used  Substance and Sexual Activity  . Alcohol use: Yes    Comment: "maybe one drink every two years"  . Drug use: No  . Sexual activity: Yes    Partners: Female  Other Topics Concern  . Not on file  Social History Narrative   Lives at home with his wife.   Left-handed.   No daily caffeine use.    Social Drivers of Health   Financial Resource Strain: Low Risk  (02/14/2023)   Overall Financial Resource Strain (CARDIA)   . Difficulty of Paying Living Expenses: Not hard at all  Food Insecurity: No Food Insecurity (02/14/2023)   Hunger Vital Sign   . Worried About Programme researcher, broadcasting/film/video in the Last Year: Never true   . Ran Out of Food in the Last Year: Never true  Transportation Needs: No Transportation Needs (02/14/2023)   PRAPARE - Transportation   . Lack of  Transportation (Medical): No   . Lack of Transportation (Non-Medical): No  Physical Activity: Insufficiently Active (02/14/2023)   Exercise Vital Sign   . Days of Exercise per Week: 3 days   . Minutes of Exercise per Session: 30 min  Stress: No Stress Concern Present (02/14/2023)   [employer] of Occupational Health - Occupational Stress Questionnaire   . Feeling of Stress : Only a little  Social Connections: Unknown (02/14/2023)   Social Connection and Isolation Panel [NHANES]   . Frequency of Communication with Friends and Family: Once a week   . Frequency of Social Gatherings with Friends and Family: Patient declined   . Attends Religious Services: Never   . Active Member of Clubs or Organizations: No   . Attends Banker Meetings: Not on file   . Marital Status: Married   Family History  Problem Relation Age of Onset  . Hypertension Mother   . Hypertension Father   . Multiple sclerosis Maternal Grandmother   . Dementia Maternal Grandmother   . Diabetes Maternal Grandfather   . Heart attack Maternal Grandfather 80  . Diabetes Paternal Grandfather   . Thyroid cancer Maternal Aunt   . Prostate cancer Neg Hx   .  Colon cancer Neg Hx    Allergies  Allergen Reactions  . Topamax [Topiramate] Other (See Comments)    Anger/irritable  . Trazodone And Nefazodone Nausea Only   Current Outpatient Medications  Medication Sig Dispense Refill  . acetaminophen (TYLENOL) 500 MG tablet Take 1,000 mg by mouth every 6 (six) hours as needed.    . diazepam (VALIUM) 5 MG tablet Take 1 tablet (5 mg total) by mouth every 12 (twelve) hours as needed for muscle spasms 30 tablet 0  . ibuprofen (ADVIL) 200 MG tablet Take 200-400 mg by mouth every 6 (six) hours as needed for moderate pain.    Marland Kitchen levothyroxine (SYNTHROID) 150 MCG tablet Take 1 tablet (150 mcg total) by mouth daily. 90 tablet 3  . Multiple Vitamin (MULTIVITAMIN WITH MINERALS) TABS tablet Take 1 tablet by mouth daily.      No current facility-administered medications for this visit.   No results found.  Review of Systems:   A ROS was performed including pertinent positives and negatives as documented in the HPI.   Musculoskeletal Exam:    There were no vitals taken for this visit.  Left hip with 30 degrees internal/external rotation of the hip without pain.  Active flexion of the left hip is to 90 degrees.  Mild Trendelenburg gait.  Walks with crutches.  Remainder of distal neurosensory exam is intact with 2+ dorsalis pedis pulse  Right shoulder with tenderness about the medial scapula without palpable clicking.  There is scapular winging as he goes into forward elevation.  Negative O'Brien.  Forward elevation is to 170 degrees with external rotation to 50 degrees  Imaging:      I personally reviewed and interpreted the radiographs.   Assessment:   Experiencing anterior as well as posterior hamstring tendon pain involving his left leg.  At this time I did recommend ultrasound-guided injection to the hamstring in order to hopefully get him some relief from this.  I will plan to see him back as needed.  He will consider further right shoulder arthroscopy with biceps tenodesis in the future no improvement with continued therapy Plan :    -Left knee ultrasound-guided injection provided after verbal consent obtained about the hamstring    Procedure Note  Patient: Zachary Little             Date of Birth: 1980-12-16           MRN: [id number]             Visit Date: 02/19/2024  Procedures: Visit Diagnoses: No diagnosis found.  Large Joint Inj: L knee on 02/19/2024 10:02 AM Indications: pain Details: 22 G 1.5 in needle, ultrasound-guided anterior approach  Arthrogram: No  Medications: 4 mL lidocaine 1 %; 80 mg triamcinolone acetonide 40 MG/ML Outcome: tolerated well, no immediate complications Procedure, treatment alternatives, risks and benefits explained, specific risks discussed. Consent  was given by the patient. Immediately prior to procedure a time out was called to verify the correct patient, procedure, equipment, support staff and site/side marked as required. Patient was prepped and draped in the usual sterile fashion.         I personally saw and evaluated the patient, and participated in the management and treatment plan.  Huel Cote, MD Attending Physician, Orthopedic Surgery  This document was dictated using Dragon voice recognition software. A reasonable attempt at proof reading has been made to minimize errors.

## 2024-02-20 ENCOUNTER — Other Ambulatory Visit (HOSPITAL_BASED_OUTPATIENT_CLINIC_OR_DEPARTMENT_OTHER): Payer: Self-pay

## 2024-02-20 ENCOUNTER — Encounter (HOSPITAL_BASED_OUTPATIENT_CLINIC_OR_DEPARTMENT_OTHER): Payer: 59 | Admitting: Orthopaedic Surgery

## 2024-02-23 ENCOUNTER — Ambulatory Visit (HOSPITAL_BASED_OUTPATIENT_CLINIC_OR_DEPARTMENT_OTHER): Payer: 59 | Admitting: Physical Therapy

## 2024-02-25 ENCOUNTER — Ambulatory Visit (HOSPITAL_BASED_OUTPATIENT_CLINIC_OR_DEPARTMENT_OTHER): Payer: 59 | Admitting: Physical Therapy

## 2024-03-01 ENCOUNTER — Ambulatory Visit (HOSPITAL_BASED_OUTPATIENT_CLINIC_OR_DEPARTMENT_OTHER): Payer: 59 | Attending: Orthopaedic Surgery | Admitting: Physical Therapy

## 2024-03-01 ENCOUNTER — Encounter (HOSPITAL_BASED_OUTPATIENT_CLINIC_OR_DEPARTMENT_OTHER): Payer: Self-pay | Admitting: Physical Therapy

## 2024-03-01 DIAGNOSIS — R262 Difficulty in walking, not elsewhere classified: Secondary | ICD-10-CM | POA: Insufficient documentation

## 2024-03-01 DIAGNOSIS — M25552 Pain in left hip: Secondary | ICD-10-CM | POA: Insufficient documentation

## 2024-03-01 DIAGNOSIS — M6281 Muscle weakness (generalized): Secondary | ICD-10-CM | POA: Diagnosis not present

## 2024-03-01 DIAGNOSIS — M25511 Pain in right shoulder: Secondary | ICD-10-CM | POA: Insufficient documentation

## 2024-03-01 DIAGNOSIS — G8929 Other chronic pain: Secondary | ICD-10-CM | POA: Diagnosis not present

## 2024-03-01 NOTE — Therapy (Addendum)
 OUTPATIENT PHYSICAL THERAPY SHOULDER TREATMENT      Patient Name: Zachary Little MRN: [id number] DOB:Dec 18, 1980, 43 y.o., male Today's Date: 03/01/2024  END OF SESSION:  PT End of Session - 03/01/24 1016     Visit Number 20    Number of Visits 26    Date for PT Re-Evaluation 03/07/24    Authorization Type [facility] [employer]    PT Start Time 0930    PT Stop Time 1014    PT Time Calculation (min) 44 min    Activity Tolerance Patient tolerated treatment well    Behavior During Therapy WFL for tasks assessed/performed                              Past Medical History:  Diagnosis Date   Anxiety    Blood transfusion without reported diagnosis 2010   During Chemo Treatments   Hypothyroidism    Leukemia, acute myeloid, in remission (HCC) 2010   Thyroid  disease    Past Surgical History:  Procedure Laterality Date   CYSTECTOMY Left    Scrotum   PORT-A-CATH REMOVAL     WISDOM TOOTH EXTRACTION     Patient Active Problem List   Diagnosis Date Noted   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   Chronic right hip pain 09/23/2023   Labral tear of left hip joint 10/01/2022   DDD (degenerative disc disease), cervical, 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 C6 foraminal stenosis with left-sided periscapular discomfort 04/23/2022   Pes anserinus bursitis of left knee 03/19/2022   Chronic pain syndrome 12/29/2019   Neuropathic pain of thigh, right 12/29/2019   Labral tear of shoulder, right, sequela 10/06/2019   Depression, major, single episode, 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   Erectile dysfunction 03/09/2018   Obesity 03/09/2018   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 Left hip labral repair Left hip CAM debridement  SUBJECTIVE:   SUBJECTIVE STATEMENT:  Pt reports having increase in medial thigh pain and has pain with abduction. Pt reports he has been easing up on the gym due to some R LE irritation. Pt reports walking/jogging still feels okay. Pt reports that the injection did not seem to improve pain/discomfort. Pt denies NT. Feels like dull ache into the lateral thigh. Pt reports neck/shoulder blade is improving. Pt pain is worst 4/10. Upper back pain 2/10 at worst.   Eval: Pt is here for the R should evaluation today. Pt notes that the R shoulder blade will hurt up to a 6/10 as it goes into adduction as he is sleeping. Unable to side sleep anymore. Pt states the the shoulder hurts with walking and it is creating pain from just sitting at his side. Pt did have an MRI that did show labral tear. Pt states that that shoulder pain has been ongoing off and on for about 5 years (beyond time of imaging) Jogging does not  increase pain.  Lifting weights did not cause pain. Denies NT.  Denies history of neck involvement/pain. Pt states that the pain will also feel it into the triceps. Feels similar aching sensation. Pt is L hand dominant. Has not noticed weakness into the R UE. No dropping of items. Movement during ADL cannot recreate pain.   Pt has had an MRI that shows C6 stenosis. He has been doing light HEP for the neck and shoulders: ROM  Eval:  Pt has been compliant with WB precautions. Pt has been resting and protecting the hip appropriately. The lateral thigh on the L is aching and painful on occasion. Pt has been using aspirin and icing as directed. Denies  NT. Denies s/s of infection. Pt states he has not been icing as much recently. Was not provided a brace post-op.   PERTINENT HISTORY: N/A PAIN:   PAIN:  Are you having pain? No none at rest but does come on with sleep and walking VAS scale: 0/10 Pain location:  R rhomboid area Pain description:aching  Aggravating factors: walking,cross body adduction Relieving factors: massage/theracane   Are you having pain? No: NPRS scale: 0/10 Pain location: L lateral Pain description: aching Aggravating factors: movement  Relieving factors: rest/ meds   PRECAUTIONS: Other: L hip labral repair  RED FLAGS: None   WEIGHT BEARING RESTRICTIONS: Yes TTWB for first two weeks  FALLS:  Has patient fallen in last 6 months? No  LIVING ENVIRONMENT: Lives with: lives with their family Lives in: House/apartment Stairs: 2nd story home Has following equipment at home: Crutches  OCCUPATION: N/A  Running- flat road with occasional trails/hills  PLOF: Independent  PATIENT GOALS: be able to run about 3 miles again; sleep on shoulder/reduce shoulder pain   OBJECTIVE:  Note: Objective measures were completed at Evaluation unless otherwise noted.  DIAGNOSTIC FINDINGS:   FINDINGS: Rotator cuff:  Intact without significant tendinosis.   Muscles:  No focal muscular atrophy or edema.   Biceps long head:  Intact and normally positioned.   Acromioclavicular Joint: Normal acromioclavicular joint. No significant fluid or contrast is present in the subacromial/subdeltoid bursa.   Glenohumeral Joint: Distended with intra-articular contrast. No chondral defect.   Labrum: Superior labral tear (series 5, image 10). Remaining labrum is intact.   Bones: No acute fracture or dislocation. No suspicious bone lesion.   Other: None.   IMPRESSION: 1. Superior labral tear.  IMPRESSION: 1. Broad-based posterior disc bulge with uncovertebral spurring at C5-6 with resultant moderate spinal stenosis,  with mild left C6 foraminal narrowing. 2. Additional mild noncompressive disc bulging at C4-5 and C6-7 without stenosis or impingement.   PT REPORTED OUTCOME MEASURES: LEFS Lower Extremity Functional Score: 74 / 80 = 92.5 % SPADI Total SPADI Score: 7 / 130 = 5.4 %  MMT in lbs  Right 1/13 R 4/7  Left 1/13 L 4/7  Shoulder flexion 39.6 48.8 p! 49.3 47.1  Shoulder extension      Shoulder abduction 34.5 41.6 37.0 43.5  Shoulder protaction 34.3 36.2 40.8 35.1  Shoulder adduction      Shoulder internal rotation 5/5  5/5   Shoulder external rotation 4+/5  4+/5   (Blank rows = not tested)   Shoulder AROM symmetrical and WFL bilat; no recreation of pain  MMT Right 3/10 R 4/7 Left 3/10 L 4/7  Hip flexion 80.6 73.5 74.5 61.4  Hip extension      Hip abduction 67.9 46.2 56.7 46.3  Hip adduction      Hip  internal rotation 37.2  28.2   Hip external rotation 30.4  28.8   Knee flexion      Knee extension 48.0  46.5   Ankle dorsiflexion      Ankle plantarflexion      Ankle inversion      Ankle eversion       (Blank rows = not tested)   CERVICAL ROM:   Active ROM A/PROM (deg) eval 4/7 %  Flexion 100% 100%  Extension 60% 75  Right lateral flexion 75% R p! 75p!  Left lateral flexion 75% 90  Right rotation 80% 90 p!  Left rotation 80% 90   (Blank rows = not tested)         TODAY'S TREATMENT:                                                                                                                              DATE:  4/7  Conservative management vs surgical intervention; current evidence on imaging results; timelines for conservative management; expectations with pain and progression; periodization of strengthening program; tissue loading tolerance  HEP progression, exam edu results  SL squat to table with YTB at knees 4x5 Adduction bridge with max isometric 3x8 3s Side plank 2x6 3s  3/24  Recumbent bike lvl 2 5 min   26lb KB farmers carry 4x weight room  laps Jefferson curl to box 26lbs KB 3x6 18" weighted box step up 3x8 10lbs each hand 20lb RDL 4x8 (back of leg on box to promote vertical shin angle)   Supramax eccentric SL squat to chair 4x6 10lb KB   3/17   Prone CPA  and UPA grade IV C7-T5 T1-5 Grade III rotational mob (on spinous process)  Supine piriformis 30s 3x Couch stretch 30s 4x Wall angel 2x10 SL leg press 3x8 55lbs (white machine)  SL knee ext 3x8 (white machine) 25lbs  SL HS curl 3x8 15lbs  3/10  Prone CPA  and UPA grade IV C7-T5 T1-5 Grade III rotational mob (on spinous process)  Seated cable row (wide grip) 3x8  DL hopping 54U 2x; SL hopping 30s  Skipping 41ft 2x Cat/cow 2x10      3/3 Recumbent bike L 3 x67min Prone H abduction 2# x20, 3# x20 Prone Low trap 3# 2x01 1/2 knee hip flexor stretch Standing quad stretch Squats x10 following stretches Lat pull downs 40lbs x10 (for observation of form for independent gym program) Triceps press 55lbs 2x10 Shoulder press (to observe form) 2-3lb dumbbells Discussion of gym program and HEP Reviewed form with HEP exercises      2/13 L hip PROM  IASTM- roller to L quads  Recumbent bike L 3 x64min Supine SLR 2x10ea Supine chin tuck 5" x10 Prone quad stretch with 1/2 roll under thigh Sidelying hip abduction 2# 3x10L Horizontal abduction RTB 2x15  Staggered eccentric STS 3x10 L posterior Reverse lunges at rail x 10 alternating Serratus raises RTB 2x10 Drivers RTB 9W11 Side  stepping with squat x 3 laps (1/2 hall) Blue TB at ankles 2x10   2/6 L hip PROM   Lateral step down 3x8 8"  RTB serratus punch in standing 3x10 Diagonals with RTB PNF D2 2x10 Horizontal abduction RTB 2x15 Bilateral ER RTB x15 (stopped due to lateral forearm discomfort) Staggered STS 2x10 L posterior Reverse lunges at rail x 10 alternating Half kneeling hip flexor 30s 3x Serratus raises RTB 2x10 Drivers RTB 1O10 Wall clocks x10 RTB Side stepping with squats x 3 laps  (1/2 hall) Blue TB at ankles 2x10    2/3 L hip mulligan mob grade III-IV inf and lateral  FABER slide 3s 15x Butterfly 30s 2x Lateral step down 3x8 8"  RTB serratus punch in standing 3x10 Diagonals with RTB 3x5 15lbs goblet squat 3x8  Half kneeling hip flexor 30s 3x   1/17 FOTO 80% STM to R upper traps Cupping to R UT  Bil ER in standing YTB 2x10 Serratus flexion with YTB x10 Supine thoracic ext over 1/2 roll x10 Supine horizontal abd RTB 2x10  Single leg bridge 3" 2x10 L LE Runner step up with heel raise 8" x20 Staggered sit to stands from elevated plinth 2x5 (very fatiguing) Standing hip abd RTB at ankles 2x10ea   1/13  T4-10 CPA grade IV C3-6 UPA on R grade III   Exercises - Thoracic Extension Mobilization on Foam Roll  - 1 x daily - 7 x weekly - 2 sets - 10 reps - 2s hold - Shoulder External Rotation and Scapular Retraction with Resistance  - 3-4 x daily - 7 x weekly - 1 sets - 10 reps   1/9  Recumbent bike Lvl 1 6 min  STM to proximal lateral quad TPR to R periscapular mm  Seated scap retraction 5" x20  Single leg bridge 5" 2x10 L LE Supine SLR 2x10 Sidelying hip abduction 3x10 Runner step up 8" x30L Static lunges at rail 2x10ea  Squats 2x10 with 3 seconds hold, seconds set (only 9 reps with second set, pt fatigued and lost balance, falling to ground from squat position. Did not hurt self and able to stand up quickly and independently.  Able to stand independently following this.) SLS on airex with alternating lateral reaches x10ea LAQ 5# 5" 2x10   PATIENT EDUCATION:  Education details: anatomy, exercise progression, DOMS expectations, muscle firing,  envelope of function, HEP, POC Person educated: Patient Education method: Explanation, Demonstration, Tactile cues, Verbal cues, and Handouts Education comprehension: verbalized understanding, returned demonstration, verbal cues required, and tactile cues required   HOME EXERCISE PROGRAM:   Access  Code: [email or web address] (prefers print) URL: [email or web address] Date: 10/06/2023 Prepared by: Silver Dross    Access Code: [email or web address] (shoulder; texted to pt)  URL: [email or web address] Date: 12/08/2023 Prepared by: Silver Dross    ASSESSMENT:   CLINICAL IMPRESSION: Patient with improvement in right upper quarter pain and symptoms with conservative management.  Patient does have increased pain at the lateral thigh and recently at the medial thigh that is likely due to recent increase in activity and generalized lumbopelvic sensitivity. HHD strength testing shows hip flexion is still limited on L vs R. Subjective outcome measures show improved tolerance to activity at the LE as well as upper quarter. Patient was able to progress to more single-leg control exercise today for home exercise program as well as shoulder stability and close chain.  Plan for patient to decrease frequency of therapy visits as patient will begin to use home exercise and independent management  to progress strength. Pt would benefit from continued skilled therapy in order to reach goals and maximize functional strength and ROM for return to normalized community mobility and exercise.     OBJECTIVE IMPAIRMENTS: decreased activity tolerance, decreased balance, decreased endurance, decreased mobility, decreased ROM, decreased strength, hypomobility, increased muscle spasms, impaired flexibility, improper body mechanics, postural dysfunction, and pain.    ACTIVITY LIMITATIONS: lifting, squatting, locomotion level, and dressing   PARTICIPATION LIMITATIONS: interpersonal relationship, community activity, and exercise   PERSONAL FACTORS: Past/current experiences and Time since onset of injury/illness/exacerbation are also affecting patient's functional outcome.    REHAB POTENTIAL: Good   CLINICAL DECISION MAKING: Stable/uncomplicated   EVALUATION COMPLEXITY: Low     GOALS:     SHORT TERM GOALS:  Target date: 11/17/2023       Pt will become independent with HEP in order to demonstrate synthesis of PT education.   Goal status: met   2. Pt will be able to demonstrate full hip AROM in order to demonstrate functional improvement in LE function for self-care and house hold duties.      Goal status: ongoing   3.  Pt will report at least 2 pt reduction on NPRS scale for pain in order to demonstrate functional improvement with household activity, self care, and ADL.    Goal status: met  4. Pt will report at least 2 pt reduction on NPRS scale for pain in order to demonstrate functional improvement with household activity, self care, and ADL.   Goal status: MET   LONG TERM GOALS: Target date: 05/30/2024          Pt  will become independent with final HEP in order to demonstrate synthesis of PT education.   Goal status: ongoing   2.  Pt will score >/= 71 on FOTO to demonstrate improvement in perceived L hip function.      Goal status: N/A   3.  Pt will be able to demonstrate 20x 8" box step downs without form deviation or pain in order to demonstrate functional improvement in LE function for return to hopping/jumping.    Goal status: ongoing   4.  Pt will be able to demonstrate 80% strength with HHD in order to demonstrate functional improvement and tolerance to return to running.    Goal status: MET   5. Pt will be able to demonstrate ability to DL hop without pain in order to demonstrate functional improvement and tolerance to low level plyometric loading.     Goal status: ongoing  6. Pt will be able to demonstrate/report ability to sit/stand/sleep for extended periods of time without pain in order to demonstrate functional improvement and tolerance to static positioning.   Goal status: ongoing       PLAN:   PT FREQUENCY: 1x/week to 1x/every 2-3 wks   PT DURATION: 12 weeks (combine with hip labral repair episode)   PLANNED INTERVENTIONS: Therapeutic exercises,  Therapeutic activity, Neuromuscular re-education, Balance training, Gait training, Patient/Family education, Self Care, Joint mobilization, Joint manipulation, Stair training, Aquatic Therapy, Dry Needling, Electrical stimulation, Spinal manipulation, Spinal mobilization, Cryotherapy, Moist heat, scar mobilization, Splintting, Taping, Vasopneumatic device, Traction, Ultrasound, Ionotophoresis 4mg /ml Dexamethasone , Manual therapy, and Re-evaluation   PLAN FOR NEXT SESSION: hip arthroscopy protocol; ROM, muscle activation; STM and joint mobs PRN    Silver Dross, PT 03/01/2024, 10:17 AM [facility] [address] [place], [place], [address] Phone: [phone]   Fax:  [phone]

## 2024-03-03 ENCOUNTER — Encounter (HOSPITAL_BASED_OUTPATIENT_CLINIC_OR_DEPARTMENT_OTHER): Payer: 59 | Admitting: Physical Therapy

## 2024-03-10 ENCOUNTER — Encounter (HOSPITAL_BASED_OUTPATIENT_CLINIC_OR_DEPARTMENT_OTHER)

## 2024-03-15 ENCOUNTER — Ambulatory Visit (HOSPITAL_BASED_OUTPATIENT_CLINIC_OR_DEPARTMENT_OTHER): Admitting: Physical Therapy

## 2024-03-22 ENCOUNTER — Encounter (HOSPITAL_BASED_OUTPATIENT_CLINIC_OR_DEPARTMENT_OTHER): Admitting: Physical Therapy

## 2024-03-29 ENCOUNTER — Ambulatory Visit (HOSPITAL_BASED_OUTPATIENT_CLINIC_OR_DEPARTMENT_OTHER): Attending: Orthopaedic Surgery | Admitting: Physical Therapy

## 2024-03-29 ENCOUNTER — Encounter (HOSPITAL_BASED_OUTPATIENT_CLINIC_OR_DEPARTMENT_OTHER): Payer: Self-pay | Admitting: Physical Therapy

## 2024-03-29 DIAGNOSIS — R262 Difficulty in walking, not elsewhere classified: Secondary | ICD-10-CM | POA: Diagnosis not present

## 2024-03-29 DIAGNOSIS — M25552 Pain in left hip: Secondary | ICD-10-CM | POA: Diagnosis not present

## 2024-03-29 DIAGNOSIS — M6281 Muscle weakness (generalized): Secondary | ICD-10-CM | POA: Insufficient documentation

## 2024-03-29 DIAGNOSIS — M25511 Pain in right shoulder: Secondary | ICD-10-CM | POA: Diagnosis not present

## 2024-03-29 DIAGNOSIS — G8929 Other chronic pain: Secondary | ICD-10-CM | POA: Diagnosis not present

## 2024-03-29 NOTE — Addendum Note (Signed)
 Addended by: Kaveri Perras on: 03/29/2024 09:55 AM   Modules accepted: Orders

## 2024-03-29 NOTE — Therapy (Addendum)
 OUTPATIENT PHYSICAL THERAPY SHOULDER TREATMENT  PHYSICAL THERAPY DISCHARGE SUMMARY  Visits from Start of Care: 21  Plan: Patient agrees to discharge.  Patient goals were not met. Patient is being discharged due to not returning to therapy.          Patient Name: Zachary Little MRN: [id number] DOB:01/30/1981, 43 y.o., male Today's Date: 03/29/2024  END OF SESSION:  PT End of Session - 03/29/24 1012     Visit Number 21    Number of Visits 30    Date for PT Re-Evaluation 05/30/24    Authorization Type [facility] [employer]    PT Start Time 1015    PT Stop Time 1044    PT Time Calculation (min) 29 min    Activity Tolerance Patient tolerated treatment well    Behavior During Therapy WFL for tasks assessed/performed                              Past Medical History:  Diagnosis Date   Anxiety    Blood transfusion without reported diagnosis 2010   During Chemo Treatments   Hypothyroidism    Leukemia, acute myeloid, in remission (HCC) 2010   Thyroid  disease    Past Surgical History:  Procedure Laterality Date   CYSTECTOMY Left    Scrotum   PORT-A-CATH REMOVAL     WISDOM TOOTH EXTRACTION     Patient Active Problem List   Diagnosis Date Noted   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   Chronic right hip pain 09/23/2023   Labral tear of left hip joint 10/01/2022   DDD (degenerative disc disease), cervical, 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 C6 foraminal stenosis with left-sided periscapular discomfort 04/23/2022   Pes anserinus bursitis of left knee 03/19/2022   Chronic pain syndrome 12/29/2019   Neuropathic pain of thigh, right 12/29/2019   Labral tear of shoulder, right, sequela 10/06/2019   Depression, major, single episode, 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   Erectile dysfunction 03/09/2018   Obesity 03/09/2018   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/02/2018   Knee pain 10/31/2017   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 Left hip labral repair Left hip CAM debridement  SUBJECTIVE:   SUBJECTIVE STATEMENT:  Pt reports since last session pt had increase in pain with step up. He was unable to do anything on it for 2 wks. Pt is back to walking again but still has issues with uphill. Pt states a quad stretch with abduction or even seated abduction will give him anterior thigh/quad pain. Feels like a bad ache. Pt states the neck is about the same and improving.   Pt has a visit schedule with [facility] at beginning of June to have my neck and spine checked.   Eval: Pt is here for the R should evaluation today. Pt notes that the R shoulder blade will hurt up to a 6/10 as it goes into adduction as he is sleeping. Unable to side sleep anymore.  Pt states the the shoulder hurts with walking and it is creating pain from just sitting at his side. Pt did have an MRI that did show labral tear. Pt states that that shoulder pain has been ongoing off and on for about 5 years (beyond time of imaging) Jogging does not increase pain.  Lifting weights did not cause pain. Denies NT.  Denies history of neck involvement/pain. Pt states that the pain will also feel it into the triceps. Feels similar aching sensation. Pt is L hand dominant. Has not noticed weakness into the R UE. No dropping of items. Movement during ADL cannot recreate pain.   Pt has had an MRI that shows C6 stenosis. He has been doing light HEP for the neck and shoulders:  ROM  Eval:  Pt has been compliant with WB precautions. Pt has been resting and protecting the hip appropriately. The lateral thigh on the L is aching and painful on occasion. Pt has been using aspirin and icing as directed. Denies NT. Denies s/s of infection. Pt states he has not been icing as much recently. Was not provided a brace post-op.   PERTINENT HISTORY: N/A PAIN:   PAIN:  Are you having pain? No none at rest but does come on with sleep and walking VAS scale: 0/10 Pain location:  R rhomboid area Pain description:aching  Aggravating factors: walking,cross body adduction Relieving factors: massage/theracane   Are you having pain? No: NPRS scale: 0/10 Pain location: L lateral Pain description: aching Aggravating factors: movement  Relieving factors: rest/ meds   PRECAUTIONS: Other: L hip labral repair  RED FLAGS: None   WEIGHT BEARING RESTRICTIONS: Yes TTWB for first two weeks  FALLS:  Has patient fallen in last 6 months? No  LIVING ENVIRONMENT: Lives with: lives with their family Lives in: House/apartment Stairs: 2nd story home Has following equipment at home: Crutches  OCCUPATION: N/A  Running- flat road with occasional trails/hills  PLOF: Independent  PATIENT GOALS: be able to run about 3 miles again; sleep on shoulder/reduce shoulder pain   OBJECTIVE:  Note: Objective measures were completed at Evaluation unless otherwise noted.  DIAGNOSTIC FINDINGS:   FINDINGS: Rotator cuff:  Intact without significant tendinosis.   Muscles:  No focal muscular atrophy or edema.   Biceps long head:  Intact and normally positioned.   Acromioclavicular Joint: Normal acromioclavicular joint. No significant fluid or contrast is present in the subacromial/subdeltoid bursa.   Glenohumeral Joint: Distended with intra-articular contrast. No chondral defect.   Labrum: Superior labral tear (series 5, image 10). Remaining labrum is intact.   Bones: No acute fracture  or dislocation. No suspicious bone lesion.   Other: None.   IMPRESSION: 1. Superior labral tear.  IMPRESSION: 1. Broad-based posterior disc bulge with uncovertebral spurring at C5-6 with resultant moderate spinal stenosis, with mild left C6 foraminal narrowing. 2. Additional mild noncompressive disc bulging at C4-5 and C6-7 without stenosis or impingement.   PT REPORTED OUTCOME MEASURES: LEFS Lower Extremity Functional Score: 74 / 80 = 92.5 % SPADI Total SPADI Score: 7 / 130 = 5.4 %  MMT in lbs  Right 1/13 R 4/7  Left 1/13 L 4/7  Shoulder flexion 39.6 48.8 p! 49.3 47.1  Shoulder extension      Shoulder abduction 34.5 41.6 37.0 43.5  Shoulder protaction 34.3 36.2 40.8 35.1  Shoulder adduction      Shoulder internal rotation 5/5  5/5   Shoulder external rotation 4+/5  4+/5   (Blank rows = not tested)  Shoulder AROM symmetrical and WFL bilat; no recreation of pain  MMT Right 3/10 R 4/7 Left 3/10 L 4/7  Hip flexion 80.6 73.5 74.5 61.4  Hip extension      Hip abduction 67.9 46.2 56.7 46.3  Hip adduction      Hip internal rotation 37.2  28.2   Hip external rotation 30.4  28.8   Knee flexion      Knee extension 48.0  46.5   Ankle dorsiflexion      Ankle plantarflexion      Ankle inversion      Ankle eversion       (Blank rows = not tested)   CERVICAL ROM:   Active ROM A/PROM (deg) eval 4/7 %  Flexion 100% 100%  Extension 60% 75  Right lateral flexion 75% R p! 75p!  Left lateral flexion 75% 90  Right rotation 80% 90 p!  Left rotation 80% 90   (Blank rows = not tested)         TODAY'S TREATMENT:                                                                                                                              DATE:  5/5  Special testing: positive femoral bend stress test in seated passive and supine active 90/90  Effects of exercise on thigh pain, MOI/agg factors and implications of neuro vs MSK vs vascular vs bony, testing results; diff  dx list and HEP modifications  4/7  Conservative management vs surgical intervention; current evidence on imaging results; timelines for conservative management; expectations with pain and progression; periodization of strengthening program; tissue loading tolerance  HEP progression, exam edu results  SL squat to table with YTB at knees 4x5 Adduction bridge with max isometric 3x8 3s Side plank 2x6 3s  3/24  Recumbent bike lvl 2 5 min   26lb KB farmers carry 4x weight room laps Jefferson curl to box 26lbs KB 3x6 18 weighted box step up 3x8 10lbs each hand 20lb RDL 4x8 (back of leg on box to promote vertical shin angle)   Supramax eccentric SL squat to chair 4x6 10lb KB   3/17  Prone CPA  and UPA grade IV C7-T5 T1-5 Grade III rotational mob (on spinous process)  Supine piriformis 30s 3x Couch stretch 30s 4x Wall angel 2x10 SL leg press 3x8 55lbs (white machine)  SL knee ext 3x8 (white machine) 25lbs  SL HS curl 3x8 15lbs  3/10  Prone CPA  and UPA grade IV C7-T5 T1-5 Grade III rotational mob (on spinous process)  Seated cable row (wide grip) 3x8  DL hopping 69d 2x; SL hopping 30s  Skipping 37ft 2x Cat/cow 2x10      3/3 Recumbent bike L 3 x38min Prone H abduction 2# x20, 3# x20 Prone Low trap 3# 2x01 1/2 knee hip flexor stretch Standing quad stretch Squats x10 following stretches Lat pull downs 40lbs x10 (for observation of form for  independent gym program) Triceps press 55lbs 2x10 Shoulder press (to observe form) 2-3lb dumbbells Discussion of gym program and HEP Reviewed form with HEP exercises   2/13 L hip PROM  IASTM- roller to L quads  Recumbent bike L 3 x37min Supine SLR 2x10ea Supine chin tuck 5 x10 Prone quad stretch with 1/2 roll under thigh Sidelying hip abduction 2# 3x10L Horizontal abduction RTB 2x15  Staggered eccentric STS 3x10 L posterior Reverse lunges at rail x 10 alternating Serratus raises RTB 2x10 Drivers RTB 7k89 Side  stepping with squat x 3 laps (1/2 hall) Blue TB at ankles 2x10   2/6 L hip PROM   Lateral step down 3x8 8  RTB serratus punch in standing 3x10 Diagonals with RTB PNF D2 2x10 Horizontal abduction RTB 2x15 Bilateral ER RTB x15 (stopped due to lateral forearm discomfort) Staggered STS 2x10 L posterior Reverse lunges at rail x 10 alternating Half kneeling hip flexor 30s 3x Serratus raises RTB 2x10 Drivers RTB 7k89 Wall clocks x10 RTB Side stepping with squats x 3 laps (1/2 hall) Blue TB at ankles 2x10    2/3 L hip mulligan mob grade III-IV inf and lateral  FABER slide 3s 15x Butterfly 30s 2x Lateral step down 3x8 8  RTB serratus punch in standing 3x10 Diagonals with RTB 3x5 15lbs goblet squat 3x8  Half kneeling hip flexor 30s 3x   1/17 FOTO 80% STM to R upper traps Cupping to R UT  Bil ER in standing YTB 2x10 Serratus flexion with YTB x10 Supine thoracic ext over 1/2 roll x10 Supine horizontal abd RTB 2x10  Single leg bridge 3 2x10 L LE Runner step up with heel raise 8 x20 Staggered sit to stands from elevated plinth 2x5 (very fatiguing) Standing hip abd RTB at ankles 2x10ea   1/13  T4-10 CPA grade IV C3-6 UPA on R grade III   Exercises - Thoracic Extension Mobilization on Foam Roll  - 1 x daily - 7 x weekly - 2 sets - 10 reps - 2s hold - Shoulder External Rotation and Scapular Retraction with Resistance  - 3-4 x daily - 7 x weekly - 1 sets - 10 reps   1/9  Recumbent bike Lvl 1 6 min  STM to proximal lateral quad TPR to R periscapular mm  Seated scap retraction 5 x20  Single leg bridge 5 2x10 L LE Supine SLR 2x10 Sidelying hip abduction 3x10 Runner step up 8 x30L Static lunges at rail 2x10ea  Squats 2x10 with 3 seconds hold, seconds set (only 9 reps with second set, pt fatigued and lost balance, falling to ground from squat position. Did not hurt self and able to stand up quickly and independently.  Able to stand independently following  this.) SLS on airex with alternating lateral reaches x10ea LAQ 5# 5 2x10   PATIENT EDUCATION:  Education details: anatomy, exercise progression, DOMS expectations, muscle firing,  envelope of function, HEP, POC Person educated: Patient Education method: Explanation, Demonstration, Tactile cues, Verbal cues, and Handouts Education comprehension: verbalized understanding, returned demonstration, verbal cues required, and tactile cues required   HOME EXERCISE PROGRAM:   Access Code: [email or web address] (prefers print) URL: [email or web address] Date: 10/06/2023 Prepared by: Dale Call    Access Code: [email or web address] (shoulder; texted to pt)  URL: [email or web address] Date: 12/08/2023 Prepared by: Dale Call    ASSESSMENT:   CLINICAL IMPRESSION: Pt s/s present today that appears to be bony in nature. Chart review does not show diagnostics of the mid to distal thigh. Pt report  of symptoms sound to stress reaction in nature. Pt denies red flag cancer questions but does have history of leukemia. Pt advised to see physican/PA for further testing. Pt also has appt with [facility] to try and rule out lumbar radiculopathy/spinal involvement, though this sounds unlikely. Plan for pt to seek further testing prior to returning to PT.     OBJECTIVE IMPAIRMENTS: decreased activity tolerance, decreased balance, decreased endurance, decreased mobility, decreased ROM, decreased strength, hypomobility, increased muscle spasms, impaired flexibility, improper body mechanics, postural dysfunction, and pain.    ACTIVITY LIMITATIONS: lifting, squatting, locomotion level, and dressing   PARTICIPATION LIMITATIONS: interpersonal relationship, community activity, and exercise   PERSONAL FACTORS: Past/current experiences and Time since onset of injury/illness/exacerbation are also affecting patient's functional outcome.    REHAB POTENTIAL: Good   CLINICAL DECISION MAKING: Stable/uncomplicated    EVALUATION COMPLEXITY: Low     GOALS:     SHORT TERM GOALS: Target date: 11/17/2023       Pt will become independent with HEP in order to demonstrate synthesis of PT education.   Goal status: met   2. Pt will be able to demonstrate full hip AROM in order to demonstrate functional improvement in LE function for self-care and house hold duties.      Goal status: ongoing   3.  Pt will report at least 2 pt reduction on NPRS scale for pain in order to demonstrate functional improvement with household activity, self care, and ADL.    Goal status: met  4. Pt will report at least 2 pt reduction on NPRS scale for pain in order to demonstrate functional improvement with household activity, self care, and ADL.   Goal status: MET   LONG TERM GOALS: Target date: 05/30/2024          Pt  will become independent with final HEP in order to demonstrate synthesis of PT education.   Goal status: ongoing   2.  Pt will score >/= 71 on FOTO to demonstrate improvement in perceived L hip function.      Goal status: N/A   3.  Pt will be able to demonstrate 20x 8 box step downs without form deviation or pain in order to demonstrate functional improvement in LE function for return to hopping/jumping.    Goal status: ongoing   4.  Pt will be able to demonstrate 80% strength with HHD in order to demonstrate functional improvement and tolerance to return to running.    Goal status: MET   5. Pt will be able to demonstrate ability to DL hop without pain in order to demonstrate functional improvement and tolerance to low level plyometric loading.     Goal status: ongoing  6. Pt will be able to demonstrate/report ability to sit/stand/sleep for extended periods of time without pain in order to demonstrate functional improvement and tolerance to static positioning.   Goal status: ongoing       PLAN:   PT FREQUENCY: 1x/week to 1x/every 2-3 wks   PT DURATION: 12 weeks (combine with hip  labral repair episode)   PLANNED INTERVENTIONS: Therapeutic exercises, Therapeutic activity, Neuromuscular re-education, Balance training, Gait training, Patient/Family education, Self Care, Joint mobilization, Joint manipulation, Stair training, Aquatic Therapy, Dry Needling, Electrical stimulation, Spinal manipulation, Spinal mobilization, Cryotherapy, Moist heat, scar mobilization, Splintting, Taping, Vasopneumatic device, Traction, Ultrasound, Ionotophoresis 4mg /ml Dexamethasone , Manual therapy, and Re-evaluation   PLAN FOR NEXT SESSION: hip arthroscopy protocol; ROM, muscle activation; STM and joint mobs PRN    Dale Call, PT  03/29/2024, 10:55 AM [facility] [address] [place], [place], [address] Phone: [phone]   Fax:  [phone]

## 2024-03-30 ENCOUNTER — Encounter (HOSPITAL_BASED_OUTPATIENT_CLINIC_OR_DEPARTMENT_OTHER): Payer: Self-pay

## 2024-03-30 ENCOUNTER — Encounter (HOSPITAL_BASED_OUTPATIENT_CLINIC_OR_DEPARTMENT_OTHER): Payer: Self-pay | Admitting: Student

## 2024-03-30 ENCOUNTER — Ambulatory Visit (HOSPITAL_BASED_OUTPATIENT_CLINIC_OR_DEPARTMENT_OTHER): Admitting: Student

## 2024-03-30 ENCOUNTER — Ambulatory Visit (HOSPITAL_BASED_OUTPATIENT_CLINIC_OR_DEPARTMENT_OTHER)

## 2024-03-30 DIAGNOSIS — M79605 Pain in left leg: Secondary | ICD-10-CM | POA: Diagnosis not present

## 2024-03-30 DIAGNOSIS — M25552 Pain in left hip: Secondary | ICD-10-CM | POA: Diagnosis not present

## 2024-03-30 NOTE — Progress Notes (Signed)
 Post Operative Evaluation    Procedure/Date of Surgery: Left hip arthroscopy with labral repair and cam debridement 11/4  Patient presents today with chief complaint of ongoing left thigh pain.  States that this pain has been ongoing for about a year, and denies any previous injury.  Pain is located deep within the thigh, and is worsened after periods of running or with deep pressure.  He has been walking more over the past few weeks which could have increased his symptoms as well.  Has tried icing and ibuprofen without significant relief.  Did undergo chemotherapy back in 2010 for acute leukemia.  Currently working with physical therapy and has noted increased discomfort with quad exercises and stretches.  PMH/PSH/Family History/Social History/Meds/Allergies:    Past Medical History:  Diagnosis Date   Anxiety    Blood transfusion without reported diagnosis 2010   During Chemo Treatments   Hypothyroidism    Leukemia, acute myeloid, in remission (HCC) 2010   Thyroid  disease    Past Surgical History:  Procedure Laterality Date   CYSTECTOMY Left    Scrotum   PORT-A-CATH REMOVAL     WISDOM TOOTH EXTRACTION     Social History   Socioeconomic History   Marital status: Married    Spouse name: Not on file   Number of children: 0   Years of education: college   Highest education level: Bachelor's degree (e.g., BA, AB, BS)  Occupational History   Occupation: Disabled  Tobacco Use   Smoking status: Never   Smokeless tobacco: Never  Vaping Use   Vaping status: Never Used  Substance and Sexual Activity   Alcohol use: Yes    Comment: "maybe one drink every two years"   Drug use: No   Sexual activity: Yes    Partners: Female  Other Topics Concern   Not on file  Social History Narrative   Lives at home with his wife.   Left-handed.   No daily caffeine use.    Social Drivers of Corporate investment banker Strain: Low Risk  (02/14/2023)    Overall Financial Resource Strain (CARDIA)    Difficulty of Paying Living Expenses: Not hard at all  Food Insecurity: No Food Insecurity (02/14/2023)   Hunger Vital Sign    Worried About Running Out of Food in the Last Year: Never true    Ran Out of Food in the Last Year: Never true  Transportation Needs: No Transportation Needs (02/14/2023)   PRAPARE - Administrator, Civil Service (Medical): No    Lack of Transportation (Non-Medical): No  Physical Activity: Insufficiently Active (02/14/2023)   Exercise Vital Sign    Days of Exercise per Week: 3 days    Minutes of Exercise per Session: 30 min  Stress: No Stress Concern Present (02/14/2023)   [employer] of Occupational Health - Occupational Stress Questionnaire    Feeling of Stress : Only a little  Social Connections: Unknown (02/14/2023)   Social Connection and Isolation Panel [NHANES]    Frequency of Communication with Friends and Family: Once a week    Frequency of Social Gatherings with Friends and Family: Patient declined    Attends Religious Services: Never    Database administrator or Organizations: No    Attends Engineer, structural: Not on file  Marital Status: Married   Family History  Problem Relation Age of Onset   Hypertension Mother    Hypertension Father    Multiple sclerosis Maternal Grandmother    Dementia Maternal Grandmother    Diabetes Maternal Grandfather    Heart attack Maternal Grandfather 46   Diabetes Paternal Grandfather    Thyroid  cancer Maternal Aunt    Prostate cancer Neg Hx    Colon cancer Neg Hx    Allergies  Allergen Reactions   Topamax  [Topiramate ] Other (See Comments)    Anger/irritable   Trazodone  And Nefazodone Nausea Only   Current Outpatient Medications  Medication Sig Dispense Refill   acetaminophen  (TYLENOL ) 500 MG tablet Take 1,000 mg by mouth every 6 (six) hours as needed.     diazepam  (VALIUM ) 5 MG tablet Take 1 tablet (5 mg total) by mouth every 12  (twelve) hours as needed for muscle spasms 30 tablet 0   ibuprofen (ADVIL) 200 MG tablet Take 200-400 mg by mouth every 6 (six) hours as needed for moderate pain.     levothyroxine  (SYNTHROID ) 150 MCG tablet Take 1 tablet (150 mcg total) by mouth daily. 90 tablet 3   Multiple Vitamin (MULTIVITAMIN WITH MINERALS) TABS tablet Take 1 tablet by mouth daily.     No current facility-administered medications for this visit.   No results found.  Review of Systems:   A ROS was performed including pertinent positives and negatives as documented in the HPI.   Musculoskeletal Exam:    There were no vitals taken for this visit.  Tenderness with deep palpation about the left femur.  Left hip passive ROM to 120 degrees flexion, 30 degrees ER, and IR to 10 degrees.  Mild leg discomfort with hip internal rotation.  Active knee ROM from 0 to 130 degrees with 5/5 extension and flexion strength.  Distal neurosensory exam intact.  Imaging:   Xray (left femur 2 views): No evidence of bony abnormality   I personally reviewed and interpreted the radiographs.   Assessment:   Patient has been experiencing 1 year history of deep left thigh pain that has been slightly worsening as of recent.  He has been working with physical therapy, although pain has been disrupting his ability to progress.  Given this as well as his history of AML and chemotherapy, I would like to obtain an MRI of the left thigh for further investigation into the cause of his ongoing symptoms.  X-rays today did not reveal any evidence of a bony cause, but still unable to rule out a potential stress fracture.  Will plan to have him follow-up shortly after MRI for review and treatment discussion.  Plan :    - Obtain MRI of the left femur and return to clinic for review      I personally saw and evaluated the patient, and participated in the management and treatment plan.   Sharrell Deck, PA-C Orthopedics

## 2024-03-31 NOTE — Telephone Encounter (Signed)
 Order has been changed

## 2024-04-03 ENCOUNTER — Ambulatory Visit

## 2024-04-03 DIAGNOSIS — M79605 Pain in left leg: Secondary | ICD-10-CM | POA: Diagnosis not present

## 2024-04-12 ENCOUNTER — Encounter (HOSPITAL_BASED_OUTPATIENT_CLINIC_OR_DEPARTMENT_OTHER): Admitting: Physical Therapy

## 2024-04-26 ENCOUNTER — Encounter (HOSPITAL_BASED_OUTPATIENT_CLINIC_OR_DEPARTMENT_OTHER): Admitting: Physical Therapy

## 2024-05-18 DIAGNOSIS — R202 Paresthesia of skin: Secondary | ICD-10-CM | POA: Diagnosis not present

## 2024-05-18 DIAGNOSIS — M5441 Lumbago with sciatica, right side: Secondary | ICD-10-CM | POA: Diagnosis not present

## 2024-05-18 DIAGNOSIS — G8929 Other chronic pain: Secondary | ICD-10-CM | POA: Diagnosis not present

## 2024-05-18 DIAGNOSIS — M549 Dorsalgia, unspecified: Secondary | ICD-10-CM | POA: Diagnosis not present

## 2024-05-18 DIAGNOSIS — G894 Chronic pain syndrome: Secondary | ICD-10-CM | POA: Diagnosis not present

## 2024-05-18 DIAGNOSIS — M542 Cervicalgia: Secondary | ICD-10-CM | POA: Diagnosis not present

## 2024-05-18 DIAGNOSIS — M5442 Lumbago with sciatica, left side: Secondary | ICD-10-CM | POA: Diagnosis not present

## 2024-05-19 ENCOUNTER — Encounter: Payer: Self-pay | Admitting: Physician Assistant

## 2024-05-19 ENCOUNTER — Ambulatory Visit (INDEPENDENT_AMBULATORY_CARE_PROVIDER_SITE_OTHER): Admitting: Physician Assistant

## 2024-05-19 ENCOUNTER — Other Ambulatory Visit (HOSPITAL_BASED_OUTPATIENT_CLINIC_OR_DEPARTMENT_OTHER): Payer: Self-pay

## 2024-05-19 VITALS — BP 100/64 | HR 90 | Temp 97.3°F | Ht 68.0 in | Wt 191.2 lb

## 2024-05-19 DIAGNOSIS — L819 Disorder of pigmentation, unspecified: Secondary | ICD-10-CM | POA: Diagnosis not present

## 2024-05-19 DIAGNOSIS — G8929 Other chronic pain: Secondary | ICD-10-CM

## 2024-05-19 DIAGNOSIS — M25551 Pain in right hip: Secondary | ICD-10-CM

## 2024-05-19 DIAGNOSIS — R229 Localized swelling, mass and lump, unspecified: Secondary | ICD-10-CM | POA: Diagnosis not present

## 2024-05-19 DIAGNOSIS — G4700 Insomnia, unspecified: Secondary | ICD-10-CM

## 2024-05-19 MED ORDER — HYDROXYZINE HCL 25 MG PO TABS
12.5000 mg | ORAL_TABLET | Freq: Every day | ORAL | 0 refills | Status: DC
  Filled 2024-05-19: qty 30, 30d supply, fill #0

## 2024-05-19 MED ORDER — METRONIDAZOLE 0.75 % EX GEL
1.0000 | Freq: Two times a day (BID) | CUTANEOUS | 0 refills | Status: DC
  Filled 2024-05-19: qty 45, 23d supply, fill #0

## 2024-05-19 NOTE — Patient Instructions (Signed)
 It was great to see you!  Please call dermatology and let me know if they can you get in within a month or so  Trial 1/2 to full tablet of the hydroxyzine to see if it helps you sleep  Go to radiology when you pick up your prescription and show them your orders -- let me know what they say  Take care,  Lucie Buttner PA-C

## 2024-05-19 NOTE — Progress Notes (Signed)
 Zachary Little is a 43 y.o. male here for a new problem.  History of Present Illness:   Chief Complaint  Patient presents with   Mass    Pt c/o bump next to left eye, felt it a few days ago.    HPI  Mass Pt complains of a bump next to his left eye starting a few days ago.  Pt reports soreness when he first noticed. He also reports slight tenderness, pain, and that it has gotten bigger. Endorses applying a few compresses. Denies concern for foreign bodies or trauma. He is established with dermatology--last seen there about a year ago, per pt. Per records, he was seen by Dr. Lynnell of [facility].  Redness Pt complains of redness on his right cheek starting 1-2 months ago.  He states he's tried to be more gentle while shaving, to no improvement.  Denies using any new facial products.  Difficulty sleeping Pt complains of difficulty sleeping.  He states he has tried Benadryl before and does not recall any severe grogginess after taking it. He is inquiring about starting Hydroxyzine.  Chronic hip pain Patient is now seeing a [facility] provider for further evaluation of his orthopedic issues He is hoping to get some answers to some orthopedic issues that he has seen multiple providers for and has not gotten clear answers on He was recommended to have an MRI of his entire spine, and he needs these orders done at a local hospital organization versus [facility] as it is more affordable that way  Past Medical History:  Diagnosis Date   Anxiety    Blood transfusion without reported diagnosis 2010   During Chemo Treatments   Hypothyroidism    Leukemia, acute myeloid, in remission (HCC) 2010   Thyroid  disease      Social History   Tobacco Use   Smoking status: Never   Smokeless tobacco: Never  Vaping Use   Vaping status: Never Used  Substance Use Topics   Alcohol use: Yes    Comment: maybe one drink every two years   Drug use: No    Past Surgical History:   Procedure Laterality Date   CYSTECTOMY Left    Scrotum   PORT-A-CATH REMOVAL     WISDOM TOOTH EXTRACTION      Family History  Problem Relation Age of Onset   Hypertension Mother    Hypertension Father    Multiple sclerosis Maternal Grandmother    Dementia Maternal Grandmother    Diabetes Maternal Grandfather    Heart attack Maternal Grandfather 60   Diabetes Paternal Grandfather    Thyroid  cancer Maternal Aunt    Prostate cancer Neg Hx    Colon cancer Neg Hx     Allergies  Allergen Reactions   Topamax  [Topiramate ] Other (See Comments)    Anger/irritable   Trazodone  And Nefazodone Nausea Only    Current Medications:   Current Outpatient Medications:    acetaminophen  (TYLENOL ) 500 MG tablet, Take 1,000 mg by mouth every 6 (six) hours as needed., Disp: , Rfl:    diazepam  (VALIUM ) 5 MG tablet, Take 1 tablet (5 mg total) by mouth every 12 (twelve) hours as needed for muscle spasms, Disp: 30 tablet, Rfl: 0   hydrOXYzine (ATARAX) 25 MG tablet, Take 0.5-1 tablets (12.5-25 mg total) by mouth at bedtime., Disp: 30 tablet, Rfl: 0   ibuprofen (ADVIL) 200 MG tablet, Take 200-400 mg by mouth every 6 (six) hours as needed for moderate pain., Disp: , Rfl:  levothyroxine  (SYNTHROID ) 150 MCG tablet, Take 1 tablet (150 mcg total) by mouth daily., Disp: 90 tablet, Rfl: 3   metroNIDAZOLE (METROGEL) 0.75 % gel, Apply 1 Application topically 2 (two) times daily., Disp: 45 g, Rfl: 0   Multiple Vitamin (MULTIVITAMIN WITH MINERALS) TABS tablet, Take 1 tablet by mouth daily., Disp: , Rfl:    Review of Systems:   Negative unless otherwise specified per HPI.  Vitals:   Vitals:   05/19/24 1342  BP: 100/64  Pulse: 90  Temp: (!) 97.3 F (36.3 C)  TempSrc: Temporal  SpO2: 97%  Weight: 191 lb 4 oz (86.8 kg)  Height: 5' 8 (1.727 m)     Body mass index is 29.08 kg/m.  Physical Exam:   Physical Exam Vitals and nursing note reviewed.  Constitutional:      Appearance: He is  well-developed.  HENT:     Head: Normocephalic.     Comments: Small, mobile mass to lateral aspect of left eye with tenderness to palpation  Eyes:     Conjunctiva/sclera: Conjunctivae normal.     Pupils: Pupils are equal, round, and reactive to light.   Pulmonary:     Effort: Pulmonary effort is normal.   Musculoskeletal:        General: Normal range of motion.     Cervical back: Normal range of motion.   Skin:    General: Skin is warm and dry.     Comments: Right maxillary area with erythema   Neurological:     Mental Status: He is alert and oriented to person, place, and time.   Psychiatric:        Behavior: Behavior normal.        Thought Content: Thought content normal.        Judgment: Judgment normal.     Assessment and Plan:   1. Localized skin mass, lump, or swelling (Primary) Unclear etiology Suspect possible sebaceous cyst? I recommend since it is increasing in size and tenderness, to see a dermatologist for further evaluation He is going to contact his dermatologist and if unable to get in within a reasonable timeframe, he will report back  2. Abnormal pigmentation of skin Possible rosacea? Will trial topical metronidazole to see if this helps his symptoms Recommend close follow-up with dermatology  3. Insomnia, unspecified type Patient is requesting to trial hydroxyzine I have sent in 25 mg tablets for him to trial, recommend cutting in half and taking 12.5 mg for first dose and then if needed may increase to full tablet subsequent night  4. Chronic right hip pain I have asked him to go to the radiology site and take his printed orders from the [facility] provider If needed we can order this however I am not sure I feel comfortable doing prior authorization given that this is not my area of specialty  I, Lavern Simmers, acting as a Neurosurgeon for [employer], [place]., have documented all relevant documentation on the behalf of Lucie Buttner, [place], as directed by  Lucie Buttner, PA while in the presence of Lucie Buttner, [place].  I, Lucie Buttner, [place], have reviewed all documentation for this visit. The documentation on 05/19/24 for the exam, diagnosis, procedures, and orders are all accurate and complete.  Lucie Buttner, PA-C The radiology site

## 2024-07-27 ENCOUNTER — Encounter: Payer: Self-pay | Admitting: Sports Medicine

## 2024-08-05 DIAGNOSIS — M47812 Spondylosis without myelopathy or radiculopathy, cervical region: Secondary | ICD-10-CM | POA: Diagnosis not present

## 2024-08-05 DIAGNOSIS — M50322 Other cervical disc degeneration at C5-C6 level: Secondary | ICD-10-CM | POA: Diagnosis not present

## 2024-08-05 DIAGNOSIS — M51369 Other intervertebral disc degeneration, lumbar region without mention of lumbar back pain or lower extremity pain: Secondary | ICD-10-CM | POA: Diagnosis not present

## 2024-08-05 DIAGNOSIS — M48061 Spinal stenosis, lumbar region without neurogenic claudication: Secondary | ICD-10-CM | POA: Diagnosis not present

## 2024-08-05 DIAGNOSIS — M4802 Spinal stenosis, cervical region: Secondary | ICD-10-CM | POA: Diagnosis not present

## 2024-08-18 ENCOUNTER — Other Ambulatory Visit: Payer: Self-pay

## 2024-08-18 ENCOUNTER — Other Ambulatory Visit: Payer: Self-pay | Admitting: Physician Assistant

## 2024-08-18 ENCOUNTER — Other Ambulatory Visit (HOSPITAL_BASED_OUTPATIENT_CLINIC_OR_DEPARTMENT_OTHER): Payer: Self-pay

## 2024-08-18 MED ORDER — DIAZEPAM 5 MG PO TABS
5.0000 mg | ORAL_TABLET | Freq: Two times a day (BID) | ORAL | 0 refills | Status: AC | PRN
  Filled 2024-08-18: qty 30, 15d supply, fill #0

## 2024-08-18 NOTE — Telephone Encounter (Signed)
 Pt requesting refill for Valium  5 mg tablet. Last OV 04/29/2024.

## 2024-08-20 ENCOUNTER — Other Ambulatory Visit (HOSPITAL_BASED_OUTPATIENT_CLINIC_OR_DEPARTMENT_OTHER): Payer: Self-pay

## 2024-08-30 DIAGNOSIS — G5713 Meralgia paresthetica, bilateral lower limbs: Secondary | ICD-10-CM | POA: Diagnosis not present

## 2024-08-30 DIAGNOSIS — M5442 Lumbago with sciatica, left side: Secondary | ICD-10-CM | POA: Diagnosis not present

## 2024-08-30 DIAGNOSIS — G588 Other specified mononeuropathies: Secondary | ICD-10-CM | POA: Diagnosis not present

## 2024-08-30 DIAGNOSIS — M5441 Lumbago with sciatica, right side: Secondary | ICD-10-CM | POA: Diagnosis not present

## 2024-08-30 DIAGNOSIS — M79604 Pain in right leg: Secondary | ICD-10-CM | POA: Diagnosis not present

## 2024-08-30 DIAGNOSIS — F4320 Adjustment disorder, unspecified: Secondary | ICD-10-CM | POA: Diagnosis not present

## 2024-08-30 DIAGNOSIS — G8929 Other chronic pain: Secondary | ICD-10-CM | POA: Diagnosis not present

## 2024-08-30 DIAGNOSIS — M5459 Other low back pain: Secondary | ICD-10-CM | POA: Diagnosis not present

## 2024-08-30 DIAGNOSIS — M79605 Pain in left leg: Secondary | ICD-10-CM | POA: Diagnosis not present

## 2024-08-30 DIAGNOSIS — G894 Chronic pain syndrome: Secondary | ICD-10-CM | POA: Diagnosis not present

## 2024-08-30 DIAGNOSIS — M542 Cervicalgia: Secondary | ICD-10-CM | POA: Diagnosis not present

## 2024-08-30 DIAGNOSIS — R202 Paresthesia of skin: Secondary | ICD-10-CM | POA: Diagnosis not present

## 2024-08-30 DIAGNOSIS — M549 Dorsalgia, unspecified: Secondary | ICD-10-CM | POA: Diagnosis not present

## 2024-09-27 ENCOUNTER — Encounter: Payer: Self-pay | Admitting: Radiology

## 2024-10-04 DIAGNOSIS — G588 Other specified mononeuropathies: Secondary | ICD-10-CM | POA: Diagnosis not present

## 2024-10-04 DIAGNOSIS — G5713 Meralgia paresthetica, bilateral lower limbs: Secondary | ICD-10-CM | POA: Diagnosis not present

## 2024-10-07 DIAGNOSIS — M542 Cervicalgia: Secondary | ICD-10-CM | POA: Diagnosis not present

## 2024-10-07 DIAGNOSIS — M79605 Pain in left leg: Secondary | ICD-10-CM | POA: Diagnosis not present

## 2024-10-07 DIAGNOSIS — M79604 Pain in right leg: Secondary | ICD-10-CM | POA: Diagnosis not present

## 2024-10-07 DIAGNOSIS — M5459 Other low back pain: Secondary | ICD-10-CM | POA: Diagnosis not present

## 2024-10-14 ENCOUNTER — Telehealth: Payer: Self-pay | Admitting: Neurosurgery

## 2024-10-14 NOTE — Telephone Encounter (Signed)
 Patient is currently being seen at [facility] and previously received an injection in 2023. He may be interested in another injection with Dr. Marcelino but has not seen him since 2023. Pt states that they are experiencing left thoracic spine/hip pain and possible peripheral nerve involvement. He has also [facility] with [facility], but he did not feel it did anything for him. Pt also had an MRI on 09/25--please request these images from [facility]. Once we have reviewed the imaging, please advise if the available information is sufficient to schedule him with Dr. Claudene.

## 2024-10-15 NOTE — Telephone Encounter (Signed)
 Images requested from [facility] via [employer]: 08/05/2024- MRI CERVICAL SPINE WITHOUT CONTRAST  08/05/2024- MRI THORACIC SPINE WITHOUT CONTRAST  08/05/2024- MRI LUMBAR SPINE WITHOUT CONTRAST

## 2024-10-18 ENCOUNTER — Inpatient Hospital Stay
Admission: RE | Admit: 2024-10-18 | Discharge: 2024-10-18 | Disposition: A | Discharge status: Home / Self Care | Payer: Self-pay | Source: Ambulatory Visit | Attending: Neurosurgery | Admitting: Neurosurgery

## 2024-10-18 ENCOUNTER — Other Ambulatory Visit: Payer: Self-pay | Admitting: Family Medicine

## 2024-10-18 DIAGNOSIS — Z049 Encounter for examination and observation for unspecified reason: Secondary | ICD-10-CM

## 2024-10-28 ENCOUNTER — Ambulatory Visit: Payer: 59 | Admitting: Physician Assistant

## 2024-10-28 ENCOUNTER — Other Ambulatory Visit (HOSPITAL_BASED_OUTPATIENT_CLINIC_OR_DEPARTMENT_OTHER): Payer: Self-pay

## 2024-10-28 ENCOUNTER — Encounter: Payer: Self-pay | Admitting: Physician Assistant

## 2024-10-28 ENCOUNTER — Other Ambulatory Visit: Payer: Self-pay | Admitting: Physician Assistant

## 2024-10-28 ENCOUNTER — Other Ambulatory Visit: Payer: Self-pay

## 2024-10-28 ENCOUNTER — Ambulatory Visit: Payer: Self-pay | Admitting: Physician Assistant

## 2024-10-28 VITALS — BP 100/64 | HR 79 | Temp 97.3°F | Ht 68.0 in | Wt 193.8 lb

## 2024-10-28 DIAGNOSIS — E538 Deficiency of other specified B group vitamins: Secondary | ICD-10-CM

## 2024-10-28 DIAGNOSIS — E039 Hypothyroidism, unspecified: Secondary | ICD-10-CM

## 2024-10-28 DIAGNOSIS — E663 Overweight: Secondary | ICD-10-CM | POA: Diagnosis not present

## 2024-10-28 DIAGNOSIS — Z0000 Encounter for general adult medical examination without abnormal findings: Secondary | ICD-10-CM

## 2024-10-28 DIAGNOSIS — G8929 Other chronic pain: Secondary | ICD-10-CM | POA: Diagnosis not present

## 2024-10-28 DIAGNOSIS — M25551 Pain in right hip: Secondary | ICD-10-CM | POA: Diagnosis not present

## 2024-10-28 DIAGNOSIS — Z8639 Personal history of other endocrine, nutritional and metabolic disease: Secondary | ICD-10-CM

## 2024-10-28 DIAGNOSIS — Z23 Encounter for immunization: Secondary | ICD-10-CM

## 2024-10-28 LAB — CBC WITH DIFFERENTIAL/PLATELET
Basophils Absolute: 0 K/uL (ref 0.0–0.1)
Basophils Relative: 0.8 % (ref 0.0–3.0)
Eosinophils Absolute: 0.2 K/uL (ref 0.0–0.7)
Eosinophils Relative: 3.9 % (ref 0.0–5.0)
HCT: 46.6 % (ref 39.0–52.0)
Hemoglobin: 16.2 g/dL (ref 13.0–17.0)
Lymphocytes Relative: 22.4 % (ref 12.0–46.0)
Lymphs Abs: 1.4 K/uL (ref 0.7–4.0)
MCHC: 34.7 g/dL (ref 30.0–36.0)
MCV: 91.3 fl (ref 78.0–100.0)
Monocytes Absolute: 0.8 K/uL (ref 0.1–1.0)
Monocytes Relative: 12.8 % — ABNORMAL HIGH (ref 3.0–12.0)
Neutro Abs: 3.8 K/uL (ref 1.4–7.7)
Neutrophils Relative %: 60.1 % (ref 43.0–77.0)
Platelets: 204 K/uL (ref 150.0–400.0)
RBC: 5.11 Mil/uL (ref 4.22–5.81)
RDW: 13.1 % (ref 11.5–15.5)
WBC: 6.3 K/uL (ref 4.0–10.5)

## 2024-10-28 LAB — COMPREHENSIVE METABOLIC PANEL WITH GFR
ALT: 15 U/L (ref 0–53)
AST: 17 U/L (ref 0–37)
Albumin: 4.5 g/dL (ref 3.5–5.2)
Alkaline Phosphatase: 58 U/L (ref 39–117)
BUN: 17 mg/dL (ref 6–23)
CO2: 23 meq/L (ref 19–32)
Calcium: 9.4 mg/dL (ref 8.4–10.5)
Chloride: 103 meq/L (ref 96–112)
Creatinine, Ser: 0.87 mg/dL (ref 0.40–1.50)
GFR: 106.47 mL/min (ref >60.00)
Glucose, Bld: 81 mg/dL (ref 70–99)
Potassium: 3.8 meq/L (ref 3.5–5.1)
Sodium: 137 meq/L (ref 135–145)
Total Bilirubin: 1.1 mg/dL (ref 0.2–1.2)
Total Protein: 6.6 g/dL (ref 6.0–8.3)

## 2024-10-28 LAB — LIPID PANEL
Cholesterol: 151 mg/dL (ref 0–200)
HDL: 44 mg/dL (ref >39.00)
LDL Cholesterol: 87 mg/dL (ref 0–99)
NonHDL: 106.64
Total CHOL/HDL Ratio: 3
Triglycerides: 97 mg/dL (ref 0.0–149.0)
VLDL: 19.4 mg/dL (ref 0.0–40.0)

## 2024-10-28 LAB — TSH: TSH: 0.37 u[IU]/mL (ref 0.35–5.50)

## 2024-10-28 MED ORDER — LEVOTHYROXINE SODIUM 150 MCG PO TABS
150.0000 ug | ORAL_TABLET | Freq: Every day | ORAL | 3 refills | Status: AC
  Filled 2024-10-28 – 2024-11-27 (×2): qty 90, 90d supply, fill #0

## 2024-10-28 MED ORDER — NALTREXONE HCL 50 MG PO TABS
ORAL_TABLET | ORAL | 1 refills | Status: AC
  Filled 2024-10-28: qty 30, 30d supply, fill #0

## 2024-10-28 MED ORDER — NALTREX 1.5 MG PO CAPS
ORAL_CAPSULE | ORAL | 1 refills | Status: DC
  Filled 2024-10-28: qty 90, fill #0

## 2024-10-28 NOTE — Progress Notes (Signed)
 Subjective:    Zachary Little is a 43 y.o. male and is here for a comprehensive physical exam.  HPI  There are no preventive care reminders to display for this patient.  Discussed the use of AI scribe software for clinical note transcription with the patient, who gave verbal consent to proceed.  History of Present Illness   Zachary Little is a 43 year old male who presents for follow-up on pain management and medication issues.  He has chronic pain that limits his ability to exercise and has contributed to weight gain. He found prior physical therapy unhelpful. He was prescribed compounded naltrexone  1.5 mg but never received it due to pharmacy communication issues and lack of insurance coverage. He is considering using a 50 mg tablet to self-adjust the dose.  He is trying to resume physical activity and follow a lower calorie diet to lose weight. He uses Valium  very rarely, about once a year, and takes levothyroxine  150 mcg daily. He drinks alcohol extremely infrequently.  He has poor sleep with prolonged sleep onset. Trazodone  tried in 2022 caused next-day drowsiness without improving sleep.  He has constipation with hard stools and is not using any treatment.  He denies unexplained headaches, numbness, tingling, tremor, ankle swelling, diarrhea, uncontrolled heartburn, and rectal bleeding.       Health Maintenance: Immunizations -- declined flu Colonoscopy -- N/A  PSA -- No results found for: PSA1, PSA Diet -- see above Sleep habits -- lots of trouble falling asleep Exercise -- limited due to pain  Weight -- Weight: 193 lb 12.8 oz (87.9 kg)  Recent weight history Wt Readings from Last 10 Encounters:  10/28/24 193 lb 12.8 oz (87.9 kg)  05/19/24 191 lb 4 oz (86.8 kg)  10/28/23 186 lb (84.4 kg)  09/29/23 183 lb (83 kg)  09/16/23 182 lb 11.2 oz (82.9 kg)  10/01/22 205 lb (93 kg)  09/25/22 209 lb (94.8 kg)  05/29/22 210 lb 6.1 oz (95.4 kg)  03/28/22 205 lb (93  kg)  03/13/22 205 lb 9.6 oz (93.3 kg)   Body mass index is 29.47 kg/m.  Mood -- no major concerns Alcohol use --  reports current alcohol use.  Tobacco use --  Tobacco Use: Low Risk  (10/28/2024)   Patient History    Smoking Tobacco Use: Never    Smokeless Tobacco Use: Never    Passive Exposure: Not on file    Eligible for Low Dose CT? no  UTD with eye doctor? yes UTD with dentist? yes     10/28/2023    8:43 AM  Depression screen PHQ 2/9  Decreased Interest 0  Down, Depressed, Hopeless 0  PHQ - 2 Score 0    Other providers/specialists: Patient Care Team: Job Lukes, [place] as PCP - General (Physician Assistant) Dow Maxwell, PT as Physical Therapist (Physical Therapy)    PMHx, SurgHx, SocialHx, Medications, and Allergies were reviewed in the Visit Navigator and updated as appropriate.   Past Medical History:  Diagnosis Date   Anxiety    Blood transfusion without reported diagnosis 2010   During Chemo Treatments   Hypothyroidism    Leukemia, acute myeloid, in remission (HCC) 2010   Thyroid  disease      Past Surgical History:  Procedure Laterality Date   CYSTECTOMY Left    Scrotum   PORT-A-CATH REMOVAL     WISDOM TOOTH EXTRACTION       Family History  Problem Relation Age of Onset   Hypertension Mother  Hypertension Father    Multiple sclerosis Maternal Grandmother    Dementia Maternal Grandmother    Diabetes Maternal Grandfather    Heart attack Maternal Grandfather 14   Diabetes Paternal Grandfather    Thyroid  cancer Maternal Aunt    Prostate cancer Neg Hx    Colon cancer Neg Hx     Social History   Tobacco Use   Smoking status: Never   Smokeless tobacco: Never  Vaping Use   Vaping status: Never Used  Substance Use Topics   Alcohol use: Yes    Comment: maybe one drink every two years   Drug use: No    Review of Systems:   Review of Systems  Constitutional:  Negative for chills, fever, malaise/fatigue and weight loss.  HENT:   Negative for hearing loss, sinus pain and sore throat.   Respiratory:  Negative for cough and hemoptysis.   Cardiovascular:  Negative for chest pain, palpitations, leg swelling and PND.  Gastrointestinal:  Negative for abdominal pain, constipation, diarrhea, heartburn, nausea and vomiting.  Genitourinary:  Negative for dysuria, frequency and urgency.  Musculoskeletal:  Negative for back pain, myalgias and neck pain.  Skin:  Negative for itching and rash.  Neurological:  Negative for dizziness, tingling, seizures and headaches.  Endo/Heme/Allergies:  Negative for polydipsia.  Psychiatric/Behavioral:  Negative for depression. The patient is not nervous/anxious.     Objective:    Vitals:   10/28/24 0824  BP: 100/64  Pulse: 79  Temp: (!) 97.3 F (36.3 C)  SpO2: 98%    Body mass index is 29.47 kg/m.  General  Alert, cooperative, no distress, appears stated age  Head:  Normocephalic, without obvious abnormality, atraumatic  Eyes:  PERRL, conjunctiva/corneas clear, EOM's intact, fundi benign, both eyes       Ears:  Normal TM's and external ear canals, both ears  Nose: Nares normal, septum midline, mucosa normal, no drainage or sinus tenderness  Throat: Lips, mucosa, and tongue normal; teeth and gums normal  Neck: Supple, symmetrical, trachea midline, no adenopathy;     thyroid :  No enlargement/tenderness/nodules; no carotid bruit or JVD  Back:   Symmetric, no curvature, ROM normal, no CVA tenderness  Lungs:   Clear to auscultation bilaterally, respirations unlabored  Chest wall:  No tenderness or deformity  Heart:  Regular rate and rhythm, S1 and S2 normal, no murmur, rub or gallop  Abdomen:   Soft, non-tender, bowel sounds active all four quadrants, no masses, no organomegaly  Extremities: Extremities normal, atraumatic, no cyanosis or edema  Prostate : Deferred   Skin: Skin color, texture, turgor normal, no rashes or lesions  Lymph nodes: Cervical, supraclavicular, and  axillary nodes normal  Neurologic: CNII-XII grossly intact. Normal strength, sensation and reflexes throughout   AssessmentPlan:   Assessment and Plan    Adult Wellness Visit Routine visit with stable blood pressure. Reports sleep difficulty and occasional constipation. No significant changes in family history, alcohol consumption, or diet. Up to date with dental and eye exams. - Ordered blood work including thyroid  and cholesterol levels. - Encouraged regular exercise and weight management.  Chronic right hip pain Ongoing management with previous consultation at [facility]. Issues with pharmacy compounding of naltrexone 1.5 mg. Discussed alternative dosing strategy. Insurance coverage concerns noted. - Sent prescription for naltrexone 1.5 mg and will monitor insurance response. - If compounded medication is not covered, will prescribe 50 mg naltrexone and instruct to crush to achieve desired dose. - Continue follow-up with peripheral nerve  careers adviser at [facility] in January.  Hypothyroidism Managed with levothyroxine . Existing symptoms remain stable. - Continue current levothyroxine  150 mcg daily regimen. - Ordered thyroid  function tests as part of blood work.  Overweight Weight gain due to decreased exercise. Attempting to resume exercise and lower calorie intake. - Encouraged regular exercise and dietary modifications to aid weight loss.      Lucie Buttner, PA-C [facility]

## 2024-11-26 ENCOUNTER — Encounter: Payer: Self-pay | Admitting: Family Medicine

## 2024-11-26 NOTE — Progress Notes (Signed)
 "   Referring Physician:  Job Lukes, PA [address] [place],  [place] [address]  Primary Physician:  Job Lukes, [place]  Discussed the use of AI scribe software for clinical note transcription with the patient, who gave verbal consent to proceed.  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  Chest wall pain: - Deep left chest wall and rib pain for several years, localized under the left rib and wrapping to the anterior chest wall. - Described as rubbing or crushing. Worsened by driving, direct pressure, soft mattress, and lying on his side with the affected side elevated. Improved on a firm surface. - Rarely triggered by exercise, with occasional provocation during wood chopper movements. No history of trauma. - Multiple courses of physical therapy, trigger point injections, and a nerve or rib block have provided no meaningful or only transient relief. - Prior imaging includes MRI of the cervical, thoracic, and lumbar spine, CT abdomen, chest x-rays, and MRI of the mediastinum and anterior chest wall. - Pain significantly disrupts sleep. He intermittently uses Valium  to facilitate sleep, with unclear effect on pain.  Left thigh pain / meralgia paresthetica: - Left thigh burning pain and dysesthesia for 10-11 years, with intermittent numbness and cutaneous discomfort. - Symptoms are worse when lying supine with legs extended and improve with knee flexion using a pillow. Not provoked by sitting or palpation. - Pain is well localized and burning. Worsens with prolonged supine positioning. - Diagnosed with meralgia paresthetica. Gabapentin  was ineffective. Symptoms persist despite 50-pound weight loss and avoiding tight clothing. - EMG at [facility] was supportive of possible [facility] neuropathy - Lumbar injection at L4-5 and two nerve  blocks, including recent ultrasound-guided block with hydrodissection at the ASIS and inguinal ligament, produced only transient numbness without pain relief.  Medications tried/pending: - Has not yet trialed prescribed low-dose naltrexone  or naltrexone  25 mg.  Duration: years  Bowel/Bladder Dysfunction: none  Conservative measures:  Physical therapy: has participated in PT at [facility] Multimodal medical therapy including regular antiinflammatories:  Tylenol , Naltrexone , Ibuprofen Injections: 11/05/2021 right L3-L4 transforaminal ESI   Past Surgery: no spine surgeries  The symptoms are causing a significant impact on the patient's life.   I have utilized the care everywhere function in epic to review the outside records available from external health systems.  Review of Systems:  A 10 point review of systems is negative, except for the pertinent positives and negatives detailed in the HPI.  Past Medical History: Past Medical History:  Diagnosis Date   Anxiety    Blood transfusion without reported diagnosis 2010   During Chemo Treatments   Hypothyroidism    Leukemia, acute myeloid, in remission (HCC) 2010   Thyroid  disease     Past Surgical History: Past Surgical History:  Procedure Laterality Date   CYSTECTOMY Left    Scrotum   PORT-A-CATH REMOVAL     WISDOM TOOTH EXTRACTION      Allergies: Allergies as of 12/06/2024 - Review Complete 12/06/2024  Allergen Reaction Noted   Topamax  [topiramate ] Other (See Comments) 09/05/2021   Trazodone  and nefazodone Nausea Only 01/12/2021    Medications: Current Medications[1]  Social History: Social History[2]  Family Medical History: Family History  Problem Relation Age of Onset   Hypertension Mother    Hypertension Father    Multiple sclerosis Maternal Grandmother    Dementia Maternal Grandmother  Diabetes Maternal Grandfather    Heart attack Maternal Grandfather 57   Diabetes Paternal Grandfather    Thyroid  cancer  Maternal Aunt    Prostate cancer Neg Hx    Colon cancer Neg Hx     Physical Examination: Vitals:   12/06/24 1510  BP: 124/68   NEUROLOGICAL:   Strength:  Side Iliopsoas Quads Hamstring PF DF EHL  R 5 5 5 5 5 5   L 5 5 5 5 5 5     Bilateral upper and lower extremity sensation is intact to light touch, does have some LFCN dysesthetic pain on the right.     No evidence of dysmetria noted.  No thoracic nerve region sensation loss  Gait is normal.    Imaging: MRI CERVICAL SPINE WITHOUT CONTRAST  MRI THORACIC SPINE WITHOUT CONTRAST  MRI  LUMBAR SPINE WITHOUT CONTRAST   INDICATION: Neck pain, chronic, G89.4 Chronic pain syndrome, R20.2  Paresthesia of skin, M54.9 Dorsalgia, unspecified, G89.29 Other chronic  pain, M54.2 Cervicalgia, G89.29 Other chronic pain, M54.42 Lumbago with  sciatica, left side, M54.41 Lumbago with sciatica, right side, G89.29 Other  chronic pain, M54.2 Cervicalgia   COMPARISON: January 26, 2023 MRI   TECHNIQUE/PROTOCOL:  Extradural protocol MRI of the cervical, thoracic, and lumbar spine was  performed without contrast administration.   FINDINGS:  Anatomical Variants: Conventional spinal numbering.  Surgical Findings: None.  Alignment: Normal.  Bone Marrow Signal: No fracture. No AVN. Mild degenerative endplate changes  at C5-6 with associated endplate edema. No fracture.  Spinal Cord and Cauda Equina: Spinal cord is normal in morphology and  signal. Normal appearance of the cauda equina.  Conus Medullaris: Terminates at approximately L1.   Degenerative Changes:  Cervical Spine: Disc bulge and endplate degenerative changes at C5-6 cause  moderate spinal canal stenosis and moderate left greater than right neural  foraminal stenosis.  Thoracic Spine: No high-grade spinal canal or neuroforaminal stenosis.  Lumbar Spine: Minimal disc bulges at L3-4 and L4-5. No spinal canal  stenosis. Mild right L3-L5 neural foraminal stenosis.   Sacroiliac Joints:  Not well visualized.   Regional Soft Tissues: Unremarkable.    IMPRESSION:  1.  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 This is grossly similar to 2024.  2.  No thoracic spine or lumbar spine spinal canal stenosis.   I also reviewed some of his old imaging and did not find any dedicated rib imaging.  He did have an anterior mediastinum MRI but this did not appear to go to his areas of greatest concern for his rib region pain on the left.  I have personally reviewed the images and agree with the above interpretation.  Assessment and Plan Assessment & Plan Chronic left-sided chest wall pain Chronic, deep, positional left-sided chest wall pain of unclear etiology, not clearly consistent with intercostal neuralgia or radiculopathy.  No sensation changes, no clear dermatomal involvement.  Areas of no pain in between his 2 areas of deep point tenderness. differential includes rib pathology or other chest wall processes. Prior imaging has not directly evaluated the areas of interest. Previous nerve block provided only transient relief and was not adequately tested for efficacy. Further evaluation is necessary to identify the pain generator and guide management. - Sent message to radiology to determine appropriate MRI protocol for left-sided rib/chest wall imaging. - Planned thoracic surgery consultation for optimal imaging and evaluation of rib pathology. - Recommended follow-up with pain management (Lateef) for possible repeat nerve  block at the symptomatic site to clarify diagnosis and assess response. - Advised him to contact [facility] pain directly for coordination of pain management interventions.  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 Prior nerve blocks and hydrodissection produced  numbness but did not relieve burning pain, possibly due to anatomical variation or incomplete coverage of nerve branches.  Patient also lost 50 pounds and still did not have significant improvement.  EMG was inconclusive. Surgical decompression is a low morbidity option if symptoms are disabling and refractory to conservative measures. Nerve stimulator or neurectomy are further options if decompression fails, with risk of rebound pain or incomplete relief. Precise localization of symptoms is important for procedural planning. - Recommended repeat ultrasound-guided nerve block/hydrodissection with Lateef to target all branches of the lateral femoral cutaneous nerve and confirm diagnosis. - Discussed surgical decompression as a low morbidity option if nerve block provides diagnostic relief and conservative measures remain ineffective. - Discussed nerve stimulator and neurectomy as further options if decompression fails, with caution regarding risk of rebound pain and incomplete relief. - Advised him to document and share precise location of symptoms during exacerbations to aid procedural planning.  Penne MICAEL Sharps MD/MSCR Neurosurgery      [1]  Current Outpatient Medications:    diazepam  (VALIUM ) 5 MG tablet, Take 1 tablet (5 mg total) by mouth every 12 (twelve) hours as needed for muscle spasms, Disp: 30 tablet, Rfl: 0   ibuprofen (ADVIL) 200 MG tablet, Take 200-400 mg by mouth every 6 (six) hours as needed for moderate pain., Disp: , Rfl:    levothyroxine  (SYNTHROID ) 150 MCG tablet, Take 1 tablet (150 mcg total) by mouth daily., Disp: 90 tablet, Rfl: 3   naltrexone  (DEPADE) 50 MG tablet, Take 0.5 tablets (25 mg total) by mouth daily for 7 days, THEN if tolerated take 1 tablet (50 mg total) daily. (Patient not taking: No sig reported), Disp: 30 tablet, Rfl: 1   NONFORMULARY OR COMPOUNDED ITEM, Take 1.5 mg by mouth once. Instructions: Take 1.5 mg by mouth once daily Med Name: Naltrexone  Start at 1.5  mg PO QHS X 1 week and then 3 mg PO QHS X 7 days and then 4.5 mg PO QHS (Patient not taking: Reported on 12/06/2024), Disp: , Rfl:  [2]  Social History Tobacco Use   Smoking status: Never   Smokeless tobacco: Never  Vaping Use   Vaping status: Never Used  Substance Use Topics   Alcohol use: Yes    Comment: maybe one drink every two years   Drug use: No   "

## 2024-11-27 ENCOUNTER — Other Ambulatory Visit (HOSPITAL_COMMUNITY): Payer: Self-pay

## 2024-11-29 ENCOUNTER — Other Ambulatory Visit (HOSPITAL_COMMUNITY): Payer: Self-pay

## 2024-11-29 ENCOUNTER — Ambulatory Visit: Admitting: Neurosurgery

## 2024-12-01 ENCOUNTER — Ambulatory Visit (HOSPITAL_BASED_OUTPATIENT_CLINIC_OR_DEPARTMENT_OTHER): Admitting: Orthopaedic Surgery

## 2024-12-01 DIAGNOSIS — M79605 Pain in left leg: Secondary | ICD-10-CM

## 2024-12-01 MED ORDER — TRIAMCINOLONE ACETONIDE 40 MG/ML IJ SUSP
80.0000 mg | INTRAMUSCULAR | Status: AC | PRN
  Administered 2024-12-01: 80 mg via INTRA_ARTICULAR

## 2024-12-01 MED ORDER — LIDOCAINE HCL 1 % IJ SOLN
4.0000 mL | INTRAMUSCULAR | Status: AC | PRN
  Administered 2024-12-01: 4 mL

## 2024-12-01 NOTE — Progress Notes (Signed)
 "                                Post Operative Evaluation    Procedure/Date of Surgery: Left hip arthroscopy with labral repair and cam debridement 11/4  Presents today for follow-up of the right shoulder as well as the left hip.  At today's visit his symptoms are more consistent with trigger points involving the vastus lateralis  PMH/PSH/Family History/Social History/Meds/Allergies:    Past Medical History:  Diagnosis Date   Anxiety    Blood transfusion without reported diagnosis 2010   During Chemo Treatments   Hypothyroidism    Leukemia, acute myeloid, in remission (HCC) 2010   Thyroid  disease    Past Surgical History:  Procedure Laterality Date   CYSTECTOMY Left    Scrotum   PORT-A-CATH REMOVAL     WISDOM TOOTH EXTRACTION     Social History   Socioeconomic History   Marital status: Married    Spouse name: Not on file   Number of children: 0   Years of education: college   Highest education level: Bachelor's degree (e.g., BA, AB, BS)  Occupational History   Occupation: Disabled  Tobacco Use   Smoking status: Never   Smokeless tobacco: Never  Vaping Use   Vaping status: Never Used  Substance and Sexual Activity   Alcohol use: Yes    Comment: maybe one drink every two years   Drug use: No   Sexual activity: Yes    Partners: Female  Other Topics Concern   Not on file  Social History Narrative   Lives at home with his wife.   Left-handed.   No daily caffeine use.    Social Drivers of Health   Tobacco Use: Low Risk (10/28/2024)   Patient History    Smoking Tobacco Use: Never    Smokeless Tobacco Use: Never    Passive Exposure: Not on file  Financial Resource Strain: Low Risk  (08/30/2024)   Received from [facility]   Overall Financial Resource Strain (CARDIA)    Difficulty of Paying Living Expenses: Not hard at all  Food Insecurity: No Food Insecurity (08/30/2024)   Received from [facility]   Epic    Within the past 12 months, you worried that your food would run out before you got the money to buy more.: Never true    Within the past 12 months, the food you bought just didn't last and you didn't have money to get more.: Never true  Transportation Needs: No Transportation Needs (08/30/2024)   Received from [facility] - Transportation    In the past 12 months, has lack of transportation kept you from medical appointments or from getting medications?: No    Lack of Transportation (Non-Medical): No  Physical Activity: Insufficiently Active (02/14/2023)   Exercise Vital Sign    Days of Exercise per Week: 3 days    Minutes of Exercise per Session: 30 min  Stress: No Stress Concern Present (02/14/2023)   [employer] - Occupational Stress Questionnaire    Feeling of Stress : Only a little  Social Connections: Unknown (02/14/2023)   Social Connection and Isolation Panel    Frequency of Communication with Friends and Family: Once a week    Frequency of Social Gatherings with Friends and Family: Patient declined    Attends Religious Services: Never  Active Member of Clubs or Organizations: No    Attends Banker Meetings: Not on file    Marital Status: Married  Depression (PHQ2-9): Low Risk (10/28/2024)   Depression (PHQ2-9)    PHQ-2 Score: 2  Alcohol Screen: Not on file  Housing: Low Risk  (08/30/2024)   Received from [facility]   Epic    In the last 12 months, was there a time when you were not able to pay the mortgage or rent on time?: No    In the past 12 months, how many times have you moved where you were living?: 0    At any time in the past 12 months, were you homeless or living in a shelter (including now)?: No  Utilities: Not At Risk (08/30/2024)   Received from [facility]   Epic    In the past 12 months has the electric, gas, oil, or water  company threatened to shut off services in your home?: No  Health Literacy: Not on file   Family History  Problem Relation Age of Onset   Hypertension Mother    Hypertension Father    Multiple sclerosis Maternal Grandmother    Dementia Maternal Grandmother    Diabetes Maternal Grandfather    Heart attack Maternal Grandfather 52   Diabetes Paternal Grandfather    Thyroid  cancer Maternal Aunt    Prostate cancer Neg Hx    Colon cancer Neg Hx    Allergies  Allergen Reactions   Topamax  [Topiramate ] Other (See Comments)    Anger/irritable   Trazodone  And Nefazodone Nausea Only   Current Outpatient Medications  Medication Sig Dispense Refill   acetaminophen  (TYLENOL ) 500 MG tablet Take 1,000 mg by mouth every 6 (six) hours as needed.     diazepam  (VALIUM ) 5 MG tablet Take 1 tablet (5 mg total) by mouth every 12 (twelve) hours as needed for muscle spasms 30 tablet 0   ibuprofen (ADVIL) 200 MG tablet Take 200-400 mg by mouth every 6 (six) hours as needed for moderate pain.     levothyroxine  (SYNTHROID ) 150 MCG tablet Take 1 tablet (150 mcg total) by mouth daily. 90 tablet 3   NONFORMULARY OR COMPOUNDED ITEM Take 1.5 mg by mouth once. Instructions: Take 1.5 mg by mouth once daily Med Name: Naltrexone  Start at 1.5 mg PO QHS X 1 week and then 3 mg PO QHS X 7 days and then 4.5 mg PO QHS     No current facility-administered medications for this visit.   No results found.  Review of Systems:   A ROS was performed including pertinent positives and negatives as documented in the HPI.   Musculoskeletal Exam:    There were no vitals taken for this visit.  Left hip with 30 degrees internal/external rotation of the hip without pain.  Active flexion of the left hip is to 90 degrees.  Mild Trendelenburg gait.  Walks with crutches.  Remainder of distal neurosensory exam is intact with 2+ dorsalis pedis pulse  Right shoulder with tenderness about the medial scapula without  palpable clicking.  There is scapular winging as he goes into forward elevation.  Negative O'Brien.  Forward elevation is to 170 degrees with external rotation to 50 degrees  Imaging:      I personally reviewed and interpreted the radiographs.   Assessment:   Experiencing lateral thigh pain consistent with significant trigger point of the vastus lateralis.  Given this I have offered him a left knee  trigger point injection Plan :    - Left thigh trigger point injection provided verbal consent obtained    Procedure Note  Patient: Zachary Little             Date of Birth: 01/21/1981           MRN: [id number]             Visit Date: 12/01/2024  Procedures: Visit Diagnoses: No diagnosis found.  Large Joint Inj on 12/01/2024 5:15 PM Indications: pain Details: 22 G 3.5 in needle, ultrasound-guided anterolateral approach  Arthrogram: No  Medications: 4 mL lidocaine  1 %; 80 mg triamcinolone  acetonide 40 MG/ML Outcome: tolerated well, no immediate complications Procedure, treatment alternatives, risks and benefits explained, specific risks discussed. Consent was given by the patient. Immediately prior to procedure a time out was called to verify the correct patient, procedure, equipment, support staff and site/side marked as required. Patient was prepped and draped in the usual sterile fashion.         I personally saw and evaluated the patient, and participated in the management and treatment plan.  Elspeth Parker, MD Attending Physician, Orthopedic Surgery  This document was dictated using Dragon voice recognition software. A reasonable attempt at proof reading has been made to minimize errors. "

## 2024-12-03 ENCOUNTER — Ambulatory Visit (HOSPITAL_BASED_OUTPATIENT_CLINIC_OR_DEPARTMENT_OTHER): Admitting: Orthopaedic Surgery

## 2024-12-06 ENCOUNTER — Ambulatory Visit: Admitting: Neurosurgery

## 2024-12-06 ENCOUNTER — Encounter: Payer: Self-pay | Admitting: Neurosurgery

## 2024-12-06 VITALS — BP 124/68 | Ht 68.0 in | Wt 194.4 lb

## 2024-12-06 DIAGNOSIS — R0781 Pleurodynia: Secondary | ICD-10-CM | POA: Insufficient documentation

## 2024-12-06 DIAGNOSIS — R0789 Other chest pain: Secondary | ICD-10-CM

## 2024-12-06 DIAGNOSIS — G5711 Meralgia paresthetica, right lower limb: Secondary | ICD-10-CM | POA: Diagnosis not present

## 2024-12-13 ENCOUNTER — Encounter: Payer: Self-pay | Admitting: Student in an Organized Health Care Education/Training Program

## 2024-12-13 ENCOUNTER — Ambulatory Visit: Admitting: Student in an Organized Health Care Education/Training Program

## 2024-12-13 VITALS — BP 114/78 | HR 77 | Temp 97.9°F | Resp 16 | Ht 68.0 in | Wt 195.0 lb

## 2024-12-13 DIAGNOSIS — G5711 Meralgia paresthetica, right lower limb: Secondary | ICD-10-CM | POA: Diagnosis not present

## 2024-12-13 DIAGNOSIS — M79604 Pain in right leg: Secondary | ICD-10-CM | POA: Insufficient documentation

## 2024-12-13 DIAGNOSIS — G894 Chronic pain syndrome: Secondary | ICD-10-CM | POA: Diagnosis not present

## 2024-12-13 NOTE — Progress Notes (Signed)
 Safety precautions to be maintained throughout the outpatient stay will include: orient to surroundings, keep bed in low position, maintain call bell within reach at all times, provide assistance with transfer out of bed and ambulation.

## 2024-12-13 NOTE — Patient Instructions (Addendum)
 Plan for right LFCN block    ______________________________________________________________________    Procedure instructions  Stop blood-thinners  Do not eat or drink fluids (other than water) for 8 hours before your procedure  No water for 2 hours before your procedure  Take your blood pressure medicine with a sip of water  Arrive 30 minutes before your appointment  If sedation is planned, bring suitable driver. [place], [place], & public transportation are NOT APPROVED)  Carefully read the Preparing for your procedure detailed instructions  If you have questions call us  at [phone]  Procedure appointments are for procedures only.   NO medication refills or new problem evaluations will be done on procedure days.   Only the scheduled, pre-approved procedure and side will be done.   ______________________________________________________________________

## 2024-12-13 NOTE — Progress Notes (Signed)
 PROVIDER NOTE: Interpretation of information contained herein should be left to medically-trained personnel. Specific patient instructions are provided elsewhere under Patient Instructions section of medical record. This document was created in part using AI and STT-dictation technology, any transcriptional errors that may result from this process are unintentional.  Patient: Zachary Little  Service: E/M Encounter  Provider: Wallie Sherry, MD  DOB: 05-15-1981  Delivery: Face-to-face  Specialty: Interventional Pain Management  MRN: [id number]  Setting: Ambulatory outpatient facility  Specialty designation: 09  Type: New Patient  Location: Outpatient office facility  PCP: Job Lukes, [place]  DOS: 12/13/2024    Referring Prov.: Claudene Penne ORN, MD   Primary Reason(s) for Visit: Encounter for initial evaluation of one or more chronic problems (new to examiner) potentially causing chronic pain, and posing a threat to normal musculoskeletal function. (Level of risk: High) CC: Leg Pain (Right thigh - right lateral aspect)  HPI  Zachary Little is a 44 y.o. year old, male patient, who comes for the first time to our practice referred by Claudene Penne ORN, MD for our initial evaluation of his chronic pain. 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 Today he comes in for evaluation of his Leg Pain (Right thigh - right lateral aspect)  Pain Assessment: Location:     Radiating: denies Onset: More than a month ago Duration: Chronic pain Quality: Burning, Numbness, Other (Comment) (stinging) Severity: 0-No pain/10 (subjective, self-reported pain score)  Effect on ADL: standing in one place for extended periods, laying supine or prone exacerbates pain Timing: Intermittent Modifying factors: NOT standing for long periods BP: 114/78  HR: 77  Onset and Duration: Sudden, Date of onset: 2016, and Present longer than 3 months Cause of pain: Unknown Severity: No change since onset, NAS-11 at its worse: 8/10, NAS-11 at its best: 0/10, NAS-11 now: 0/10, and NAS-11 on the average: 1/10 Timing: Night and After activity or exercise Aggravating Factors: Walking uphill Alleviating Factors: Standing Associated Problems: Numbness, Tingling, and Pain that does not allow patient to sleep Quality of Pain: Dull, Hot, Tingling, and Uncomfortable Previous Examinations or Tests: MRI scan, Nerve conduction test, and Neurosurgical evaluation Previous Treatments: The patient denies previous treatments  Zachary Little is being evaluated for possible interventional pain management therapies for the treatment of his chronic pain.    History of Present Illness   Zachary Little is a 44 year old male who presents with right thigh pain and numbness for evaluation of a possible nerve block. He was referred by Dr. Claudene for evaluation of his right thigh pain and numbness.  He experiences sharp, stinging pain in his right thigh, which becomes painful about five minutes after lying supine. The pain intensifies to a heated sensation after about thirty minutes, followed by numbness. Movement after numbness resolves the symptoms within five to ten seconds. The pain is physically painful when pressure is applied or when dragging a finger along the  area.  The pain  is exacerbated by standing in one spot for too long, especially without shoes, and by activities such as doing dishes or sitting in one spot. Walking uphill or on a treadmill with a high incline significantly worsens the pain, which he describes as 'angry' at night. The pain improves when he is off his legs, as experienced after a surgery a year ago when he had to stay off his legs for two weeks.  He had a right hip joint injection about a year ago, which resulted in numbness of the top of his thigh but did not alleviate the underlying issue. Nerve conduction studies were inconclusive as the nerve could not be located.  He mentions a history of rib pain that is exacerbated when sleeping on his side, which he has been dealing with for three years. A previous intercostal nerve block for the rib pain did not provide relief as it was performed in the morning and he could not test its efficacy while lying down.       Meds  Current Medications[1]  Imaging Review  Cervical Imaging: Cervical MR wo contrast: Results for orders placed in visit on 01/26/23  MR CERVICAL SPINE WO CONTRAST  Narrative CLINICAL DATA:  Initial evaluation for chronic myelopathy, left-sided neck pain.  EXAM: MRI CERVICAL SPINE WITHOUT CONTRAST  TECHNIQUE: Multiplanar, multisequence MR imaging of the cervical spine was performed. No intravenous contrast was administered.  COMPARISON:  Prior MRI from 11/26/2017.  FINDINGS: Alignment: Straightening of the normal cervical lordosis. No significant listhesis.  Vertebrae: Vertebral body height maintained without acute or chronic fracture. Bone marrow signal intensity within normal limits. No worrisome osseous lesions. Mild discogenic reactive endplate change with marrow edema present about the C5-6 interspace. No other abnormal marrow edema.  Cord: Normal signal and morphology.  Posterior Fossa, vertebral arteries, paraspinal  tissues: Unremarkable.  Disc levels:  C2-C3: Unremarkable.  C3-C4:  Unremarkable.  C4-C5: Mild disc bulge with disc desiccation. No spinal stenosis. Foramina remain patent.  C5-C6: Degenerative intervertebral disc space narrowing. Broad-based posterior disc bulge flattens and largely effaces the ventral thecal sac, asymmetric to the right (series 5, image 18). Mild cord flattening without cord signal changes. Moderate spinal stenosis. Superimposed uncovertebral spurring with resultant mild left C6 foraminal narrowing. Right neural foramen remains patent.  C6-C7: Disc desiccation with minimal disc bulge. No spinal stenosis. Foramina remain patent.  C7-T1:  Unremarkable.  IMPRESSION: 1. Broad-based posterior disc bulge with uncovertebral spurring at C5-6 with resultant moderate spinal stenosis, with mild left C6 foraminal narrowing. 2. Additional mild noncompressive disc bulging at C4-5 and C6-7 without stenosis or impingement.   Electronically Signed By: Morene Hoard M.D. On: 01/26/2023 18:22  DG Cervical Spine 2 or 3 views  Narrative CLINICAL DATA:  44 year old male with bilateral neck and back pain since February with no known injury. Low back pain progresses at night when supine.  EXAM: CERVICAL SPINE - 2-3 VIEW  COMPARISON:  Cervical spine MRI 07/09/2016.  FINDINGS: Chronic straightening of cervical lordosis. Normal prevertebral soft tissue contour. Cervicothoracic junction alignment is within normal limits. Normal AP alignment. C1-C2 alignment and the odontoid appear normal. Stable disc spaces, preserved except for C5-C6 and C6-C7. No acute osseous abnormality identified. Negative lung apices.  IMPRESSION: Radiographic appearance of the cervical spine appears stable to the MRI in 2017. Mild C5-C6 and C6-C7 disc space loss. No acute osseous abnormality identified.   Electronically Signed By: VEAR Hurst M.D. On: 11/11/2017 08:14   MR SHOULDER  RIGHT  W CONTRAST  Narrative CLINICAL DATA:  Right shoulder pain.  EXAM: MRI OF THE RIGHT SHOULDER WITH CONTRAST  TECHNIQUE: Multiplanar, multisequence MR imaging of the right shoulder was performed following the administration of intra-articular contrast.  CONTRAST:  See Injection Documentation.  COMPARISON:  Right shoulder x-rays dated March 21, 2023.  FINDINGS: Rotator cuff:  Intact without significant tendinosis.  Muscles:  No focal muscular atrophy or edema.  Biceps long head:  Intact and normally positioned.  Acromioclavicular Joint: Normal acromioclavicular joint. No significant fluid or contrast is present in the subacromial/subdeltoid bursa.  Glenohumeral Joint: Distended with intra-articular contrast. No chondral defect.  Labrum: Superior labral tear (series 5, image 10). Remaining labrum is intact.  Bones: No acute fracture or dislocation. No suspicious bone lesion.  Other: None.  IMPRESSION: 1. Superior labral tear.   Electronically Signed By: Elsie ONEIDA Shoulder M.D. On: 04/04/2023 16:11  DG Shoulder Right  Narrative CLINICAL DATA:  Pain  EXAM: RIGHT SHOULDER - 2+ VIEW  COMPARISON:  None Available.  FINDINGS: There is no evidence of fracture or dislocation. There is no evidence of arthropathy or other focal bone abnormality. Soft tissues are unremarkable.  IMPRESSION: Negative.   Electronically Signed By: Alm Pouch III M.D. On: 03/22/2023 15:02  Shoulder-L DG: No results found for this or any previous visit.   Thoracic Imaging: Thoracic MR wo contrast: Results for orders placed during the hospital encounter of 12/21/17  MR Thoracic Spine Wo Contrast  Narrative CLINICAL DATA:  Left flank and back pain over the last year. History of leukemia.  EXAM: MRI THORACIC SPINE WITHOUT CONTRAST  TECHNIQUE: Multiplanar, multisequence MR imaging of the thoracic spine was performed. No intravenous contrast was  administered.  COMPARISON:  Radiography 11/10/2017  FINDINGS: Alignment:  Normal  Vertebrae: Normal. No focal lesion or marrow space abnormality. Insignificant tiny hemangioma within the central T11 vertebral body.  Cord:  No abnormal cord finding.  Paraspinal and other soft tissues: Normal  Disc levels:  Normal. No degenerative disc disease. No stenosis. No foraminal abnormality.  IMPRESSION: Normal examination. No abnormality seen to explain back or flank pain.   Electronically Signed By: Oneil Officer M.D. On: 12/21/2017 10:14   DG Thoracic Spine 2 View  Narrative CLINICAL DATA:  Bilateral neck and upper back pain radiating into the lower back for the past 10 months with no known injury symptoms are greatest at night when in a supine position.  EXAM: THORACIC SPINE 2 VIEWS  COMPARISON:  Thoracic spine series dated Mar 27, 2017  FINDINGS: The thoracic vertebral bodies are preserved in height. There is chronic gentle dextrocurvature centered in the midthoracic spine. The pedicles are intact. There are no abnormal paravertebral soft tissue densities. There is mild multilevel degenerative disc space narrowing.  IMPRESSION: There is no acute bony abnormality of the thoracic spine. There is very mild degenerative disc disease at multiple levels.   Electronically Signed By: David  Jordan M.D. On: 11/11/2017 08:12  Lumbar MR wo contrast: Results for orders placed in visit on 10/13/22  MR LUMBAR SPINE WO CONTRAST  Narrative CLINICAL DATA:  Hematologic malignancy. Monitor history of AML pain to the left hip and thigh.  EXAM: MRI LUMBAR SPINE WITHOUT CONTRAST  TECHNIQUE: Multiplanar, multisequence MR imaging of the lumbar spine was performed. No intravenous contrast was administered.  COMPARISON:  MRI examination dated July 02, 2020  FINDINGS: Segmentation:  Standard.  Alignment:  Physiologic.  Vertebrae: Diffuse T1 hypointense marrow signal  throughout the spine, similar to prior  examination and suggesting myeloproliferative disorder or red marrow conversion. No acute fracture. No evidence of discitis.  Conus medullaris and cauda equina: Conus extends to the L1 level. Conus and cauda equina appear normal.  Paraspinal and other soft tissues: Negative.  Disc levels:  T12-L1: No significant disc bulge. No neural foraminal stenosis. No central canal stenosis.  L1-L2: No significant disc bulge. No neural foraminal stenosis. No central canal stenosis.  L2-L3: Mild disc bulge with narrowing of lateral recess. No significant neural foraminal stenosis. Ligamentum flavum hypertrophy.  L3-L4: Mild broad-based disc bulge with mild lateral recess stenosis bilaterally. No significant neural foraminal stenosis.  L4-L5: Moderate disc bulge with narrowing of lateral recesses bilaterally. Mild bilateral facet joint arthropathy.  L5-S1: No significant disc bulge. No neural foraminal stenosis. No central canal stenosis.  IMPRESSION: 1. No evidence of discitis or osteomyelitis. 2. Diffuse T1 hypointense marrow signal throughout the spine, similar to prior examination and suggesting myeloproliferative disorder or red marrow conversion. 3. Mild lumbar spine spondylosis as described above.   Electronically Signed By: Imran  Ahmed D.O. On: 10/14/2022 23:16   Lumbar DG 2-3 views: Results for orders placed in visit on 11/10/17  DG Lumbar Spine 2-3 Views  Narrative CLINICAL DATA:  44 year old male with bilateral neck and back pain since February with no known injury. Low back pain progresses at night when supine.  EXAM: LUMBAR SPINE - 2-3 VIEW  COMPARISON:  MRI lumbar spine 07/09/2016. Lumbar radiographs 02/06/2016. Thoracic spine radiographs today reported separately.  FINDINGS: Normal thoracic spine segmentation demonstrated on the radiographs today. Normal lumbar segmentation. Slight levoconvex lumbar spine curvature  is stable since 2017. Straightening of lumbar lordosis also is stable. Disc spaces appear relatively preserved and stable since the 2017 MRI. Minimal endplate spurring is chronic. Sacral ala and SI joints appear stable and normal. No acute osseous abnormality identified.  IMPRESSION: Stable since 2017. Straightening of lumbar lordosis with minimal levoconvex lumbar curvature. No acute osseous abnormality.   Electronically Signed By: VEAR Hurst M.D. On: 11/11/2017 08:17    Narrative CLINICAL DATA:  Chronic low back pain.  EXAM: LUMBAR SPINE - COMPLETE 4+ VIEW  COMPARISON:  Plain films lumbar spine 11/10/2017.  FINDINGS: Straightening of lordosis and minimal convex left curvature are unchanged. Vertebral body height and alignment are normal. Intervertebral disc space height is normal. Paraspinous structures demonstrate a large volume of stool in the ascending colon.  IMPRESSION: No acute abnormality or change compared to the prior exam.  Minimal convex left lumbar curvature and straightening of lordosis.  Large stool burden ascending colon.   Electronically Signed By: Debby Prader M.D. On: 01/26/2020 16:15  DG NERVE ROOT BLOCK LUMBAR-SACRAL EACH ADD LEVEL  Narrative CLINICAL DATA:  44 year old male with lumbosacral spondylosis without myelopathy. He has facet hypertrophy and endplate spurring resulting in L3 and L4 neural foraminal stenosis. His symptoms are localized to the right lower extremity. He presents for right L3 and right L4 transforaminal nerve root blocks.  EXAM: EPIDURAL/NERVE ROOT; L/S NERVE ROOT BLOCK EACH ADDITIONAL  FLUOROSCOPY TIME:  0 minutes 53 seconds 5 mGy  PROCEDURE: The procedure, risks, benefits, and alternatives were explained to the patient. Questions regarding the procedure were encouraged and answered. The patient understands and consents to the procedure.  RIGHT L4 NERVE ROOT BLOCK AND TRANSFORAMINAL EPIDURAL: A  posterior oblique approach was taken to the intervertebral foramen on the right at L4 using a curved 22 gauge spinal needle. Injection of Omnipaque  180 outlined the L4 nerve  root and showed good epidural spread. No vascular opacification is seen. 40 mg of Depo-Medrol  mixed with 1 mL 1% lidocaine  were instilled. The procedure was well-tolerated, and the patient was discharged thirty minutes following the injection in good condition.  RIGHT L3 NERVE ROOT BLOCK AND TRANSFORAMINAL EPIDURAL: A posterior oblique approach was taken to the intervertebral foramen on the right at L3 using a curved 22 gauge spinal needle. Injection of Omnipaque  180 outlined the L3 nerve root and showed good epidural spread. No vascular opacification is seen. 40 mg of Depo-Medrol  mixed with 1 mL 1% lidocaine  were instilled. The procedure was well-tolerated, and the patient was discharged thirty minutes following the injection in good condition.  COMPLICATIONS: None  IMPRESSION: Technically successful injection consisting of a right L4 nerve root block and transforaminal epidural.  Technically successful injection consisting of a right L3 nerve root block and transforaminal epidural.   Electronically Signed By: Wilkie Lent M.D. On: 06/06/2021 10:51  Narrative CLINICAL DATA:  Sacral pain for 2 months.  No known injury.  EXAM: MR SACRUM WITHOUT CONTRAST  TECHNIQUE: Multiplanar, multisequence MR imaging of the sacrum was performed. No intravenous contrast was administered.  COMPARISON:  None Available.  FINDINGS: Bones/Joint/Cartilage  Marrow signal is normal without fracture, stress change or focal lesion. No erosion or edema about the SI joints. The joints are not ankylosed. No joint effusion. Visualized lower lumbar spine is normal in appearance.  Ligaments  Intact and normal in appearance.  Muscles and Tendons  Intact and normal in appearance.  Soft  tissues  Negative.  IMPRESSION: Normal MRI sacrum.   Electronically Signed By: Debby Prader M.D. On: 11/29/2023 09:23  MR HIP RIGHT W CONTRAST  Narrative CLINICAL DATA:  Pain about the lateral aspect right hip for approximately 8 years. No known injury.  EXAM: MRI OF THE RIGHT HIP WITH CONTRAST (MR Arthrogram)  TECHNIQUE: Multiplanar, multisequence MR imaging of the hip was performed immediately following contrast injection into the hip joint under fluoroscopic guidance. No intravenous contrast was administered.  COMPARISON:  Plain films right hip 07/01/2023.  FINDINGS: Bones: No fracture, stress change or worrisome lesion is identified. 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 Mild subchondral edema is seen in the periphery of the left acetabulum. No avascular necrosis of the femoral heads.  Articular cartilage and labrum  Articular cartilage: Mildly thinned on the right without focal defect. There is also some cartilage thinning about the left hip.  Labrum: There is tearing of the anterior right labrum. The superior labrum is degenerated with mild fraying.  Joint or bursal effusion  Joint effusion: The right hip is distended with contrast. No left hip effusion.  Bursae: Negative.  Muscles and tendons  Muscles and tendons:  Intact and normal in appearance.  Other findings  Miscellaneous:   None.  IMPRESSION: 1. Mild appearing right hip osteoarthritis with tearing of the anterior labrum. The superior labrum is degenerated and frayed. 2. Mild appearing left hip osteoarthritis.   Electronically Signed By: Debby Prader M.D. On: 11/29/2023 09:14  Hip-L MR w contrast: Results for orders placed in visit on 03/31/23  MR HIP LEFT W CONTRAST  Narrative CLINICAL DATA:  Anterior left thigh pain for several months. No injury or prior surgery.  EXAM: MRI OF THE LEFT HIP WITH  CONTRAST (MR ARTHROGRAM)  TECHNIQUE: Multiplanar, multisequence MR imaging was performed following the administration of intra-articular contrast.  CONTRAST:  1mL GADAVIST   GADOBUTROL  1 MMOL/ML IV SOLN  COMPARISON:  Left hip x-rays dated October 01, 2022.  FINDINGS: Bones: There is no evidence of acute fracture, dislocation or avascular necrosis. 0.9 x 1.5 x 1.7 cm non-aggressive well-defined T2 hyperintense lesion in the left intertrochanteric femur, likely an enchondroma. The visualized sacroiliac joints appear normal. Mild reactive marrow edema in the pubic symphysis.  Articular cartilage and labrum  Articular cartilage: No focal chondral defect or subchondral signal abnormality identified.  Labrum: Fraying and partial tearing of the left anterior superior labrum. No paralabral abnormality.  Joint or bursal effusion  Joint effusion: The left hip joint is distended with intra-articular contrast. No right hip joint effusion.  Bursae: No focal periarticular fluid collection.  Muscles and tendons  Muscles and tendons: The visualized gluteus, hamstring and iliopsoas tendons appear normal. No muscle edema or atrophy.  Other findings  Miscellaneous: The visualized internal pelvic contents appear unremarkable.  IMPRESSION: 1. Fraying and partial tearing of the left anterior superior labrum.   Electronically Signed By: Elsie ONEIDA Shoulder M.D. On: 04/04/2023 16:07   DG HIP UNILAT W OR W/O PELVIS 2-3 VIEWS RIGHT  Narrative CLINICAL DATA:  Right hip pain  EXAM: DG HIP (WITH OR WITHOUT PELVIS) 3V RIGHT  COMPARISON:  None Available.  FINDINGS: There is no evidence of hip fracture or dislocation. Pelvic ring is intact. There is no evidence of arthropathy or other focal bone abnormality.  IMPRESSION: Negative.   Electronically Signed By: Fonda Field M.D. On: 07/04/2023 13:33  Hip-L DG 2-3 views: Results for orders placed in visit on 10/01/22  DG HIP  UNILAT W OR W/O PELVIS 2-3 VIEWS LEFT  Narrative CLINICAL DATA:  Pain left hip, evaluate for bony lesion versus osteoarthritis  EXAM: DG HIP (WITH OR WITHOUT PELVIS) 2-3V LEFT  COMPARISON:  12/15/2019  FINDINGS: There is no evidence of hip fracture or dislocation. There is no evidence of arthropathy or other focal bone abnormality.  IMPRESSION: No acute abnormality or significant arthropathy by plain radiography   Electronically Signed By: CHRISTELLA.  Shick M.D. On: 10/04/2022 16:51   Narrative CLINICAL DATA:  Medial left knee pain for the past 5 months. Injury in November 2018.  EXAM: MRI OF THE LEFT KNEE WITHOUT CONTRAST  TECHNIQUE: Multiplanar, multisequence MR imaging of the knee was performed. No intravenous contrast was administered.  COMPARISON:  Left knee x-rays dated October 01, 2017.  FINDINGS: MENISCI  Medial meniscus:  Intact.  Lateral meniscus:  Intact.  LIGAMENTS  Cruciates:  Intact ACL and PCL.  Collaterals: Medial collateral ligament is intact. Lateral collateral ligament complex is intact.  CARTILAGE  Patellofemoral: Focal near full-thickness fissure over the trochlear groove.  Medial:  No chondral defect.  Lateral:  No chondral defect.  Joint:  No joint effusion. Normal Hoffa's fat. No plical thickening.  Popliteal Fossa:  No Baker cyst. Intact popliteus tendon.  Extensor Mechanism: Intact quadriceps tendon and patellar tendon. Intact medial and lateral patellar retinaculum. Intact MPFL.  Bones: No focal marrow signal abnormality. No fracture or dislocation.  Other: None.  IMPRESSION: 1. No acute abnormality.  No evidence of internal derangement.   Electronically Signed By: Elsie ONEIDA Shoulder M.D. On: 03/08/2018 16:27   Narrative CLINICAL DATA:  One week of left knee pain near the patellar tendon without acute injury. History of AML in remission.  EXAM: LEFT KNEE - 1-2 VIEW  COMPARISON:  None in PACs  FINDINGS: The  bones are subjectively adequately mineralized. There is no lytic nor blastic lesion. There  is no acute or old fracture or periosteal reaction. The joint spaces are well maintained. There is no significant osteophyte formation. No joint effusion is observed. In the infrapatellar region no soft tissue abnormalities are demonstrated.  The observed portions of the right knee are unremarkable.  IMPRESSION: There is no acute or significant chronic bony abnormality of the left knee.   Electronically Signed By: David  Jordan M.D. On: 10/01/2017 14:01   Complexity Note: Imaging results reviewed.                         ROS  Cardiovascular: No reported cardiovascular signs or symptoms such as High blood pressure, coronary artery disease, abnormal heart rate or rhythm, heart attack, blood thinner therapy or heart weakness and/or failure Pulmonary or Respiratory: No reported pulmonary signs or symptoms such as wheezing and difficulty taking a deep full breath (Asthma), difficulty blowing air out (Emphysema), coughing up mucus (Bronchitis), persistent dry cough, or temporary stoppage of breathing during sleep Neurological: No reported neurological signs or symptoms such as seizures, abnormal skin sensations, urinary and/or fecal incontinence, being born with an abnormal open spine and/or a tethered spinal cord Psychological-Psychiatric: Difficulty sleeping and or falling asleep Gastrointestinal: No reported gastrointestinal signs or symptoms such as vomiting or evacuating blood, reflux, heartburn, alternating episodes of diarrhea and constipation, inflamed or scarred liver, or pancreas or irrregular and/or infrequent bowel movements Genitourinary: No reported renal or genitourinary signs or symptoms such as difficulty voiding or producing urine, peeing blood, non-functioning kidney, kidney stones, difficulty emptying the bladder, difficulty controlling the flow of urine, or chronic kidney  disease Hematological: No reported hematological signs or symptoms such as prolonged bleeding, low or poor functioning platelets, bruising or bleeding easily, hereditary bleeding problems, low energy levels due to low hemoglobin or being anemic Endocrine: Slow thyroid  Rheumatologic: No reported rheumatological signs and symptoms such as fatigue, joint pain, tenderness, swelling, redness, heat, stiffness, decreased range of motion, with or without associated rash Musculoskeletal: Negative for myasthenia gravis, muscular dystrophy, multiple sclerosis or malignant hyperthermia Work History: Quit going to work on his/her own  Allergies  Mr. Mckey is allergic to topamax  [topiramate ] and trazodone  and nefazodone.  Laboratory Chemistry Profile   Renal Lab Results  Component Value Date   BUN 17 10/28/2024   CREATININE 0.87 10/28/2024   BCR NOT APPLICABLE 08/14/2020   GFR 106.47 10/28/2024     Electrolytes Lab Results  Component Value Date   NA 137 10/28/2024   K 3.8 10/28/2024   CL 103 10/28/2024   CALCIUM 9.4 10/28/2024     Hepatic Lab Results  Component Value Date   AST 17 10/28/2024   ALT 15 10/28/2024   ALBUMIN 4.5 10/28/2024   ALKPHOS 58 10/28/2024     ID Lab Results  Component Value Date   HIV NON-REACTIVE 08/26/2017     Bone Lab Results  Component Value Date   VD25OH 19.42 (L) 09/25/2022   VD125OH2TOT 69 11/16/2019   [id number] 69 11/16/2019   VD2125OH2 <8 11/16/2019   TESTOFREE 19.0 11/16/2019   TESTOSTERONE 382 11/16/2019     Endocrine Lab Results  Component Value Date   GLUCOSE 81 10/28/2024   TSH 0.37 10/28/2024   FREET4 1.22 06/02/2019   TESTOFREE 19.0 11/16/2019   TESTOSTERONE 382 11/16/2019   SHBG 28.9 11/16/2019     Neuropathy Lab Results  Component Value Date   VITAMINB12 259 09/25/2022   FOLATE >23.7 04/14/2020   HIV NON-REACTIVE 08/26/2017  CNS No results found for: COLORCSF, APPEARCSF, RBCCOUNTCSF, WBCCSF, POLYSCSF,  LYMPHSCSF, EOSCSF, PROTEINCSF, GLUCCSF, JCVIRUS, CSFOLI, IGGCSF, LABACHR, ACETBL   Inflammation (CRP: Acute  ESR: Chronic) Lab Results  Component Value Date   CRP 0.4 (L) 05/22/2017   ESRSEDRATE 2 11/16/2019     Rheumatology No results found for: RF, ANA, LABURIC, URICUR, LYMEIGGIGMAB, LYMEABIGMQN, HLAB27   Coagulation Lab Results  Component Value Date   PLT 204.0 10/28/2024     Cardiovascular Lab Results  Component Value Date   CKTOTAL 75.0 06/05/2015   HGB 16.2 10/28/2024   HCT 46.6 10/28/2024     Screening Lab Results  Component Value Date   HIV NON-REACTIVE 08/26/2017     Cancer No results found for: CEA, CA125, LABCA2   Allergens No results found for: ALMOND, APPLE, ASPARAGUS, AVOCADO, BANANA, BARLEY, BASIL, BAYLEAF, GREENBEAN, LIMABEAN, WHITEBEAN, BEEFIGE, REDBEET, BLUEBERRY, BROCCOLI, CABBAGE, MELON, CARROT, CASEIN, CASHEWNUT, CAULIFLOWER, CELERY     Note: Lab results reviewed.  PFSH  Drug: Mr. Saraceno  reports no history of drug use. Alcohol:  reports current alcohol use. Tobacco:  reports that he has never smoked. He has never used smokeless tobacco. Medical:  has a past medical history of Anxiety, Blood transfusion without reported diagnosis (2010), Hypothyroidism, Leukemia, acute myeloid, in remission (HCC) (2010), and Thyroid  disease. Family: family history includes Dementia in his maternal grandmother; Diabetes in his maternal grandfather and paternal grandfather; Heart attack (age of onset: 75) in his maternal grandfather; Hypertension in his father and mother; Multiple sclerosis in his maternal grandmother; Thyroid  cancer in his maternal aunt.  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   Depression, major, single episode, complete remission 03/09/2018   Erectile dysfunction 03/09/2018   Obesity 03/09/2018   Labral tear of shoulder, right, sequela 10/06/2019   Chronic pain syndrome 12/29/2019   Neuropathic pain of thigh, right 12/29/2019   Pes anserinus bursitis of left knee 03/19/2022   DDD (degenerative disc disease), cervical, 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 C6 foraminal stenosis with left-sided periscapular discomfort 04/23/2022   Labral tear of left hip joint 10/01/2022   Right leg pain 06/11/2023   Chronic right hip pain 09/23/2023   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   Scrotal sebaceous cyst 06/11/2022   Costal margin pain 12/06/2024   Meralgia paraesthetica, right 12/06/2024   Resolved Ambulatory Problems    Diagnosis Date Noted   Thoracic radiculopathy 05/23/2017   Meralgia paresthetica of right side 05/23/2017   Thoracic spondylosis 10/28/2017   Osteoarthritis of spine 11/26/2017   Abnormal gait 01/02/2018   Quadriceps strain, left, initial encounter 10/06/2019   Nonallopathic lesion of thoracic region 11/16/2019   Nonallopathic lesion of cervical region 11/16/2019   Nonallopathic lesion of rib cage 11/16/2019   Trigger point of right shoulder region 11/16/2019   Lateral femoral cutaneous entrapment syndrome, right 12/29/2019   Lateral femoral cutaneous neuropathy, right 12/29/2019   Neuropathic pain involving lateral femoral cutaneous nerve, right 12/29/2019   Thoracolumbar back pain 01/26/2020   Left-sided chest wall pain 01/21/2023   Pain in left leg 06/11/2023   Past Medical History:  Diagnosis  Date   Anxiety    Blood transfusion without reported diagnosis 2010   Hypothyroidism    Leukemia, acute myeloid, in remission (HCC) 2010   Thyroid   disease    Constitutional Exam  General appearance: Well nourished, well developed, and well hydrated. In no apparent acute distress Vitals:   12/13/24 1358  BP: 114/78  Pulse: 77  Resp: 16  Temp: 97.9 F (36.6 C)  SpO2: 100%  Weight: 195 lb (88.5 kg)  Height: 5' 8 (1.727 m)   BMI Assessment: Estimated body mass index is 29.65 kg/m as calculated from the following:   Height as of this encounter: 5' 8 (1.727 m).   Weight as of this encounter: 195 lb (88.5 kg).  BMI interpretation table: BMI level Category Range association with higher incidence of chronic pain  <18 kg/m2 Underweight   18.5-24.9 kg/m2 Ideal body weight   25-29.9 kg/m2 Overweight Increased incidence by 20%  30-34.9 kg/m2 Obese (Class I) Increased incidence by 68%  35-39.9 kg/m2 Severe obesity (Class II) Increased incidence by 136%  >40 kg/m2 Extreme obesity (Class III) Increased incidence by 254%   Patient's current BMI Ideal Body weight  Body mass index is 29.65 kg/m. Ideal body weight: 68.4 kg (150 lb 12.7 oz) Adjusted ideal body weight: 76.4 kg (168 lb 7.6 oz)   BMI Readings from Last 4 Encounters:  12/13/24 29.65 kg/m  12/06/24 29.55 kg/m  10/28/24 29.47 kg/m  05/19/24 29.08 kg/m   Wt Readings from Last 4 Encounters:  12/13/24 195 lb (88.5 kg)  12/06/24 194 lb 6 oz (88.2 kg)  10/28/24 193 lb 12.8 oz (87.9 kg)  05/19/24 191 lb 4 oz (86.8 kg)    Psych/Mental status: Alert, oriented x 3 (person, place, & time)       Eyes: PERLA Respiratory: No evidence of acute respiratory distress  Right lateral thigh and leg pain, present when supine and resting  Assessment  Primary Diagnosis & Pertinent Problem List: The primary encounter diagnosis was Meralgia paraesthetica, right. Diagnoses of Right leg pain and Chronic pain syndrome were also pertinent to this visit.  Visit Diagnosis (New problems to examiner): 1. Meralgia paraesthetica, right   2. Right leg pain   3. Chronic pain syndrome     Plan of Care (Initial workup plan)  Assessment and Plan    Meralgia paresthetica, right lower limb   He experiences chronic right thigh pain with a sharp stinging sensation, numbness, and heat, worsened by lying supine and prolonged standing or sitting. Symptoms suggest possible lateral femoral cutaneous nerve compression. Previous nerve conduction studies were inconclusive, and a prior hip joint injection provided only temporary relief. Symptoms intensify with activities like walking uphill.  A lateral femoral cutaneous nerve block with local anesthetic and steroid will be performed under ultrasound guidance to assess response and confirm diagnosis. The procedure is scheduled for Wednesday afternoon. If the nerve block yields a positive response, nerve ablation will be considered.        Procedure Orders         FEMORAL NERVE BLOCK       Provider-requested follow-up: Return in about 2 days (around 12/15/2024) for Right LFCN U/S, in clinic NS.  Future Appointments  Date Time Provider Department Center  12/15/2024 10:40 AM Marcelino Nurse, MD [facility] None  12/31/2024 10:45 AM Genelle Standing, MD [facility] [id number]  01/12/2025  4:15 PM Genelle Standing, MD [facility] [address]  11/04/2025  8:00 AM Job Lukes, PA [facility]   I personally  spent a total of 60 minutes in the care of the patient today including preparing to see the patient, getting/reviewing separately obtained history, performing a medically appropriate exam/evaluation, counseling and educating, placing orders, and documenting clinical information in the EHR.   Note by: Wallie Sherry, MD (TTS and AI technology used. I apologize for any typographical errors that were not detected and corrected.) Date: 12/13/2024; Time: 3:15 PM     [1]  Current Outpatient Medications:    diazepam  (VALIUM ) 5 MG tablet, Take 1 tablet (5 mg total) by mouth every 12 (twelve) hours as needed for muscle spasms, Disp: 30 tablet, Rfl: 0    ibuprofen (ADVIL) 200 MG tablet, Take 200-400 mg by mouth every 6 (six) hours as needed for moderate pain., Disp: , Rfl:    levothyroxine  (SYNTHROID ) 150 MCG tablet, Take 1 tablet (150 mcg total) by mouth daily., Disp: 90 tablet, Rfl: 3   NONFORMULARY OR COMPOUNDED ITEM, Take 1.5 mg by mouth once. Instructions: Take 1.5 mg by mouth once daily Med Name: Naltrexone  Start at 1.5 mg PO QHS X 1 week and then 3 mg PO QHS X 7 days and then 4.5 mg PO QHS (Patient not taking: Reported on 12/13/2024), Disp: , Rfl:

## 2024-12-15 ENCOUNTER — Encounter: Payer: Self-pay | Admitting: Student in an Organized Health Care Education/Training Program

## 2024-12-15 ENCOUNTER — Ambulatory Visit: Admitting: Podiatry

## 2024-12-15 ENCOUNTER — Ambulatory Visit
Attending: Student in an Organized Health Care Education/Training Program | Admitting: Student in an Organized Health Care Education/Training Program

## 2024-12-15 VITALS — BP 127/77 | HR 76 | Temp 98.2°F | Resp 17 | Ht 68.0 in | Wt 195.0 lb

## 2024-12-15 DIAGNOSIS — M79604 Pain in right leg: Secondary | ICD-10-CM | POA: Diagnosis not present

## 2024-12-15 DIAGNOSIS — M79651 Pain in right thigh: Secondary | ICD-10-CM | POA: Insufficient documentation

## 2024-12-15 DIAGNOSIS — G5711 Meralgia paresthetica, right lower limb: Secondary | ICD-10-CM | POA: Insufficient documentation

## 2024-12-15 MED ORDER — ROPIVACAINE HCL 2 MG/ML IJ SOLN
9.0000 mL | Freq: Once | INTRAMUSCULAR | Status: AC
  Administered 2024-12-15: 9 mL via PERINEURAL

## 2024-12-15 MED ORDER — LIDOCAINE HCL 2 % IJ SOLN
INTRAMUSCULAR | Status: AC
  Filled 2024-12-15: qty 20

## 2024-12-15 MED ORDER — DEXAMETHASONE SOD PHOSPHATE PF 10 MG/ML IJ SOLN
INTRAMUSCULAR | Status: AC
  Filled 2024-12-15: qty 1

## 2024-12-15 MED ORDER — ROPIVACAINE HCL 2 MG/ML IJ SOLN
INTRAMUSCULAR | Status: AC
  Filled 2024-12-15: qty 20

## 2024-12-15 MED ORDER — DEXAMETHASONE SOD PHOSPHATE PF 10 MG/ML IJ SOLN
10.0000 mg | Freq: Once | INTRAMUSCULAR | Status: AC
  Administered 2024-12-15: 10 mg

## 2024-12-15 MED ORDER — LIDOCAINE HCL 2 % IJ SOLN
20.0000 mL | Freq: Once | INTRAMUSCULAR | Status: AC
  Administered 2024-12-15: 400 mg

## 2024-12-15 NOTE — Progress Notes (Signed)
 PROVIDER NOTE: Interpretation of information contained herein should be left to medically-trained personnel. Specific patient instructions are provided elsewhere under Patient Instructions section of medical record. This document was created in part using STT-dictation technology, any transcriptional errors that may result from this process are unintentional.  Patient: Zachary Little Type: Established DOB: [birth year] MRN: [id number] PCP: Job Lukes, PA  Service: Procedure DOS: 12/15/2024 Setting: Ambulatory Location: Ambulatory outpatient facility Delivery: Face-to-face Provider: Wallie Sherry, MD Specialty: Interventional Pain Management Specialty designation: 09 Location: Outpatient facility Ref. Prov.: Job Lukes, [place]       Interventional Therapy   Primary Reason for Visit: Interventional Pain Management Treatment. CC: Thigh (Right)    Procedure:          Anesthesia, Analgesia, Anxiolysis:  Type: Right lateral femoral cutaneous nerve block Primary Purpose: Diagnostic Region: Groin Region, ASIS  Approach: Anterior approach Laterality: Right  Anesthesia: Local (1-2% Lidocaine )  Anxiolysis: None  Analgesia: None  Guidance: Ultrasound           Position: Supine    1. Meralgia paraesthetica, right   2. Right leg pain    NAS-11 Pain score:   Pre-procedure: 0-No pain/10   Post-procedure: 0-No pain/10     H&P (Pre-op Assessment):  Zachary Little is a 44 y.o. (year old), male patient, seen today for interventional treatment. He  has a past surgical history that includes Port-a-cath removal; Wisdom tooth extraction; and Cystectomy (Left). Zachary Little has a current medication list which includes the following prescription(s): diazepam , ibuprofen, levothyroxine , and NONFORMULARY OR COMPOUNDED ITEM. His primarily concern today is the Thigh (Right)  Initial Vital Signs:  Pulse/HCG Rate: 76ECG Heart Rate: 74 Temp: 98.2 F (36.8 C) Resp: 18 BP: 119/76 SpO2: 99  %  BMI: Estimated body mass index is 29.65 kg/m as calculated from the following:   Height as of this encounter: 5' 8 (1.727 m).   Weight as of this encounter: 195 lb (88.5 kg).  Risk Assessment: Allergies: Reviewed. He is allergic to topamax  [topiramate ] and trazodone  and nefazodone.  Allergy Precautions: None required Coagulopathies: Reviewed. None identified.  Blood-thinner therapy: None at this time Active Infection(s): Reviewed. None identified. Zachary Little is afebrile  Site Confirmation: Zachary Little was asked to confirm the procedure and laterality before marking the site Procedure checklist: Completed Consent: Before the procedure and under the influence of no sedative(s), amnesic(s), or anxiolytics, the patient was informed of the treatment options, risks and possible complications. To fulfill our ethical and legal obligations, as recommended by the American Medical Association's Code of Ethics, I have informed the patient of my clinical impression; the nature and purpose of the treatment or procedure; the risks, benefits, and possible complications of the intervention; the alternatives, including doing nothing; the risk(s) and benefit(s) of the alternative treatment(s) or procedure(s); and the risk(s) and benefit(s) of doing nothing. The patient was provided information about the general risks and possible complications associated with the procedure. These may include, but are not limited to: failure to achieve desired goals, infection, bleeding, organ or nerve damage, allergic reactions, paralysis, and death. In addition, the patient was informed of those risks and complications associated to the procedure, such as failure to decrease pain; infection; bleeding; organ or nerve damage with subsequent damage to sensory, motor, and/or autonomic systems, resulting in permanent pain, numbness, and/or weakness of one or several areas of the body; allergic reactions; (i.e.: anaphylactic  reaction); and/or death. Furthermore, the patient was informed of those risks and complications associated with the medications.  These include, but are not limited to: allergic reactions (i.e.: anaphylactic or anaphylactoid reaction(s)); adrenal axis suppression; blood sugar elevation that in diabetics may result in ketoacidosis or comma; water retention that in patients with history of congestive heart failure may result in shortness of breath, pulmonary edema, and decompensation with resultant heart failure; weight gain; swelling or edema; medication-induced neural toxicity; particulate matter embolism and blood vessel occlusion with resultant organ, and/or nervous system infarction; and/or aseptic necrosis of one or more joints. Finally, the patient was informed that Medicine is not an exact science; therefore, there is also the possibility of unforeseen or unpredictable risks and/or possible complications that may result in a catastrophic outcome. The patient indicated having understood very clearly. We have given the patient no guarantees and we have made no promises. Enough time was given to the patient to ask questions, all of which were answered to the patient's satisfaction. Zachary Little has indicated that he wanted to continue with the procedure. Attestation: I, the ordering provider, attest that I have discussed with the patient the benefits, risks, side-effects, alternatives, likelihood of achieving goals, and potential problems during recovery for the procedure that I have provided informed consent. Date  Time: 12/15/2024 10:07 AM  Pre-Procedure Preparation:  Monitoring: As per clinic protocol. Respiration, ETCO2, SpO2, BP, heart rate and rhythm monitor placed and checked for adequate function Safety Precautions: Patient was assessed for positional comfort and pressure points before starting the procedure. Time-out: I initiated and conducted the Time-out before starting the procedure, as per  protocol. The patient was asked to participate by confirming the accuracy of the Time Out information. Verification of the correct person, site, and procedure were performed and confirmed by me, the nursing staff, and the patient. Time-out conducted as per Joint Commission's Universal Protocol (UP.01.01.01). Time: 1040 Start Time: 1040 hrs.  Description of Procedure:          Area Prepped: Entire Anterolateral hip area. ChloraPrep (2% chlorhexidine  gluconate and 70% isopropyl alcohol) Safety Precautions: Aspiration looking for blood return was conducted prior to all injections. At no point did we inject any substances, as a needle was being advanced. No attempts were made at seeking any paresthesias. Safe injection practices and needle disposal techniques used. Medications properly checked for expiration dates. SDV (single dose vial) medications used.  Description of the Procedure (block of the LFCN) : The entire groin area and upper thigh was prepped and draped in the usual manner.  The lateral femoral cutaneous nerve was identified and between the tensor fascia lata and the sartorius and was located 1.5 cm medial and inferior to the anterior superior iliac spine parallel to the ilioinguinal ligament.  Under ultrasound guidance using in-plane technique, 5 cc solution made of 4 cc of 0.2% ropivacaine  1 cc of Decadron  10 mg/cc was injected for the right lateral femoral cutaneous nerve.   Vitals:   12/15/24 1011 12/15/24 1035 12/15/24 1045 12/15/24 1050  BP: 119/76 116/79 113/77 127/77  Pulse: 76     Resp: 18 16 16 17   Temp: 98.2 F (36.8 C)     TempSrc: Temporal     SpO2: 99% 100% 100% 100%  Weight: 195 lb (88.5 kg)     Height: 5' 8 (1.727 m)       Start Time: 1040 hrs. End Time: 1048 hrs.                Materials:  Needle(s) Type: Spinal Needle Gauge: 22G Length: 3.5-in Medication(s): Please see orders for  medications and dosing details.   Antibiotic Prophylaxis:    Anti-infectives (From admission, onward)    None      Indication(s): None identified  Post-operative Assessment:  Post-procedure Vital Signs:  Pulse/HCG Rate: 7680 Temp: 98.2 F (36.8 C) Resp: 17 BP: 127/77 SpO2: 100 %  EBL: None  Complications: No immediate post-treatment complications observed by team, or reported by patient.  Note: The patient tolerated the entire procedure well. A repeat set of vitals were taken after the procedure and the patient was kept under observation following institutional policy, for this type of procedure. Post-procedural neurological assessment was performed, showing return to baseline, prior to discharge. The patient was provided with post-procedure discharge instructions, including a section on how to identify potential problems. Should any problems arise concerning this procedure, the patient was given instructions to immediately contact us , at any time, without hesitation. In any case, we plan to contact the patient by telephone for a follow-up status report regarding this interventional procedure.  Comments:  No additional relevant information.  Plan of Care (POC)  Orders:  No orders of the defined types were placed in this encounter.   Medications ordered for procedure: Meds ordered this encounter  Medications   lidocaine  (XYLOCAINE ) 2 % (with pres) injection 400 mg   ropivacaine  (PF) 2 mg/mL (0.2%) (NAROPIN ) injection 9 mL   dexamethasone  (DECADRON ) injection 10 mg   Medications administered: We administered lidocaine , ropivacaine  (PF) 2 mg/mL (0.2%), and dexamethasone .  See the medical record for exact dosing, route, and time of administration.     Follow-up plan:   Return in about 2 weeks (around 12/29/2024) for PPE, VV. BL.     Recent Visits Date Type Provider Dept  12/13/24 Office Visit Marcelino Nurse, MD [facility]  Showing recent visits within past 90 days and meeting all other requirements Today's Visits Date  Type Provider Dept  12/15/24 Procedure visit Marcelino Nurse, MD [facility]  Showing today's visits and meeting all other requirements Future Appointments Date Type Provider Dept  12/30/24 Appointment Marcelino Nurse, MD [facility]  Showing future appointments within next 90 days and meeting all other requirements   Disposition: Discharge home  Discharge (Date  Time): 12/15/2024; 1054 hrs.   Primary Care Physician: Job Lukes, PA Location: [facility] Note by: Nurse Marcelino, MD (TTS technology used. I apologize for any typographical errors that were not detected and corrected.) Date: 12/15/2024; Time: 11:03 AM  Disclaimer:  Medicine is not an visual merchandiser. The only guarantee in medicine is that nothing is guaranteed. It is important to note that the decision to proceed with this intervention was based on the information collected from the patient. The Data and conclusions were drawn from the patient's questionnaire, the interview, and the physical examination. Because the information was provided in large part by the patient, it cannot be guaranteed that it has not been purposely or unconsciously manipulated. Every effort has been made to obtain as much relevant data as possible for this evaluation. It is important to note that the conclusions that lead to this procedure are derived in large part from the available data. Always take into account that the treatment will also be dependent on availability of resources and existing treatment guidelines, considered by other Pain Management Practitioners as being common knowledge and practice, at the time of the intervention. For Medico-Legal purposes, it is also important to point out that variation in procedural techniques and pharmacological choices are the acceptable norm.  The indications, contraindications, technique, and results of the above procedure should only be interpreted and judged by a  Board-Certified Interventional Pain Specialist with extensive familiarity and expertise in the same exact procedure and technique.

## 2024-12-15 NOTE — Patient Instructions (Signed)

## 2024-12-15 NOTE — Progress Notes (Signed)
 Safety precautions to be maintained throughout the outpatient stay will include: orient to surroundings, keep bed in low position, maintain call bell within reach at all times, provide assistance with transfer out of bed and ambulation.

## 2024-12-16 ENCOUNTER — Telehealth: Payer: Self-pay | Admitting: *Deleted

## 2024-12-16 NOTE — Telephone Encounter (Signed)
 Post procedure call; voicemail left

## 2024-12-30 ENCOUNTER — Ambulatory Visit: Admitting: Student in an Organized Health Care Education/Training Program

## 2024-12-31 ENCOUNTER — Ambulatory Visit (HOSPITAL_BASED_OUTPATIENT_CLINIC_OR_DEPARTMENT_OTHER): Payer: Self-pay | Admitting: Orthopaedic Surgery

## 2024-12-31 ENCOUNTER — Ambulatory Visit (HOSPITAL_BASED_OUTPATIENT_CLINIC_OR_DEPARTMENT_OTHER): Admitting: Orthopaedic Surgery

## 2024-12-31 ENCOUNTER — Other Ambulatory Visit (HOSPITAL_BASED_OUTPATIENT_CLINIC_OR_DEPARTMENT_OTHER): Payer: Self-pay | Admitting: Orthopaedic Surgery

## 2024-12-31 DIAGNOSIS — S43431S Superior glenoid labrum lesion of right shoulder, sequela: Secondary | ICD-10-CM

## 2024-12-31 NOTE — Progress Notes (Signed)
 "                                Post Operative Evaluation    Procedure/Date of Surgery: Left hip arthroscopy with labral repair and cam debridement 11/4  Presents today for follow-up of his right shoulder.  He has been experiencing persistent pain and giving way of the right shoulder.  This has been ongoing for the last several years.  He has trialed physical therapy without relief.  He has had an injection in the past without relief PMH/PSH/Family History/Social History/Meds/Allergies:    Past Medical History:  Diagnosis Date   Anxiety    Blood transfusion without reported diagnosis 2010   During Chemo Treatments   Hypothyroidism    Leukemia, acute myeloid, in remission (HCC) 2010   Thyroid  disease    Past Surgical History:  Procedure Laterality Date   CYSTECTOMY Left    Scrotum   PORT-A-CATH REMOVAL     WISDOM TOOTH EXTRACTION     Social History   Socioeconomic History   Marital status: Married    Spouse name: Not on file   Number of children: 0   Years of education: college   Highest education level: Bachelor's degree (e.g., BA, AB, BS)  Occupational History   Occupation: Disabled  Tobacco Use   Smoking status: Never   Smokeless tobacco: Never  Vaping Use   Vaping status: Never Used  Substance and Sexual Activity   Alcohol use: Yes    Comment: maybe one drink every two years   Drug use: No   Sexual activity: Yes    Partners: Female  Other Topics Concern   Not on file  Social History Narrative   Lives at home with his wife.   Left-handed.   No daily caffeine use.    Social Drivers of Health   Tobacco Use: Low Risk (12/15/2024)   Patient History    Smoking Tobacco Use: Never    Smokeless Tobacco Use: Never    Passive Exposure: Not on file  Financial Resource Strain: Low Risk  (08/30/2024)   Received from [facility]   Overall Financial Resource Strain (CARDIA)    Difficulty of Paying Living Expenses: Not hard at all  Food  Insecurity: No Food Insecurity (08/30/2024)   Received from [facility]   Epic    Within the past 12 months, you worried that your food would run out before you got the money to buy more.: Never true    Within the past 12 months, the food you bought just didn't last and you didn't have money to get more.: Never true  Transportation Needs: No Transportation Needs (08/30/2024)   Received from [facility] - Transportation    In the past 12 months, has lack of transportation kept you from medical appointments or from getting medications?: No    Lack of Transportation (Non-Medical): No  Physical Activity: Insufficiently Active (02/14/2023)   Exercise Vital Sign    Days of Exercise per Week: 3 days    Minutes of Exercise per Session: 30 min  Stress: No Stress Concern Present (02/14/2023)   [employer] - Occupational Stress Questionnaire    Feeling of Stress : Only a little  Social Connections: Unknown (02/14/2023)   Social Connection and Isolation Panel    Frequency of Communication with Friends and Family: Once a week  Frequency of Social Gatherings with Friends and Family: Patient declined    Attends Religious Services: Never    Database Administrator or Organizations: No    Attends Engineer, Structural: Not on file    Marital Status: Married  Depression (PHQ2-9): Low Risk (12/15/2024)   Depression (PHQ2-9)    PHQ-2 Score: 0  Alcohol Screen: Not on file  Housing: Low Risk  (08/30/2024)   Received from [facility]   Epic    In the last 12 months, was there a time when you were not able to pay the mortgage or rent on time?: No    In the past 12 months, how many times have you moved where you were living?: 0    At any time in the past 12 months, were you homeless or living in a shelter (including now)?: No  Utilities: Not At Risk (08/30/2024)   Received from [facility]    Epic    In the past 12 months has the electric, gas, oil, or water company threatened to shut off services in your home?: No  Health Literacy: Not on file   Family History  Problem Relation Age of Onset   Hypertension Mother    Hypertension Father    Multiple sclerosis Maternal Grandmother    Dementia Maternal Grandmother    Diabetes Maternal Grandfather    Heart attack Maternal Grandfather 92   Diabetes Paternal Grandfather    Thyroid  cancer Maternal Aunt    Prostate cancer Neg Hx    Colon cancer Neg Hx    Allergies  Allergen Reactions   Topamax  [Topiramate ] Other (See Comments)    Anger/irritable   Trazodone  And Nefazodone Nausea Only   Current Outpatient Medications  Medication Sig Dispense Refill   diazepam  (VALIUM ) 5 MG tablet Take 1 tablet (5 mg total) by mouth every 12 (twelve) hours as needed for muscle spasms 30 tablet 0   ibuprofen (ADVIL) 200 MG tablet Take 200-400 mg by mouth every 6 (six) hours as needed for moderate pain.     levothyroxine  (SYNTHROID ) 150 MCG tablet Take 1 tablet (150 mcg total) by mouth daily. 90 tablet 3   NONFORMULARY OR COMPOUNDED ITEM Take 1.5 mg by mouth once. Instructions: Take 1.5 mg by mouth once daily Med Name: Naltrexone  Start at 1.5 mg PO QHS X 1 week and then 3 mg PO QHS X 7 days and then 4.5 mg PO QHS (Patient not taking: Reported on 12/15/2024)     No current facility-administered medications for this visit.   No results found.  Review of Systems:   A ROS was performed including pertinent positives and negatives as documented in the HPI.   Musculoskeletal Exam:    There were no vitals taken for this visit.  Left hip with 30 degrees internal/external rotation of the hip without pain.  Active flexion of the left hip is to 90 degrees.  Mild Trendelenburg gait.  Walks with crutches.  Remainder of distal neurosensory exam is intact with 2+ dorsalis pedis pulse  Right shoulder with tenderness about the medial scapula without palpable  clicking.  There is scapular winging as he goes into forward elevation.  Negative O'Brien.  Forward elevation is to 170 degrees with external rotation to 50 degrees  Imaging:    MRI right shoulder: Inferior labral tearing posterior as well as superior labrum  I personally reviewed and interpreted the radiographs.   Assessment:   44 year old male with right shoulder pain  consistent with labral tearing.  At this time I did discuss treatment options.  He has trialed physical therapy as well as an injection in the past.  Has not come persistent relief from this.  Given this we did discuss possibility of right shoulder arthroscopy with labral repair.  I discussed risks and limitations well associated recovery timeframe.  After discussion he would like to proceed Plan :    - Plan for right shoulder arthroscopy with labral repair   After a lengthy discussion of treatment options, including risks, benefits, alternatives, complications of surgical and nonsurgical conservative options, the patient elected surgical repair.   The patient  is aware of the material risks  and complications including, but not limited to injury to adjacent structures, neurovascular injury, infection, numbness, bleeding, implant failure, thermal burns, stiffness, persistent pain, failure to heal, disease transmission from allograft, need for further surgery, dislocation, anesthetic risks, blood clots, risks of death,and others. The probabilities of surgical success and failure discussed with patient given their particular co-morbidities.The time and nature of expected rehabilitation and recovery was discussed.The patient's questions were all answered preoperatively.  No barriers to understanding were noted. I explained the natural history of the disease process and Rx rationale.  I explained to the patient what I considered to be reasonable expectations given their personal situation.  The final treatment plan was arrived at  through a shared patient decision making process model.     I personally saw and evaluated the patient, and participated in the management and treatment plan.  Elspeth Parker, MD Attending Physician, Orthopedic Surgery  This document was dictated using Dragon voice recognition software. A reasonable attempt at proof reading has been made to minimize errors. "

## 2025-01-06 ENCOUNTER — Ambulatory Visit: Admitting: Student in an Organized Health Care Education/Training Program

## 2025-01-12 ENCOUNTER — Ambulatory Visit (HOSPITAL_BASED_OUTPATIENT_CLINIC_OR_DEPARTMENT_OTHER): Admitting: Orthopaedic Surgery

## 2025-11-04 ENCOUNTER — Encounter: Admitting: Physician Assistant
